# Patient Record
Sex: Female | Born: 1980 | Hispanic: Yes | Marital: Married | Smoking: Former smoker
Health system: Southern US, Community
[De-identification: ages and names within clinical notes are randomized; demographics above are authoritative.]

## PROBLEM LIST (undated history)

## (undated) ENCOUNTER — Inpatient Hospital Stay (HOSPITAL_COMMUNITY): Payer: Self-pay

## (undated) DIAGNOSIS — K219 Gastro-esophageal reflux disease without esophagitis: Secondary | ICD-10-CM

## (undated) DIAGNOSIS — K829 Disease of gallbladder, unspecified: Secondary | ICD-10-CM

## (undated) DIAGNOSIS — G629 Polyneuropathy, unspecified: Secondary | ICD-10-CM

## (undated) DIAGNOSIS — Z973 Presence of spectacles and contact lenses: Secondary | ICD-10-CM

## (undated) DIAGNOSIS — R7303 Prediabetes: Secondary | ICD-10-CM

## (undated) DIAGNOSIS — T8859XA Other complications of anesthesia, initial encounter: Secondary | ICD-10-CM

## (undated) DIAGNOSIS — G4730 Sleep apnea, unspecified: Secondary | ICD-10-CM

## (undated) DIAGNOSIS — R600 Localized edema: Secondary | ICD-10-CM

## (undated) DIAGNOSIS — Z8619 Personal history of other infectious and parasitic diseases: Secondary | ICD-10-CM

## (undated) DIAGNOSIS — A609 Anogenital herpesviral infection, unspecified: Secondary | ICD-10-CM

## (undated) DIAGNOSIS — D649 Anemia, unspecified: Secondary | ICD-10-CM

## (undated) DIAGNOSIS — D582 Other hemoglobinopathies: Secondary | ICD-10-CM

## (undated) DIAGNOSIS — F419 Anxiety disorder, unspecified: Secondary | ICD-10-CM

## (undated) DIAGNOSIS — I499 Cardiac arrhythmia, unspecified: Secondary | ICD-10-CM

## (undated) DIAGNOSIS — Z3490 Encounter for supervision of normal pregnancy, unspecified, unspecified trimester: Secondary | ICD-10-CM

## (undated) HISTORY — DX: Anogenital herpesviral infection, unspecified: A60.9

## (undated) HISTORY — PX: CARPAL TUNNEL RELEASE: SHX101

## (undated) HISTORY — DX: Other hemoglobinopathies: D58.2

## (undated) HISTORY — PX: UPPER GI ENDOSCOPY: SHX6162

## (undated) HISTORY — PX: LAPAROSCOPIC GASTRIC BANDING: SHX1100

## (undated) HISTORY — PX: COLONOSCOPY: SHX174

## (undated) HISTORY — DX: Polyneuropathy, unspecified: G62.9

## (undated) HISTORY — DX: Gastro-esophageal reflux disease without esophagitis: K21.9

## (undated) HISTORY — PX: WISDOM TOOTH EXTRACTION: SHX21

## (undated) HISTORY — DX: Personal history of other infectious and parasitic diseases: Z86.19

---

## 2001-03-26 ENCOUNTER — Other Ambulatory Visit: Admission: RE | Admit: 2001-03-26 | Discharge: 2001-03-26 | Payer: Self-pay | Admitting: Gynecology

## 2002-04-07 ENCOUNTER — Other Ambulatory Visit: Admission: RE | Admit: 2002-04-07 | Discharge: 2002-04-07 | Payer: Self-pay | Admitting: Obstetrics and Gynecology

## 2002-07-07 ENCOUNTER — Encounter: Payer: Self-pay | Admitting: Rheumatology

## 2002-07-07 ENCOUNTER — Encounter: Admission: RE | Admit: 2002-07-07 | Discharge: 2002-07-07 | Payer: Self-pay | Admitting: Rheumatology

## 2008-08-20 ENCOUNTER — Emergency Department (HOSPITAL_COMMUNITY): Admission: EM | Admit: 2008-08-20 | Discharge: 2008-08-20 | Payer: Self-pay | Admitting: Emergency Medicine

## 2009-05-02 ENCOUNTER — Emergency Department (HOSPITAL_COMMUNITY): Admission: EM | Admit: 2009-05-02 | Discharge: 2009-05-02 | Payer: Self-pay | Admitting: Family Medicine

## 2010-08-17 ENCOUNTER — Encounter
Admission: RE | Admit: 2010-08-17 | Discharge: 2010-08-17 | Payer: Self-pay | Source: Home / Self Care | Attending: Family Medicine | Admitting: Family Medicine

## 2010-11-23 ENCOUNTER — Other Ambulatory Visit (HOSPITAL_COMMUNITY): Payer: Self-pay | Admitting: Gastroenterology

## 2010-11-23 DIAGNOSIS — R1013 Epigastric pain: Secondary | ICD-10-CM

## 2010-12-07 ENCOUNTER — Ambulatory Visit (HOSPITAL_COMMUNITY)
Admission: RE | Admit: 2010-12-07 | Discharge: 2010-12-07 | Disposition: A | Discharge status: Home / Self Care | Payer: BC Managed Care – PPO | Source: Ambulatory Visit | Attending: Gastroenterology | Admitting: Gastroenterology

## 2010-12-07 ENCOUNTER — Other Ambulatory Visit (HOSPITAL_COMMUNITY): Payer: Self-pay | Admitting: Urology

## 2010-12-07 ENCOUNTER — Other Ambulatory Visit (HOSPITAL_COMMUNITY): Payer: Self-pay | Admitting: Gastroenterology

## 2010-12-07 ENCOUNTER — Encounter (HOSPITAL_COMMUNITY)
Admission: RE | Admit: 2010-12-07 | Discharge: 2010-12-07 | Disposition: A | Discharge status: Home / Self Care | Payer: BC Managed Care – PPO | Source: Ambulatory Visit | Attending: Gastroenterology | Admitting: Gastroenterology

## 2010-12-07 ENCOUNTER — Inpatient Hospital Stay (HOSPITAL_COMMUNITY): Admission: RE | Admit: 2010-12-07 | Payer: Self-pay | Source: Ambulatory Visit

## 2010-12-07 ENCOUNTER — Other Ambulatory Visit (HOSPITAL_COMMUNITY): Payer: Self-pay

## 2010-12-07 DIAGNOSIS — R1013 Epigastric pain: Secondary | ICD-10-CM

## 2010-12-07 DIAGNOSIS — R143 Flatulence: Secondary | ICD-10-CM | POA: Insufficient documentation

## 2010-12-07 DIAGNOSIS — R142 Eructation: Secondary | ICD-10-CM | POA: Insufficient documentation

## 2010-12-07 DIAGNOSIS — R141 Gas pain: Secondary | ICD-10-CM | POA: Insufficient documentation

## 2010-12-07 MED ORDER — SINCALIDE 5 MCG IJ SOLR: 0.0200 ug/kg | Freq: Once | INTRAMUSCULAR | Status: DC

## 2010-12-07 MED ORDER — TECHNETIUM TC 99M MEBROFENIN IV KIT
5.5000 | PACK | Freq: Once | INTRAVENOUS | Status: AC | PRN
  Administered 2010-12-07: 6 via INTRAVENOUS

## 2011-04-03 ENCOUNTER — Encounter: Payer: Self-pay | Admitting: Endocrinology

## 2011-04-03 ENCOUNTER — Ambulatory Visit (INDEPENDENT_AMBULATORY_CARE_PROVIDER_SITE_OTHER): Payer: BC Managed Care – PPO | Admitting: Endocrinology

## 2011-04-03 DIAGNOSIS — R002 Palpitations: Secondary | ICD-10-CM | POA: Insufficient documentation

## 2011-04-03 DIAGNOSIS — R739 Hyperglycemia, unspecified: Secondary | ICD-10-CM

## 2011-04-03 DIAGNOSIS — R7309 Other abnormal glucose: Secondary | ICD-10-CM

## 2011-04-03 MED ORDER — METFORMIN HCL ER 500 MG PO TB24: 500.0000 mg | ORAL_TABLET | Freq: Every day | ORAL | Status: DC

## 2011-04-03 NOTE — Patient Instructions (Addendum)
blood tests are being ordered for you today.  please call [phone] to hear your test results.  You will be prompted to enter the 9-digit "MRN" number that appears at the top left of this page, followed by #.  Then you will hear the message.  i agree with your plan for weight-loss surgery in [place].   i have sent a prescription to your pharmacy for "metformin." Please make a follow-up appointment in 6 months. CC michelle bigelman (update: i left message on phone-tree:  rx as we discussed)

## 2011-04-03 NOTE — Progress Notes (Signed)
  Subjective:    Patient ID: Karina [name], female    DOB: 1981-02-26, 30 y.o.   MRN: [id number]  HPI Pt says a recent a1c was 6.2%, 6 mos ago.  She says a repeat, 3 mos ago, was in the 5's. She reports few mos of slight weakness throughout the body, and assoc dizziness. Past Medical History  Diagnosis Date  . Asthma   . GERD (gastroesophageal reflux disease)   . Chronic bronchitis   . History of chicken pox     Past Surgical History  Procedure Date  . Carpal tunnel release 2008, 2010    History   Social History  . Marital Status: Divorced    Spouse Name: N/A    Number of Children: N/A  . Years of Education: college   Occupational History  . Not on file.   Social History Main Topics  . Smoking status: Former Games developer  . Smokeless tobacco: Not on file  . Alcohol Use: Yes  . Drug Use: No  . Sexually Active: Not on file   Other Topics Concern  . Not on file   Social History Narrative   PCP-Dr. Deirdre Priest in [place]   No current outpatient prescriptions on file prior to visit.   No Known Allergies  Family History  Problem Relation Age of Onset  . Diabetes Sister   . Heart disease Other   . Hyperlipidemia Other   . Hypertension Other   . Stroke Other   . Kidney disease Other   . Thyroid disease Other     BP 124/74  Pulse 81  Temp(Src) 98.8 F (37.1 C) (Oral)  Ht 5\' 5"  (1.651 m)  Wt 292 lb 1.9 oz (132.505 kg)  BMI 48.61 kg/m2  SpO2 95%  LMP 03/07/2011  Review of Systems denies blurry vision, chest pain, sob, n/v, urinary frequency, memory loss, depression, and hypoglycemia.  She has excessive diaphoresis, palpitations, excessive hair growth, weight gain, leg cramps, easy bruising, rhinorrhea, and headache.  She reports reg menses    Objective:   Physical Exam VS: see vs page GEN: no distress.  obese HEAD: head: no deformity eyes: no periorbital swelling, no proptosis external nose and ears are normal mouth: no lesion seen NECK: supple, thyroid is not  enlarged CHEST WALL: no deformity CV: reg rate and rhythm, no murmur ABD: abdomen is soft, nontender.  no hepatosplenomegaly.  not distended.  no hernia MUSCULOSKELETAL: muscle bulk and strength are grossly normal.  no obvious joint swelling.  gait is normal and steady EXTEMITIES: no deformity.  no ulcer on the feet.  feet are of normal color and temp.  Trace bilat leg edema PULSES: dorsalis pedis intact bilat.  no carotid bruit NEURO:  cn 2-12 grossly intact.   readily moves all 4's.  sensation is intact to touch on the feet SKIN:  Normal texture and temperature.  No rash or suspicious lesion is visible.  There is hirsutism on the face and anterior chest.   NODES:  None palpable at the neck. PSYCH: alert, oriented x3.  Does not appear anxious nor depressed.      Assessment & Plan:  Hyperglycemia.  She it at approx 10% annual risk of developing dm Palpitations, uncertain etiology.  New.  She is at risk for hyperthyroidism. Weakness, uncertain etiology

## 2011-04-06 ENCOUNTER — Other Ambulatory Visit (INDEPENDENT_AMBULATORY_CARE_PROVIDER_SITE_OTHER): Payer: BC Managed Care – PPO

## 2011-04-06 DIAGNOSIS — R002 Palpitations: Secondary | ICD-10-CM

## 2011-04-06 DIAGNOSIS — R7309 Other abnormal glucose: Secondary | ICD-10-CM

## 2011-04-06 DIAGNOSIS — R739 Hyperglycemia, unspecified: Secondary | ICD-10-CM

## 2011-04-06 LAB — HEMOGLOBIN A1C: Hgb A1c MFr Bld: 5.4 % (ref 4.6–6.5)

## 2011-06-01 ENCOUNTER — Ambulatory Visit: Payer: BC Managed Care – PPO | Admitting: Endocrinology

## 2011-06-11 ENCOUNTER — Other Ambulatory Visit (INDEPENDENT_AMBULATORY_CARE_PROVIDER_SITE_OTHER): Payer: BC Managed Care – PPO

## 2011-06-11 ENCOUNTER — Encounter: Payer: Self-pay | Admitting: Endocrinology

## 2011-06-11 ENCOUNTER — Ambulatory Visit (INDEPENDENT_AMBULATORY_CARE_PROVIDER_SITE_OTHER): Payer: BC Managed Care – PPO | Admitting: Endocrinology

## 2011-06-11 DIAGNOSIS — Z9884 Bariatric surgery status: Secondary | ICD-10-CM

## 2011-06-11 DIAGNOSIS — R739 Hyperglycemia, unspecified: Secondary | ICD-10-CM

## 2011-06-11 DIAGNOSIS — R002 Palpitations: Secondary | ICD-10-CM

## 2011-06-11 DIAGNOSIS — Z0000 Encounter for general adult medical examination without abnormal findings: Secondary | ICD-10-CM

## 2011-06-11 DIAGNOSIS — R7309 Other abnormal glucose: Secondary | ICD-10-CM

## 2011-06-11 LAB — CBC WITH DIFFERENTIAL/PLATELET
Basophils Relative: 0.6 % (ref 0.0–3.0)
Eosinophils Absolute: 0.7 10*3/uL (ref 0.0–0.7)
Hemoglobin: 12 g/dL (ref 12.0–15.0)
Lymphocytes Relative: 24.9 % (ref 12.0–46.0)
MCHC: 31.8 g/dL (ref 30.0–36.0)
Monocytes Relative: 5.9 % (ref 3.0–12.0)
Neutro Abs: 7.1 10*3/uL (ref 1.4–7.7)
RBC: 5.59 Mil/uL — ABNORMAL HIGH (ref 3.87–5.11)

## 2011-06-11 LAB — HEPATIC FUNCTION PANEL
AST: 22 U/L (ref 0–37)
Albumin: 3.7 g/dL (ref 3.5–5.2)
Alkaline Phosphatase: 108 U/L (ref 39–117)
Total Protein: 8.3 g/dL (ref 6.0–8.3)

## 2011-06-11 LAB — BASIC METABOLIC PANEL
Calcium: 8.7 mg/dL (ref 8.4–10.5)
Creatinine, Ser: 0.7 mg/dL (ref 0.4–1.2)
GFR: 96.58 mL/min (ref >60.00)

## 2011-06-11 LAB — VITAMIN B12: Vitamin B-12: 347 pg/mL (ref 211–911)

## 2011-06-11 LAB — MAGNESIUM: Magnesium: 2 mg/dL (ref 1.5–2.5)

## 2011-06-11 NOTE — Progress Notes (Signed)
  Subjective:    Patient ID: Karina [name], female    DOB: 07-Apr-1981, 30 y.o.   MRN: [id number]  HPI Pt is working towards gastric banding, which will be done in [place].  Pt says she got shaky on the metformin.  cbg was in the 60's with these sxs.  They resolved with eating.  Past Medical History  Diagnosis Date  . Asthma   . GERD (gastroesophageal reflux disease)   . Chronic bronchitis   . History of chicken pox     Past Surgical History  Procedure Date  . Carpal tunnel release 2008, 2010    History   Social History  . Marital Status: Divorced    Spouse Name: N/A    Number of Children: N/A  . Years of Education: college   Occupational History  . Not on file.   Social History Main Topics  . Smoking status: Former Games developer  . Smokeless tobacco: Not on file  . Alcohol Use: Yes  . Drug Use: No  . Sexually Active: Not on file   Other Topics Concern  . Not on file   Social History Narrative   PCP-Dr. Deirdre Priest in [place]    Current Outpatient Prescriptions on File Prior to Visit  Medication Sig Dispense Refill  . albuterol (PROVENTIL,VENTOLIN) 90 MCG/ACT inhaler Inhale 2 puffs into the lungs as needed.        . carisoprodol (SOMA) 250 MG tablet Take 350 mg by mouth as needed.        [name] esomeprazole (NEXIUM) 40 MG capsule Take 40 mg by mouth daily before breakfast.        . furosemide (LASIX) 40 MG tablet Take 40 mg by mouth 2 (two) times daily.        [name] ibuprofen (ADVIL,MOTRIN) 600 MG tablet Take 600 mg by mouth 3 (three) times daily as needed.        . iron polysaccharides (NIFEREX 60) 40-20 MG capsule Take 1 capsule by mouth daily.        . NON FORMULARY CPAP machine         No Known Allergies  Family History  Problem Relation Age of Onset  . Diabetes Sister   . Heart disease Other   . Hyperlipidemia Other   . Hypertension Other   . Stroke Other   . Kidney disease Other   . Thyroid disease Other     BP 118/78  Pulse 72  Temp(Src) 98.3 F (36.8 C)  (Oral)  Ht 5\' 5"  (1.651 m)  Wt 291 lb 9.6 oz (132.269 kg)  BMI 48.52 kg/m2  SpO2 98%  LMP 05/25/2011  Review of Systems Denies sob    Objective:   Physical Exam VITAL SIGNS:  See vs page GENERAL: no distress.  Obese Ext: no edema.     Assessment & Plan:  Hyperglycemia.  She did not tolerate metformin.  i agree with her plan for gastric banding.

## 2011-06-11 NOTE — Patient Instructions (Addendum)
i agree with your plan for weight-loss surgery in [place].   You can stop the metformin.   Please return here after your surgery. blood tests are being requested for you today.  please call [phone] to hear your test results.  You will be prompted to enter the 9-digit "MRN" number that appears at the top left of this page, followed by #.  Then you will hear the message.

## 2011-07-27 ENCOUNTER — Other Ambulatory Visit: Payer: Self-pay

## 2011-07-27 DIAGNOSIS — M79642 Pain in left hand: Secondary | ICD-10-CM

## 2011-07-27 DIAGNOSIS — M25532 Pain in left wrist: Secondary | ICD-10-CM

## 2011-08-01 ENCOUNTER — Inpatient Hospital Stay: Admission: RE | Admit: 2011-08-01 | Payer: BC Managed Care – PPO | Source: Ambulatory Visit

## 2011-08-01 ENCOUNTER — Inpatient Hospital Stay
Admission: RE | Admit: 2011-08-01 | Discharge: 2011-08-01 | Disposition: A | Discharge status: Home / Self Care | Payer: BC Managed Care – PPO | Source: Ambulatory Visit

## 2011-08-20 ENCOUNTER — Other Ambulatory Visit: Payer: BC Managed Care – PPO

## 2011-08-20 ENCOUNTER — Ambulatory Visit
Admission: RE | Admit: 2011-08-20 | Discharge: 2011-08-20 | Disposition: A | Discharge status: Home / Self Care | Payer: BC Managed Care – PPO | Source: Ambulatory Visit

## 2011-08-20 DIAGNOSIS — M25532 Pain in left wrist: Secondary | ICD-10-CM

## 2011-08-20 DIAGNOSIS — M79642 Pain in left hand: Secondary | ICD-10-CM

## 2011-08-29 ENCOUNTER — Telehealth: Payer: Self-pay | Admitting: *Deleted

## 2011-08-29 NOTE — Telephone Encounter (Signed)
Left message for pt informing of MD's advisement and to callback office.

## 2011-08-29 NOTE — Telephone Encounter (Signed)
Pt called regarding letter that was written by MD for lap band surgery in October. Pt states that she needs another letter stating that her blood sugars are well controlled, that she is currently taking Metformin to help with BS and the results of her last A1c test. (Letter needs to be faxed to Dr. Jones Skene'  In [place] fax [phone] office # [phone])

## 2011-08-29 NOTE — Telephone Encounter (Signed)
Last a1c was 5 mos ago.  Options are: Letter refers to that date, or: Come in for another a1c

## 2011-08-30 DIAGNOSIS — M958 Other specified acquired deformities of musculoskeletal system: Secondary | ICD-10-CM | POA: Insufficient documentation

## 2011-08-30 DIAGNOSIS — G8929 Other chronic pain: Secondary | ICD-10-CM | POA: Insufficient documentation

## 2011-08-30 NOTE — Telephone Encounter (Signed)
Pt states that she has had a recent A1c done by her GYN. She will fax results to office for MD to review and place into letter.

## 2011-09-14 NOTE — Telephone Encounter (Signed)
ok 

## 2011-09-15 ENCOUNTER — Encounter: Payer: Self-pay | Admitting: Endocrinology

## 2011-10-12 ENCOUNTER — Ambulatory Visit
Admission: RE | Admit: 2011-10-12 | Discharge: 2011-10-12 | Disposition: A | Discharge status: Home / Self Care | Payer: BC Managed Care – PPO | Source: Ambulatory Visit

## 2011-10-12 DIAGNOSIS — M79642 Pain in left hand: Secondary | ICD-10-CM

## 2011-10-12 DIAGNOSIS — M25532 Pain in left wrist: Secondary | ICD-10-CM

## 2011-10-24 ENCOUNTER — Other Ambulatory Visit: Payer: Self-pay | Admitting: Orthopedic Surgery

## 2011-10-24 DIAGNOSIS — M79642 Pain in left hand: Secondary | ICD-10-CM

## 2011-10-24 DIAGNOSIS — M25532 Pain in left wrist: Secondary | ICD-10-CM

## 2011-12-06 ENCOUNTER — Encounter (INDEPENDENT_AMBULATORY_CARE_PROVIDER_SITE_OTHER): Payer: Self-pay | Admitting: General Surgery

## 2011-12-06 ENCOUNTER — Ambulatory Visit (INDEPENDENT_AMBULATORY_CARE_PROVIDER_SITE_OTHER): Payer: BC Managed Care – PPO | Admitting: General Surgery

## 2011-12-06 VITALS — BP 122/84 | HR 76 | Ht 65.0 in | Wt 291.6 lb

## 2011-12-06 DIAGNOSIS — K828 Other specified diseases of gallbladder: Secondary | ICD-10-CM | POA: Insufficient documentation

## 2011-12-06 NOTE — Progress Notes (Signed)
HPI The patient is here for evaluation of biliary dyskinesia. She was seen back in April 2012. That time we were planning on performing a laparoscopic cholecystectomy however she wanted a simultaneous bariatric procedure. That was arranged but subsequently counseled and now she is coming back here for reevaluation for laparoscopic cholecystectomy  PE Her lung examination is clear to auscultation.  Cardiac exam regular rhythm and rate no murmurs gallops or surgeries.  Abdominal exam mild palpable tenderness in right upper quadrant and the epigastrium.  Assessment:  Symptomatic biliary dyskinesia.  Plan:.  Laparoscopic cholecystectomy with cholangiogram. The patient understands risks and then finished and wishes to proceed [] 

## 2012-01-04 ENCOUNTER — Encounter (HOSPITAL_COMMUNITY): Payer: Self-pay

## 2012-01-04 ENCOUNTER — Encounter (HOSPITAL_COMMUNITY)
Admission: RE | Admit: 2012-01-04 | Discharge: 2012-01-04 | Disposition: A | Discharge status: Home / Self Care | Payer: BC Managed Care – PPO | Source: Ambulatory Visit | Attending: General Surgery | Admitting: General Surgery

## 2012-01-04 HISTORY — DX: Localized edema: R60.0

## 2012-01-04 HISTORY — DX: Sleep apnea, unspecified: G47.30

## 2012-01-04 HISTORY — DX: Anemia, unspecified: D64.9

## 2012-01-04 LAB — BASIC METABOLIC PANEL
CO2: 25 mEq/L (ref 19–32)
Chloride: 103 mEq/L (ref 96–112)
Glucose, Bld: 86 mg/dL (ref 70–99)
Potassium: 4 mEq/L (ref 3.5–5.1)
Sodium: 136 mEq/L (ref 135–145)

## 2012-01-04 LAB — HCG, SERUM, QUALITATIVE: Preg, Serum: NEGATIVE

## 2012-01-04 LAB — SURGICAL PCR SCREEN
MRSA, PCR: NEGATIVE
Staphylococcus aureus: NEGATIVE

## 2012-01-04 LAB — CBC
HCT: 33.4 % — ABNORMAL LOW (ref 36.0–46.0)
Hemoglobin: 11.5 g/dL — ABNORMAL LOW (ref 12.0–15.0)
RBC: 5.33 MIL/uL — ABNORMAL HIGH (ref 3.87–5.11)
WBC: 12.8 10*3/uL — ABNORMAL HIGH (ref 4.0–10.5)

## 2012-01-04 NOTE — Pre-Procedure Instructions (Signed)
[address]  01/04/2012   Your procedure is scheduled on:  May 3,2013 @ 0930  Report to [facility] at 0730 AM.  Call this number if you have problems the morning of surgery: [phone]   Remember:   Do not eat food:After Midnight.  May have clear liquids: up to 4 Hours before arrival.  Clear liquids include soda, tea, black coffee, apple or grape juice, broth, water.  Take these medicines the morning of surgery with A SIP OF WATER: carafate, nexium, albuterol, soma (if needed)   Do not wear jewelry, make-up or nail polish.  Do not wear lotions, powders, or perfumes.  Do not shave 48 hours prior to surgery.  Do not bring valuables to the hospital.  Contacts, dentures or bridgework may not be worn into surgery.  Leave suitcase in the car. After surgery it may be brought to your room.  For patients admitted to the hospital, checkout time is 11:00 AM the day of discharge.   Patients discharged the day of surgery will not be allowed to drive home.    Special Instructions: CHG Shower Use Special Wash: 1/2 bottle night before surgery and 1/2 bottle morning of surgery.   Please read over the following fact sheets that you were given: Pain Booklet, Coughing and Deep Breathing, MRSA Information and Surgical Site Infection Prevention

## 2012-01-04 NOTE — Progress Notes (Addendum)
Medical physican Dr. Everardo All Ginette Otto Stress test, echo, ekg august 2012 - Dr. Josefine Class in [place] - requesting records Patient had tick bite April 22; on doxycycline prescription for this.

## 2012-01-09 ENCOUNTER — Telehealth (INDEPENDENT_AMBULATORY_CARE_PROVIDER_SITE_OTHER): Payer: Self-pay | Admitting: General Surgery

## 2012-01-10 ENCOUNTER — Encounter (INDEPENDENT_AMBULATORY_CARE_PROVIDER_SITE_OTHER): Payer: Self-pay

## 2012-01-10 MED ORDER — DEXTROSE 5 % IV SOLN
2.0000 g | INTRAVENOUS | Status: AC
  Administered 2012-01-11: 2 g via INTRAVENOUS
  Filled 2012-01-10: qty 2

## 2012-01-11 ENCOUNTER — Ambulatory Visit (HOSPITAL_COMMUNITY): Payer: BC Managed Care – PPO | Admitting: Anesthesiology

## 2012-01-11 ENCOUNTER — Encounter (HOSPITAL_COMMUNITY)
Admission: RE | Disposition: A | Discharge status: Home / Self Care | Payer: Self-pay | Source: Ambulatory Visit | Attending: General Surgery

## 2012-01-11 ENCOUNTER — Ambulatory Visit (HOSPITAL_COMMUNITY)
Admission: RE | Admit: 2012-01-11 | Discharge: 2012-01-11 | Disposition: A | Discharge status: Home / Self Care | Payer: BC Managed Care – PPO | Source: Ambulatory Visit | Attending: General Surgery | Admitting: General Surgery

## 2012-01-11 ENCOUNTER — Encounter (HOSPITAL_COMMUNITY): Payer: Self-pay | Admitting: *Deleted

## 2012-01-11 ENCOUNTER — Ambulatory Visit (HOSPITAL_COMMUNITY): Payer: BC Managed Care – PPO

## 2012-01-11 ENCOUNTER — Encounter (HOSPITAL_COMMUNITY): Payer: Self-pay | Admitting: Anesthesiology

## 2012-01-11 DIAGNOSIS — Z01812 Encounter for preprocedural laboratory examination: Secondary | ICD-10-CM | POA: Insufficient documentation

## 2012-01-11 DIAGNOSIS — K811 Chronic cholecystitis: Secondary | ICD-10-CM

## 2012-01-11 DIAGNOSIS — K828 Other specified diseases of gallbladder: Secondary | ICD-10-CM

## 2012-01-11 HISTORY — DX: Disease of gallbladder, unspecified: K82.9

## 2012-01-11 HISTORY — PX: CHOLECYSTECTOMY: SHX55

## 2012-01-11 LAB — GLUCOSE, CAPILLARY: Glucose-Capillary: 80 mg/dL (ref 70–99)

## 2012-01-11 SURGERY — LAPAROSCOPIC CHOLECYSTECTOMY WITH INTRAOPERATIVE CHOLANGIOGRAM
Anesthesia: General | Site: Abdomen | Wound class: Contaminated

## 2012-01-11 MED ORDER — 0.9 % SODIUM CHLORIDE (POUR BTL) OPTIME
TOPICAL | Status: DC | PRN
  Administered 2012-01-11: 1000 mL

## 2012-01-11 MED ORDER — BUPIVACAINE-EPINEPHRINE 0.25% -1:200000 IJ SOLN
INTRAMUSCULAR | Status: DC | PRN
  Administered 2012-01-11: 20 mL

## 2012-01-11 MED ORDER — NEOSTIGMINE METHYLSULFATE 1 MG/ML IJ SOLN
INTRAMUSCULAR | Status: DC | PRN
  Administered 2012-01-11: 4 mg via INTRAVENOUS

## 2012-01-11 MED ORDER — SODIUM CHLORIDE 0.9 % IV SOLN
INTRAVENOUS | Status: DC | PRN
  Administered 2012-01-11: 10:00:00

## 2012-01-11 MED ORDER — ONDANSETRON HCL 4 MG/2ML IJ SOLN: 4.0000 mg | Freq: Once | INTRAMUSCULAR | Status: DC | PRN

## 2012-01-11 MED ORDER — MIDAZOLAM HCL 5 MG/5ML IJ SOLN
INTRAMUSCULAR | Status: DC | PRN
  Administered 2012-01-11: 2 mg via INTRAVENOUS

## 2012-01-11 MED ORDER — LACTATED RINGERS IV SOLN
INTRAVENOUS | Status: DC
  Administered 2012-01-11 (×3): via INTRAVENOUS

## 2012-01-11 MED ORDER — FENTANYL CITRATE 0.05 MG/ML IJ SOLN
INTRAMUSCULAR | Status: DC | PRN
  Administered 2012-01-11 (×2): 50 ug via INTRAVENOUS
  Administered 2012-01-11: 100 ug via INTRAVENOUS
  Administered 2012-01-11: 50 ug via INTRAVENOUS

## 2012-01-11 MED ORDER — SODIUM CHLORIDE 0.9 % IR SOLN
Status: DC | PRN
  Administered 2012-01-11: 1

## 2012-01-11 MED ORDER — HYDROCODONE-ACETAMINOPHEN 5-325 MG PO TABS
1.0000 | ORAL_TABLET | Freq: Once | ORAL | Status: AC
  Administered 2012-01-11: 2 via ORAL
  Filled 2012-01-11: qty 2

## 2012-01-11 MED ORDER — HYDROCODONE-ACETAMINOPHEN 5-500 MG PO TABS: 1.0000 | ORAL_TABLET | Freq: Four times a day (QID) | ORAL | Status: DC | PRN

## 2012-01-11 MED ORDER — PROPOFOL 10 MG/ML IV EMUL
INTRAVENOUS | Status: DC | PRN
  Administered 2012-01-11: 200 mg via INTRAVENOUS

## 2012-01-11 MED ORDER — HYDROMORPHONE HCL PF 1 MG/ML IJ SOLN
0.2500 mg | INTRAMUSCULAR | Status: DC | PRN
  Administered 2012-01-11 (×4): 0.5 mg via INTRAVENOUS

## 2012-01-11 MED ORDER — DEXAMETHASONE SODIUM PHOSPHATE 4 MG/ML IJ SOLN
INTRAMUSCULAR | Status: DC | PRN
  Administered 2012-01-11: 8 mg via INTRAVENOUS

## 2012-01-11 MED ORDER — ROCURONIUM BROMIDE 100 MG/10ML IV SOLN
INTRAVENOUS | Status: DC | PRN
  Administered 2012-01-11: 50 mg via INTRAVENOUS
  Administered 2012-01-11: 10 mg via INTRAVENOUS

## 2012-01-11 MED ORDER — ONDANSETRON HCL 4 MG/2ML IJ SOLN
INTRAMUSCULAR | Status: DC | PRN
  Administered 2012-01-11: 4 mg via INTRAVENOUS

## 2012-01-11 MED ORDER — GLYCOPYRROLATE 0.2 MG/ML IJ SOLN
INTRAMUSCULAR | Status: DC | PRN
  Administered 2012-01-11: .8 mg via INTRAVENOUS

## 2012-01-11 MED ORDER — HYDROMORPHONE HCL PF 1 MG/ML IJ SOLN
INTRAMUSCULAR | Status: AC
  Filled 2012-01-11: qty 1

## 2012-01-11 SURGICAL SUPPLY — 45 items
APPLIER CLIP 5 13 M/L LIGAMAX5 (MISCELLANEOUS) ×2
APPLIER CLIP ROT 10 11.4 M/L (STAPLE)
BLADE SURG ROTATE 9660 (MISCELLANEOUS) IMPLANT
CANISTER SUCTION 2500CC (MISCELLANEOUS) ×2 IMPLANT
CHLORAPREP W/TINT 26ML (MISCELLANEOUS) ×2 IMPLANT
CLIP APPLIE 5 13 M/L LIGAMAX5 (MISCELLANEOUS) ×1 IMPLANT
CLIP APPLIE ROT 10 11.4 M/L (STAPLE) IMPLANT
CLOTH BEACON ORANGE TIMEOUT ST (SAFETY) ×2 IMPLANT
COVER MAYO STAND STRL (DRAPES) ×2 IMPLANT
COVER SURGICAL LIGHT HANDLE (MISCELLANEOUS) ×2 IMPLANT
DECANTER SPIKE VIAL GLASS SM (MISCELLANEOUS) IMPLANT
DERMABOND ADVANCED (GAUZE/BANDAGES/DRESSINGS) ×1
DERMABOND ADVANCED .7 DNX12 (GAUZE/BANDAGES/DRESSINGS) ×1 IMPLANT
DRAPE C-ARM 42X72 X-RAY (DRAPES) ×2 IMPLANT
DRAPE UTILITY 15X26 W/TAPE STR (DRAPE) ×4 IMPLANT
DRSG TEGADERM 4X4.75 (GAUZE/BANDAGES/DRESSINGS) ×2 IMPLANT
ELECT REM PT RETURN 9FT ADLT (ELECTROSURGICAL) ×2
ELECTRODE REM PT RTRN 9FT ADLT (ELECTROSURGICAL) ×1 IMPLANT
GLOVE BIOGEL PI IND STRL 7.0 (GLOVE) ×2 IMPLANT
GLOVE BIOGEL PI IND STRL 8 (GLOVE) ×1 IMPLANT
GLOVE BIOGEL PI INDICATOR 7.0 (GLOVE) ×2
GLOVE BIOGEL PI INDICATOR 8 (GLOVE) ×1
GLOVE ECLIPSE 6.5 STRL STRAW (GLOVE) ×2 IMPLANT
GLOVE ECLIPSE 7.5 STRL STRAW (GLOVE) ×2 IMPLANT
GLOVE SURG SS PI 7.0 STRL IVOR (GLOVE) ×4 IMPLANT
GOWN STRL NON-REIN LRG LVL3 (GOWN DISPOSABLE) ×6 IMPLANT
KIT BASIN OR (CUSTOM PROCEDURE TRAY) ×2 IMPLANT
KIT ROOM TURNOVER OR (KITS) ×2 IMPLANT
NS IRRIG 1000ML POUR BTL (IV SOLUTION) ×2 IMPLANT
PAD ARMBOARD 7.5X6 YLW CONV (MISCELLANEOUS) ×4 IMPLANT
POUCH SPECIMEN RETRIEVAL 10MM (ENDOMECHANICALS) ×2 IMPLANT
SCISSORS LAP 5X35 DISP (ENDOMECHANICALS) IMPLANT
SET CHOLANGIOGRAPH 5 50 .035 (SET/KITS/TRAYS/PACK) ×2 IMPLANT
SET IRRIG TUBING LAPAROSCOPIC (IRRIGATION / IRRIGATOR) ×2 IMPLANT
SLEEVE ENDOPATH XCEL 5M (ENDOMECHANICALS) ×4 IMPLANT
SPECIMEN JAR SMALL (MISCELLANEOUS) ×2 IMPLANT
STRIP CLOSURE SKIN 1/2X4 (GAUZE/BANDAGES/DRESSINGS) ×2 IMPLANT
SUT MNCRL AB 4-0 PS2 18 (SUTURE) ×2 IMPLANT
TOWEL OR 17X24 6PK STRL BLUE (TOWEL DISPOSABLE) ×2 IMPLANT
TOWEL OR 17X26 10 PK STRL BLUE (TOWEL DISPOSABLE) ×2 IMPLANT
TRAY LAPAROSCOPIC (CUSTOM PROCEDURE TRAY) ×2 IMPLANT
TROCAR XCEL BLUNT TIP 100MML (ENDOMECHANICALS) ×2 IMPLANT
TROCAR XCEL NON-BLD 11X100MML (ENDOMECHANICALS) ×2 IMPLANT
TROCAR XCEL NON-BLD 5MMX100MML (ENDOMECHANICALS) ×2 IMPLANT
WATER STERILE IRR 1000ML POUR (IV SOLUTION) IMPLANT

## 2012-01-11 NOTE — H&P (Signed)
  HPI  The patient is here for evaluation of biliary dyskinesia. She was seen back in April 2012. That time we were planning on performing a laparoscopic cholecystectomy however she wanted a simultaneous bariatric procedure. That was arranged but subsequently counseled and now she is coming back here for reevaluation for laparoscopic cholecystectomy  PE  Her lung examination is clear to auscultation.  Cardiac exam regular rhythm and rate no murmurs gallops or surgeries.  Abdominal exam mild palpable tenderness in right upper quadrant and the epigastrium.  Assessment:  Symptomatic biliary dyskinesia.  Plan:.  Laparoscopic cholecystectomy with cholangiogram. The patient understands risks and then finished and wishes to proceed    Morbidly obese patient with symptomatic cholecystitis.  Not a candidate for bariatric sleeve until after she has had  Her gallbladder removed.

## 2012-01-11 NOTE — Anesthesia Postprocedure Evaluation (Signed)
  Anesthesia Post-op Note  Patient: Karina [name]  Procedure(s) Performed: Procedure(s) (LRB): LAPAROSCOPIC CHOLECYSTECTOMY WITH INTRAOPERATIVE CHOLANGIOGRAM (N/A)  Patient Location: PACU  Anesthesia Type: General  Level of Consciousness: awake, alert  and oriented  Airway and Oxygen Therapy: Patient Spontanous Breathing  Post-op Pain: none  Post-op Assessment: Post-op Vital signs reviewed and Patient's Cardiovascular Status Stable  Post-op Vital Signs: stable  Complications: No apparent anesthesia complications

## 2012-01-11 NOTE — Transfer of Care (Signed)
Immediate Anesthesia Transfer of Care Note  Patient: Karina [name]  Procedure(s) Performed: Procedure(s) (LRB): LAPAROSCOPIC CHOLECYSTECTOMY WITH INTRAOPERATIVE CHOLANGIOGRAM (N/A)  Patient Location: PACU  Anesthesia Type: General  Level of Consciousness: awake  Airway & Oxygen Therapy: Patient Spontanous Breathing and Patient connected to nasal cannula oxygen  Post-op Assessment: Report given to PACU RN and Post -op Vital signs reviewed and stable  Post vital signs: stable  Complications: No apparent anesthesia complications

## 2012-01-11 NOTE — Op Note (Signed)
OPERATIVE REPORT  DATE OF OPERATION: 01/11/2012  PATIENT:  Karina [name]  31 y.o. female  PRE-OPERATIVE DIAGNOSIS:  symptomatic biliary dyskinesia  POST-OPERATIVE DIAGNOSIS:  symptomatic biliary dyskinesia  PROCEDURE:  Procedure(s): LAPAROSCOPIC CHOLECYSTECTOMY WITH INTRAOPERATIVE CHOLANGIOGRAM  SURGEON:  Surgeon(s): Cherylynn Ridges, MD  ASSISTANT: None  ANESTHESIA:   general  EBL: <20 ml  BLOOD ADMINISTERED: none  DRAINS: none   SPECIMEN:  Source of Specimen:  gallbladder  COUNTS CORRECT:  YES  PROCEDURE DETAILS: The patient was taken to the operating room and placed on the table in the supine position.  After an adequate endotracheal anesthetic was administered, she was prepped with ChloroPrep, and then draped in the usual manner exposing the entire abdomen laterally, inferiorly and up  to the costal margins.  After a proper timeout was performed including identifying the patient and the procedure to be performed, an infra-umbilical1.5cm midline incision was made using a #15 blade.  This was taken down to the fascia which was then incised with a #15 blade.  The edges of the fascia were tented up with Kocher clamps as the preperitoneal space was penetrated with a Kelly clamp into the peritoneum.  Once this was done, a pursestring suture of 0 Vicryl was passed around the fascial opening.  This was subsequently used to secure the [facility] cannula which was passed into the peritoneal cavity.  Once the [facility] cannula was in place, carbon dioxide gas was insufflated into the peritoneal cavity up to a maximal intra-abdominal pressure of 15mm Hg.The laparoscope, with attached camera and light source, was passed into the peritoneal cavity to visualize the direct insertion of two right upper quadrant 5mm cannulas, and a sup-xiphoid 11mm cannula.  Once all cannulas were in place, the dissection was begun.  Two ratcheted graspers were attached to the dome and infundibulum of the gallbladder and  retracted towards the anterior abdominal wall and the right upper quadrant.  Using cautery attached to a dissecting forceps, the peritoneum overlaying the triangle of Chalot and the hepatoduodenal triangle was dissected away exposing the cystic duct and the cystic artery.  A clip was placed on the gallbladder side of the cystic duct, then a cholecytodochotomy made using the laparoscopic scissors.  Through the cholecystodochotomy a Cook catheter was passed to performed a cholangiogram.  The cholangiogram showed good flow into the duodenum, good proximal filling, no intraductal filling defects, and no dilatation.  Once the cholangiogram was completed, the [facility] catheter was removed, and the distal cystic duct was clipped multiple times then transected.  The gallbladder was then dissected out of the hepatic bed without event.  It was retrieved from the abdomen (using an EndoCatch bag) without event.  Once the gallbladder was removed, the bed was inspected for hemostasis.  Once excellent hemostasis was obtained all gas and fluids were aspirated from above the liver, then the cannulas were removed.  The infra-umbilical incision was closed using the pursestring suture which was in place.  0.25% bupivicaine with epinephrine was injected at all sites.  All 10mm or greater cannula sites were close using a running subcuticular stitch of 4-0 Monocryl.  5.79mm cannula sites were closed with Dermabond only.Steri-Strips and Tagaderm were used to complete the dressings at all sites.  At this point all needle, sponge, and instrument counts were correct.The patient was awakened from anesthesia and taken to the PACU in stable condition.  PATIENT DISPOSITION:  PACU - hemodynamically stable.   Dolorez Jeffrey III,Ajamu Maxon O 5/3/201310:39 AM

## 2012-01-11 NOTE — Discharge Instructions (Signed)

## 2012-01-11 NOTE — Anesthesia Preprocedure Evaluation (Signed)
Anesthesia Evaluation  Patient identified by MRN, date of birth, ID band Patient awake    Reviewed: Allergy & Precautions, H&P , NPO status , Patient's Chart, lab work & pertinent test results  Airway Mallampati: II TM Distance: >3 FB Neck ROM: Full    Dental  (+) Teeth Intact   Pulmonary  breath sounds clear to auscultation        Cardiovascular Rhythm:Regular Rate:Normal     Neuro/Psych    GI/Hepatic   Endo/Other    Renal/GU      Musculoskeletal   Abdominal (+) + obese,  Abdomen: soft.    Peds  Hematology   Anesthesia Other Findings   Reproductive/Obstetrics                           Anesthesia Physical Anesthesia Plan  ASA: III  Anesthesia Plan: General   Post-op Pain Management:    Induction: Intravenous  Airway Management Planned: Oral ETT  Additional Equipment:   Intra-op Plan:   Post-operative Plan: Extubation in OR  Informed Consent: I have reviewed the patients History and Physical, chart, labs and discussed the procedure including the risks, benefits and alternatives for the proposed anesthesia with the patient or authorized representative who has indicated his/her understanding and acceptance.   Dental advisory given  Plan Discussed with:   Anesthesia Plan Comments: (Symptomatic Cholelithisis Type 2 DM glucose 73 Mild Asthma, Lungs clear Obesity scheduled for Lap Banding surgery 02/26/2012  Plan GA with ETT  Kipp Brood, MD)        Anesthesia Quick Evaluation

## 2012-01-14 ENCOUNTER — Telehealth (INDEPENDENT_AMBULATORY_CARE_PROVIDER_SITE_OTHER): Payer: Self-pay | Admitting: General Surgery

## 2012-01-14 ENCOUNTER — Encounter (HOSPITAL_COMMUNITY): Payer: Self-pay | Admitting: General Surgery

## 2012-01-14 NOTE — Telephone Encounter (Signed)
Pt calling in with post op update:  She is having some tightness in her chest while trying to do her C & DB.  She uses her inhaler and a pillow to splint her cough.  Pt reports she cannot move any phlegm.  She does not have a thermometer, but has no symptoms of fever.  Encouraged pt to continue same, and OK to increase pain meds to 2 at a time for adequate pain control if needed.

## 2012-01-15 ENCOUNTER — Telehealth (INDEPENDENT_AMBULATORY_CARE_PROVIDER_SITE_OTHER): Payer: Self-pay | Admitting: General Surgery

## 2012-01-15 ENCOUNTER — Other Ambulatory Visit (INDEPENDENT_AMBULATORY_CARE_PROVIDER_SITE_OTHER): Payer: Self-pay | Admitting: General Surgery

## 2012-01-15 NOTE — Telephone Encounter (Signed)
PT CALLED TO REPORT THAT SHE EXPERIENCED CHEST PAIN YESTERDAY EVENING/WITHOUT RADIATION/ ALSO THIS AM/ SOMETIMES WITH DEEP INSPIRATION. NO NAUSEA OR VOMITING, BOWELS ARE WORKING AND NO VOIDING ISSUES. SHE DIDI STATE SHE HAD SOME WHEEZING YESTERDAY. SHE CALLED DR. ELLISON'S OFICE AND THEY WOULD NOT SEE HER FOR THIS PROBLEM. TOLD TO CONTACT SURGEON. PT IS CONCERNED OTHER ISSUE IS GOING ON THAT MAY BE RELATED TO SURGERY. REVIEWED WITH DR. TOTH AND HE SAID WE COULD ORDER CT CHEST/ADB/PELVIS TO RULE OUT COMPLICATION/ PT OK WITH THIS. SHE WAS INSTRUCTED TO GO TO [facility] FOR SCAN TODAY/ [address]/GY

## 2012-01-16 ENCOUNTER — Ambulatory Visit (INDEPENDENT_AMBULATORY_CARE_PROVIDER_SITE_OTHER): Payer: BC Managed Care – PPO

## 2012-01-16 ENCOUNTER — Ambulatory Visit
Admission: RE | Admit: 2012-01-16 | Discharge: 2012-01-16 | Disposition: A | Discharge status: Home / Self Care | Payer: BC Managed Care – PPO | Source: Ambulatory Visit | Attending: General Surgery | Admitting: General Surgery

## 2012-01-16 DIAGNOSIS — IMO0001 Reserved for inherently not codable concepts without codable children: Secondary | ICD-10-CM

## 2012-01-16 DIAGNOSIS — Z4800 Encounter for change or removal of nonsurgical wound dressing: Secondary | ICD-10-CM

## 2012-01-16 MED ORDER — IOHEXOL 300 MG/ML  SOLN
125.0000 mL | Freq: Once | INTRAMUSCULAR | Status: AC | PRN
  Administered 2012-01-16: 125 mL via INTRAVENOUS

## 2012-01-16 NOTE — Progress Notes (Signed)
Patient walked in office concerned about incision. I removed tegaderm at 2 incision sites. The incisions were open and had a little bleeding but looked fine. NO redness or drainage other than spots of blood. Reapplied steri strips to area and out guaze over it. Told patient to pay attention to incisions and look for redness, warm to touch and yellow pus like drainage and to call if she sees any of them.

## 2012-01-17 ENCOUNTER — Telehealth (INDEPENDENT_AMBULATORY_CARE_PROVIDER_SITE_OTHER): Payer: Self-pay | Admitting: General Surgery

## 2012-01-17 NOTE — Telephone Encounter (Signed)
Pt calling for results of CT done 01/15/12.  She states she is still not feeling very good and chest is tight.

## 2012-01-18 ENCOUNTER — Telehealth (INDEPENDENT_AMBULATORY_CARE_PROVIDER_SITE_OTHER): Payer: Self-pay | Admitting: General Surgery

## 2012-01-18 NOTE — Telephone Encounter (Signed)
Patient calling back, states she was told MD would call with CT results and she has not heard anything. She is requesting a call and wants results faxed to [phone]. I told her that MD would need to review prior to this being faxed. Please call.

## 2012-01-22 ENCOUNTER — Other Ambulatory Visit (INDEPENDENT_AMBULATORY_CARE_PROVIDER_SITE_OTHER): Payer: Self-pay

## 2012-01-22 DIAGNOSIS — G8918 Other acute postprocedural pain: Secondary | ICD-10-CM

## 2012-01-22 MED ORDER — HYDROCODONE-ACETAMINOPHEN 5-500 MG PO TABS: 1.0000 | ORAL_TABLET | Freq: Four times a day (QID) | ORAL | Status: AC | PRN

## 2012-01-24 ENCOUNTER — Encounter (HOSPITAL_COMMUNITY): Payer: Self-pay | Admitting: Emergency Medicine

## 2012-01-24 ENCOUNTER — Encounter (HOSPITAL_COMMUNITY): Payer: Self-pay

## 2012-01-24 ENCOUNTER — Emergency Department (INDEPENDENT_AMBULATORY_CARE_PROVIDER_SITE_OTHER): Payer: BC Managed Care – PPO

## 2012-01-24 ENCOUNTER — Emergency Department (HOSPITAL_COMMUNITY): Payer: BC Managed Care – PPO

## 2012-01-24 ENCOUNTER — Emergency Department (HOSPITAL_COMMUNITY)
Admission: EM | Admit: 2012-01-24 | Discharge: 2012-01-24 | Disposition: A | Discharge status: Home / Self Care | Payer: BC Managed Care – PPO | Attending: Emergency Medicine | Admitting: Emergency Medicine

## 2012-01-24 ENCOUNTER — Emergency Department (HOSPITAL_COMMUNITY)
Admission: EM | Admit: 2012-01-24 | Discharge: 2012-01-24 | Disposition: A | Discharge status: Nursing Home (Includes ICF) | Payer: BC Managed Care – PPO | Source: Home / Self Care | Attending: Emergency Medicine | Admitting: Emergency Medicine

## 2012-01-24 DIAGNOSIS — Z9889 Other specified postprocedural states: Secondary | ICD-10-CM | POA: Insufficient documentation

## 2012-01-24 DIAGNOSIS — R0602 Shortness of breath: Secondary | ICD-10-CM | POA: Insufficient documentation

## 2012-01-24 DIAGNOSIS — R05 Cough: Secondary | ICD-10-CM | POA: Insufficient documentation

## 2012-01-24 DIAGNOSIS — J45909 Unspecified asthma, uncomplicated: Secondary | ICD-10-CM | POA: Insufficient documentation

## 2012-01-24 DIAGNOSIS — R079 Chest pain, unspecified: Secondary | ICD-10-CM

## 2012-01-24 DIAGNOSIS — R059 Cough, unspecified: Secondary | ICD-10-CM | POA: Insufficient documentation

## 2012-01-24 DIAGNOSIS — G4730 Sleep apnea, unspecified: Secondary | ICD-10-CM | POA: Insufficient documentation

## 2012-01-24 DIAGNOSIS — R071 Chest pain on breathing: Secondary | ICD-10-CM | POA: Insufficient documentation

## 2012-01-24 DIAGNOSIS — K219 Gastro-esophageal reflux disease without esophagitis: Secondary | ICD-10-CM | POA: Insufficient documentation

## 2012-01-24 DIAGNOSIS — D649 Anemia, unspecified: Secondary | ICD-10-CM | POA: Insufficient documentation

## 2012-01-24 DIAGNOSIS — R0682 Tachypnea, not elsewhere classified: Secondary | ICD-10-CM | POA: Insufficient documentation

## 2012-01-24 DIAGNOSIS — J42 Unspecified chronic bronchitis: Secondary | ICD-10-CM | POA: Insufficient documentation

## 2012-01-24 DIAGNOSIS — M549 Dorsalgia, unspecified: Secondary | ICD-10-CM | POA: Insufficient documentation

## 2012-01-24 LAB — POCT I-STAT, CHEM 8
Calcium, Ion: 1.18 mmol/L (ref 1.12–1.32)
Creatinine, Ser: 0.8 mg/dL (ref 0.50–1.10)
Glucose, Bld: 79 mg/dL (ref 70–99)
Hemoglobin: 12.2 g/dL (ref 12.0–15.0)
TCO2: 27 mmol/L (ref 0–100)

## 2012-01-24 MED ORDER — IOHEXOL 350 MG/ML SOLN
100.0000 mL | Freq: Once | INTRAVENOUS | Status: AC | PRN
  Administered 2012-01-24: 100 mL via INTRAVENOUS

## 2012-01-24 NOTE — ED Provider Notes (Signed)
History   This chart was scribed for Glynn Octave, MD by Charolett Bumpers . The patient was seen in room STRE8/STRE8.    CSN: [id number]  Arrival date & time 01/24/12  1626   First MD Initiated Contact with Patient 01/24/12 1703      Chief Complaint  Patient presents with  . Chest Pain    (Consider location/radiation/quality/duration/timing/severity/associated sxs/prior treatment) HPI Karina [name] is a 31 y.o. female who presents to the Emergency Department complaining of intermittent, moderate chest pain located slightly left of center for the past 2 weeks. Patient states that the chest pain radiates to back. Patient states that when she takes a deep breath, she feels a tightness for the past couple of weeks and told to go to Urgent Care or ER for continued pain. Patient also reports a mild, productive cough.  Patient states that she had her gallbladder removed on 5/3. Patient states that she had a CT pelvis, abd, and chest yesterday and told results were negative. Patient states that she went to Urgent Care today for the same symptoms. Patient reports a h/o asthma. Patient denies any h/o blood clots.   Past Medical History  Diagnosis Date  . Chronic bronchitis   . History of chicken pox   . Asthma     albuterol as needed  . Sleep apnea     cpap  . Diabetes mellitus     takes metformin daily  . Anemia     takes iron daily  . GERD (gastroesophageal reflux disease)     nexium   . Edema extremities     hx of edema in lower ext. takes lasix PRN last dose few weeks ago  . Gallbladder disease     Past Surgical History  Procedure Date  . Carpal tunnel release 2008, 2010  . Cholecystectomy 01/11/2012    Procedure: LAPAROSCOPIC CHOLECYSTECTOMY WITH INTRAOPERATIVE CHOLANGIOGRAM;  Surgeon: Cherylynn Ridges, MD;  Location: [facility] OR;  Service: General;  Laterality: N/A;    Family History  Problem Relation Age of Onset  . Diabetes Sister   . Heart disease Other   .  Hyperlipidemia Other   . Hypertension Other   . Stroke Other   . Kidney disease Other   . Thyroid disease Other   . Ovarian cancer Maternal Aunt   . Prostate cancer Maternal Uncle   . Cervical cancer Maternal Aunt   . Liver cancer Paternal Grandfather   . Liver cancer Paternal Uncle   . Anesthesia problems Mother   . Hypotension Neg Hx   . Malignant hyperthermia Neg Hx   . Pseudochol deficiency Neg Hx     History  Substance Use Topics  . Smoking status: Former Games developer  . Smokeless tobacco: Not on file  . Alcohol Use: Yes     socially    OB History    Grav Para Term Preterm Abortions TAB SAB Ect Mult Living                  Review of Systems  Constitutional: Negative for fever.  Respiratory: Positive for cough and shortness of breath.   Cardiovascular: Positive for chest pain.  Musculoskeletal: Positive for back pain.  All other systems reviewed and are negative.    Allergies  Review of patient's allergies indicates no known allergies.  Home Medications   Current Outpatient Rx  Name Route Sig Dispense Refill  . ALBUTEROL 90 MCG/ACT IN AERS Inhalation Inhale 2 puffs into the lungs as needed.  For shortness of breath    . CALCIUM-VITAMIN D-VITAMIN K [id number] MG-UNT-MCG PO CHEW Oral Chew 1 tablet by mouth 2 (two) times daily.    [name] CARISOPRODOL 250 MG PO TABS Oral Take 350 mg by mouth 2 (two) times daily as needed. For muscle spasms    . ESOMEPRAZOLE MAGNESIUM 40 MG PO CPDR Oral Take 40 mg by mouth daily before breakfast.      . FUROSEMIDE 40 MG PO TABS Oral Take 40 mg by mouth daily as needed. For increased swelling    . HYDROCODONE-ACETAMINOPHEN 5-500 MG PO TABS Oral Take 1 tablet by mouth every 6 (six) hours as needed for pain. 30 tablet 0  . IBUPROFEN 600 MG PO TABS Oral Take 600 mg by mouth 3 (three) times daily as needed. For pain    . FE BISGLYCINATE-FE POLYSAC 40-20 MG PO CAPS Oral Take 1 capsule by mouth daily.      [name] METFORMIN HCL ER (MOD) 500 MG PO TB24  Oral Take 500 mg by mouth daily with breakfast.    . ADULT MULTIVITAMIN W/MINERALS CH Oral Take 1 tablet by mouth daily.    . SUCRALFATE 1 GM/10ML PO SUSP Oral Take 1 g by mouth 4 (four) times daily.      BP 115/60  Pulse 69  Temp(Src) 98.1 F (36.7 C) (Oral)  Resp 18  SpO2 100%  LMP 01/01/2012  Physical Exam  Nursing note and vitals reviewed. Constitutional: She is oriented to person, place, and time. She appears well-developed and well-nourished. No distress.  HENT:  Head: Normocephalic and atraumatic.  Eyes: EOM are normal.  Neck: Neck supple. No tracheal deviation present.  Cardiovascular: Normal rate.   Pulmonary/Chest: Tachypnea (mildly) noted. No respiratory distress.       Chest pain is not reproducible.   Abdominal: Soft. There is no tenderness.       Well healed surgical incisions.   Musculoskeletal: Normal range of motion.  Neurological: She is alert and oriented to person, place, and time.  Skin: Skin is warm and dry.  Psychiatric: She has a normal mood and affect. Her behavior is normal.    ED Course  Procedures (including critical care time)  DIAGNOSTIC STUDIES: Oxygen Saturation is 100% on room air, normal by my interpretation.    COORDINATION OF CARE:  1713: Discussed planned course of treatment with the patient, who is agreeable at this time.     Labs Reviewed - No data to display Dg Chest 2 View  01/24/2012  *RADIOLOGY REPORT*  Clinical Data: Chest pain  CHEST - 2 VIEW  Comparison: 08/17/2010  Findings: Heart is normal in size but lungs are clear.  IMPRESSION: Negative.  Original Report Authenticated By: Donavan Burnet, M.D.     No diagnosis found.    MDM  Left-sided chest pain that is pleuritic for the past 10 days. Patient had cholecystectomy the beginning of May. She had a CT scan of her chest on May 8 that was not an angiogram.  Continues to have shortness of breath and pleuritic chest pain. X-ray negative urgent care.  CT angio chest  negative for PE.  No distress, lungs clear.    Date: 01/24/2012  Rate: 71  Rhythm: normal sinus rhythm  QRS Axis: normal  Intervals: normal  ST/T Wave abnormalities: normal  Conduction Disutrbances:none  Narrative Interpretation:   Old EKG Reviewed: none available    I personally performed the services described in this documentation, which was scribed in my presence.  The recorded information has been reviewed and considered.       Glynn Octave, MD 01/24/12 2131

## 2012-01-24 NOTE — ED Notes (Signed)
Sent here from ucc for chest pain, recent surgery to have gall bladder removed, pt sts sent for rule out blood clot.

## 2012-01-24 NOTE — ED Notes (Signed)
MD at bedside. 

## 2012-01-24 NOTE — ED Notes (Signed)
Pt was sent from Urgent care due to chest pain. Pt had Cholecystectomy on 01/11/2012. Pt is also c/o shortness of breath. Pt had outpatient CT of the chest, abdomen and pelvis last Wednesday but came back negative. EKG and Xray was done at Urgent Care but was unremarkable so was sent here for further evaluation.

## 2012-01-24 NOTE — Discharge Instructions (Signed)
Chest Pain (Nonspecific) There is no evidence of heart attack or blood clot in the lung. Followup with her doctor as scheduled. Return to the ED felt her worsening symptoms. It is often hard to give a specific diagnosis for the cause of chest pain. There is always a chance that your pain could be related to something serious, such as a heart attack or a blood clot in the lungs. You need to follow up with your caregiver for further evaluation. CAUSES   Heartburn.   Pneumonia or bronchitis.   Anxiety or stress.   Inflammation around your heart (pericarditis) or lung (pleuritis or pleurisy).   A blood clot in the lung.   A collapsed lung (pneumothorax). It can develop suddenly on its own (spontaneous pneumothorax) or from injury (trauma) to the chest.   Shingles infection (herpes zoster virus).  The chest wall is composed of bones, muscles, and cartilage. Any of these can be the source of the pain.  The bones can be bruised by injury.   The muscles or cartilage can be strained by coughing or overwork.   The cartilage can be affected by inflammation and become sore (costochondritis).  DIAGNOSIS  Lab tests or other studies, such as X-rays, electrocardiography, stress testing, or cardiac imaging, may be needed to find the cause of your pain.  TREATMENT   Treatment depends on what may be causing your chest pain. Treatment may include:   Acid blockers for heartburn.   Anti-inflammatory medicine.   Pain medicine for inflammatory conditions.   Antibiotics if an infection is present.   You may be advised to change lifestyle habits. This includes stopping smoking and avoiding alcohol, caffeine, and chocolate.   You may be advised to keep your head raised (elevated) when sleeping. This reduces the chance of acid going backward from your stomach into your esophagus.   Most of the time, nonspecific chest pain will improve within 2 to 3 days with rest and mild pain medicine.  HOME CARE  INSTRUCTIONS   If antibiotics were prescribed, take your antibiotics as directed. Finish them even if you start to feel better.   For the next few days, avoid physical activities that bring on chest pain. Continue physical activities as directed.   Do not smoke.   Avoid drinking alcohol.   Only take over-the-counter or prescription medicine for pain, discomfort, or fever as directed by your caregiver.   Follow your caregiver's suggestions for further testing if your chest pain does not go away.   Keep any follow-up appointments you made. If you do not go to an appointment, you could develop lasting (chronic) problems with pain. If there is any problem keeping an appointment, you must call to reschedule.  SEEK MEDICAL CARE IF:   You think you are having problems from the medicine you are taking. Read your medicine instructions carefully.   Your chest pain does not go away, even after treatment.   You develop a rash with blisters on your chest.  SEEK IMMEDIATE MEDICAL CARE IF:   You have increased chest pain or pain that spreads to your arm, neck, jaw, back, or abdomen.   You develop shortness of breath, an increasing cough, or you are coughing up blood.   You have severe back or abdominal pain, feel nauseous, or vomit.   You develop severe weakness, fainting, or chills.   You have a fever.  THIS IS AN EMERGENCY. Do not wait to see if the pain will go away. Get medical  help at once. Call your local emergency services (911 in [place] Do not drive yourself to the hospital. MAKE SURE YOU:   Understand these instructions.   Will watch your condition.   Will get help right away if you are not doing well or get worse.  Document Released: 06/06/2005 Document Revised: 08/16/2011 Document Reviewed: 04/01/2008 [facility] Patient Information 2012 [place], [place].

## 2012-01-24 NOTE — ED Notes (Signed)
For the past intermittent chest pain.  Pain is just slight left of center chest , intermittently pain may radiate through to the back, just left of center back.  Patient does not relate pain to any behavior.  Patient reports complaint was noticed since having surgery, 2 weeks ago tomorrow.  Patient reports having gallbladder removed.  Patient spoke to surgeon about this pain.  Told to go to ucc or er for continued pain.  Last wed had ct pelvis, abd and chest and told reults were negative. Patient also had a tick removed 4/24.

## 2012-01-24 NOTE — Discharge Instructions (Signed)
We have determined that your problem requires further evaluation in the emergency department.  We will take care of your transport there.  Once at the emergency department, you will be evaluated by a provider and they will order whatever treatment or tests they deem necessary.  We cannot guarantee that they will do any specific test or do any specific treatment.  ° °

## 2012-01-24 NOTE — ED Provider Notes (Signed)
Chief Complaint  Patient presents with  . Chest Pain    History of Present Illness:   [name] is a 31 year old female who presents today with a ten-day history of chest pain. She had a laparoscopic cholecystectomy on May 3 done by Dr. Lindie Spruce. About 3 days later she developed lower sternal chest pain radiating through the back. The pain would come and go lasting from 10-45 minutes at a time and would occur several times a day or sometimes she goes several days without any pain at all. The pain felt like a sharp pressure and was rated 10 over 10 in intensity. It sometimes wakes her up at night. Her chest feels tight and sometimes it feels like she can't get enough air. The pain sometimes feels pleuritic. She's felt chills and anorexia. Because of the pain, a week ago she had CT scans of her chest, abdomen, and pelvis. These were all normal. She denies any nausea or vomiting. She's had no fever. No constipation, diarrhea, or blood in the stool. No coughing or wheezing. No palpitations or syncope. The patient had a tick bite on April 24 and was given a round of doxycycline. There is a family history of coronary artery disease. Her mother developed coronary disease also had hypertension, CVAs, and clotting problems in her 82s. The patient notes a swelling of her legs for the past 3-4 years.  Review of Systems:  Other than noted above, the patient denies any of the following symptoms: Constitutional:  No fever, chills, fatigue, weight loss or anorexia. Lungs:  No cough or shortness of breath. Heart:  No chest pain, palpitations, syncope or edema.  No cardiac history. Abdomen:  No nausea, vomiting, hematememesis, melena, diarrhea, or hematochezia. GU:  No dysuria, frequency, urgency, or hematuria. Gyn:  No vaginal discharge, itching, abnormal bleeding or pelvic pain. Skin:  No rash or itching.  PMFSH:  Past medical history, family history, social history, meds, and allergies were reviewed.  No prior  abdominal surgeries, past history of GI problems, STDs or GYN problems.  No history of aspirin or NSAID use.  No excessive alcohol intake.  Physical Exam:   Vital signs:  BP 128/82  Pulse 77  Temp(Src) 98.6 F (37 C) (Oral)  Resp 18  SpO2 100%  LMP 01/01/2012 Gen:  Alert, oriented, in no distress. Lungs:  Breath sounds clear and equal bilaterally.  No wheezes, rales or rhonchi. Heart:  Regular rhythm.  No gallops or murmers.   Abdomen:  Abdomen was soft, flat, mild, generalized tenderness to palpation. All of her incisions from her cholecystectomy are healing up well. Bowel sounds are normal. No organomegaly or mass. Skin:  Clear, warm and dry.  No rash.   Date: 01/24/2012  Rate: 58  Rhythm: sinus arrhythmia  QRS Axis: normal  Intervals: normal  ST/T Wave abnormalities: normal  Conduction Disutrbances:none  Narrative Interpretation: Sinus bradycardia with sinus arrhythmia, otherwise normal EKG.  Old EKG Reviewed: none available    Radiology:  Dg Chest 2 View  01/24/2012  *RADIOLOGY REPORT*  Clinical Data: Chest pain  CHEST - 2 VIEW  Comparison: 08/17/2010  Findings: Heart is normal in size but lungs are clear.  IMPRESSION: Negative.  Original Report Authenticated By: Donavan Burnet, M.D.   Dg Cholangiogram Operative  01/11/2012  *RADIOLOGY REPORT*  Clinical data:  Laparoscopic cholecystectomy.  INTRAOPERATIVE CHOLANGIOGRAM 01/11/2012:  Comparison: None.  Findings:  A series of cholangiographic images from the C-arm fluoroscopic device were submitted for interpretation post- operatively.  The cannula is present in the cystic duct remnant with good opacification of the common bile duct, common hepatic duct, and proximal intrahepatic ducts.  The images are degraded by motion.  No filling defects to suggest retained bile duct stones. Excellent antegrade flow into the duodenum.  No extravasation.  The radiologic technologist documented 20 seconds of fluoroscopy time.  Please correlate with  findings at real time fluoroscopy.  IMPRESSION: Motion degraded intraoperative cholangiogram is negative for retained stones or obstruction.  These images were submitted for radiologic interpretation only. Please see the procedural report for the amount of contrast and the fluoroscopy time utilized.  Original Report Authenticated By: Arnell Sieving, M.D.   Ct Chest W Contrast  01/16/2012  *RADIOLOGY REPORT*  Clinical Data:  Abdominal tenderness, constipation, history cholecystectomy 01/11/2012.  CT CHEST, ABDOMEN AND PELVIS WITH CONTRAST  Technique:  Multidetector CT imaging of the chest, abdomen and pelvis was performed following the standard protocol during bolus administration of intravenous contrast.  Contrast: OMNIPAQUE IOHEXOL 300 MG/ML  SOLN,  Comparison:   None.  CT CHEST  Findings:  No axillary or supraclavicular lymphadenopathy.  No mediastinal or hilar lymphadenopathy.  No pericardial fluid. Esophagus is normal.  No pulmonary parenchymal abnormalities.  IMPRESSION: Unremarkable CT thorax.  CT ABDOMEN AND PELVIS  Findings:  Focal fatty infiltration along the falciform ligament. There is a low density 7 mm lesion in the right hepatic lobe (image 48) which is too small to characterize.  Prior cholecystectomy.  No evidence of fluid collection in the porta hepatis.  The pancreas, spleen, adrenal glands, and kidneys are normal.  The stomach, small bowel, appendix, and cecum are normal.  The colon and rectosigmoid colon are normal.  Abdominal aorta is normal caliber.  No retroperitoneal or periportal lymphadenopathy.  Uterus and ovaries are normal.  The bladder is normal.  No pelvic lymphadenopathy. Review of  bone windows demonstrates no aggressive osseous lesions.  IMPRESSION:  1.  No acute abdominal or pelvic findings. 2.  Cholecystectomy without complication. 3.  Small hypodensity in the right hepatic lobe cannot be fully characterize but is likely benign.  Original Report Authenticated By:  Genevive Bi, M.D.   Ct Abdomen Pelvis W Contrast  01/16/2012  *RADIOLOGY REPORT*  Clinical Data:  Abdominal tenderness, constipation, history cholecystectomy 01/11/2012.  CT CHEST, ABDOMEN AND PELVIS WITH CONTRAST  Technique:  Multidetector CT imaging of the chest, abdomen and pelvis was performed following the standard protocol during bolus administration of intravenous contrast.  Contrast: OMNIPAQUE IOHEXOL 300 MG/ML  SOLN,  Comparison:   None.  CT CHEST  Findings:  No axillary or supraclavicular lymphadenopathy.  No mediastinal or hilar lymphadenopathy.  No pericardial fluid. Esophagus is normal.  No pulmonary parenchymal abnormalities.  IMPRESSION: Unremarkable CT thorax.  CT ABDOMEN AND PELVIS  Findings:  Focal fatty infiltration along the falciform ligament. There is a low density 7 mm lesion in the right hepatic lobe (image 48) which is too small to characterize.  Prior cholecystectomy.  No evidence of fluid collection in the porta hepatis.  The pancreas, spleen, adrenal glands, and kidneys are normal.  The stomach, small bowel, appendix, and cecum are normal.  The colon and rectosigmoid colon are normal.  Abdominal aorta is normal caliber.  No retroperitoneal or periportal lymphadenopathy.  Uterus and ovaries are normal.  The bladder is normal.  No pelvic lymphadenopathy. Review of  bone windows demonstrates no aggressive osseous lesions.  IMPRESSION:  1.  No acute abdominal or pelvic findings.  2.  Cholecystectomy without complication. 3.  Small hypodensity in the right hepatic lobe cannot be fully characterize but is likely benign.  Original Report Authenticated By: Genevive Bi, M.D.    Assessment:  The encounter diagnosis was Chest pain.  Plan:   1.  The following meds were prescribed:   New Prescriptions   No medications on file   2.  The patient was sent to the emergency department via shuttle.  Reuben Likes, MD 01/24/12 [phone]

## 2012-01-29 ENCOUNTER — Telehealth: Payer: Self-pay | Admitting: *Deleted

## 2012-01-29 NOTE — Telephone Encounter (Signed)
Pt left message stating that she has had two recent CT scans and was told to have lab work to check kidney function before starting Metformin again. Left message for pt to callback office for clarification.

## 2012-01-29 NOTE — Telephone Encounter (Signed)
Informed pt that she would need to make OV with MD to F/U with him regarding medication since she hasn't been seen last year.

## 2012-02-01 ENCOUNTER — Encounter: Payer: Self-pay | Admitting: Endocrinology

## 2012-02-01 ENCOUNTER — Telehealth: Payer: Self-pay | Admitting: *Deleted

## 2012-02-01 ENCOUNTER — Ambulatory Visit (INDEPENDENT_AMBULATORY_CARE_PROVIDER_SITE_OTHER): Payer: BC Managed Care – PPO | Admitting: Endocrinology

## 2012-02-01 ENCOUNTER — Other Ambulatory Visit: Payer: Self-pay

## 2012-02-01 ENCOUNTER — Other Ambulatory Visit (INDEPENDENT_AMBULATORY_CARE_PROVIDER_SITE_OTHER): Payer: BC Managed Care – PPO

## 2012-02-01 VITALS — BP 102/64 | HR 65 | Temp 98.1°F | Ht 65.0 in | Wt 289.0 lb

## 2012-02-01 DIAGNOSIS — R7309 Other abnormal glucose: Secondary | ICD-10-CM

## 2012-02-01 DIAGNOSIS — R739 Hyperglycemia, unspecified: Secondary | ICD-10-CM

## 2012-02-01 DIAGNOSIS — D649 Anemia, unspecified: Secondary | ICD-10-CM | POA: Insufficient documentation

## 2012-02-01 DIAGNOSIS — D619 Aplastic anemia, unspecified: Secondary | ICD-10-CM

## 2012-02-01 LAB — IBC PANEL
Iron: 19 ug/dL — ABNORMAL LOW (ref 42–145)
Saturation Ratios: 5 % — ABNORMAL LOW (ref 20.0–50.0)
Transferrin: 271.4 mg/dL (ref 212.0–360.0)

## 2012-02-01 LAB — CBC WITH DIFFERENTIAL/PLATELET
Basophils Absolute: 0.1 10*3/uL (ref 0.0–0.1)
Eosinophils Absolute: 0.4 10*3/uL (ref 0.0–0.7)
Hemoglobin: 11.5 g/dL — ABNORMAL LOW (ref 12.0–15.0)
Lymphocytes Relative: 29.2 % (ref 12.0–46.0)
MCHC: 32.2 g/dL (ref 30.0–36.0)
Monocytes Relative: 6.2 % (ref 3.0–12.0)
Neutrophils Relative %: 60.6 % (ref 43.0–77.0)
Platelets: 265 10*3/uL (ref 150.0–400.0)
RDW: 18.5 % — ABNORMAL HIGH (ref 11.5–14.6)

## 2012-02-01 LAB — BASIC METABOLIC PANEL
BUN: 14 mg/dL (ref 6–23)
Calcium: 8.8 mg/dL (ref 8.4–10.5)
Chloride: 105 mEq/L (ref 96–112)
Creatinine, Ser: 0.8 mg/dL (ref 0.4–1.2)

## 2012-02-01 LAB — HEMOGLOBIN A1C: Hgb A1c MFr Bld: 5.2 % (ref 4.6–6.5)

## 2012-02-01 MED ORDER — GLUCOSE BLOOD VI STRP: ORAL_STRIP | Status: DC

## 2012-02-01 MED ORDER — FREESTYLE LANCETS MISC: Status: DC

## 2012-02-01 MED ORDER — CARISOPRODOL 250 MG PO TABS: 350.0000 mg | ORAL_TABLET | Freq: Two times a day (BID) | ORAL | Status: DC | PRN

## 2012-02-01 MED ORDER — ALBUTEROL SULFATE HFA 108 (90 BASE) MCG/ACT IN AERS: 2.0000 | INHALATION_SPRAY | Freq: Four times a day (QID) | RESPIRATORY_TRACT | Status: DC | PRN

## 2012-02-01 MED ORDER — FUROSEMIDE 40 MG PO TABS: 40.0000 mg | ORAL_TABLET | Freq: Every day | ORAL | Status: DC | PRN

## 2012-02-01 NOTE — Telephone Encounter (Signed)
Called pt to inform of lab results, left message to callback office (letter also mailed to pt).

## 2012-02-01 NOTE — Progress Notes (Signed)
Subjective:    Patient ID: Karina [name], female    DOB: [birth year], 31 y.o.   MRN: [id number]  HPI The state of at least three ongoing medical problems is addressed today: Pt returns for f/u of hyperglycemia.  She had to stop metformin for CT, and she needs bmet to resume Morbid obesity: Her gastric banding was delayed until after cholecystectomy (which was done a few weeks ago).  The gastric banding is scheduled to be done in [place] in 3 weeks.  pt states she feels well in general. She feels better since GB surgery.   Anemia: She has heavy menses. Past Medical History  Diagnosis Date  . Chronic bronchitis   . History of chicken pox   . Asthma     albuterol as needed  . Sleep apnea     cpap  . Diabetes mellitus     takes metformin daily  . Anemia     takes iron daily  . GERD (gastroesophageal reflux disease)     nexium   . Edema extremities     hx of edema in lower ext. takes lasix PRN last dose few weeks ago  . Gallbladder disease     Past Surgical History  Procedure Date  . Carpal tunnel release 2008, 2010  . Cholecystectomy 01/11/2012    Procedure: LAPAROSCOPIC CHOLECYSTECTOMY WITH INTRAOPERATIVE CHOLANGIOGRAM;  Surgeon: Cherylynn Ridges, MD;  Location: [facility] OR;  Service: General;  Laterality: N/A;    History   Social History  . Marital Status: Legally Separated    Spouse Name: N/A    Number of Children: N/A  . Years of Education: college   Occupational History  . Not on file.   Social History Main Topics  . Smoking status: Former Games developer  . Smokeless tobacco: Not on file  . Alcohol Use: Yes     socially  . Drug Use: No  . Sexually Active: Yes    Birth Control/ Protection: Condom   Other Topics Concern  . Not on file   Social History Narrative   PCP-Dr. Deirdre Priest in [place]    Current Outpatient Prescriptions on File Prior to Visit  Medication Sig Dispense Refill  . Calcium-Vitamin D-Vitamin K (CALCIUM + D) [id number] MG-UNT-MCG CHEW Chew 1 tablet by mouth 2  (two) times daily.      [name] esomeprazole (NEXIUM) 40 MG capsule Take 40 mg by mouth daily before breakfast.        . furosemide (LASIX) 40 MG tablet Take 1 tablet (40 mg total) by mouth daily as needed. For increased swelling  30 tablet  0  . HYDROcodone-acetaminophen (VICODIN) 5-500 MG per tablet Take 1 tablet by mouth every 6 (six) hours as needed for pain.  30 tablet  0  . ibuprofen (ADVIL,MOTRIN) 600 MG tablet Take 600 mg by mouth 3 (three) times daily as needed. For pain      . iron polysaccharides (NIFEREX 60) 40-20 MG capsule Take 1 capsule by mouth daily.        . Multiple Vitamin (MULITIVITAMIN WITH MINERALS) TABS Take 1 tablet by mouth daily.      . sucralfate (CARAFATE) 1 GM/10ML suspension Take 1 g by mouth 4 (four) times daily.      [name] albuterol (PROVENTIL HFA;VENTOLIN HFA) 108 (90 BASE) MCG/ACT inhaler Inhale 2 puffs into the lungs every 6 (six) hours as needed for wheezing.  1 Inhaler  0  . metFORMIN (GLUMETZA) 500 MG (MOD) 24 hr tablet Take 500 mg by  mouth daily with breakfast.        No Known Allergies  Family History  Problem Relation Age of Onset  . Diabetes Sister   . Heart disease Other   . Hyperlipidemia Other   . Hypertension Other   . Stroke Other   . Kidney disease Other   . Thyroid disease Other   . Ovarian cancer Maternal Aunt   . Prostate cancer Maternal Uncle   . Cervical cancer Maternal Aunt   . Liver cancer Paternal Grandfather   . Liver cancer Paternal Uncle   . Anesthesia problems Mother   . Hypotension Neg Hx   . Malignant hyperthermia Neg Hx   . Pseudochol deficiency Neg Hx     BP 102/64  Pulse 65  Temp(Src) 98.1 F (36.7 C) (Oral)  Ht 5\' 5"  (1.651 m)  Wt 289 lb (131.09 kg)  BMI 48.09 kg/m2  SpO2 98%  LMP 01/01/2012    Review of Systems  Gastrointestinal: Negative for anal bleeding.  Genitourinary: Negative for hematuria.   Denies weight change.      Objective:   Physical Exam VITAL SIGNS:  See vs page GENERAL: no distress.   Morbid obesity.   ABDOMEN: abdomen is soft, nontender.  no hepatosplenomegaly. not distended.  no hernia.  Healing surgical scars.   Lab Results  Component Value Date   WBC 12.7* 02/01/2012   HGB 11.5* 02/01/2012   HCT 35.6* 02/01/2012   PLT 265.0 02/01/2012   GLUCOSE 80 02/01/2012   ALT 30 06/11/2011   AST 22 06/11/2011   NA 137 02/01/2012   K 4.2 02/01/2012   CL 105 02/01/2012   CREATININE 0.8 02/01/2012   BUN 14 02/01/2012   CO2 26 02/01/2012   TSH 1.01 04/06/2011   HGBA1C 5.2 02/01/2012      Assessment & Plan:  Hyperglycemia, well-controlled Anemia, needs increased rx Morbid obesity.  She is ready for gastric banding.

## 2012-02-01 NOTE — Patient Instructions (Signed)
blood tests are being requested for you today.  You will receive a letter with results.   Please come back for a follow-up appointment in 6 months. 

## 2012-02-01 NOTE — Telephone Encounter (Signed)
Pt informed of lab results. She wants to know if she wants to resume taking her Metformin.

## 2012-02-01 NOTE — Telephone Encounter (Signed)
Yes, please do.

## 2012-02-01 NOTE — Telephone Encounter (Signed)
Pt informed of MD's advisement. 

## 2012-02-05 ENCOUNTER — Encounter (INDEPENDENT_AMBULATORY_CARE_PROVIDER_SITE_OTHER): Payer: Self-pay | Admitting: General Surgery

## 2012-02-05 ENCOUNTER — Ambulatory Visit (INDEPENDENT_AMBULATORY_CARE_PROVIDER_SITE_OTHER): Payer: BC Managed Care – PPO | Admitting: General Surgery

## 2012-02-05 VITALS — BP 122/76 | Temp 97.6°F | Ht 65.0 in | Wt 290.0 lb

## 2012-02-05 DIAGNOSIS — Z09 Encounter for follow-up examination after completed treatment for conditions other than malignant neoplasm: Secondary | ICD-10-CM

## 2012-02-05 NOTE — Progress Notes (Signed)
HPI The patient is status post laparoscopic cholecystectomy for biliary dyskinesia. She is doing well. Initially she had some chest discomfort and tightness but this has resolved. She also had some shortness of breath. All of this has resolved. She did not require going back to the emergency room.  PE On examination her wounds have healed well with no evidence of infection. There was a small amount of Dermabond had dried and the umbilicus which are removed. There was no evidence of hernia.  Studiy review None.  Assessment Going well status post laparoscopic cholecystectomy.  Plan Return to see me on a p.r.n. basis.

## 2012-02-11 ENCOUNTER — Telehealth: Payer: Self-pay

## 2012-02-11 MED ORDER — ONETOUCH ULTRASOFT LANCETS MISC: Status: AC

## 2012-02-11 MED ORDER — GLUCOSE BLOOD VI STRP: ORAL_STRIP | Status: AC

## 2012-02-11 NOTE — Telephone Encounter (Signed)
Pt called stating that her insurance company will not cover Freestyle glucometer and supplies. Rxs changed to Onetouch. Pt will bring back Freestyle monitor for Onetouch.

## 2012-03-19 ENCOUNTER — Ambulatory Visit: Payer: BC Managed Care – PPO | Admitting: Internal Medicine

## 2012-04-14 ENCOUNTER — Other Ambulatory Visit: Payer: Self-pay | Admitting: Endocrinology

## 2012-08-20 ENCOUNTER — Other Ambulatory Visit: Payer: Self-pay | Admitting: Obstetrics and Gynecology

## 2012-08-21 ENCOUNTER — Encounter (HOSPITAL_COMMUNITY): Payer: Self-pay | Admitting: Emergency Medicine

## 2012-08-21 ENCOUNTER — Emergency Department (HOSPITAL_COMMUNITY)
Admission: EM | Admit: 2012-08-21 | Discharge: 2012-08-21 | Disposition: A | Discharge status: Home / Self Care | Payer: No Typology Code available for payment source | Attending: Emergency Medicine | Admitting: Emergency Medicine

## 2012-08-21 DIAGNOSIS — J45909 Unspecified asthma, uncomplicated: Secondary | ICD-10-CM | POA: Insufficient documentation

## 2012-08-21 DIAGNOSIS — D649 Anemia, unspecified: Secondary | ICD-10-CM | POA: Insufficient documentation

## 2012-08-21 DIAGNOSIS — K219 Gastro-esophageal reflux disease without esophagitis: Secondary | ICD-10-CM | POA: Insufficient documentation

## 2012-08-21 DIAGNOSIS — Z8709 Personal history of other diseases of the respiratory system: Secondary | ICD-10-CM | POA: Insufficient documentation

## 2012-08-21 DIAGNOSIS — G4730 Sleep apnea, unspecified: Secondary | ICD-10-CM | POA: Insufficient documentation

## 2012-08-21 DIAGNOSIS — Z87891 Personal history of nicotine dependence: Secondary | ICD-10-CM | POA: Insufficient documentation

## 2012-08-21 DIAGNOSIS — E860 Dehydration: Secondary | ICD-10-CM

## 2012-08-21 DIAGNOSIS — Z8719 Personal history of other diseases of the digestive system: Secondary | ICD-10-CM | POA: Insufficient documentation

## 2012-08-21 DIAGNOSIS — E119 Type 2 diabetes mellitus without complications: Secondary | ICD-10-CM | POA: Insufficient documentation

## 2012-08-21 DIAGNOSIS — Z79899 Other long term (current) drug therapy: Secondary | ICD-10-CM | POA: Insufficient documentation

## 2012-08-21 LAB — CBC WITH DIFFERENTIAL/PLATELET
Eosinophils Absolute: 1.1 10*3/uL — ABNORMAL HIGH (ref 0.0–0.7)
HCT: 33 % — ABNORMAL LOW (ref 36.0–46.0)
Hemoglobin: 11.3 g/dL — ABNORMAL LOW (ref 12.0–15.0)
Lymphs Abs: 3.3 10*3/uL (ref 0.7–4.0)
MCH: 21.9 pg — ABNORMAL LOW (ref 26.0–34.0)
Monocytes Relative: 6 % (ref 3–12)
Neutrophils Relative %: 54 % (ref 43–77)
RBC: 5.17 MIL/uL — ABNORMAL HIGH (ref 3.87–5.11)

## 2012-08-21 LAB — COMPREHENSIVE METABOLIC PANEL
ALT: 41 U/L — ABNORMAL HIGH (ref 0–35)
Alkaline Phosphatase: 131 U/L — ABNORMAL HIGH (ref 39–117)
CO2: 27 mEq/L (ref 19–32)
GFR calc Af Amer: >90 mL/min (ref >90)
GFR calc non Af Amer: >90 mL/min (ref >90)
Glucose, Bld: 80 mg/dL (ref 70–99)
Potassium: 4.1 mEq/L (ref 3.5–5.1)
Sodium: 134 mEq/L — ABNORMAL LOW (ref 135–145)

## 2012-08-21 MED ORDER — SODIUM CHLORIDE 0.9 % IV BOLUS (SEPSIS)
1000.0000 mL | Freq: Once | INTRAVENOUS | Status: AC
  Administered 2012-08-21: 1000 mL via INTRAVENOUS

## 2012-08-21 MED ORDER — PROMETHAZINE HCL 25 MG PO TABS: 25.0000 mg | ORAL_TABLET | Freq: Four times a day (QID) | ORAL | Status: DC | PRN

## 2012-08-21 MED ORDER — PANTOPRAZOLE SODIUM 40 MG IV SOLR
40.0000 mg | Freq: Once | INTRAVENOUS | Status: AC
  Administered 2012-08-21: 40 mg via INTRAVENOUS
  Filled 2012-08-21: qty 40

## 2012-08-21 MED ORDER — ONDANSETRON HCL 4 MG/2ML IJ SOLN
4.0000 mg | Freq: Once | INTRAMUSCULAR | Status: AC
  Administered 2012-08-21: 4 mg via INTRAVENOUS
  Filled 2012-08-21: qty 2

## 2012-08-21 NOTE — ED Notes (Signed)
Patient was seen yesterday and had symptoms of dehydration (leg cramps, not sweating, low fluid intake, occasional diarrhea) and gynecologist sent her here to be evaluated.  No labs were drawn at her GYN's office.

## 2012-08-21 NOTE — ED Notes (Signed)
Pt states she had a lap band surgery in June, states she has to take her time eating and drinking but sometimes at work she doesn't get time to do that, states she feels dehydrated, been having dry mouth and skin feeling dry. Pt states having nausea and diarrhea, denies vomiting. Denies pain at this time, states sometimes has pain where incision sites were.

## 2012-08-21 NOTE — ED Provider Notes (Signed)
History     CSN: [id number]  Arrival date & time 08/21/12  0915   First MD Initiated Contact with Patient 08/21/12 0945      Chief Complaint  Patient presents with  . Dehydration    (Consider location/radiation/quality/duration/timing/severity/associated sxs/prior treatment) HPI.... status post lap banding gastric bypass 02/25/2012 in [place].  Now with persistent diarrhea, leg cramps, decreased diaphoresis for 24 hours..  Patient thinks she is dehydrated.  Severity is mild.  Nothing makes symptoms better or worse  Past Medical History  Diagnosis Date  . Chronic bronchitis   . History of chicken pox   . Asthma     albuterol as needed  . Sleep apnea     cpap  . Diabetes mellitus     takes metformin daily  . Anemia     takes iron daily  . GERD (gastroesophageal reflux disease)     nexium   . Edema extremities     hx of edema in lower ext. takes lasix PRN last dose few weeks ago  . Gallbladder disease     Past Surgical History  Procedure Date  . Carpal tunnel release 2008, 2010  . Cholecystectomy 01/11/2012    Procedure: LAPAROSCOPIC CHOLECYSTECTOMY WITH INTRAOPERATIVE CHOLANGIOGRAM;  Surgeon: Cherylynn Ridges, MD;  Location: [facility] OR;  Service: General;  Laterality: N/A;    Family History  Problem Relation Age of Onset  . Diabetes Sister   . Heart disease Other   . Hyperlipidemia Other   . Hypertension Other   . Stroke Other   . Kidney disease Other   . Thyroid disease Other   . Ovarian cancer Maternal Aunt   . Prostate cancer Maternal Uncle   . Cervical cancer Maternal Aunt   . Liver cancer Paternal Grandfather   . Liver cancer Paternal Uncle   . Anesthesia problems Mother   . Hypotension Neg Hx   . Malignant hyperthermia Neg Hx   . Pseudochol deficiency Neg Hx     History  Substance Use Topics  . Smoking status: Former Games developer  . Smokeless tobacco: Not on file  . Alcohol Use: Yes     Comment: socially    OB History    Grav Para Term Preterm Abortions TAB SAB  Ect Mult Living                  Review of Systems  All other systems reviewed and are negative.    Allergies  Review of patient's allergies indicates no known allergies.  Home Medications   Current Outpatient Rx  Name  Route  Sig  Dispense  Refill  . ALBUTEROL SULFATE HFA 108 (90 BASE) MCG/ACT IN AERS   Inhalation   Inhale 2 puffs into the lungs every 6 (six) hours as needed for wheezing.   1 Inhaler   0   . CALCIUM-VITAMIN D-VITAMIN K [phone] MG-UNT-MCG PO CHEW   Oral   Chew 1 tablet by mouth 2 (two) times daily.         [name] CITALOPRAM HYDROBROMIDE 10 MG PO TABS   Oral   Take 10 mg by mouth daily.         [name] ESOMEPRAZOLE MAGNESIUM 40 MG PO CPDR   Oral   Take 40 mg by mouth daily before breakfast.           . FUROSEMIDE 40 MG PO TABS   Oral   Take 1 tablet (40 mg total) by mouth daily as needed. For increased swelling  30 tablet   0   . FE BISGLYCINATE-FE POLYSAC 40-20 MG PO CAPS   Oral   Take 1 capsule by mouth daily.           [name] METFORMIN HCL ER 500 MG PO TB24   Oral   Take 500 mg by mouth every other day.         . ADULT MULTIVITAMIN W/MINERALS CH   Oral   Take 1 tablet by mouth daily.         [name] GLUCOSE BLOOD VI STRP      Use as instructed   100 each   12   . ONETOUCH ULTRASOFT LANCETS MISC      Use as instructed   100 each   12   . PROMETHAZINE HCL 25 MG PO TABS   Oral   Take 1 tablet (25 mg total) by mouth every 6 (six) hours as needed for nausea.   20 tablet   0     BP 134/75  Pulse 83  Temp 97.7 F (36.5 C) (Oral)  Resp 16  SpO2 100%  LMP 07/31/2012  Physical Exam  Nursing note and vitals reviewed. Constitutional: She is oriented to person, place, and time. She appears well-developed and well-nourished.  HENT:  Head: Normocephalic and atraumatic.  Eyes: Conjunctivae normal and EOM are normal. Pupils are equal, round, and reactive to light.  Neck: Normal range of motion. Neck supple.  Cardiovascular: Normal  rate, regular rhythm and normal heart sounds.   Pulmonary/Chest: Effort normal and breath sounds normal.  Abdominal: Soft. Bowel sounds are normal.  Musculoskeletal: Normal range of motion.  Neurological: She is alert and oriented to person, place, and time.  Skin: Skin is warm and dry.  Psychiatric: She has a normal mood and affect.    ED Course  Procedures (including critical care time)  Labs Reviewed  CBC WITH DIFFERENTIAL - Abnormal; Notable for the following:    WBC 10.9 (*)     RBC 5.17 (*)     Hemoglobin 11.3 (*)     HCT 33.0 (*)     MCV 63.8 (*)     MCH 21.9 (*)     RDW 16.5 (*)     Eosinophils Relative 10 (*)     Eosinophils Absolute 1.1 (*)     All other components within normal limits  COMPREHENSIVE METABOLIC PANEL - Abnormal; Notable for the following:    Sodium 134 (*)     Albumin 3.2 (*)     ALT 41 (*)     Alkaline Phosphatase 131 (*)     All other components within normal limits  LIPASE, BLOOD   No results found.   1. Dehydration       MDM  Patient is nontoxic. She feels better after IV fluids. We discussed elevated liver functions. She'll followup with gastroenterology next week. Discharge meds Phenergan 25 mg #20        Donnetta Hutching, MD 08/21/12 1240

## 2012-08-27 ENCOUNTER — Telehealth: Payer: Self-pay | Admitting: Endocrinology

## 2012-08-27 NOTE — Telephone Encounter (Signed)
Forward 3 pages from [facility] to Dr. Romero Belling for review on 08-27-12 ym

## 2012-08-29 ENCOUNTER — Telehealth: Payer: Self-pay | Admitting: Endocrinology

## 2012-08-29 NOTE — Telephone Encounter (Signed)
Forward 2 pages from [employer] [facility] to Dr. Romero Belling for review on 08-29-12 ym

## 2012-12-24 ENCOUNTER — Encounter (HOSPITAL_COMMUNITY): Payer: Self-pay | Admitting: Emergency Medicine

## 2012-12-24 ENCOUNTER — Emergency Department (INDEPENDENT_AMBULATORY_CARE_PROVIDER_SITE_OTHER)
Admission: EM | Admit: 2012-12-24 | Discharge: 2012-12-24 | Disposition: A | Discharge status: Home / Self Care | Payer: PRIVATE HEALTH INSURANCE | Source: Home / Self Care | Attending: Emergency Medicine | Admitting: Emergency Medicine

## 2012-12-24 DIAGNOSIS — J45909 Unspecified asthma, uncomplicated: Secondary | ICD-10-CM

## 2012-12-24 DIAGNOSIS — J069 Acute upper respiratory infection, unspecified: Secondary | ICD-10-CM

## 2012-12-24 DIAGNOSIS — J209 Acute bronchitis, unspecified: Secondary | ICD-10-CM

## 2012-12-24 DIAGNOSIS — J019 Acute sinusitis, unspecified: Secondary | ICD-10-CM

## 2012-12-24 MED ORDER — HYDROCODONE-ACETAMINOPHEN 5-325 MG PO TABS: ORAL_TABLET | ORAL | Status: DC

## 2012-12-24 MED ORDER — HYDROCOD POLST-CHLORPHEN POLST 10-8 MG/5ML PO LQCR: 5.0000 mL | Freq: Two times a day (BID) | ORAL | Status: DC | PRN

## 2012-12-24 MED ORDER — AMOXICILLIN 500 MG PO CAPS: 1000.0000 mg | ORAL_CAPSULE | Freq: Three times a day (TID) | ORAL | Status: DC

## 2012-12-24 MED ORDER — PREDNISONE 20 MG PO TABS: 20.0000 mg | ORAL_TABLET | Freq: Two times a day (BID) | ORAL | Status: DC

## 2012-12-24 NOTE — ED Provider Notes (Signed)
Chief Complaint:   Chief Complaint  Patient presents with  . URI    History of Present Illness:   Karina [name] is a 32 year old female who has had a 2 day history of cough productive green sputum, wheezing, sweats, chills, headache, aching in her chest, hoarseness, sore throat, feels dizzy, has had nasal congestion, rhinorrhea with yellow drainage, and congestion in her ears. She has a lifelong history of asthma which is mild and intermittent. She has controlled this with as needed use of albuterol inhaler. She denies fever or GI symptoms.  Review of Systems:  Other than noted above, the patient denies any of the following symptoms. Systemic:  No fever, chills, sweats, fatigue, myalgias, headache, weight loss or anorexia. ENT:  No earache, ear congestion, nasal congestion, sneezing, rhinorrhea, sinus pressure, sinus pain, post nasal drip, or sore throat. Lungs:  No cough, sputum production, or shortness of breath. No chest pain. Skin:  No rash or itching.  PMFSH:  Past medical history, family history, social history, meds, and allergies were reviewed.  No history of allergic rhinitis.  No use of tobacco. She has reflux and takes Nexium. Her last menstrual period was April 6. She's not pregnant or breast-feeding.  Physical Exam:   Vital signs:  BP 118/65  Pulse 93  Temp(Src) 98.3 F (36.8 C) (Oral)  Resp 18  SpO2 100%  LMP 12/14/2012 General:  Alert, in no distress. Eye:  No conjunctival injection or drainage. Lids were normal. ENT:  TMs and canals were normal, without erythema or inflammation.  Nasal mucosa was clear and uncongested, without drainage.  Mucous membranes were moist.  Pharynx was clear, without exudate or drainage.  There were no oral ulcerations or lesions. Neck:  Supple, no adenopathy, tenderness or mass. Lungs:  No retractions or use of accessory muscles.  No respiratory distress.  Lungs were clear to auscultation, without wheezes, rales or rhonchi.  Breath sounds were  clear and equal bilaterally. Heart:  Regular rhythm, without gallops, murmers or rubs. Skin:  Clear, warm, and dry, without rash or lesions.  Assessment:  The primary encounter diagnosis was Viral upper respiratory infection. Diagnoses of Asthma, Acute bronchitis, and Acute sinusitis were also pertinent to this visit.  Plan:   1.  The following meds were prescribed:   Discharge Medication List as of 12/24/2012 12:24 PM    START taking these medications   Details  amoxicillin (AMOXIL) 500 MG capsule Take 2 capsules (1,000 mg total) by mouth 3 (three) times daily., Starting 12/24/2012, Until Discontinued, Normal    chlorpheniramine-HYDROcodone (TUSSIONEX) 10-8 MG/5ML LQCR Take 5 mLs by mouth every 12 (twelve) hours as needed., Starting 12/24/2012, Until Discontinued, Normal    HYDROcodone-acetaminophen (NORCO/VICODIN) 5-325 MG per tablet 1 to 2 tabs every 4 to 6 hours as needed for pain., Print    predniSONE (DELTASONE) 20 MG tablet Take 1 tablet (20 mg total) by mouth 2 (two) times daily., Starting 12/24/2012, Until Discontinued, Normal       2.  The patient was instructed in symptomatic care and handouts were given. 3.  The patient was told to return if becoming worse in any way, if no better in 3 or 4 days, and given some red flag symptoms such as fever, difficulty breathing, or increasing pain that would indicate earlier return.  Follow up:  The patient was told to follow up here if no better in 3 or 4 days.     Reuben Likes, MD 12/24/12 2125

## 2012-12-24 NOTE — ED Notes (Signed)
Pt is here c/o cold sx onset 2 days Sx include: nasal/chest congestion, dizziness, chest discomfort due to cough, ST, chills, fevers Denies: v/n/d Taking OTC decongestants w/no relief  She is alert and oriented w/no signs of acute distress.

## 2013-02-13 ENCOUNTER — Telehealth: Payer: Self-pay | Admitting: Oncology

## 2013-02-13 NOTE — Telephone Encounter (Signed)
LVOM FOR PT TO RETURN CALL IN RE NP APPT.  °

## 2013-02-20 DIAGNOSIS — Z8719 Personal history of other diseases of the digestive system: Secondary | ICD-10-CM | POA: Insufficient documentation

## 2013-02-20 DIAGNOSIS — Z9884 Bariatric surgery status: Secondary | ICD-10-CM | POA: Insufficient documentation

## 2013-02-20 DIAGNOSIS — Z8619 Personal history of other infectious and parasitic diseases: Secondary | ICD-10-CM | POA: Insufficient documentation

## 2013-02-20 DIAGNOSIS — F4322 Adjustment disorder with anxiety: Secondary | ICD-10-CM | POA: Insufficient documentation

## 2013-02-20 DIAGNOSIS — F419 Anxiety disorder, unspecified: Secondary | ICD-10-CM | POA: Insufficient documentation

## 2013-05-12 ENCOUNTER — Other Ambulatory Visit: Payer: Self-pay | Admitting: Endocrinology

## 2013-09-10 NOTE — L&D Delivery Note (Signed)
  Vaginal Delivery Note  The pt utilized an epidural as pain management.   Artificial rupture of membranes on 05/12/14, at 2303, clear.  GBS was positive, PCN x 6 doses were given.  Cervical dilation was complete at  0944.  NICHD Category 2 FHR 150, moderate variability, no accel, variable and late decel.      Pushing with guidance began at 1037.  After 7 minutes of pushing the head, shoulders and the body of a viable female infant delivered spontaneously with maternal effort in the LOA position at 1044   [place] x 1 but unable to reduce, infant somersaulted thru without difficulty.  With vigorous tone and spontaneous cry, the infant was placed on moms abd.   The cord was clamped then cut by the patient herself.  Spontaneous delivery of a intact placenta with a 3 vessel cord via [place] at 1057.  Episiotomy: None The vulva, perineum, vaginal vault, rectum and cervix were inspected and revealed a superficial bilateral labial that was not repaired at the patients request.    Postpartum pitocin as ordered.   Fundus firm, lochia moderate, bleeding under control.  EBL 250, Pt hemodynamically stable.  Sponge, laps and needle count correct and verified with the primary care nurse.  Attending MD available at all times.    Mom and baby were left in stable condition, baby skin to skin.  Routine postpartum orders  Mother desires nothing for contraception   Placenta to pathology: NO Cord blood sent to lab: YES Cord Gases sent to lab: NO   APGARS:  9 at 1 minute and 9 at 5 minutes Weight:.  6lb 8.1oz (2951)   Naftula Donahue, CNM, MSN 05/13/2014. 12:55 PM

## 2013-11-09 ENCOUNTER — Encounter: Payer: Self-pay | Admitting: Obstetrics & Gynecology

## 2013-11-09 ENCOUNTER — Ambulatory Visit (INDEPENDENT_AMBULATORY_CARE_PROVIDER_SITE_OTHER): Payer: PRIVATE HEALTH INSURANCE | Admitting: Obstetrics & Gynecology

## 2013-11-09 VITALS — BP 102/70 | Temp 98.6°F | Wt 243.0 lb

## 2013-11-09 DIAGNOSIS — O99210 Obesity complicating pregnancy, unspecified trimester: Secondary | ICD-10-CM | POA: Insufficient documentation

## 2013-11-09 DIAGNOSIS — O24319 Unspecified pre-existing diabetes mellitus in pregnancy, unspecified trimester: Secondary | ICD-10-CM

## 2013-11-09 DIAGNOSIS — J45909 Unspecified asthma, uncomplicated: Secondary | ICD-10-CM

## 2013-11-09 DIAGNOSIS — Z3201 Encounter for pregnancy test, result positive: Secondary | ICD-10-CM

## 2013-11-09 DIAGNOSIS — O09519 Supervision of elderly primigravida, unspecified trimester: Secondary | ICD-10-CM

## 2013-11-09 DIAGNOSIS — Z3400 Encounter for supervision of normal first pregnancy, unspecified trimester: Secondary | ICD-10-CM

## 2013-11-09 LAB — IRON: IRON: 41 ug/dL — AB (ref 42–145)

## 2013-11-09 LAB — OB RESULTS CONSOLE GC/CHLAMYDIA
CHLAMYDIA, DNA PROBE: NEGATIVE
Gonorrhea: NEGATIVE

## 2013-11-09 LAB — OB RESULTS CONSOLE GBS: GBS: POSITIVE

## 2013-11-09 NOTE — Progress Notes (Signed)
Pulse: 87 Patient states she is having lower abdominal pain. Patient states she was having some spotting and was put on bed rest, given a shot of prolactin and vaginal suppositorys for 8 days. Patient states she has not had any bleeding for 2 weeks. Patient states she has a somewhat thick yellow discharge. Patient denies any itching, irritation, burning or vaginal odor. [place] Patient states she is having some congestion.  Subjective:    Karina [name] is being seen today for her first obstetrical visit.  This is not a planned pregnancy. She is at 7650w5d gestation. Her obstetrical history is significant for Patient states she had some blockage in her tubes and was told it would be a slim chance she would be able to get pregnant unitl she had surgery .[name]. Relationship with FOB: spouse, not living together. Patient does intend to breast feed. Pregnancy history fully reviewed.  Menstrual History: OB History   Grav Para Term Preterm Abortions TAB SAB Ect Mult Living   1               Menarche age: [age] Patient's last menstrual period was 08/19/2013.    The following portions of the patient's history were reviewed and updated as appropriate: allergies, current medications, past family history, past medical history, past social history, past surgical history and problem list.  Review of Systems Pertinent items are noted in HPI.    Objective:      General Appearance:    Alert, cooperative, no distress, appears stated age  Head:    Normocephalic, without obvious abnormality, atraumatic  Eyes:    PERRL, conjunctiva/corneas clear, EOM's intact, fundi    benign, both eyes  Ears:    Normal TM's and external ear canals, both ears  Nose:   Nares normal, septum midline, mucosa normal, no drainage    or sinus tenderness  Throat:   Lips, mucosa, and tongue normal; teeth and gums normal  Neck:   Supple, symmetrical, trachea midline, no adenopathy;   thyroid:  no enlargement/tenderness/nodules; no carotid   bruit or JVD  Back:     Symmetric, no curvature, ROM normal, no CVA tenderness  Lungs:     Clear to auscultation bilaterally, respirations unlabored  Chest Wall:    No tenderness or deformity   Heart:    Regular rate and rhythm, S1 and S2 normal, no murmur, rub   or gallop  Breast Exam:    No tenderness, masses, or nipple abnormality  Abdomen:     Soft, non-tender, bowel sounds active all four quadrants,    no masses, no organomegaly  Genitalia:    Normal female without lesion, discharge or tenderness  Extremities:   Extremities normal, atraumatic, no cyanosis or edema  Pulses:   2+ and symmetric all extremities  Skin:   Skin color, texture, turgor normal, no rashes or lesions  Lymph nodes:   Cervical, supraclavicular, and axillary nodes normal  Neurologic:   CNII-XII intact, normal strength, sensation and reflexes    throughout        Assessment:    Pregnancy at 2550w5d weeks    Plan:    Initial labs drawn. Prenatal vitamins.  Counseling provided regarding continued use of seat belts, cessation of alcohol consumption, smoking or use of illicit drugs; infection precautions i.e., influenza/TDAP immunizations, toxoplasmosis,CMV, parvovirus, listeria and varicella; workplace safety, exercise during pregnancy; routine dental care, safe medications, sexual activity, hot tubs, saunas, pools, travel, caffeine  use, fish and methlymercury, potential toxins, hair treatments, varicose veins Weight gain recommendations per IOM guidelines reviewed:  obese/BMI >30->gain  11 - 20 lbs Problem list reviewed and updated. FIRSTdiscussed. Role of ultrasound in pregnancy discussed. Amniocentesis discussed. Check micronutrients/baseline labs Follow up in 4 weeks. 50% of 20 min visit spent on counseling and coordination of care.

## 2013-11-09 NOTE — Patient Instructions (Signed)
Chewable Multivitamin with Minerals and Iron formulations What is this medicine? CHEWABLE MULTIVITAMIN with MINERAL and IRON combinations are used to help provide good nutrition. This medicine may be used for other purposes; ask your health care provider or pharmacist if you have questions. COMMON BRAND NAME(S): Animal Shapes + Iron, Centrum Kid's, Cerovite Jr., Chewable Vite With Iron , Duet Chewable, Flintstones Complete, Fruity Chews with Iron, Multi Vita-Bets with 1mg  Fluoride and Iron , Multi Vita-Bets with Fluoride 0.25mg  , Multi Vita-Bets with Fluoride 0.5mg  and Iron , MultiChew, Multivitamin with Iron, OB Choice, One-A-Day Kid's, Poly-Vi-Flor with Iron, Prenatal 19, Se-Natal 19 Chewable, Strongstart Chewable, Vi-Daylin/F with Iron What should I tell my health care provider before I take this medicine? They need to know if you have any of these conditions: -bleeding or clotting disorder -history of anemia of any type -other chronic health condition -phenylketonuria -an unusual or allergic reaction to vitamins, minerals, other medicines, foods, dyes, or preservatives -pregnant or trying to get pregnant -breast-feeding How should I use this medicine? Take by mouth and chew throroughly before swallowing. May take with a glass of water, juice, or milk. May take with food. Follow the directions on the prescription or product label. The usual dose is one tablet once a day. Do not take your medicine more often than directed. This medicine is regularly used in children 81 years of age and older; some products may be used in children as young as 22 years of age. If your child is younger than 96 years of age, it is recommended to check with your pediatrician prior to use. Special care may be needed. Overdosage: If you think you have taken too much of this medicine contact a poison control center or emergency room at once. NOTE: This medicine is only for you. Do not share this medicine with others. What  if I miss a dose? If you miss a dose, take it as soon as you can. If it is almost time for your next dose, take only that dose. Do not take double or extra doses. What may interact with this medicine? -antacids -cefdinir -cefditoren -etidronate -fluoroquinolone antibiotics (examples: ciprofloxacin, gatifloxacin, levofloxacin) -levodopa -tetracycline antibiotics (examples: doxycycline, minocycline, tetracycline) -thyroid hormones -warfarin This list may not describe all possible interactions. Give your health care provider a list of all the medicines, herbs, non-prescription drugs, or dietary supplements you use. Also tell them if you smoke, drink alcohol, or use illegal drugs. Some items may interact with your medicine. What should I watch for while using this medicine? Get regular checks on your progress. Remember that vitamin and mineral supplements do not replace the need for good nutrition from a balanced diet. Talk with your health care professional if you have questions or need advice. What side effects may I notice from receiving this medicine? Side effects that you should report to your doctor or health care professional as soon as possible: -allergic reaction such as skin rash or difficulty breathing -vomiting Side effects that usually do not require medical attention (report to your doctor or health care professional if they continue or are bothersome): -nausea -stomach upset This list may not describe all possible side effects. Call your doctor for medical advice about side effects. You may report side effects to FDA at 1-800-FDA-1088. Where should I keep my medicine? Keep out of the reach of children. Most vitamins and minerals should be stored at controlled room temperature between 15 and 30 degrees C (59 and 86 degrees F). Check your specific  product directions. Protect from heat and moisture. Throw away any unused medicine after the expiration date. NOTE: This sheet is a  summary. It may not cover all possible information. If you have questions about this medicine, talk to your doctor, pharmacist, or health care provider.  2014, Elsevier/Gold Standard. (2005-10-22 14:29:00) Prenatal Care  WHAT IS PRENATAL CARE?  Prenatal care means health care during your pregnancy, before your baby is born. It is very important to take care of yourself and your baby during your pregnancy by:   Getting early prenatal care. If you know you are pregnant, or think you might be pregnant, call your health care provider as soon as possible. Schedule a visit for a prenatal exam.  Getting regular prenatal care. Follow your health care provider's schedule for blood and other necessary tests. Do not miss appointments.  Doing everything you can to keep yourself and your baby healthy during your pregnancy.  Getting complete care. Prenatal care should include evaluation of the medical, dietary, educational, psychological, and social needs of you and your significant other. The medical and genetic history of your family and the family of your baby's father should be discussed with your health care provider.  Discussing with your health care provider:  Prescription, over-the-counter, and herbal medicines that you take.  Any history of substance abuse, alcohol use, smoking, and illegal drug use.  Any history of domestic abuse and violence.  Immunizations you have received.  Your nutrition and diet.  The amount of exercise you do.  Any environmental and occupational hazards to which you are exposed.  History of sexually transmitted infections for both you and your partner.  Previous pregnancies you have had. WHY IS PRENATAL CARE SO IMPORTANT?  By regularly seeing your health care provider, you help ensure that problems can be identified early so that they can be treated as soon as possible. Other problems might be prevented. Many studies have shown that early and regular prenatal  care is important for the health of mothers and their babies.  HOW CAN I TAKE CARE OF MYSELF WHILE I AM PREGNANT?  Here are ways to take care of yourself and your baby:   Start or continue taking your multivitamin with 400 micrograms (mcg) of folic acid every day.  Get early and regular prenatal care. It is very important to see a health care provider during your pregnancy. Your health care provider will check at each visit to make sure that you and the baby are healthy. If there are any problems, action can be taken right away to help you and the baby.  Eat a healthy diet that includes:  Fruits.  Vegetables.  Foods low in saturated fat.  Whole grains.  Calcium-rich foods, such as milk, yogurt, and hard cheeses.  Drink 6 to 8 glasses of liquids a day.  Unless your health care provider tells you not to, try to be physically active for 30 minutes, most days of the week. If you are pressed for time, you can get your activity in through 10-minute segments, three times a day.  Do not smoke, drink alcohol, or use drugs. These can cause long-term damage to your baby. Talk with your health care provider about steps to take to stop smoking. Talk with a member of your faith community, a counselor, a trusted friend, or your health care provider if you are concerned about your alcohol or drug use.  Ask your health care provider before taking any medicine, even over-the-counter medicines. Some medicines  are not safe to take during pregnancy.  Get plenty of rest and sleep.  Avoid hot tubs and saunas during pregnancy.  Do not have X-rays taken unless absolutely necessary and with the recommendation of your health care provider. A lead shield can be placed on your abdomen to protect the baby when X-rays are taken in other parts of the body.  Do not empty the cat litter when you are pregnant. It may contain a parasite that causes an infection called toxoplasmosis, which can cause birth defects.  Also, use gloves when working in garden areas used by cats.  Do not eat uncooked or undercooked meats or fish.  Do not eat soft, mold-ripened cheeses (Brie, Camembert, and chevre) or soft, blue-veined cheese (Danish blue and Roquefort).  Stay away from toxic chemicals like:  Insecticides.  Solvents (some cleaners or paint thinners).  Lead.  Mercury.  Sexual intercourse may continue until the end of the pregnancy, unless you have a medical problem or there is a problem with the pregnancy and your health care provider tells you not to.  Do not wear high-heel shoes, especially during the second half of the pregnancy. You can lose your balance and fall.  Do not take long trips, unless absolutely necessary. Be sure to see your health care provider before going on the trip.  Do not sit in one position for more than 2 hours when on a trip.  Take a copy of your medical records when going on a trip. Know where a hospital is located in the city you are visiting, in case of an emergency.  Most dangerous household products will have pregnancy warnings on their labels. Ask your health care provider about products if you are unsure.  Limit or eliminate your caffeine intake from coffee, tea, sodas, medicines, and chocolate.  Many women continue working through pregnancy. Staying active might help you stay healthier. If you have a question about the safety or the hours you work at your particular job, talk with your health care provider.  Get informed:  Read books.  Watch videos.  Go to childbirth classes for you and your significant other.  Talk with experienced moms.  Ask your health care provider about childbirth education classes for you and your partner. Classes can help you and your partner prepare for the birth of your baby.  Ask about a baby doctor (pediatrician) and methods and pain medicine for labor, delivery, and possible cesarean delivery. HOW OFTEN SHOULD I SEE MY HEALTH  CARE PROVIDER DURING PREGNANCY?  Your health care provider will give you a schedule for your prenatal visits. You will have visits more often as you get closer to the end of your pregnancy. An average pregnancy lasts about 40 weeks.  A typical schedule includes visiting your health care provider:   About once each month during your first 6 months of pregnancy.  Every 2 weeks during the next 2 months.  Weekly in the last month, until the delivery date. Your health care provider will probably want to see you more often if:  You are older than 35 years.  Your pregnancy is high risk because you have certain health problems or problems with the pregnancy, such as:  Diabetes.  High blood pressure.  The baby is not growing on schedule, according to the dates of the pregnancy. Your health care provider will do special tests to make sure you and the baby are not having any serious problems. WHAT HAPPENS DURING PRENATAL VISITS?  At your first prenatal visit, your health care provider will do a physical exam and talk to you about your health history and the health history of your partner and your family. Your health care provider will be able to tell you what date to expect your baby to be born on.  Your first physical exam will include checks of your blood pressure, measurements of your height and weight, and an exam of your pelvic organs. Your health care provider will do a Pap test if you have not had one recently and will do cultures of your cervix to make sure there is no infection.  At each prenatal visit, there will be tests of your blood, urine, blood pressure, weight, and checking the progress of the baby.  At your later prenatal visits, your health care provider will check how you are doing and how the baby is developing. You may have a number of tests done as your pregnancy progresses.  Ultrasound exams are often used to check on the baby's growth and health.  You may have more  urine and blood tests, as well as special tests, if needed. These may include amniocentesis to examine fluid in the pregnancy sac, stress tests to check how the baby responds to contractions, or a biophysical profile to measure fetus well-being. Your health care provider will explain the tests and why they are necessary.  You should discuss with your health care provider your plans to breastfeed or bottle-feed your baby.  Each visit is also a chance for you to learn about staying healthy during pregnancy and to ask questions. Document Released: 08/30/2003 Document Revised: 06/17/2013 Document Reviewed: 02/12/2013 [facility] Patient Information 2014 [place]Care, [place].

## 2013-11-10 LAB — FERRITIN: Ferritin: 36 ng/mL (ref 10–291)

## 2013-11-10 LAB — OBSTETRIC PANEL
Antibody Screen: NEGATIVE
BASOS ABS: 0 10*3/uL (ref 0.0–0.1)
Basophils Relative: 0 % (ref 0–1)
EOS ABS: 0.3 10*3/uL (ref 0.0–0.7)
EOS PCT: 3 % (ref 0–5)
HCT: 38.1 % (ref 36.0–46.0)
HEP B S AG: NEGATIVE
Hemoglobin: 13.3 g/dL (ref 12.0–15.0)
LYMPHS ABS: 2.5 10*3/uL (ref 0.7–4.0)
LYMPHS PCT: 27 % (ref 12–46)
MCH: 26.8 pg (ref 26.0–34.0)
MCHC: 34.9 g/dL (ref 30.0–36.0)
MCV: 76.8 fL — AB (ref 78.0–100.0)
Monocytes Absolute: 0.7 10*3/uL (ref 0.1–1.0)
Monocytes Relative: 7 % (ref 3–12)
NEUTROS PCT: 63 % (ref 43–77)
Neutro Abs: 5.9 10*3/uL (ref 1.7–7.7)
PLATELETS: 240 10*3/uL (ref 150–400)
RBC: 4.96 MIL/uL (ref 3.87–5.11)
RDW: 17.2 % — AB (ref 11.5–15.5)
Rh Type: POSITIVE
Rubella: 1.57 Index — ABNORMAL HIGH (ref <0.90)
WBC: 9.4 10*3/uL (ref 4.0–10.5)

## 2013-11-10 LAB — HEMOGLOBINOPATHY EVALUATION
HGB A2 QUANT: 3 % (ref 2.2–3.2)
HGB F QUANT: 0 % (ref 0.0–2.0)
HGB S QUANTITAION: 0 %
Hemoglobin Other: 33.3 % — ABNORMAL HIGH
Hgb A: 63.7 % — ABNORMAL LOW (ref 96.8–97.8)

## 2013-11-10 LAB — WET PREP BY MOLECULAR PROBE
CANDIDA SPECIES: POSITIVE — AB
GARDNERELLA VAGINALIS: NEGATIVE
Trichomonas vaginosis: NEGATIVE

## 2013-11-10 LAB — VITAMIN D 25 HYDROXY (VIT D DEFICIENCY, FRACTURES): VIT D 25 HYDROXY: 33 ng/mL (ref 30–89)

## 2013-11-10 LAB — FOLATE: FOLATE: 14.2 ng/mL

## 2013-11-10 LAB — VARICELLA ZOSTER ANTIBODY, IGG: VARICELLA IGG: 2167 {index} — AB (ref <135.00)

## 2013-11-10 LAB — CULTURE, OB URINE
Colony Count: NO GROWTH
ORGANISM ID, BACTERIA: NO GROWTH

## 2013-11-10 LAB — GC/CHLAMYDIA PROBE AMP
CT PROBE, AMP APTIMA: NEGATIVE
GC Probe RNA: NEGATIVE

## 2013-11-10 LAB — VITAMIN B12: Vitamin B-12: 450 pg/mL (ref 211–911)

## 2013-11-10 LAB — HIV ANTIBODY (ROUTINE TESTING W REFLEX): HIV: NONREACTIVE

## 2013-11-12 ENCOUNTER — Other Ambulatory Visit: Payer: Self-pay | Admitting: *Deleted

## 2013-11-12 DIAGNOSIS — B379 Candidiasis, unspecified: Secondary | ICD-10-CM

## 2013-11-12 LAB — VITAMIN B1: Vitamin B1 (Thiamine): <7 nmol/L — ABNORMAL LOW (ref 8–30)

## 2013-11-12 MED ORDER — TERCONAZOLE 0.4 % VA CREA: 1.0000 | TOPICAL_CREAM | Freq: Every day | VAGINAL | Status: DC

## 2013-11-13 ENCOUNTER — Encounter: Payer: Self-pay | Admitting: Obstetrics & Gynecology

## 2013-11-13 DIAGNOSIS — D582 Other hemoglobinopathies: Secondary | ICD-10-CM | POA: Insufficient documentation

## 2013-11-13 DIAGNOSIS — E519 Thiamine deficiency, unspecified: Secondary | ICD-10-CM | POA: Insufficient documentation

## 2013-11-13 LAB — HGB ELECTROPHORESIS REFLEXED REPORT
Hemoglobin A - HGBRFX: 58.5 % — ABNORMAL LOW (ref >96.0)
Hemoglobin A2 - HGBRFX: 2.7 % (ref 1.8–3.5)
Hemoglobin Elect C: 38.8 % — ABNORMAL HIGH
Hemoglobin F - HGBRFX: 0 % (ref <2.0)
SICKLE SOLUBILITY TEST - HGBRFX: NEGATIVE

## 2013-11-17 ENCOUNTER — Encounter: Payer: Self-pay | Admitting: Obstetrics & Gynecology

## 2013-12-04 ENCOUNTER — Encounter: Payer: Self-pay | Admitting: Obstetrics & Gynecology

## 2013-12-07 ENCOUNTER — Encounter: Payer: No Typology Code available for payment source | Admitting: Obstetrics & Gynecology

## 2014-03-14 ENCOUNTER — Emergency Department (HOSPITAL_COMMUNITY)
Admission: EM | Admit: 2014-03-14 | Discharge: 2014-03-14 | Disposition: A | Discharge status: Home / Self Care | Payer: Medicaid Other | Attending: Emergency Medicine | Admitting: Emergency Medicine

## 2014-03-14 ENCOUNTER — Encounter (HOSPITAL_COMMUNITY): Payer: Self-pay | Admitting: Emergency Medicine

## 2014-03-14 DIAGNOSIS — O219 Vomiting of pregnancy, unspecified: Secondary | ICD-10-CM | POA: Diagnosis present

## 2014-03-14 DIAGNOSIS — K219 Gastro-esophageal reflux disease without esophagitis: Secondary | ICD-10-CM | POA: Diagnosis not present

## 2014-03-14 DIAGNOSIS — O24919 Unspecified diabetes mellitus in pregnancy, unspecified trimester: Secondary | ICD-10-CM | POA: Diagnosis not present

## 2014-03-14 DIAGNOSIS — E119 Type 2 diabetes mellitus without complications: Secondary | ICD-10-CM | POA: Insufficient documentation

## 2014-03-14 DIAGNOSIS — R197 Diarrhea, unspecified: Secondary | ICD-10-CM | POA: Diagnosis not present

## 2014-03-14 DIAGNOSIS — Z9089 Acquired absence of other organs: Secondary | ICD-10-CM | POA: Insufficient documentation

## 2014-03-14 DIAGNOSIS — Z3490 Encounter for supervision of normal pregnancy, unspecified, unspecified trimester: Secondary | ICD-10-CM

## 2014-03-14 DIAGNOSIS — O9989 Other specified diseases and conditions complicating pregnancy, childbirth and the puerperium: Secondary | ICD-10-CM | POA: Insufficient documentation

## 2014-03-14 DIAGNOSIS — Z9884 Bariatric surgery status: Secondary | ICD-10-CM | POA: Insufficient documentation

## 2014-03-14 DIAGNOSIS — G4730 Sleep apnea, unspecified: Secondary | ICD-10-CM | POA: Insufficient documentation

## 2014-03-14 DIAGNOSIS — Z79899 Other long term (current) drug therapy: Secondary | ICD-10-CM | POA: Insufficient documentation

## 2014-03-14 DIAGNOSIS — Z8619 Personal history of other infectious and parasitic diseases: Secondary | ICD-10-CM | POA: Diagnosis not present

## 2014-03-14 DIAGNOSIS — R112 Nausea with vomiting, unspecified: Secondary | ICD-10-CM

## 2014-03-14 DIAGNOSIS — Z9981 Dependence on supplemental oxygen: Secondary | ICD-10-CM | POA: Diagnosis not present

## 2014-03-14 DIAGNOSIS — O99019 Anemia complicating pregnancy, unspecified trimester: Secondary | ICD-10-CM | POA: Insufficient documentation

## 2014-03-14 DIAGNOSIS — Z87891 Personal history of nicotine dependence: Secondary | ICD-10-CM | POA: Insufficient documentation

## 2014-03-14 DIAGNOSIS — J45909 Unspecified asthma, uncomplicated: Secondary | ICD-10-CM | POA: Diagnosis not present

## 2014-03-14 DIAGNOSIS — D649 Anemia, unspecified: Secondary | ICD-10-CM | POA: Diagnosis not present

## 2014-03-14 LAB — BASIC METABOLIC PANEL
Anion gap: 13 (ref 5–15)
BUN: 7 mg/dL (ref 6–23)
CO2: 23 mEq/L (ref 19–32)
Calcium: 9 mg/dL (ref 8.4–10.5)
Chloride: 100 mEq/L (ref 96–112)
Creatinine, Ser: 0.6 mg/dL (ref 0.50–1.10)
GFR calc Af Amer: >90 mL/min (ref >90)
GFR calc non Af Amer: >90 mL/min (ref >90)
Glucose, Bld: 78 mg/dL (ref 70–99)
Potassium: 4.2 mEq/L (ref 3.7–5.3)
Sodium: 136 mEq/L — ABNORMAL LOW (ref 137–147)

## 2014-03-14 LAB — URINALYSIS, ROUTINE W REFLEX MICROSCOPIC
Bilirubin Urine: NEGATIVE
Glucose, UA: NEGATIVE mg/dL
Hgb urine dipstick: NEGATIVE
Ketones, ur: NEGATIVE mg/dL
Nitrite: NEGATIVE
Protein, ur: NEGATIVE mg/dL
Specific Gravity, Urine: 1.02 (ref 1.005–1.030)
Urobilinogen, UA: 1 mg/dL (ref 0.0–1.0)
pH: 7.5 (ref 5.0–8.0)

## 2014-03-14 LAB — URINE MICROSCOPIC-ADD ON

## 2014-03-14 MED ORDER — ONDANSETRON HCL 4 MG/2ML IJ SOLN
4.0000 mg | Freq: Once | INTRAMUSCULAR | Status: AC
  Administered 2014-03-14: 4 mg via INTRAVENOUS
  Filled 2014-03-14: qty 2

## 2014-03-14 MED ORDER — SODIUM CHLORIDE 0.9 % IV BOLUS (SEPSIS)
1000.0000 mL | Freq: Once | INTRAVENOUS | Status: AC
  Administered 2014-03-14: 1000 mL via INTRAVENOUS

## 2014-03-14 MED ORDER — ONDANSETRON 4 MG PO TBDP: 4.0000 mg | ORAL_TABLET | Freq: Three times a day (TID) | ORAL | Status: DC | PRN

## 2014-03-14 NOTE — Discharge Instructions (Signed)

## 2014-03-14 NOTE — ED Notes (Signed)
md at bedside

## 2014-03-14 NOTE — ED Provider Notes (Signed)
CSN: [id number]     Arrival date & time 03/14/14  0847 History   First MD Initiated Contact with Patient 03/14/14 0848     Chief Complaint  Patient presents with  . Diarrhea  . Emesis  . [redacted] weeks pregnant      (Consider location/radiation/quality/duration/timing/severity/associated sxs/prior Treatment) HPI  33yF and diarrhea for the past 8 days. Some intermittent nausea and has vomited twice over the same time period. Patient is approximately [redacted] weeks pregnant. She denies any abdominal or back pain. No urinary complaints. No vaginal bleeding. No leakage of fluid. No fever. No sick contacts is feeling her baby move. She is concerned that she may becoming dehydrated which is why she presented to the emergency room.  Past Medical History  Diagnosis Date  . Chronic bronchitis   . History of chicken pox   . Asthma     albuterol as needed  . Sleep apnea     cpap  . Diabetes mellitus     takes metformin daily  . Anemia     takes iron daily  . GERD (gastroesophageal reflux disease)     nexium   . Edema extremities     hx of edema in lower ext. takes lasix PRN last dose few weeks ago  . Gallbladder disease    Past Surgical History  Procedure Laterality Date  . Carpal tunnel release  2008, 2010  . Cholecystectomy  01/11/2012    Procedure: LAPAROSCOPIC CHOLECYSTECTOMY WITH INTRAOPERATIVE CHOLANGIOGRAM;  Surgeon: Cherylynn RidgesJames O Wyatt, MD;  Location: [facility] OR;  Service: General;  Laterality: N/A;  . Laparoscopic gastric banding     Family History  Problem Relation Age of Onset  . Diabetes Sister   . Heart disease Other   . Hyperlipidemia Other   . Hypertension Other   . Stroke Other   . Kidney disease Other   . Thyroid disease Other   . Ovarian cancer Maternal Aunt   . Prostate cancer Maternal Uncle   . Cervical cancer Maternal Aunt   . Liver cancer Paternal Grandfather   . Liver cancer Paternal Uncle   . Anesthesia problems Mother   . Hypotension Neg Hx   . Malignant hyperthermia Neg  Hx   . Pseudochol deficiency Neg Hx    History  Substance Use Topics  . Smoking status: Former Games developermoker  . Smokeless tobacco: Never Used  . Alcohol Use: No     Comment: socially   OB History   Grav Para Term Preterm Abortions TAB SAB Ect Mult Living   1              Review of Systems  All systems reviewed and negative, other than as noted in HPI.   Allergies  Review of patient's allergies indicates no known allergies.  Home Medications   Prior to Admission medications   Medication Sig Start Date End Date Taking? Authorizing Provider  acetaminophen (TYLENOL) 500 MG tablet Take 500 mg by mouth every 6 (six) hours as needed for mild pain.   Yes Historical Provider, MD  albuterol (PROVENTIL HFA;VENTOLIN HFA) 108 (90 BASE) MCG/ACT inhaler Inhale into the lungs every 6 (six) hours as needed for wheezing or shortness of breath.   Yes Historical Provider, MD  calcium carbonate (TUMS - DOSED IN MG ELEMENTAL CALCIUM) 500 MG chewable tablet Chew 1 tablet by mouth daily as needed for indigestion.    Yes Historical Provider, MD  esomeprazole (NEXIUM) 40 MG capsule Take 40 mg by mouth daily as needed (  for indigestion).    Yes Historical Provider, MD  ferrous sulfate 325 (65 FE) MG tablet Take 325 mg by mouth daily with breakfast.   Yes Historical Provider, MD  Prenatal Vit-Fe Fumarate-FA (MULTIVITAMIN-PRENATAL) 27-0.8 MG TABS tablet Take 1 tablet by mouth daily at 12 noon.   Yes Historical Provider, MD  thiamine (VITAMIN B-1) 100 MG tablet Take 100 mg by mouth daily.   Yes Historical Provider, MD   BP 107/58  Pulse 68  Temp(Src) 97.9 F (36.6 C) (Oral)  Resp 16  SpO2 96%  LMP 08/19/2013 Physical Exam  Nursing note reviewed. Constitutional: She appears well-developed and well-nourished. No distress.  Laying in bed. No acute distress  HENT:  Head: Normocephalic and atraumatic.  Eyes: Conjunctivae are normal. Right eye exhibits no discharge. Left eye exhibits no discharge.  Neck: Neck  supple.  Cardiovascular: Normal rate, regular rhythm and normal heart sounds.  Exam reveals no gallop and no friction rub.   No murmur heard. Pulmonary/Chest: Effort normal and breath sounds normal. No respiratory distress.  Abdominal: Soft. She exhibits no distension. There is no tenderness.  Bedside US with large IUP. +fetal movement. HR 150s.   Musculoskeletal: She exhibits no edema and no tenderness.  Neurological: She is alert.  Skin: Skin is warm and dry.  Psychiatric: She has a normal mood and affect. Her behavior is normal. Thought content normal.    ED Course  Procedures (including critical care time) Labs Review Labs Reviewed  BASIC METABOLIC PANEL - Abnormal; Notable for the following:    Sodium 136 (*)    All other components within normal limits  URINALYSIS, ROUTINE W REFLEX MICROSCOPIC - Abnormal; Notable for the following:    APPearance CLOUDY (*)    Leukocytes, UA MODERATE (*)    All other components within normal limits  URINE MICROSCOPIC-ADD ON - Abnormal; Notable for the following:    Squamous Epithelial / LPF FEW (*)    All other components within normal limits    Imaging Review No results found.   EKG Interpretation None      MDM   Final diagnoses:  Nausea vomiting and diarrhea  Pregnancy    33 year old female with nausea/vomiting and diarrhea in the third trimester. She appears well. Hemodynamically stable. Afebrile. No abdominal pain, vaginal bleeding or leakage of fluid. Labs without evidence of significant metabolic derangement. Urinalysis without ketones. She is given IV fluids and anti-emetics. She currently feels better. I feel she is stable for discharge. Will prescribe as needed Zofran.    Raeford RazorStephen Lakasha Mcfall, MD 03/23/14 1620

## 2014-03-14 NOTE — ED Notes (Signed)
Pt alert and oriented x4. Respirations even and unlabored, bilateral symmetrical rise and fall of chest. Skin warm and dry. In no acute distress. Denies needs.   

## 2014-03-14 NOTE — ED Notes (Addendum)
Pt is [redacted] weeks pregnant and c/o diarrhea x 8 days. Denies any ain at the moment. Pt states c/o nausea now but states she has vomited twice during these 8 days as well. Pt had lap band surgery two years ago as well

## 2014-03-14 NOTE — ED Notes (Addendum)
see triage note. pt [redacted] weeks pregnant. 1st pregnant. early on pt thougth she was miscarrying, but no further complications. pt reports diarrhea x8 days, last episdoe was 2 days ago. vomited on thursday. pt reports feeling weaker, having "cotton mouth" and sometimes feeling lightheaded. was told she needed to come to ED to get fluids. pt denies pain. hx of lap band surgery 2 years ago.  Pt reports last bowel movement yesterday, was normal. Denies dysuria, but reports decreased urination. Reports she has had issues with dehydration since lap band surgery.

## 2014-03-14 NOTE — ED Notes (Signed)
md assessing status of baby via ultrasound

## 2014-03-28 ENCOUNTER — Encounter (HOSPITAL_COMMUNITY): Payer: Self-pay | Admitting: Emergency Medicine

## 2014-03-28 ENCOUNTER — Emergency Department (HOSPITAL_COMMUNITY)
Admission: EM | Admit: 2014-03-28 | Discharge: 2014-03-28 | Disposition: A | Discharge status: Home / Self Care | Payer: Medicaid Other | Source: Home / Self Care | Attending: Emergency Medicine | Admitting: Emergency Medicine

## 2014-03-28 DIAGNOSIS — R0789 Other chest pain: Secondary | ICD-10-CM

## 2014-03-28 DIAGNOSIS — K2990 Gastroduodenitis, unspecified, without bleeding: Secondary | ICD-10-CM

## 2014-03-28 DIAGNOSIS — K2970 Gastritis, unspecified, without bleeding: Secondary | ICD-10-CM

## 2014-03-28 HISTORY — DX: Encounter for supervision of normal pregnancy, unspecified, unspecified trimester: Z34.90

## 2014-03-28 LAB — CBC WITH DIFFERENTIAL/PLATELET
Basophils Absolute: 0 10*3/uL (ref 0.0–0.1)
Basophils Relative: 0 % (ref 0–1)
EOS ABS: 0.1 10*3/uL (ref 0.0–0.7)
EOS PCT: 1 % (ref 0–5)
HCT: 35.3 % — ABNORMAL LOW (ref 36.0–46.0)
Hemoglobin: 12.2 g/dL (ref 12.0–15.0)
LYMPHS ABS: 2.3 10*3/uL (ref 0.7–4.0)
Lymphocytes Relative: 20 % (ref 12–46)
MCH: 26.5 pg (ref 26.0–34.0)
MCHC: 34.6 g/dL (ref 30.0–36.0)
MCV: 76.6 fL — AB (ref 78.0–100.0)
MONOS PCT: 6 % (ref 3–12)
Monocytes Absolute: 0.6 10*3/uL (ref 0.1–1.0)
Neutro Abs: 8.4 10*3/uL — ABNORMAL HIGH (ref 1.7–7.7)
Neutrophils Relative %: 73 % (ref 43–77)
PLATELETS: 207 10*3/uL (ref 150–400)
RBC: 4.61 MIL/uL (ref 3.87–5.11)
RDW: 13.4 % (ref 11.5–15.5)
WBC: 11.5 10*3/uL — ABNORMAL HIGH (ref 4.0–10.5)

## 2014-03-28 LAB — POCT I-STAT, CHEM 8
BUN: 6 mg/dL (ref 6–23)
CALCIUM ION: 1.22 mmol/L (ref 1.12–1.23)
CREATININE: 0.6 mg/dL (ref 0.50–1.10)
Chloride: 103 mEq/L (ref 96–112)
Glucose, Bld: 75 mg/dL (ref 70–99)
HCT: 42 % (ref 36.0–46.0)
Hemoglobin: 14.3 g/dL (ref 12.0–15.0)
Potassium: 4.1 mEq/L (ref 3.7–5.3)
Sodium: 137 mEq/L (ref 137–147)
TCO2: 24 mmol/L (ref 0–100)

## 2014-03-28 LAB — POCT URINALYSIS DIP (DEVICE)
BILIRUBIN URINE: NEGATIVE
Glucose, UA: NEGATIVE mg/dL
HGB URINE DIPSTICK: NEGATIVE
KETONES UR: NEGATIVE mg/dL
Nitrite: NEGATIVE
Protein, ur: NEGATIVE mg/dL
SPECIFIC GRAVITY, URINE: 1.015 (ref 1.005–1.030)
Urobilinogen, UA: 0.2 mg/dL (ref 0.0–1.0)
pH: 7 (ref 5.0–8.0)

## 2014-03-28 MED ORDER — RANITIDINE HCL 150 MG PO CAPS: 150.0000 mg | ORAL_CAPSULE | Freq: Every day | ORAL | Status: DC

## 2014-03-28 NOTE — ED Notes (Signed)
Pt. Stated, FOr 3 days I've had upper abdominal pain for 3 days that's worse when I eat, even my bra bothers me.Its been constant and sometimes its stronger.

## 2014-03-28 NOTE — ED Notes (Signed)
Pt. Pregnant, did not call her OB doctor.

## 2014-03-28 NOTE — Discharge Instructions (Signed)
Chest Wall Pain Chest wall pain is pain in or around the bones and muscles of your chest. It may take up to 6 weeks to get better. It may take longer if you must stay physically active in your work and activities.  CAUSES  Chest wall pain may happen on its own. However, it may be caused by:  A viral illness like the flu.  Injury.  Coughing.  Exercise.  Arthritis.  Fibromyalgia.  Shingles. HOME CARE INSTRUCTIONS   Avoid overtiring physical activity. Try not to strain or perform activities that cause pain. This includes any activities using your chest or your abdominal and side muscles, especially if heavy weights are used.  Put ice on the sore area.  Put ice in a plastic bag.  Place a towel between your skin and the bag.  Leave the ice on for 15-20 minutes per hour while awake for the first 2 days.  Only take over-the-counter or prescription medicines for pain, discomfort, or fever as directed by your caregiver. SEEK IMMEDIATE MEDICAL CARE IF:   Your pain increases, or you are very uncomfortable.  You have a fever.  Your chest pain becomes worse.  You have new, unexplained symptoms.  You have nausea or vomiting.  You feel sweaty or lightheaded.  You have a cough with phlegm (sputum), or you cough up blood. MAKE SURE YOU:   Understand these instructions.  Will watch your condition.  Will get help right away if you are not doing well or get worse. Document Released: 08/27/2005 Document Revised: 11/19/2011 Document Reviewed: 04/23/2011 [facility]Exit[facility] Patient Information 2015 [place]Care, [place]. This information is not intended to replace advice given to you by your health care provider. Make sure you discuss any questions you have with your health care provider.   Food Choices for Peptic Ulcer Disease When you have peptic ulcer disease, the foods you eat and your eating habits are very important. Choosing the right foods can help ease the discomfort of peptic ulcer  disease. WHAT GENERAL GUIDELINES DO I NEED TO FOLLOW?  Choose fruits, vegetables, whole grains, and low-fat meat, fish, and poultry.   Keep a food diary to identify foods that cause symptoms.  Avoid foods that cause irritation or pain. These may be different for different people.  Eat frequent small meals instead of three large meals each day. The pain may be worse when your stomach is empty.  Avoid eating close to bedtime. WHAT FOODS ARE NOT RECOMMENDED? The following are some foods and drinks that may worsen your symptoms:  Black, white, and red pepper.  Hot sauce.  Chili peppers.  Chili powder.  Chocolate and cocoa.   Alcohol.  Tea, coffee, and cola (regular and decaffeinated). The items listed above may not be a complete list of foods and beverages to avoid. Contact your dietitian for more information. Document Released: 11/19/2011 Document Revised: 09/01/2013 Document Reviewed: 07/01/2013 [facility],TheExitCare Patient Information 2015 [place], [place]. This information is not intended to replace advice given to you by your health care provider. Make sure you discuss any questions you have with your health care provider.

## 2014-03-28 NOTE — ED Provider Notes (Signed)
Chief Complaint   Chief Complaint  Patient presents with  . Abdominal Pain    History of Present Illness   Karina [name] is a 33 year old female who is [redacted] weeks pregnant who presents with a 2 to three-week history of what she calls abdominal pain. Actually the maximum pain is in the left submammary area, just below the breast. It's tender to touch in that area. She states it radiates to the back. She also has had some slight epigastric and upper abdominal pain. This is worse after eating. The pain is rated at 5/10 in intensity. The submammary pain is also worse with deep inspiration. She's had some constipation, diarrhea, but no fever, chills, coughing, wheezing, shortness of breath, or sternal chest pain. She denies any hemoptysis. She's had no leg pain or swelling. She denies any lower bowel pain, dysuria, frequency, urgency, hematuria, vaginal discharge, spotting, or odor. She has some spotting and pain earlier on in the pregnancy but then this went away. She's been followed by Nigel BridgemanVicki Latham [facility]. She did not call them prior to coming here. She is status post cholecystectomy. She's had gastritis, lap band, and fundoplication about 2 years ago.  Review of Systems   Other than as noted above, the patient denies any of the following symptoms: Constitutional:  No fever, chills, weight loss or anorexia. Abdomen:  No nausea, vomiting, hematememesis, melena, diarrhea, or hematochezia. GU:  No dysuria, frequency, urgency, or hematuria. Gyn:  No vaginal discharge, itching, abnormal bleeding, dyspareunia, or pelvic pain.  PMFSH   Past medical history, family history, social history, meds, and allergies were reviewed.   Physical Exam     Vital signs:  BP 112/59  Pulse 81  Temp(Src) 97.9 F (36.6 C) (Oral)  Resp 20  SpO2 100%  LMP 08/19/2013 Gen:  Alert, oriented, in no distress. Lungs:  Breath sounds clear and equal bilaterally.  No wheezes, rales or  rhonchi. Heart:  Regular rhythm.  No gallops or murmers.   Chest: There is tenderness to palpation in the rib cage just below the right breast which completely reproduces her pain. Abdomen:  There is slight epigastric and right upper part of the left upper card and pain to palpation without guarding or rebound. No organomegaly or mass. Murphy sign and Murphy's punch were negative. Bowel sounds were normal. There is a gravid uterus with a fundal height of 32 cm. This is nontender. Skin:  Clear, warm and dry.  No rash.  Labs   Results for orders placed during the hospital encounter of 03/28/14  CBC WITH DIFFERENTIAL      Result Value Ref Range   WBC 11.5 (*) 4.0 - 10.5 K/uL   RBC 4.61  3.87 - 5.11 MIL/uL   Hemoglobin 12.2  12.0 - 15.0 g/dL   HCT 19.135.3 (*) 47.836.0 - 29.546.0 %   MCV 76.6 (*) 78.0 - 100.0 fL   MCH 26.5  26.0 - 34.0 pg   MCHC 34.6  30.0 - 36.0 g/dL   RDW 62.113.4  30.811.5 - 65.715.5 %   Platelets 207  150 - 400 K/uL   Neutrophils Relative % 73  43 - 77 %   Neutro Abs 8.4 (*) 1.7 - 7.7 K/uL   Lymphocytes Relative 20  12 - 46 %   Lymphs Abs 2.3  0.7 - 4.0 K/uL   Monocytes Relative 6  3 - 12 %   Monocytes Absolute 0.6  0.1 - 1.0 K/uL   Eosinophils  Relative 1  0 - 5 %   Eosinophils Absolute 0.1  0.0 - 0.7 K/uL   Basophils Relative 0  0 - 1 %   Basophils Absolute 0.0  0.0 - 0.1 K/uL  POCT URINALYSIS DIP (DEVICE)      Result Value Ref Range   Glucose, UA NEGATIVE  NEGATIVE mg/dL   Bilirubin Urine NEGATIVE  NEGATIVE   Ketones, ur NEGATIVE  NEGATIVE mg/dL   Specific Gravity, Urine 1.015  1.005 - 1.030   Hgb urine dipstick NEGATIVE  NEGATIVE   pH 7.0  5.0 - 8.0   Protein, ur NEGATIVE  NEGATIVE mg/dL   Urobilinogen, UA 0.2  0.0 - 1.0 mg/dL   Nitrite NEGATIVE  NEGATIVE   Leukocytes, UA TRACE (*) NEGATIVE  POCT I-STAT, CHEM 8      Result Value Ref Range   Sodium 137  137 - 147 mEq/L   Potassium 4.1  3.7 - 5.3 mEq/L   Chloride 103  96 - 112 mEq/L   BUN 6  6 - 23 mg/dL   Creatinine, Ser  4.09  0.50 - 1.10 mg/dL   Glucose, Bld 75  70 - 99 mg/dL   Calcium, Ion 8.11  9.14 - 1.23 mmol/L   TCO2 24  0 - 100 mmol/L   Hemoglobin 14.3  12.0 - 15.0 g/dL   HCT 78.2  95.6 - 21.3 %   Course in Urgent Care Center   The patient's obstetrics and gynecology practice was called. They did not think a d-dimer was indicated. They felt that a chest x-ray would not be the best idea.  Assessment   The primary encounter diagnosis was Musculoskeletal chest pain. A diagnosis of Gastritis was also pertinent to this visit.  The pain in the lower rib cage appears to be musculoskeletal, probably due to costochondritis or possibly due to the pressure of a gravid uterus on the rib cage. She also may have some mild gastritis since she has had this before. I doubt pulmonary embolism. I don't think she needs a chest x-ray or a d-dimer, since the ureter these things are recommended by her obstetrics and gynecology practice. Her white blood cell count was mildly elevated, but looking at previous white blood cell counts this seems to be her normal.  Plan     1.  Meds:  The following meds were prescribed:   Discharge Medication List as of 03/28/2014 11:02 AM    START taking these medications   Details  ranitidine (ZANTAC) 150 MG capsule Take 1 capsule (150 mg total) by mouth daily., Starting 03/28/2014, Until Discontinued, Normal        2.  Patient Education/Counseling:  The patient was given appropriate handouts, self care instructions, and instructed in symptomatic relief.  Counseled in dietary treatment for gastritis.  3.  Follow up:  The patient was told to follow up here if no better in 3 to 4 days, or sooner if becoming worse in any way, and given some red flag symptoms such as worsening pain, fever, vomiting, or evidence of GI bleeding which would prompt immediate return.  Follow up with her obstetrics practice next week.    Reuben Likes, MD 03/28/14 2121

## 2014-04-27 ENCOUNTER — Encounter (HOSPITAL_COMMUNITY): Payer: Self-pay | Admitting: *Deleted

## 2014-04-27 ENCOUNTER — Inpatient Hospital Stay (HOSPITAL_COMMUNITY)
Admission: AD | Admit: 2014-04-27 | Discharge: 2014-04-29 | DRG: 781 | Disposition: A | Discharge status: Home / Self Care | Payer: Medicaid Other | Source: Ambulatory Visit | Attending: Obstetrics and Gynecology | Admitting: Obstetrics and Gynecology

## 2014-04-27 ENCOUNTER — Other Ambulatory Visit: Payer: Self-pay | Admitting: Obstetrics and Gynecology

## 2014-04-27 DIAGNOSIS — O99210 Obesity complicating pregnancy, unspecified trimester: Secondary | ICD-10-CM | POA: Diagnosis present

## 2014-04-27 DIAGNOSIS — O99019 Anemia complicating pregnancy, unspecified trimester: Secondary | ICD-10-CM | POA: Diagnosis present

## 2014-04-27 DIAGNOSIS — O24919 Unspecified diabetes mellitus in pregnancy, unspecified trimester: Secondary | ICD-10-CM | POA: Diagnosis present

## 2014-04-27 DIAGNOSIS — J45909 Unspecified asthma, uncomplicated: Secondary | ICD-10-CM | POA: Diagnosis present

## 2014-04-27 DIAGNOSIS — E519 Thiamine deficiency, unspecified: Secondary | ICD-10-CM | POA: Diagnosis present

## 2014-04-27 DIAGNOSIS — O09519 Supervision of elderly primigravida, unspecified trimester: Secondary | ICD-10-CM | POA: Diagnosis present

## 2014-04-27 DIAGNOSIS — Z22330 Carrier of Group B streptococcus: Secondary | ICD-10-CM | POA: Diagnosis not present

## 2014-04-27 DIAGNOSIS — R002 Palpitations: Secondary | ICD-10-CM | POA: Diagnosis present

## 2014-04-27 DIAGNOSIS — O9989 Other specified diseases and conditions complicating pregnancy, childbirth and the puerperium: Secondary | ICD-10-CM

## 2014-04-27 DIAGNOSIS — O99891 Other specified diseases and conditions complicating pregnancy: Secondary | ICD-10-CM | POA: Diagnosis present

## 2014-04-27 DIAGNOSIS — O368190 Decreased fetal movements, unspecified trimester, not applicable or unspecified: Secondary | ICD-10-CM | POA: Diagnosis present

## 2014-04-27 DIAGNOSIS — O4100X0 Oligohydramnios, unspecified trimester, not applicable or unspecified: Principal | ICD-10-CM | POA: Diagnosis present

## 2014-04-27 DIAGNOSIS — O99840 Bariatric surgery status complicating pregnancy, unspecified trimester: Secondary | ICD-10-CM | POA: Diagnosis present

## 2014-04-27 DIAGNOSIS — A6000 Herpesviral infection of urogenital system, unspecified: Secondary | ICD-10-CM | POA: Diagnosis present

## 2014-04-27 DIAGNOSIS — K828 Other specified diseases of gallbladder: Secondary | ICD-10-CM | POA: Diagnosis present

## 2014-04-27 DIAGNOSIS — Z87891 Personal history of nicotine dependence: Secondary | ICD-10-CM

## 2014-04-27 DIAGNOSIS — E669 Obesity, unspecified: Secondary | ICD-10-CM | POA: Diagnosis present

## 2014-04-27 DIAGNOSIS — K219 Gastro-esophageal reflux disease without esophagitis: Secondary | ICD-10-CM | POA: Diagnosis present

## 2014-04-27 DIAGNOSIS — D649 Anemia, unspecified: Secondary | ICD-10-CM | POA: Diagnosis present

## 2014-04-27 DIAGNOSIS — D582 Other hemoglobinopathies: Secondary | ICD-10-CM | POA: Diagnosis present

## 2014-04-27 DIAGNOSIS — E119 Type 2 diabetes mellitus without complications: Secondary | ICD-10-CM | POA: Diagnosis present

## 2014-04-27 DIAGNOSIS — O98519 Other viral diseases complicating pregnancy, unspecified trimester: Secondary | ICD-10-CM | POA: Diagnosis present

## 2014-04-27 DIAGNOSIS — O4103X1 Oligohydramnios, third trimester, fetus 1: Secondary | ICD-10-CM

## 2014-04-27 LAB — CBC
HCT: 33 % — ABNORMAL LOW (ref 36.0–46.0)
Hemoglobin: 11.8 g/dL — ABNORMAL LOW (ref 12.0–15.0)
MCH: 27.2 pg (ref 26.0–34.0)
MCHC: 35.8 g/dL (ref 30.0–36.0)
MCV: 76 fL — ABNORMAL LOW (ref 78.0–100.0)
Platelets: 207 10*3/uL (ref 150–400)
RBC: 4.34 MIL/uL (ref 3.87–5.11)
RDW: 13.5 % (ref 11.5–15.5)
WBC: 11.4 10*3/uL — AB (ref 4.0–10.5)

## 2014-04-27 MED ORDER — PRENATAL MULTIVITAMIN CH
1.0000 | ORAL_TABLET | Freq: Every day | ORAL | Status: DC
  Administered 2014-04-28 – 2014-04-29 (×2): 1 via ORAL
  Filled 2014-04-27 (×2): qty 1

## 2014-04-27 MED ORDER — CALCIUM CARBONATE ANTACID 500 MG PO CHEW: 2.0000 | CHEWABLE_TABLET | ORAL | Status: DC | PRN

## 2014-04-27 MED ORDER — DOCUSATE SODIUM 100 MG PO CAPS
100.0000 mg | ORAL_CAPSULE | Freq: Every day | ORAL | Status: DC
  Administered 2014-04-28: 100 mg via ORAL
  Filled 2014-04-27 (×2): qty 1

## 2014-04-27 MED ORDER — LACTATED RINGERS IV BOLUS (SEPSIS)
500.0000 mL | Freq: Once | INTRAVENOUS | Status: AC
  Administered 2014-04-27: 500 mL via INTRAVENOUS

## 2014-04-27 MED ORDER — ZOLPIDEM TARTRATE 5 MG PO TABS
5.0000 mg | ORAL_TABLET | Freq: Every evening | ORAL | Status: DC | PRN
  Administered 2014-04-28: 5 mg via ORAL
  Filled 2014-04-27: qty 1

## 2014-04-27 MED ORDER — LACTATED RINGERS IV SOLN
INTRAVENOUS | Status: DC
  Administered 2014-04-27 – 2014-04-29 (×5): via INTRAVENOUS

## 2014-04-27 MED ORDER — ACETAMINOPHEN 325 MG PO TABS
650.0000 mg | ORAL_TABLET | ORAL | Status: DC | PRN
Start: 2014-04-27 — End: 2014-04-29

## 2014-04-28 DIAGNOSIS — A6000 Herpesviral infection of urogenital system, unspecified: Secondary | ICD-10-CM

## 2014-04-28 LAB — TYPE AND SCREEN
ABO/RH(D): A POS
Antibody Screen: NEGATIVE

## 2014-04-28 LAB — RPR

## 2014-04-28 LAB — ABO/RH: ABO/RH(D): A POS

## 2014-04-28 NOTE — Progress Notes (Signed)
Hospital day # 1 pregnancy at 2979w0d with oligohydramnios  S: well, reports good fetal activity      Contractions:none      Vaginal bleeding:none now       Vaginal discharge: no significant change  O: BP 104/57  Pulse 68  Temp(Src) 98.7 F (37.1 C) (Oral)  Resp 18  Ht 5\' 5"  (1.651 m)  Wt 267 lb 6.4 oz (121.292 kg)  BMI 44.50 kg/m2  SpO2 97%  LMP 08/19/2013      Fetal tracings:Category 1 with baseline 125-130   reviewed and reassuring      Uterus gravid and non-tender      Extremities: no significant edema and no signs of DVT  A: 7079w0d with oligohydramnios     unchanged  P: continue current plan of care      Repeat ultrasound tomorrow with possible discharge home  [facility] Hospital[facility]IV[facility],Dahlton Hinde A  MD 04/28/2014 11:23 AM

## 2014-04-28 NOTE — Progress Notes (Signed)
Ur chart review completed.  

## 2014-04-28 NOTE — H&P (Signed)
Ward Chatters is a 33 y.o. female, G1P0 at 37 6/7 weeks, presenting for admission due to AFI < 3%ile today in the office, BPP 6/8 due to absent FBM, but had reactive NST.  She is to receive monitoring, IV hydration, and repeat US in 48 hours for reassessment of AFI.  Denies contrations, leaking or bleeding, reports +FM.    Patient Active Problem List   Diagnosis Date Noted  . 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 Oligohydramnios 04/27/2014  . 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 Thiamin deficiency 11/13/2013  . 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 Asthma 11/09/2013  . Obesity complicating pregnancy 11/09/2013  . Anxiety 02/20/2013  . Personal history of infectious and parasitic disease 02/20/2013  . H/O gastrointestinal disease 02/20/2013  . H/O laparoscopic adjustable gastric banding 02/20/2013  . Anemia 02/01/2012  . Biliary dyskinesia 12/06/2011  . Bariatric surgery status 06/11/2011  . Palpitations 04/03/2011    History of present pregnancy: Patient entered care at 21 weeks--started care at [facility].   EDC of 05/25/14 was established by LMP.   Anatomy scan: 23 weeks, with limited cardiac anatomy, breech, anterior placenta. Additional [place] evaluations:   30 1/7 weeks:  EFW 3+5, 53%ile, AFI 40%ile, vtx, still limited views of outflow tracts. Weekly BPPs since 32 weeks [place] 04/27/14:  EFW 6+0, 39.6%ile, vtx, AFI 5.47, < 3%ile, vtx, slightly elevated UA dopplers. Significant prenatal events:  Had been checking CBGs prior to pregnancy and was on Metformin.  Stopped due to low CBGs.  Had normal glucola testing during pregnancy.  Treated for URI during trip home to [place], but maintained a persistent cough.  Referred to pulmonologist, but patient declined referral due to improvement in sx.  Seen at ER at 32 weeks due to gastric fullness--consulted with GI MD, but no therapy/treatment recommended during pregnancy.  Thought to be compression of gastric band from growing uterus.  BPPs weekly from 32 weeks,  with normal findings and normal fluid, until today's [place].  GBS noted on early urine culture.  Normal LFTs   OB History   Grav Para Term Preterm Abortions TAB SAB Ect Mult Living   1              Past Medical History  Diagnosis Date  . Chronic bronchitis   . History of chicken pox   . Asthma     albuterol as needed  . Sleep apnea     cpap  . Diabetes mellitus     takes metformin daily  . Anemia     takes iron daily  . GERD (gastroesophageal reflux disease)     nexium   . Edema extremities     hx of edema in lower ext. takes lasix PRN last dose few weeks ago  . Gallbladder disease   . Pregnant   Negative testing for gestational diabetes during pregnancy. Dx as "pre-diabetic" prior to pregnancy, was on Metformin prior to pregnancy.   Past Surgical History  Procedure Laterality Date  . Carpal tunnel release  2008, 2010  . Cholecystectomy  01/11/2012    Procedure: LAPAROSCOPIC CHOLECYSTECTOMY WITH INTRAOPERATIVE CHOLANGIOGRAM;  Surgeon: Cherylynn Ridges, MD;  Location: [facility] OR;  Service: General;  Laterality: N/A;  . Laparoscopic gastric banding    . No past surgeries     Family History: family history includes Anesthesia problems in her mother; Cervical cancer in her maternal aunt; Diabetes in her sister; Heart disease in her other; Hyperlipidemia in her other; Hypertension in her other; Kidney disease in  her other; Liver cancer in her paternal grandfather and paternal uncle; Ovarian cancer in her maternal aunt; Prostate cancer in her maternal uncle; Stroke in her other; Thyroid disease in her other. There is no history of Hypotension, Malignant hyperthermia, or Pseudochol deficiency.  Social History:  reports that she has quit smoking. She has never used smokeless tobacco. She reports that she does not drink alcohol or use illicit drugs.   Husband remains in [place], unable to get a visa to come to [place].   Prenatal Transfer Tool  Maternal Diabetes: No--on Metformin prior to  pregnancy due to "pre-diabetes", but no dx of diabetes/gestational diabetes during pregnancy Genetic Screening: Normal Quad screen Maternal Ultrasounds/Referrals: Abnormal:  Findings:   Other:Oligohydramnios today Fetal Ultrasounds or other Referrals:  None Maternal Substance Abuse:  No Significant Maternal Medications:  None Significant Maternal Lab Results: Lab values include: Group B Strep positive    ROS:  +FM  No Known Allergies     Blood pressure 100/50, pulse 68, temperature 98.1 F (36.7 C), temperature source Oral, resp. rate 20, height 5\' 5"  (1.651 m), weight 266 lb (120.657 kg), last menstrual period 08/19/2013, SpO2 97.00%.  Chest clear Heart RRR without murmur Abd gravid, NT, FH 38 cm Pelvic: Deferred Ext: DTR 2+, no clonus, 1+ edema  FHR: Category 1 UCs:  Irregular, mild, irritability  Prenatal labs: ABO, Rh: --/--/A POS, A POS (08/18 1945) Antibody: NEG (08/18 1945) Rubella:   Immune RPR: NON REAC (08/18 1945)  HBsAg: NEGATIVE (03/02 1420)  HIV: NON REACTIVE (03/02 1420)  GBS:  Positive on NOB urine Sickle cell/Hgb electrophoresis:  Hgb AC GC:  Negative 11/09/13 Chlamydia:  Negative 11/09/13 Genetic screenings:  Normal Quad screen Glucola:  WNL x 2 Other:  Hgb 13.1 at NOB, 12.6 at 28 weeks       Assessment/Plan: IUP at 35 6/7 weeks Oligohydramnios GBS positive Hx bariatric surgery Obesity  Plan: Admit to Antenatal Unit per consult with Dr. Estanislado Pandyivard IV hydration Continuous monitoring at present. Routine antenatal orders Repeat limited OB US in 48 hours for recheck of fluid. Plan of care reviewed with patient.  She seems to understand and is agreeable.  LATHAM, VICKICNM, MN 04/28/2014, 5:55 AM

## 2014-04-29 ENCOUNTER — Inpatient Hospital Stay (HOSPITAL_COMMUNITY): Payer: Medicaid Other

## 2014-04-29 NOTE — Progress Notes (Signed)
Patient discharged home with friend at side. Patient denies any c/o. Reports good fetal movement. Provided teachback for discharge instructions. Pt will follow up on Tuesday in the office and return as needed.

## 2014-04-29 NOTE — Discharge Summary (Signed)
  Physician Discharge Summary  Patient ID: Karina [name] MRN: [id number] DOB/AGE: March 07, 1981 33 y.o.  Admit date: 04/27/2014 Discharge date: 04/29/2014  Admission Diagnoses: oligohydramnios and decreased fetal movement  Discharge Diagnoses:  Active Problems:   Palpitations   Biliary dyskinesia   Anemia   Asthma   Hemoglobin C (Hb-C)   Thiamin deficiency   Oligohydramnios   Genital HSV   Discharged Condition: stable  Hospital Course: IVF, fetal monitor and BPP/AFI  Consults: Dr Sallye OberKulwa and MFM  Significant Diagnostic Studies: BPP 8/9 AFI 5.67, NST reactive and reassuring  Treatments: IV hydration  Discharge Exam: Blood pressure 130/62, pulse 70, temperature 98.2 F (36.8 C), temperature source Oral, resp. rate 18, height 5\' 5"  (1.651 m), weight 267 lb 6.4 oz (121.292 kg), last menstrual period 08/19/2013, SpO2 97.00%.  General appearance: alert and cooperative Extremities: extremities normal, atraumatic, no cyanosis or edema Skin: Skin color, texture, turgor normal. No rashes or lesions  Disposition: 01-Home or Self Care  Discharge Instructions   Discharge patient    Complete by:  As directed             Medication List         albuterol 108 (90 BASE) MCG/ACT inhaler  Commonly known as:  PROVENTIL HFA;VENTOLIN HFA  Inhale into the lungs every 6 (six) hours as needed for wheezing or shortness of breath.     calcium carbonate 500 MG chewable tablet  Commonly known as:  TUMS - dosed in mg elemental calcium  Chew 1 tablet by mouth daily as needed for indigestion.     multivitamin-prenatal 27-0.8 MG Tabs tablet  Take 1 tablet by mouth daily at 12 noon.     ranitidine 150 MG capsule  Commonly known as:  ZANTAC  Take 1 capsule (150 mg total) by mouth daily.     thiamine 100 MG tablet  Commonly known as:  VITAMIN B-1  Take 100 mg by mouth daily.           Follow-up Information   Call [facility]. (Call with any questions or  concerns.  Tuesday and Friday testing)    Specialty:  Obstetrics and Gynecology   Contact information:   [address] [place] [id number] [phone]     Per MFM recommendation: Bi weekly NST on Tuesday and Fridays. Weekly US for AFI on Tuesdays    Signed: Anna-Marie Coller, CNM, MSN 04/29/2014, 12:20 PM

## 2014-04-29 NOTE — Progress Notes (Signed)
Hospital day # 2 pregnancy at [redacted]w[redacted]d-oligo.  S:  Doing well.  Denies vb, lof or ctx w/+FM       O: BP 130/62  Pulse 70  Temp(Src) 98.2 F (36.8 C) (Oral)  Resp 18  Ht '5\' 5"'  (1.651 m)  Wt 267 lb 6.4 oz (121.292 kg)  BMI 44.50 kg/m2  SpO2 97%  LMP 08/19/2013      Fetal tracings:       Contractions:   non      Uterus gravid and non-tender      Extremities: extremities normal, atraumatic, no cyanosis or edema and no significant edema and no signs of DVT     [place] 8/8 and AFI 5.67 which is increase slightly from a BPP 6/7 and the AFI 5.47 on 8/18 in the office       Labs:   Recent Results (from the past 2160 hour(s))  URINALYSIS, ROUTINE W REFLEX MICROSCOPIC     Status: Abnormal   Collection Time    03/14/14  9:56 AM      Result Value Ref Range   Color, Urine YELLOW  YELLOW   APPearance CLOUDY (*) CLEAR   Specific Gravity, Urine 1.020  1.005 - 1.030   pH 7.5  5.0 - 8.0   Glucose, UA NEGATIVE  NEGATIVE mg/dL   Hgb urine dipstick NEGATIVE  NEGATIVE   Bilirubin Urine NEGATIVE  NEGATIVE   Ketones, ur NEGATIVE  NEGATIVE mg/dL   Protein, ur NEGATIVE  NEGATIVE mg/dL   Urobilinogen, UA 1.0  0.0 - 1.0 mg/dL   Nitrite NEGATIVE  NEGATIVE   Leukocytes, UA MODERATE (*) NEGATIVE  URINE MICROSCOPIC-ADD ON     Status: Abnormal   Collection Time    03/14/14  9:56 AM      Result Value Ref Range   Squamous Epithelial / LPF FEW (*) RARE   WBC, UA 3-6  <3 WBC/hpf   Urine-Other MUCOUS PRESENT    BASIC METABOLIC PANEL     Status: Abnormal   Collection Time    03/14/14 10:12 AM      Result Value Ref Range   Sodium 136 (*) 137 - 147 mEq/L   Potassium 4.2  3.7 - 5.3 mEq/L   Chloride 100  96 - 112 mEq/L   CO2 23  19 - 32 mEq/L   Glucose, Bld 78  70 - 99 mg/dL   BUN 7  6 - 23 mg/dL   Creatinine, Ser 0.60  0.50 - 1.10 mg/dL   Calcium 9.0  8.4 - 10.5 mg/dL   GFR calc non Af Amer >90  >90 mL/min   GFR calc Af Amer >90  >90 mL/min   Comment: (NOTE)     The eGFR has been calculated using the  CKD EPI equation.     This calculation has not been validated in all clinical situations.     eGFR's persistently <90 mL/min signify possible Chronic Kidney     Disease.   Anion gap 13  5 - 15  CBC WITH DIFFERENTIAL     Status: Abnormal   Collection Time    03/28/14 10:00 AM      Result Value Ref Range   WBC 11.5 (*) 4.0 - 10.5 K/uL   RBC 4.61  3.87 - 5.11 MIL/uL   Hemoglobin 12.2  12.0 - 15.0 g/dL   HCT 35.3 (*) 36.0 - 46.0 %   MCV 76.6 (*) 78.0 - 100.0 fL   MCH 26.5  26.0 - 34.0  pg   MCHC 34.6  30.0 - 36.0 g/dL   RDW 13.4  11.5 - 15.5 %   Platelets 207  150 - 400 K/uL   Neutrophils Relative % 73  43 - 77 %   Neutro Abs 8.4 (*) 1.7 - 7.7 K/uL   Lymphocytes Relative 20  12 - 46 %   Lymphs Abs 2.3  0.7 - 4.0 K/uL   Monocytes Relative 6  3 - 12 %   Monocytes Absolute 0.6  0.1 - 1.0 K/uL   Eosinophils Relative 1  0 - 5 %   Eosinophils Absolute 0.1  0.0 - 0.7 K/uL   Basophils Relative 0  0 - 1 %   Basophils Absolute 0.0  0.0 - 0.1 K/uL  POCT URINALYSIS DIP (DEVICE)     Status: Abnormal   Collection Time    03/28/14 10:36 AM      Result Value Ref Range   Glucose, UA NEGATIVE  NEGATIVE mg/dL   Bilirubin Urine NEGATIVE  NEGATIVE   Ketones, ur NEGATIVE  NEGATIVE mg/dL   Specific Gravity, Urine 1.015  1.005 - 1.030   Hgb urine dipstick NEGATIVE  NEGATIVE   pH 7.0  5.0 - 8.0   Protein, ur NEGATIVE  NEGATIVE mg/dL   Urobilinogen, UA 0.2  0.0 - 1.0 mg/dL   Nitrite NEGATIVE  NEGATIVE   Leukocytes, UA TRACE (*) NEGATIVE   Comment: Biochemical Testing Only. Please order routine urinalysis from main lab if confirmatory testing is needed.  POCT I-STAT, CHEM 8     Status: None   Collection Time    03/28/14 10:38 AM      Result Value Ref Range   Sodium 137  137 - 147 mEq/L   Potassium 4.1  3.7 - 5.3 mEq/L   Chloride 103  96 - 112 mEq/L   BUN 6  6 - 23 mg/dL   Creatinine, Ser 0.60  0.50 - 1.10 mg/dL   Glucose, Bld 75  70 - 99 mg/dL   Calcium, Ion 1.22  1.12 - 1.23 mmol/L   TCO2 24   0 - 100 mmol/L   Hemoglobin 14.3  12.0 - 15.0 g/dL   HCT 42.0  36.0 - 46.0 %  CBC     Status: Abnormal   Collection Time    04/27/14  7:45 PM      Result Value Ref Range   WBC 11.4 (*) 4.0 - 10.5 K/uL   RBC 4.34  3.87 - 5.11 MIL/uL   Hemoglobin 11.8 (*) 12.0 - 15.0 g/dL   HCT 33.0 (*) 36.0 - 46.0 %   MCV 76.0 (*) 78.0 - 100.0 fL   MCH 27.2  26.0 - 34.0 pg   MCHC 35.8  30.0 - 36.0 g/dL   RDW 13.5  11.5 - 15.5 %   Platelets 207  150 - 400 K/uL  RPR     Status: None   Collection Time    04/27/14  7:45 PM      Result Value Ref Range   RPR NON REAC  NON REAC   Comment: Performed at [facility]     Status: None   Collection Time    04/27/14  7:45 PM      Result Value Ref Range   ABO/RH(D) A POS     Antibody Screen NEG     Sample Expiration 04/30/2014    ABO/RH     Status: None   Collection Time  04/27/14  7:45 PM      Result Value Ref Range   ABO/RH(D) A POS            A: 5w1dwith oligohydramnios     Condition unchanged     Fetal tracings: Category 1--baseline 135, moderate variables, +accels and no decels noted      Contractions:     Uterus non-tender      Extremities: no significant edema, no signs of DVT   P: Continue current plan of care      Possible discharge today      Consulted with Dr KAlesia Richards     MDs will follow   Lajoyce Tamura, CNM, MSN 04/29/2014. 10:04 AM

## 2014-04-29 NOTE — Discharge Instructions (Signed)
Fetal Biophysical Profile °This is a test that measures five different variables of the fetus: Heart rate, breathing movement, total movement of the baby, fetal muscle tone, the amount of amniotic fluid, and the heart rate activity of the fetus. The five variables are measured individually and contribute either a 2 or a 0 to the overall scoring of the test. The measurements are as follows: °· Fetal heart rate activity. This is measured and scored in the same way as a non-stress test. The fetal heart rate is considered reactive when there are movement-associated fetal heart rate increases of at least 15 beats per minute above baseline, and 15 seconds in duration over a 20-minute period. A score of 2 is given for reactivity, and a score of 0 indicates that the fetal heart rate is non-reactive. °· Fetal breathing movements. This is scored based on fetal breathing movements and indicate fetal well-being. Their absence may indicate a low oxygen level for the fetus. Fetal breathing increases in frequency and uniformity after the 36th week of pregnancy. To earn a score of 2, the fetus must have at least one episode of fetal breathing lasting at least 60 seconds within a 30-minute observation. Absence of this breathing is scored a 0 on the BPP. °· Fetal body movements. Fetal activity is a reflection of brain integrity and function. The presence of at least three episodes of fetal movements within a 30-minute period is given a score of 2. A score of 0 is given with two or less movements in this time period. Fetal activity is highest 1 to 3 hours after the mother has eaten a meal. °· Fetal tone. In the uterus, the fetus is normally in a position of flexion. This means the head is bent down towards the knees. The fetus also stretches, rolls, and moves in the uterus. The arms, legs, trunk, and head may be flexed and extended. A score of 2 is earned when there is at least one episode of active extension with return flexion. A  score of 0 is given for slow extension with a return to only partial flexion. Fetal movement not followed by return to flexion, limbs or spine in extension, and an open fetal hand score 0. °· Amniotic fluid volume. Amniotic fluid volume has been demonstrated to be a good method of predicting fetal distress. Too little amniotic fluid has been associated with fetal abnormalities, slow uterine growth, and over due pregnancy. A score of 2 is given for this when there is at least one pocket of amniotic fluid that measures 1 cm in a specific area. A score of 0 indicates either that fluid is absent in most areas of the uterine cavity or that the largest pocket of fluid measures less than 1 cm. °PREPARATION FOR TEST °No preparation or fasting is necessary. °NORMAL FINDINGS °· A score of 8-10 points (if amniotic fluid volume is adequate). °· Possible critical values: Less than 4 may necessitate immediate delivery of fetus. °Ranges for normal findings may vary among different laboratories and hospitals. You should always check with your doctor after having lab work or other tests done to discuss the meaning of your test results and whether your values are considered within normal limits. °MEANING OF TEST  °Your caregiver will go over the test results with you and discuss the importance and meaning of your results, as well as treatment options and the need for additional tests if necessary. °OBTAINING THE TEST RESULTS  °It is your responsibility to obtain your test   results. Ask the lab or department performing the test when and how you will get your results. Document Released: 12/28/2004 Document Revised: 11/19/2011 Document Reviewed: 08/06/2008 [facility] Patient Information 2015 [place], [place]. This information is not intended to replace advice given to you by your health care provider. Make sure you discuss any questions you have with your health care provider.  Oligohydramnios An unborn baby (fetus) lives in the  mother's uterus in a sac of amniotic fluid. This fluid:  Protects the fetus from trauma.  Helps the fetus move freely inside the uterus.  Helps the fetal lungs, kidneys, and digestive system develop.  Protects the baby from infections. Oligohydramnios is when there is not enough amniotic fluid in the amniotic sac. If this happens early in pregnancy, a fetus might not develop normally. If this happens in the second half of a pregnancy, a fetus might not grow as much as it should and could cause problems during delivery.  Oligohydramnios can also cause:  Pregnancy loss (miscarriage).  Premature birth.  Deformities of the face or body.  Problems with muscles and bones.  Pressure and compression on the umbilical cord, which decreases oxygen to the fetus.  Lung problems.  Stillbirth. CAUSES A cause cannot always be found.However, possible causes include:  A leak or a tear in the amniotic sac.  A problem with the placenta.  Having identical twins who share the same placenta.  A fetal birth defect. This is usually something in the fetal kidneys or urinary tract that has not developed as it should.  A pregnancy that goes past the due date.  Something that affects the mother's body, such as:  Dehydration.  High blood pressure.  Diabetes.  Some medications (examples include ibuprofen and blood pressure medicines).  A disease that affects the skin, joints, kidneys, and other organs (systemic lupus).  Birth defects. SYMPTOMS  There may be no symptoms.  Leaking fluid from the vagina.  Measuring smaller than usual uterus at a routine pregnancy exam. DIAGNOSIS To diagnose and evaluate oligohydramnios, your caregiver may:  Order a prenatal ultrasound test. This test:  Measures the amniotic fluid level. This will show if the amount of fluid is right for the stage of pregnancy.  Checks the fetal kidneys.  Checks fetal growth.  Evaluates the  placenta.  Confirm that you broke your water, if this is suspected by your caregiver.  Order a nonstress test. This noninvasive test is an assessment of fetal well-being.It monitors the fetal heart rate patterns over a 20-minute period.  Order a biophysical profile. This test measures and evaluates 5 observations of the baby (results of nonstress testing, fetal breathing, movements, muscle tone, and amount of amniotic fluid).  Assess fetal kick counts. Tell your caregiver if the baby appears to become less active.  Order a uterine artery Doppler study. This is a type of ultrasound. It can show if enough blood and nourishment are getting to the fetus through the placenta and umbilical cord.  Check your blood pressure.  Check your blood sugar. TREATMENT Treatment will depend on how low the fluid is, how far along in the pregnancy you are, and your overall health. Treatment options include:  Watching and waiting. You will need to be checked more often than normal.  Increasing your fluid intake. This may be done by mouth, or you might get the fluids through the vein (intravenously, IV).  Delivering the baby if recommended by your caregiver. HOME CARE INSTRUCTIONS  Only take medicine as directed by your  caregiver, especially if you have a medical problem (diabetes, high blood pressure).  Follow your caregiver's instructions regarding physical activity, especially if you have a medical problem (diabetes, high blood pressure).  Keep all prenatal care appointments.  Rest as much as possible. Your caregiver may put you on bed rest.  Drink enough fluids to keep your urine clear or pale yellow.  Eat a healthy and nourishing diet.  Do not smoke, drink alcohol, or take illegal drugs. SEEK MEDICAL CARE IF:  You have any questions or worries about your pregnancy.  You notice a decrease in fetal movement. SEEK IMMEDIATE MEDICAL CARE IF:   Fluid comes out of your vagina.  You start to  have labor pains (contractions).  You have a fever. Document Released: 12/12/2010 Document Revised: 01/11/2014 Document Reviewed: 12/12/2010 [facility] Patient Information 2015 [place]. This information is not intended to replace advice given to you by your health care provider. Make sure you discuss any questions you have with your health care provider.

## 2014-04-30 NOTE — Discharge Summary (Signed)
I agree with note per CNM Venus Standard above.   I discussed case with MFM Dr. Sherrie Georgeecker and she also recommended that if bordeline fluids continue (AFI between 5 and 6), may consider induction at [redacted] weeks EGA.

## 2014-05-11 ENCOUNTER — Inpatient Hospital Stay (HOSPITAL_COMMUNITY)
Admission: AD | Admit: 2014-05-11 | Discharge: 2014-05-15 | DRG: 774 | Disposition: A | Discharge status: Home / Self Care | Payer: Medicaid Other | Source: Ambulatory Visit | Attending: Obstetrics & Gynecology | Admitting: Obstetrics & Gynecology

## 2014-05-11 ENCOUNTER — Encounter (HOSPITAL_COMMUNITY): Payer: Self-pay | Admitting: *Deleted

## 2014-05-11 DIAGNOSIS — O9989 Other specified diseases and conditions complicating pregnancy, childbirth and the puerperium: Secondary | ICD-10-CM

## 2014-05-11 DIAGNOSIS — Z6841 Body Mass Index (BMI) 40.0 and over, adult: Secondary | ICD-10-CM | POA: Diagnosis not present

## 2014-05-11 DIAGNOSIS — O09519 Supervision of elderly primigravida, unspecified trimester: Secondary | ICD-10-CM

## 2014-05-11 DIAGNOSIS — Z8049 Family history of malignant neoplasm of other genital organs: Secondary | ICD-10-CM

## 2014-05-11 DIAGNOSIS — O99214 Obesity complicating childbirth: Secondary | ICD-10-CM

## 2014-05-11 DIAGNOSIS — K219 Gastro-esophageal reflux disease without esophagitis: Secondary | ICD-10-CM | POA: Diagnosis present

## 2014-05-11 DIAGNOSIS — Z22330 Carrier of Group B streptococcus: Secondary | ICD-10-CM | POA: Diagnosis not present

## 2014-05-11 DIAGNOSIS — A6000 Herpesviral infection of urogenital system, unspecified: Secondary | ICD-10-CM | POA: Diagnosis present

## 2014-05-11 DIAGNOSIS — O9902 Anemia complicating childbirth: Secondary | ICD-10-CM | POA: Diagnosis present

## 2014-05-11 DIAGNOSIS — O4100X0 Oligohydramnios, unspecified trimester, not applicable or unspecified: Secondary | ICD-10-CM | POA: Diagnosis present

## 2014-05-11 DIAGNOSIS — E669 Obesity, unspecified: Secondary | ICD-10-CM | POA: Diagnosis present

## 2014-05-11 DIAGNOSIS — O99844 Bariatric surgery status complicating childbirth: Secondary | ICD-10-CM | POA: Diagnosis present

## 2014-05-11 DIAGNOSIS — D649 Anemia, unspecified: Secondary | ICD-10-CM | POA: Diagnosis present

## 2014-05-11 DIAGNOSIS — D582 Other hemoglobinopathies: Secondary | ICD-10-CM

## 2014-05-11 DIAGNOSIS — Z8041 Family history of malignant neoplasm of ovary: Secondary | ICD-10-CM | POA: Diagnosis not present

## 2014-05-11 DIAGNOSIS — E519 Thiamine deficiency, unspecified: Secondary | ICD-10-CM

## 2014-05-11 DIAGNOSIS — O99892 Other specified diseases and conditions complicating childbirth: Secondary | ICD-10-CM | POA: Diagnosis present

## 2014-05-11 DIAGNOSIS — Z87891 Personal history of nicotine dependence: Secondary | ICD-10-CM

## 2014-05-11 DIAGNOSIS — Z833 Family history of diabetes mellitus: Secondary | ICD-10-CM

## 2014-05-11 DIAGNOSIS — Z823 Family history of stroke: Secondary | ICD-10-CM

## 2014-05-11 DIAGNOSIS — Z800 Family history of malignant neoplasm of digestive organs: Secondary | ICD-10-CM | POA: Diagnosis not present

## 2014-05-11 DIAGNOSIS — O98519 Other viral diseases complicating pregnancy, unspecified trimester: Secondary | ICD-10-CM | POA: Diagnosis present

## 2014-05-11 DIAGNOSIS — B951 Streptococcus, group B, as the cause of diseases classified elsewhere: Secondary | ICD-10-CM | POA: Diagnosis present

## 2014-05-11 LAB — RPR

## 2014-05-11 LAB — TYPE AND SCREEN
ABO/RH(D): A POS
ANTIBODY SCREEN: NEGATIVE

## 2014-05-11 LAB — CBC
HCT: 32.6 % — ABNORMAL LOW (ref 36.0–46.0)
Hemoglobin: 11.8 g/dL — ABNORMAL LOW (ref 12.0–15.0)
MCH: 27.2 pg (ref 26.0–34.0)
MCHC: 36.2 g/dL — ABNORMAL HIGH (ref 30.0–36.0)
MCV: 75.1 fL — AB (ref 78.0–100.0)
PLATELETS: 204 10*3/uL (ref 150–400)
RBC: 4.34 MIL/uL (ref 3.87–5.11)
RDW: 13.8 % (ref 11.5–15.5)
WBC: 10.4 10*3/uL (ref 4.0–10.5)

## 2014-05-11 LAB — AMNISURE RUPTURE OF MEMBRANE (ROM) NOT AT ARMC: AMNISURE: NEGATIVE

## 2014-05-11 MED ORDER — LIDOCAINE HCL (PF) 1 % IJ SOLN
30.0000 mL | INTRAMUSCULAR | Status: AC | PRN
  Administered 2014-05-12 (×2): 4 mL via SUBCUTANEOUS

## 2014-05-11 MED ORDER — MISOPROSTOL 25 MCG QUARTER TABLET
25.0000 ug | ORAL_TABLET | ORAL | Status: DC | PRN
  Administered 2014-05-11 – 2014-05-12 (×4): 25 ug via VAGINAL
  Filled 2014-05-11: qty 1
  Filled 2014-05-11 (×4): qty 0.25

## 2014-05-11 MED ORDER — OXYCODONE-ACETAMINOPHEN 5-325 MG PO TABS: 1.0000 | ORAL_TABLET | ORAL | Status: DC | PRN

## 2014-05-11 MED ORDER — PENICILLIN G POTASSIUM 5000000 UNITS IJ SOLR
2.5000 10*6.[IU] | INTRAVENOUS | Status: DC
  Filled 2014-05-11 (×3): qty 2.5

## 2014-05-11 MED ORDER — ZOLPIDEM TARTRATE 5 MG PO TABS: 5.0000 mg | ORAL_TABLET | Freq: Every evening | ORAL | Status: DC | PRN

## 2014-05-11 MED ORDER — IBUPROFEN 600 MG PO TABS: 600.0000 mg | ORAL_TABLET | Freq: Four times a day (QID) | ORAL | Status: DC | PRN

## 2014-05-11 MED ORDER — LACTATED RINGERS IV SOLN: 500.0000 mL | INTRAVENOUS | Status: DC | PRN

## 2014-05-11 MED ORDER — LACTATED RINGERS IV SOLN
INTRAVENOUS | Status: DC
  Administered 2014-05-12 – 2014-05-13 (×4): via INTRAVENOUS

## 2014-05-11 MED ORDER — SODIUM CHLORIDE 0.9 % IJ SOLN: 3.0000 mL | Freq: Two times a day (BID) | INTRAMUSCULAR | Status: DC

## 2014-05-11 MED ORDER — OXYTOCIN BOLUS FROM INFUSION: 500.0000 mL | INTRAVENOUS | Status: DC

## 2014-05-11 MED ORDER — ACETAMINOPHEN 325 MG PO TABS: 650.0000 mg | ORAL_TABLET | ORAL | Status: DC | PRN

## 2014-05-11 MED ORDER — SODIUM CHLORIDE 0.9 % IV SOLN: 250.0000 mL | INTRAVENOUS | Status: DC | PRN

## 2014-05-11 MED ORDER — SODIUM CHLORIDE 0.9 % IJ SOLN
3.0000 mL | INTRAMUSCULAR | Status: DC | PRN
  Administered 2014-05-12: 3 mL via INTRAVENOUS

## 2014-05-11 MED ORDER — PENICILLIN G POTASSIUM 5000000 UNITS IJ SOLR
5.0000 10*6.[IU] | Freq: Once | INTRAVENOUS | Status: DC
  Filled 2014-05-11: qty 5

## 2014-05-11 MED ORDER — CITRIC ACID-SODIUM CITRATE 334-500 MG/5ML PO SOLN
30.0000 mL | ORAL | Status: DC | PRN
  Filled 2014-05-11: qty 15

## 2014-05-11 MED ORDER — OXYTOCIN 40 UNITS IN LACTATED RINGERS INFUSION - SIMPLE MED
1.0000 m[IU]/min | INTRAVENOUS | Status: DC
Start: 2014-05-11 — End: 2014-05-13
  Administered 2014-05-12: 1 m[IU]/min via INTRAVENOUS
  Filled 2014-05-11: qty 1000

## 2014-05-11 MED ORDER — BUTORPHANOL TARTRATE 1 MG/ML IJ SOLN
1.0000 mg | INTRAMUSCULAR | Status: DC | PRN
  Administered 2014-05-12 (×3): 1 mg via INTRAVENOUS
  Filled 2014-05-11 (×3): qty 1

## 2014-05-11 MED ORDER — OXYTOCIN 40 UNITS IN LACTATED RINGERS INFUSION - SIMPLE MED: 62.5000 mL/h | INTRAVENOUS | Status: DC

## 2014-05-11 MED ORDER — FLEET ENEMA 7-19 GM/118ML RE ENEM: 1.0000 | ENEMA | RECTAL | Status: DC | PRN

## 2014-05-11 MED ORDER — ONDANSETRON HCL 4 MG/2ML IJ SOLN: 4.0000 mg | Freq: Four times a day (QID) | INTRAMUSCULAR | Status: DC | PRN

## 2014-05-11 MED ORDER — TERBUTALINE SULFATE 1 MG/ML IJ SOLN: 0.2500 mg | Freq: Once | INTRAMUSCULAR | Status: AC | PRN

## 2014-05-11 NOTE — Progress Notes (Signed)
  Subjective: -Patient comfortable in bed.  Reports " I am just taking it all in."  Requests to ambulate around unit.   Objective: BP 114/63  Pulse 72  Temp(Src) 98.4 F (36.9 C) (Oral)  Resp 20  Ht  (1.651 m)  Wt 267 lb (121.11 kg)  BMI 44.43 kg/m2  SpO2 99%  LMP 08/19/2013     FHT:  135 bpm, Mod Var, -Decels, +Accels UC:   None graphed or palpated SVE:  Deferred Membranes: Intact Pitocin:None Cytotec placed at 1900  Assessment:  IUP at 37.6wks Cat I FT  Oligohydramnios  Cervical Ripening  Plan: -Discussed POC for tonight with patient; cytotec, foley bulb, and/or pitocin -Okay to ambulate around unit with wireless monitors -Okay to get up and shower without monitoring -Will place next dose cytotec or foley bulb at 2300 -Continue other mgmt as ordered  Tanairy Payeur LYNN,CNM, MSN 05/11/2014, 9:10 PM

## 2014-05-11 NOTE — Progress Notes (Signed)
Ward Chatters [id number]  Subjective: -Patient without questions or concerns, at current.   Objective: BP 114/63  Pulse 72  Temp(Src) 98.4 F (36.9 C) (Oral)  Resp 20  Ht  (1.651 m)  Wt 267 lb (121.11 kg)  BMI 44.43 kg/m2  SpO2 99%  LMP 08/19/2013     FHT:  bpm, Mod Var, -Decels, +Accels UC:   Q 2-73min, palpates mild SVE:   Dilation: Fingertip Effacement (%): 70 Station: -2 Exam by:: Kyrillos Adams, CNM Membranes: Intact Pitocin: None 3rd Dose Cytotec Placed w/o difficulty  Assessment:  IUP at 37.6wks Cat I FT  Oligo Cervical Ripening  Plan: -Cytotec placed -Will reasses in 4 hours for additional cytotec or foley bulb placement -Continue other mgmt as ordered  Darcey Demma LYNN,CNM, MSN 05/11/2014, 11:09 PM

## 2014-05-11 NOTE — Progress Notes (Signed)
EFM removed. Pt instructed she may ambulate and shower.  Informed FHR to be dopplered @ 2130.

## 2014-05-11 NOTE — H&P (Signed)
Karina [name] is a 33 y.o. female, G1P0 at 3 6/7 weeks, presenting for admission due to AFI 4.58 today in the office, BPP 8/8, reactive NST.  She had oligo noted on [place] on 8/19, with AFI 5.47, slightly elevated dopplers.  She was admitted for 48 hours, with IV hydration administered.  Repeat AFI on 8/20 was 5.67, with BPP 8/8 and reactive NST.  She was evaluated weekly since that time, with stable AFI until today.  Today, her cervix was closed, soft, on office exam.  Amnisure was negative in MAU today. Dr. Claudean Severance, MFM, was consulted, and he recommended patient be admitted for induction.  Patient Active Problem List   .  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  Oligohydramnios  04/27/2014   .  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  Thiamin deficiency  11/13/2013   .  Elderly primigravida  11/09/2013   .  Asthma  11/09/2013   .  Obesity complicating pregnancy  11/09/2013   .  Anxiety  02/20/2013   .  Personal history of infectious and parasitic disease  02/20/2013   .  H/O gastrointestinal disease  02/20/2013   .  H/O laparoscopic adjustable gastric banding  02/20/2013   .  Anemia  02/01/2012   .  Biliary dyskinesia  12/06/2011   .  Bariatric surgery status  06/11/2011   .  Palpitations  04/03/2011      History of present pregnancy: Patient entered care at 21 weeks--started care at [facility].    EDC of 05/25/14 was established by LMP.    Anatomy scan: 23 weeks, with limited cardiac anatomy, breech, anterior placenta. Additional [place] evaluations:    30 1/7 weeks:  EFW 3+5, 53%ile, AFI 40%ile, vtx, still limited views of outflow tracts. Weekly BPPs since 32 weeks [place] 04/27/14:  EFW 6+0, 39.6%ile, vtx, AFI 5.47, < 3%ile, vtx, slightly elevated UA dopplers. 05/04/14:  Vtx, normal fluid, with AFI 40%ile.   Today:  AFI 4.58, vtx.  Significant prenatal events:  Had been checking CBGs prior to pregnancy and was on Metformin.  Stopped due to low CBGs.  Had normal glucola testing during  pregnancy.  Treated for URI during trip home to [place], but maintained a persistent cough.  Referred to pulmonologist, but patient declined referral due to improvement in sx.  Seen at ER at 32 weeks due to gastric fullness--consulted with GI MD, but no therapy/treatment recommended during pregnancy.  Thought to be compression of gastric band from growing uterus.  BPPs weekly from 32 weeks, with normal findings and normal fluid, until oligo noted on 8/18 [place].  F/u BPPs were WNL, until today's finding of further decreased fluid.  GBS noted on early urine culture.  Normal LFTs .    OB History     Grav  Para  Term  Preterm  Abortions  TAB  SAB  Ect  Mult  Living     1                         Past Medical History   Diagnosis  Date   .  Chronic bronchitis     .  History of chicken pox     .  Asthma         albuterol as needed   .  Sleep apnea         cpap   .  Diabetes mellitus  takes metformin daily prior to pregnancy .  Anemia         takes iron daily   .  GERD (gastroesophageal reflux disease)         nexium    .  Edema extremities         hx of edema in lower ext. takes lasix PRN prior to pregnancy .  Gallbladder disease     .  Pregnant      Negative testing for gestational diabetes during pregnancy. Dx as "pre-diabetic" prior to pregnancy, was on Metformin prior to pregnancy.     Past Surgical History   Procedure  Laterality  Date   .  Carpal tunnel release    2008, 2010   .  Cholecystectomy    01/11/2012       Procedure: LAPAROSCOPIC CHOLECYSTECTOMY WITH INTRAOPERATIVE CHOLANGIOGRAM;  Surgeon: Cherylynn Ridges, MD;  Location: [facility] OR;  Service: General;  Laterality: N/A;   .  Laparoscopic gastric banding       .  No past surgeries        Family History: family history includes Anesthesia problems in her mother; Cervical cancer in her maternal aunt; Diabetes in her sister; Heart disease in her other; Hyperlipidemia in her other; Hypertension in her other; Kidney disease  in her other; Liver cancer in her paternal grandfather and paternal uncle; Ovarian cancer in her maternal aunt; Prostate cancer in her maternal uncle; Stroke in her other; Thyroid disease in her other. There is no history of Hypotension, Malignant hyperthermia, or Pseudochol deficiency.   Social History: reports that she has quit smoking. She has never used smokeless tobacco. She reports that she does not drink alcohol or use illicit drugs.   Husband remains in [place], unable to get a visa to come to [place]. Patient is Hispanic in ethnicity, of the Catholic faith, employed a Water quality scientist.      Scientist, physiological   Maternal Diabetes: No--on Metformin prior to pregnancy due to "pre-diabetes", but no dx of diabetes/gestational diabetes during pregnancy Genetic Screening: Normal Quad screen Maternal Ultrasounds/Referrals: Abnormal:  Findings:   Other:Oligohydramnios since 36 weeks, now AFI 4.78 Fetal Ultrasounds or other Referrals:  None Maternal Substance Abuse:  No Significant Maternal Medications:  None Significant Maternal Lab Results: Lab values include: Group B Strep positive   ROS:  +FM   No Known Allergies   Filed Vitals:   05/11/14 1316  BP: 115/64  Pulse: 89  Temp: 98.5 F (36.9 C)  TempSrc: Oral  Resp: 18  Height:  (1.651 m)  Weight: 267 lb (121.11 kg)  SpO2: 99%    Chest clear Heart RRR without murmur Abd gravid, NT, FH 39 cm Pelvic: Cervix FT, 60%, vtx, -2, small amount bloody show No evidence leaking.  No evidence HSV lesions on external vulva, inside vagina, or on cervix. Ext: DTR 2+, no clonus, 1+ edema   FHR: Category 1 UCs:  Irregular, mild, irritability   Prenatal labs: ABO, Rh: --/--/A POS, A POS (08/18 1945) Antibody: NEG (08/18 1945) Rubella:  Immune RPR: NON REAC (08/18 1945)   HBsAg: NEGATIVE (03/02 1420)   HIV: NON REACTIVE (03/02 1420)   GBS: Positive on NOB urine Sickle cell/Hgb electrophoresis:  Hgb AC GC:  Negative  11/09/13 Chlamydia:  Negative 11/09/13 Genetic screenings:  Normal Quad screen Glucola:  WNL x 2 Other:  Hgb 13.1 at NOB, 12.6 at 28 weeks     Assessment/Plan: IUP at 37 6/7 weeks Oligohydramnios Induction recommended  by Dr. Claudean Severance. GBS positive Hx bariatric surgery Obesity Hx HSV, no recent or current lesions   Plan: Admit to [facility] per consult with Dr. Normand Sloop. Routine [facility] orders Plan cytotech for cervical ripening, then pitocin as appropriate. GBS prophylaxis with onset of labor or prior/prn. R&B of induction reviewed with patient, including failure of method, prolonged labor, need for C/S or further intervention, fetal intolerance, failure to progress. Patient seems to understand these risks and wishes to proceed. Continuous EFM, but may use telemetry monitoring if available.  Nyra Capes, [place] 05/11/14 1:50p

## 2014-05-12 ENCOUNTER — Inpatient Hospital Stay (HOSPITAL_COMMUNITY): Payer: Medicaid Other | Admitting: Anesthesiology

## 2014-05-12 ENCOUNTER — Encounter (HOSPITAL_COMMUNITY): Payer: Medicaid Other | Admitting: Anesthesiology

## 2014-05-12 DIAGNOSIS — B951 Streptococcus, group B, as the cause of diseases classified elsewhere: Secondary | ICD-10-CM | POA: Diagnosis present

## 2014-05-12 MED ORDER — LACTATED RINGERS IV SOLN
500.0000 mL | Freq: Once | INTRAVENOUS | Status: AC
  Administered 2014-05-12: 500 mL via INTRAVENOUS

## 2014-05-12 MED ORDER — PENICILLIN G POTASSIUM 5000000 UNITS IJ SOLR
2.5000 10*6.[IU] | INTRAVENOUS | Status: DC
  Administered 2014-05-12 – 2014-05-13 (×5): 2.5 10*6.[IU] via INTRAVENOUS
  Filled 2014-05-12 (×10): qty 2.5

## 2014-05-12 MED ORDER — DIPHENHYDRAMINE HCL 50 MG/ML IJ SOLN: 12.5000 mg | INTRAMUSCULAR | Status: DC | PRN

## 2014-05-12 MED ORDER — SIMETHICONE 80 MG PO CHEW
80.0000 mg | CHEWABLE_TABLET | Freq: Four times a day (QID) | ORAL | Status: DC | PRN
  Administered 2014-05-12 (×2): 80 mg via ORAL
  Filled 2014-05-12 (×2): qty 1

## 2014-05-12 MED ORDER — EPHEDRINE 5 MG/ML INJ
10.0000 mg | INTRAVENOUS | Status: DC | PRN
  Filled 2014-05-12: qty 2

## 2014-05-12 MED ORDER — FENTANYL 2.5 MCG/ML BUPIVACAINE 1/10 % EPIDURAL INFUSION (WH - ANES)
14.0000 mL/h | INTRAMUSCULAR | Status: DC | PRN
  Administered 2014-05-12 – 2014-05-13 (×2): 14 mL/h via EPIDURAL
  Filled 2014-05-12: qty 125

## 2014-05-12 MED ORDER — FENTANYL 2.5 MCG/ML BUPIVACAINE 1/10 % EPIDURAL INFUSION (WH - ANES)
INTRAMUSCULAR | Status: AC
  Filled 2014-05-12: qty 125

## 2014-05-12 MED ORDER — PENICILLIN G POTASSIUM 5000000 UNITS IJ SOLR
5.0000 10*6.[IU] | Freq: Once | INTRAVENOUS | Status: AC
  Administered 2014-05-12: 5 10*6.[IU] via INTRAVENOUS
  Filled 2014-05-12: qty 5

## 2014-05-12 MED ORDER — PHENYLEPHRINE 40 MCG/ML (10ML) SYRINGE FOR IV PUSH (FOR BLOOD PRESSURE SUPPORT)
80.0000 ug | PREFILLED_SYRINGE | INTRAVENOUS | Status: DC | PRN
  Filled 2014-05-12: qty 2

## 2014-05-12 MED ORDER — PHENYLEPHRINE 40 MCG/ML (10ML) SYRINGE FOR IV PUSH (FOR BLOOD PRESSURE SUPPORT)
PREFILLED_SYRINGE | INTRAVENOUS | Status: AC
  Filled 2014-05-12: qty 5

## 2014-05-12 MED ORDER — FENTANYL 2.5 MCG/ML BUPIVACAINE 1/10 % EPIDURAL INFUSION (WH - ANES): 14.0000 mL/h | INTRAMUSCULAR | Status: DC | PRN

## 2014-05-12 NOTE — Anesthesia Preprocedure Evaluation (Signed)
Anesthesia Evaluation  Patient identified by MRN, date of birth, ID band Patient awake    Reviewed: Allergy & Precautions, H&P , NPO status , Patient's Chart, lab work & pertinent test results  Airway Mallampati: II TM Distance: >3 FB Neck ROM: Full    Dental  (+) Teeth Intact   Pulmonary asthma , sleep apnea , former smoker,  breath sounds clear to auscultation        Cardiovascular negative cardio ROS  Rhythm:Regular Rate:Normal     Neuro/Psych PSYCHIATRIC DISORDERS Anxiety negative neurological ROS     GI/Hepatic Neg liver ROS, GERD-  ,  Endo/Other  diabetes, Type obesity  Renal/GU negative Renal ROS     Musculoskeletal negative musculoskeletal ROS (+)   Abdominal (+) + obese,  Abdomen: soft.    Peds  Hematology  (+) anemia ,   Anesthesia Other Findings   Reproductive/Obstetrics (+) Pregnancy                           Anesthesia Physical  Anesthesia Plan  ASA: III  Anesthesia Plan: Epidural   Post-op Pain Management:    Induction:   Airway Management Planned:   Additional Equipment:   Intra-op Plan:   Post-operative Plan:   Informed Consent: I have reviewed the patients History and Physical, chart, labs and discussed the procedure including the risks, benefits and alternatives for the proposed anesthesia with the patient or authorized representative who has indicated his/her understanding and acceptance.     Plan Discussed with:   Anesthesia Plan Comments:         Anesthesia Quick Evaluation

## 2014-05-12 NOTE — Progress Notes (Signed)
  Subjective: Foley bulb out at 9:13 pm. Desires epidural.  Objective: BP 101/64  Pulse 56  Temp(Src) 98.1 F (36.7 C) (Oral)  Resp 18  Ht  (1.651 m)  Wt 267 lb (121.11 kg)  BMI 44.43 kg/m2  SpO2 99%  LMP 08/19/2013      FHT: Baseline FHR 130 w/ mod variability, +accels, no decels UC:   irregular, every 2-6 minutes, palpate mild SVE:   Dilation: 4 Effacement (%): 50 Station: -2 Exam by:: [name] CNM Pitocin at 13 mius/min +Bloody show Has had 3 doses of ABX  Assessment:  Latent labor Increased Pain GBS positive Cat 1 FHRT  Plan: Epidural Continue GBS prophylaxis Amniotomy and IUPC when comfortable w/ epidural Consult prn Anticipate progress and SVD  [name] CNM 05/12/2014, 9:34 PM

## 2014-05-12 NOTE — Progress Notes (Signed)
  Subjective: Meet and greet. States in pain, but o/w doing ok. Has had a couple doses of Stadol w/ some relief. Sister at bedside.  Objective: BP 109/58  Pulse 60  Temp(Src) 98.2 F (36.8 C) (Oral)  Resp 18  Ht  (1.651 m)  Wt 267 lb (121.11 kg)  BMI 44.43 kg/m2  SpO2 99%  LMP 08/19/2013     FHT: Baseline FHR 122 w/ mod variability, +accels, no late decels, sharp variable to 110, quickly resolved w/o intervention UC:   irregular, every 1-2 minutes, palpate mild SVE: Deferred    Pitocin at 13 mius/min  Assessment:  33 yo G1P0 @ 38.0 wks admitted yesterday for oligo - AFI 4.58 cm; IOL was recommended by MFM H/O HSV GBS pos H/O Bariatric Surgery AMA Asthmatic Cat 2 tracing initially, now Cat 1 Foley bulb still intact  Plan: Continue w/ current management plan Consult as indicated Expect progress and SVD  [name] CNM 05/12/2014, 7:19 PM

## 2014-05-12 NOTE — Progress Notes (Signed)
  Subjective: More pain with contractions.  Has received 2 doses Stadol, with benefit.  Objective: BP 112/66  Pulse 56  Temp(Src) 98.4 F (36.9 C) (Oral)  Resp 18  Ht  (1.651 m)  Wt 267 lb (121.11 kg)  BMI 44.43 kg/m2  SpO2 99%  LMP 08/19/2013      FHT: Category 1 UC:   irregular, every 2-5 minutes SVE:   Deferred at present--foley still in place  Pitocin at 5 mu/min  Assessment:  Latent/Early labor--induction due to oligohydramnios  Plan: Continue to observe.  Nigel Bridgeman CNM 05/12/2014, 4:14 PM

## 2014-05-12 NOTE — Progress Notes (Signed)
Ward Chatters MRN: [id number]  Subjective: -Patient resting in bed.  Reports some cramping, but intermittent and tolerable.   Objective: BP 108/44  Pulse 62  Temp(Src) 97.8 F (36.6 C) (Oral)  Resp 20  Ht  (1.651 m)  Wt 267 lb (121.11 kg)  BMI 44.43 kg/m2  SpO2 99%  LMP 08/19/2013     FHT:  125 bpm, Mod Var, -Decels, +10x10 Accels UC:   Irregular SVE:   Dilation: Fingertip Effacement (%): 60 Station: -2 Exam by:: Sabas Sous, CNM Membranes:Intact Pitocin:None 4th Cytotec Placed  Assessment:  IUP at 38wks Cat I FT  Oligo Cervical Ripening  Plan: -Cytotec placed -Will reassess in 4 hours for possible foley bulb placement -Continue other mgmt as ordered  Karina [name],CNM, MSN 05/12/2014, 3:14 AM

## 2014-05-12 NOTE — Progress Notes (Signed)
  Subjective: Awaiting light breakfast.  Slept some during night.  Feeling some cramping, but not uncomfortable.  Wants to move around during labor, as allowed.  Objective: BP 117/67  Pulse 79  Temp(Src) 98.4 F (36.9 C) (Oral)  Resp 18  Ht  (1.651 m)  Wt 267 lb (121.11 kg)  BMI 44.43 kg/m2  SpO2 99%  LMP 08/19/2013      FHT: Category 1 UC:   Occasional, mild SVE:  Deferred until after patient eats Last dose Cytotec placed at 3:15a  Assessment:  IUP at 38 weeks Induction for oligohydramnios GBS positive Category 1 FHR Unfavorable cervix--s/p Cytotech x 4 doses  Plan: Reviewed induction process with patient Will evaluate cervix after light meal--consider Foley bulb + pitocin, if able to insert. If unable to insert, will plan repeat Cytotec. Patient agreeable with plan. On telemetry monitoring.  Nigel Bridgeman CNM 05/12/2014, 9:20 AM

## 2014-05-12 NOTE — Progress Notes (Signed)
  Subjective: Ready for exam--ate light breakfast.  Objective: BP 117/67  Pulse 79  Temp(Src) 98.4 F (36.9 C) (Oral)  Resp 18  Ht  (1.651 m)  Wt 267 lb (121.11 kg)  BMI 44.43 kg/m2  SpO2 99%  LMP 08/19/2013      FHT: Category 1 UC:   Occasional SVE:   Dilation: 1 Effacement (%): 80 Station: -2 Exam by:: Manfred Arch CNM Foley bulb inserted, inflated with 30 cc.   Assessment:  Induction for oligohydramnios Improved cervical status GBS positive  Plan: Pitocin per low dose protocol Foley until extruded with cervical dilation Start GBS prophylaxis  Karina [name] CNM 05/12/2014, 11:02 AM

## 2014-05-12 NOTE — Anesthesia Procedure Notes (Signed)
Epidural Patient location during procedure: OB  Staffing Anesthesiologist: Antonia Jicha R Performed by: anesthesiologist   Preanesthetic Checklist Completed: patient identified, pre-op evaluation, timeout performed, IV checked, risks and benefits discussed and monitors and equipment checked  Epidural Patient position: sitting Prep: site prepped and draped and DuraPrep Patient monitoring: heart rate Approach: midline Location: L3-L4 Injection technique: LOR air and LOR saline  Needle:  Needle type: Tuohy  Needle gauge: 17 G Needle length: 9 cm Needle insertion depth: 8 cm Catheter type: closed end flexible Catheter size: 19 Gauge Catheter at skin depth: 14 cm Test dose: negative  Assessment Sensory level: T8 Events: blood not aspirated, injection not painful, no injection resistance, negative IV test and no paresthesia  Additional Notes Reason for block:procedure for pain   

## 2014-05-12 NOTE — Progress Notes (Signed)
  Subjective: Comfortable w/ epidural.   Objective: BP 97/49  Pulse 39  Temp(Src) 98 F (36.7 C) (Oral)  Resp 18  Ht  (1.651 m)  Wt 267 lb (121.11 kg)  BMI 44.43 kg/m2  SpO2 100%  LMP 08/19/2013      FHT: Baseline 140 w/ mod variability, +accels, decel to 90s x 2 1/2 min during amniotomy and placement of IUPC. UC:   irregular, every 1-3 minutes SVE:   Dilation: 4 Effacement (%): 50 Station: -2 Exam by:: Lizmarie Witters CNM Pitocin off  Assessment:  NICHD, Cat 2  Plan: Pitocin off for now; restart in 30 min Observe closely Dr. Estanislado Pandy updated  Mayford Knife, [facility] CNM 05/12/2014, 11:52 PM

## 2014-05-12 NOTE — Progress Notes (Signed)
  Subjective: Comfortable--resting in bed.  Objective: BP 108/44  Pulse 62  Temp(Src) 97.8 F (36.6 C) (Oral)  Resp 20  Ht  (1.651 m)  Wt 267 lb (121.11 kg)  BMI 44.43 kg/m2  SpO2 99%  LMP 08/19/2013      FHT: Category 1 UC:   Very occasional, mild SVE:   FT, 60%, vtx, -2, cervix posterior, adequate pelvis 2nd dose Cytotech placed in posterior fornix  Assessment:  Induction for oligohydramnios GBS positive Category 1 FHR  Plan: Continue Cytotech for cervical ripening.  Nigel Bridgeman CNM 05/11/14 7p

## 2014-05-12 NOTE — Progress Notes (Signed)
  Subjective: Breathing with some UCs.  Has received 2 doses Stadol--last dose 1418.  Objective: BP 108/61  Pulse 53  Temp(Src) 98.4 F (36.9 C) (Oral)  Resp 16  Ht  (1.651 m)  Wt 267 lb (121.11 kg)  BMI 44.43 kg/m2  SpO2 99%  LMP 08/19/2013      FHT: Category 1 UC:   regular, every 4 minutes SVE:   Deferred--foley still in place Pitocin at 10 mu/min  Assessment:  Latent/early labor GBS positive Oligohydramnios  Plan: Continue current plan of foley and pitocin induction.  Nigel Bridgeman CNM 05/12/2014, 5:34 PM

## 2014-05-13 ENCOUNTER — Encounter (HOSPITAL_COMMUNITY): Payer: Self-pay | Admitting: *Deleted

## 2014-05-13 MED ORDER — DIBUCAINE 1 % RE OINT: 1.0000 "application " | TOPICAL_OINTMENT | RECTAL | Status: DC | PRN

## 2014-05-13 MED ORDER — ONDANSETRON HCL 4 MG PO TABS
4.0000 mg | ORAL_TABLET | ORAL | Status: DC | PRN
Start: 2014-05-13 — End: 2014-05-15

## 2014-05-13 MED ORDER — TETANUS-DIPHTH-ACELL PERTUSSIS 5-2.5-18.5 LF-MCG/0.5 IM SUSP
0.5000 mL | Freq: Once | INTRAMUSCULAR | Status: AC
Start: 2014-05-14 — End: 2014-05-14
  Administered 2014-05-14: 0.5 mL via INTRAMUSCULAR
  Filled 2014-05-13: qty 0.5

## 2014-05-13 MED ORDER — DIPHENHYDRAMINE HCL 25 MG PO CAPS: 25.0000 mg | ORAL_CAPSULE | Freq: Four times a day (QID) | ORAL | Status: DC | PRN

## 2014-05-13 MED ORDER — OXYCODONE-ACETAMINOPHEN 5-325 MG PO TABS
1.0000 | ORAL_TABLET | ORAL | Status: DC | PRN
  Administered 2014-05-14 – 2014-05-15 (×5): 1 via ORAL
  Filled 2014-05-13 (×5): qty 1

## 2014-05-13 MED ORDER — TERBUTALINE SULFATE 1 MG/ML IJ SOLN
0.2500 mg | Freq: Once | INTRAMUSCULAR | Status: AC
  Administered 2014-05-13: 0.25 mg via SUBCUTANEOUS

## 2014-05-13 MED ORDER — ZOLPIDEM TARTRATE 5 MG PO TABS: 5.0000 mg | ORAL_TABLET | Freq: Every evening | ORAL | Status: DC | PRN

## 2014-05-13 MED ORDER — IBUPROFEN 600 MG PO TABS
600.0000 mg | ORAL_TABLET | Freq: Four times a day (QID) | ORAL | Status: DC
  Administered 2014-05-13 – 2014-05-15 (×8): 600 mg via ORAL
  Filled 2014-05-13 (×8): qty 1

## 2014-05-13 MED ORDER — LACTATED RINGERS IV SOLN
INTRAVENOUS | Status: DC
Start: 2014-05-13 — End: 2014-05-13
  Administered 2014-05-13: 03:00:00 via INTRAUTERINE

## 2014-05-13 MED ORDER — SIMETHICONE 80 MG PO CHEW: 80.0000 mg | CHEWABLE_TABLET | ORAL | Status: DC | PRN

## 2014-05-13 MED ORDER — TERBUTALINE SULFATE 1 MG/ML IJ SOLN
INTRAMUSCULAR | Status: AC
  Filled 2014-05-13: qty 1

## 2014-05-13 MED ORDER — SENNOSIDES-DOCUSATE SODIUM 8.6-50 MG PO TABS
2.0000 | ORAL_TABLET | ORAL | Status: DC
  Administered 2014-05-14 (×2): 2 via ORAL
  Filled 2014-05-13 (×2): qty 2

## 2014-05-13 MED ORDER — ONDANSETRON HCL 4 MG/2ML IJ SOLN: 4.0000 mg | INTRAMUSCULAR | Status: DC | PRN

## 2014-05-13 MED ORDER — OXYCODONE-ACETAMINOPHEN 5-325 MG PO TABS
2.0000 | ORAL_TABLET | ORAL | Status: DC | PRN
  Administered 2014-05-15: 2 via ORAL
  Filled 2014-05-13: qty 2

## 2014-05-13 MED ORDER — PRENATAL MULTIVITAMIN CH
1.0000 | ORAL_TABLET | Freq: Every day | ORAL | Status: DC
  Administered 2014-05-13 – 2014-05-15 (×3): 1 via ORAL
  Filled 2014-05-13 (×3): qty 1

## 2014-05-13 MED ORDER — WITCH HAZEL-GLYCERIN EX PADS: 1.0000 "application " | MEDICATED_PAD | CUTANEOUS | Status: DC | PRN

## 2014-05-13 MED ORDER — BENZOCAINE-MENTHOL 20-0.5 % EX AERO: 1.0000 "application " | INHALATION_SPRAY | CUTANEOUS | Status: DC | PRN

## 2014-05-13 MED ORDER — LANOLIN HYDROUS EX OINT: TOPICAL_OINTMENT | CUTANEOUS | Status: DC | PRN

## 2014-05-13 NOTE — Lactation Note (Signed)
This note was copied from the chart of Karina [name]. Lactation Consultation Note  Patient Name: Karina [name] [id number] Date: 05/13/2014 Reason for consult: Initial assessment of this primipara and her newborn at 9 hours postpartum.  Baby asleep on mom's lap and she and her family are eating supper.  Mom says baby has latched a few times since birth.  No LATCH score yet documented but mom reports that her nurse has shown her hand expression and baby has latched for 10 and 25 minutes at two feedings.  LC encouraged frequent STS and cue feedings.  Mom encouraged to feed baby 8-12 times/24 hours and with feeding cues. LC encouraged review of Baby and Me pp 9, 14 and 20-25 for STS and BF information. LC provided [employer] brochure and reviewed [facility] services and list of community and web site resources.    Maternal Data Formula Feeding for Exclusion: No Has patient been taught Hand Expression?: Yes (mom says her nurse has shown her hand expression) Does the patient have breastfeeding experience prior to this delivery?: No  Feeding Feeding Type: Breast Fed Length of feed: 0 min (no latch sleepy)  LATCH Score/Interventions         Baby has breastfed twice but no LATCH score documented yet             Lactation Tools Discussed/Used   STS, cue feedings, hand expression  Consult Status Consult Status: Follow-up Date: 05/14/14 Follow-up type: In-patient    Warrick Parisian [facility] 05/13/2014, 8:40 PM

## 2014-05-13 NOTE — Progress Notes (Signed)
  Subjective: Sleeping, yet easily aroused. Sister at bedside.  Objective: BP 109/45  Pulse 75  Temp(Src) 97.6 F (36.4 C) (Oral)  Resp 16  Ht  (1.651 m)  Wt 267 lb (121.11 kg)  BMI 44.43 kg/m2  SpO2 100%  LMP 08/19/2013   Total I/O In: -  Out: 750 [Urine:750]  FHT: BL 135-140 w/ min-mod variability, +accels, earlys UC:   irregular, every 2-6 minutes SVE:   Dilation: 4 Effacement (%): 80 Station: -2 Exam by:: Willilams, CNM Pitocin at 2 mius/min Has had 5 doses of abx MVUs 145  Assessment:  Latent labor More effaced w/ this exam GBS positive  Plan: Pitocin restarted 1hr s/p terb (around 4:30 am) Continue w/ current management plan   [name] CNM 05/13/2014, 5:38 AM

## 2014-05-13 NOTE — Progress Notes (Signed)
  Subjective: Sleeping  Objective: BP 96/55  Pulse 52  Temp(Src) 98 F (36.7 C) (Oral)  Resp 18  Ht  (1.651 m)  Wt 267 lb (121.11 kg)  BMI 44.43 kg/m2  SpO2 100%  LMP 08/19/2013     FHT: BL 130 w/ mod variability, +accels, mild variables UC:   irregular, every 1-4 minutes SVE: Deferred  Pitocin at 7 mius/min  Assessment:  Adequate MVUs, but coupling noted GBS positive NICHD, Cat 2  Plan: Begin amnioinfusion Continue plan Consult as indicated  [name] CNM 05/13/2014, 3:13 AM

## 2014-05-13 NOTE — Progress Notes (Signed)
Karina [name] is a 33 y.o. G1P0 at [redacted]w[redacted]d by admitted for labor induction for oligohydramnios.     Subjective: Patient comfortable with epidural.   Objective: BP 97/46  Pulse 60  Temp(Src) 98.2 F (36.8 C) (Oral)  Resp 16  Ht  (1.651 m)  Wt 267 lb (121.11 kg)  BMI 44.43 kg/m2  SpO2 100%  LMP 08/19/2013 I/O last 3 completed shifts: In: -  Out: 750 [Urine:750] Total I/O In: -  Out: 1250 [Urine:1250]  FHT:  FHR: 150 bpm, variability: moderate,  accelerations:  Present,  decelerations:  Absent UC:   regular, every 3-4 minutes SVE:   8-9/90%/-1.  Exam at about 8.45 am.     Labs: Lab Results  Component Value Date   WBC 10.4 05/11/2014   HGB 11.8* 05/11/2014   HCT 32.6* 05/11/2014   MCV 75.1* 05/11/2014   PLT 204 05/11/2014    Assessment / Plan: Induction for oligohydramnios, with recurrent late decelerations while on pitocin but no decelerations without pitocin, currently category 1 tracing  Labor: Restart pitocin, if fetal heart tracing becomes abnormal again plan for cesarean delivery.  Discussed with patient these findings and patient agrees.    [facility] 05/13/2014, 9:21 AM

## 2014-05-13 NOTE — Progress Notes (Signed)
  Subjective: Sleeping, yet easily aroused  Objective: BP 96/55  Pulse 52  Temp(Src) 98 F (36.7 C) (Oral)  Resp 16  Ht  (1.651 m)  Wt 267 lb (121.11 kg)  BMI 44.43 kg/m2  SpO2 100%  LMP 08/19/2013     FHT: BL 125 w/ mod variability, prolonged decel to 80-90s UC:   regular, every 1 minute SVE: Deferred  Pitocin off  Assessment:  Tachysystole  Plan: Terb CTO closely  [name] CNM 05/13/2014, 3:33 AM

## 2014-05-14 LAB — CBC
HEMATOCRIT: 28.4 % — AB (ref 36.0–46.0)
HEMOGLOBIN: 10.3 g/dL — AB (ref 12.0–15.0)
MCH: 27.6 pg (ref 26.0–34.0)
MCHC: 36.3 g/dL — AB (ref 30.0–36.0)
MCV: 76.1 fL — ABNORMAL LOW (ref 78.0–100.0)
Platelets: 178 10*3/uL (ref 150–400)
RBC: 3.73 MIL/uL — ABNORMAL LOW (ref 3.87–5.11)
RDW: 14 % (ref 11.5–15.5)
WBC: 12.4 10*3/uL — AB (ref 4.0–10.5)

## 2014-05-14 MED ORDER — FERROUS SULFATE 325 (65 FE) MG PO TABS
325.0000 mg | ORAL_TABLET | Freq: Two times a day (BID) | ORAL | Status: DC
  Administered 2014-05-14 – 2014-05-15 (×2): 325 mg via ORAL
  Filled 2014-05-14 (×2): qty 1

## 2014-05-14 NOTE — Progress Notes (Signed)
Karina [name]    Subjective: Post Partum Day 1 Vaginal delivery, superficial bilateral labial degree laceration, not repaired at the pt request Patient up ad lib, denies syncope or dizziness. Reports consuming regular diet without issues and denies N/V No issues with urination and reports bleeding is appropriate  Feeding:  breastfeeding Contraceptive plan:   none  Objective: Temp:  [97.6 F (36.4 C)-99.5 F (37.5 C)] 97.6 F (36.4 C) (09/04 0622) Pulse Rate:  [65-137] 73 (09/04 0622) Resp:  [16-20] 18 (09/04 0622) BP: (92-192)/(53-161) 121/72 mmHg (09/04 0622) SpO2:  [98 %-100 %] 100 % (09/04 0230)  Physical Exam:  General: alert and cooperative Ext: WNL, no edema. No evidence of DVT seen on physical exam. Breast: Soft filling Lungs: CTAB Heart RRR without murmur  Abdomen:  Soft, fundus firm, lochia scant, + bowel sounds, non distended, non tender Lochia: appropriate Uterine Fundus: firm Laceration: healing well    Recent Labs  05/11/14 1345 05/14/14 0625  HGB 11.8* 10.3*  HCT 32.6* 28.4*    Assessment S/P Vaginal Delivery-Day 1 Stable  Normal Involution Breastfeeding  Plan: Continue current care Dr. Su Hilt updated on patient status  Plan for discharge tomorrow, Breastfeeding and Lactation consult Iron supplement bid   Karina [name], CNM, MSN 05/14/2014, 9:53 AM

## 2014-05-14 NOTE — Anesthesia Postprocedure Evaluation (Signed)
Anesthesia Post Note  Patient: Karina [name]  Procedure(s) Performed: * No procedures listed *  Anesthesia type: Epidural  Patient location: Mother/Baby  Post pain: Pain level controlled  Post assessment: Post-op Vital signs reviewed  Last Vitals:  Filed Vitals:   05/14/14 0622  BP: 121/72  Pulse: 73  Temp: 36.4 C  Resp: 18    Post vital signs: Reviewed  Level of consciousness:alert  Complications: No apparent anesthesia complications

## 2014-05-14 NOTE — Lactation Note (Signed)
This note was copied from the chart of Karina [name]. Lactation Consultation Note Mom states that breastfeeding is improving, but that baby is still sleepy and sometimes feeds for only a few minutes. Mom reports that baby fed for 12 minutes less than 2 hours ago. Mom denies breast/ nipple pain with breastfeeding.  At this time, mom is holding baby, and baby sound asleep. Offered to assist with a feeding, and mom states she will call for next feeding if needed.  Patient Name: Karina [name] [id number] Date: 05/14/2014     Maternal Data    Feeding Length of feed: 9 min  LATCH Score/Interventions Latch: Grasps breast easily, tongue down, lips flanged, rhythmical sucking.  Audible Swallowing: A few with stimulation Intervention(s): Hand expression  Type of Nipple: Flat Intervention(s): Hand pump;Shells  Comfort (Breast/Nipple): Soft / non-tender     Hold (Positioning): No assistance needed to correctly position infant at breast.  LATCH Score: 8  Lactation Tools Discussed/Used     Consult Status      Lenard Forth 05/14/2014, 1:50 PM

## 2014-05-15 MED ORDER — IBUPROFEN 600 MG PO TABS: 600.0000 mg | ORAL_TABLET | Freq: Four times a day (QID) | ORAL | Status: DC | PRN

## 2014-05-15 MED ORDER — OXYCODONE-ACETAMINOPHEN 5-325 MG PO TABS: 1.0000 | ORAL_TABLET | ORAL | Status: DC | PRN

## 2014-05-15 MED ORDER — FERROUS SULFATE 325 (65 FE) MG PO TABS: 325.0000 mg | ORAL_TABLET | Freq: Every day | ORAL | Status: DC

## 2014-05-15 NOTE — Lactation Note (Signed)
This note was copied from the chart of Karina [name]. Lactation Consultation Note      Follow up consult with this mom and baby, now 9 hours old. I observed mom latching baby - I had her sit back to support her back, and use pillows to support he and baby. The baby latches easily for mom, with deep latch and lots of breast movement and swallowsnge should fit. Mom knows to call lactation for questions/concerns, and support group advised.  Patient Name: Karina [name] [id number] Date: 05/15/2014 Reason for consult: Follow-up assessment   Maternal Data    Feeding Feeding Type: Breast Fed  LATCH Score/Interventions Latch: Grasps breast easily, tongue down, lips flanged, rhythmical sucking.  Audible Swallowing: Spontaneous and intermittent Intervention(s): Hand expression  Type of Nipple: Flat Intervention(s): No intervention needed  Comfort (Breast/Nipple): Soft / non-tender     Hold (Positioning): Assistance needed to correctly position infant at breast and maintain latch. Intervention(s): Breastfeeding basics reviewed;Support Pillows;Position options;Skin to skin  LATCH Score: 8  Lactation Tools Discussed/Used Tools: Flanges Flange Size: 27   Consult Status Consult Status: Complete Follow-up type: Call as needed    Alfred Levins 05/15/2014, 11:31 AM

## 2014-05-15 NOTE — Discharge Instructions (Signed)

## 2014-05-15 NOTE — Discharge Summary (Signed)
Vaginal Delivery Discharge Summary  Ward Chatters  DOB:    11-29-1980 MRN:    [id number] CSN:    [id number]  Date of admission:                  05/11/14  Date of discharge:                   05/15/14  Procedures this admission:   SVB, induction of labor, epidural analgesia  Date of Delivery: 05/13/14  Newborn Data:  Live born female  Birth Weight: 6 lb 8.1 oz (2951 g) APGAR: 9, 9  Home with mother. Name: Karina [name]   History of Present Illness:  Ms. Ward Chatters is a 33 y.o. female, G1P1000, who presents at [redacted]w[redacted]d weeks gestation. The patient has been followed at the [facility] and [facility]cology division of [facility]. She was admitted induction of labor due to oligohydramnios. Her pregnancy has been complicated by:  Patient Active Problem List   Diagnosis Date Noted  . Vaginal delivery 05/13/2014  . Positive GBS test 05/12/2014  . 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 Thiamin deficiency 11/13/2013  . 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 Asthma 11/09/2013  . Obesity complicating pregnancy 11/09/2013  . Anxiety 02/20/2013  . Personal history of infectious and parasitic disease 02/20/2013  . H/O gastrointestinal disease 02/20/2013  . H/O laparoscopic adjustable gastric banding 02/20/2013  . Anemia 02/01/2012  . Biliary dyskinesia 12/06/2011  . Bariatric surgery status 06/11/2011  . Palpitations 04/03/2011     Hospital Course:  Admitted 05/11/14 for induction due to oligohydramnios noted on [place].  Amnisure was negative.  Cytotech was initiated, pitocin begun the next am.  Positive GBS. Progressed slowly, with some FHR decelerations noted during labor. Utilized epidural for pain management.  Delivery was performed by [facility] Standard, CNM, without complication. Patient and baby tolerated the procedure without difficulty, with superficial labial laceration noted, no repair required. Infant status was stable and remained in room with  mother.  Mother and infant then had an uncomplicated postpartum course, with breastfeeding going well. Mom's physical exam was WNL, and she was discharged home in stable condition. Contraception plan was abstinence (husband is in [place]).  She received adequate benefit from po pain medications.   Feeding:  breast  Contraception:  abstinence  Discharge hemoglobin:  Hemoglobin  Date Value Ref Range Status  05/14/2014 10.3* 12.0 - 15.0 g/dL Final     HCT  Date Value Ref Range Status  05/14/2014 28.4* 36.0 - 46.0 % Final    Discharge Physical Exam:   General: alert Lochia: appropriate Uterine Fundus: firm Incision: healing well DVT Evaluation: No evidence of DVT seen on physical exam. Negative Homan's sign.  Intrapartum Procedures: spontaneous vaginal delivery Postpartum Procedures: none Complications-Operative and Postpartum: superficial bilateral labial lacerations--no repair required.  Discharge Diagnoses: Term Pregnancy-delivered, GBS positive  Discharge Information:  Activity:           pelvic rest Diet:                routine Medications: Ibuprofen and Percocet Condition:      stable Instructions:     Discharge to: home  Follow-up Information   Follow up with [facility] In 6 weeks. (Call for any questions or concerns)    Specialty:  Obstetrics and Gynecology   Contact information:   [address] [place] [address] [phone]  Nigel Bridgeman CNM 05/15/2014 9:06 AM

## 2014-07-12 ENCOUNTER — Encounter (HOSPITAL_COMMUNITY): Payer: Self-pay | Admitting: *Deleted

## 2014-09-06 ENCOUNTER — Encounter: Payer: Self-pay | Admitting: *Deleted

## 2014-09-07 ENCOUNTER — Encounter: Payer: Self-pay | Admitting: Obstetrics & Gynecology

## 2015-04-08 ENCOUNTER — Emergency Department (INDEPENDENT_AMBULATORY_CARE_PROVIDER_SITE_OTHER): Payer: Medicaid Other

## 2015-04-08 ENCOUNTER — Encounter (HOSPITAL_COMMUNITY): Payer: Self-pay | Admitting: Emergency Medicine

## 2015-04-08 ENCOUNTER — Emergency Department (HOSPITAL_COMMUNITY)
Admission: EM | Admit: 2015-04-08 | Discharge: 2015-04-08 | Disposition: A | Discharge status: Home / Self Care | Payer: Medicaid Other | Source: Home / Self Care | Attending: Family Medicine | Admitting: Family Medicine

## 2015-04-08 DIAGNOSIS — M25571 Pain in right ankle and joints of right foot: Secondary | ICD-10-CM

## 2015-04-08 DIAGNOSIS — M79671 Pain in right foot: Secondary | ICD-10-CM

## 2015-04-08 MED ORDER — DICLOFENAC SODIUM 75 MG PO TBEC: 75.0000 mg | DELAYED_RELEASE_TABLET | Freq: Two times a day (BID) | ORAL | Status: DC

## 2015-04-08 NOTE — ED Provider Notes (Signed)
CSN: [id number]     Arrival date & time 04/08/15  1819 History   First MD Initiated Contact with Patient 04/08/15 1839     Chief Complaint  Patient presents with  . Foot Pain   (Consider location/radiation/quality/duration/timing/severity/associated sxs/prior Treatment) HPI  R foot pain. 2 days of pain. Pt was ambulating and felt a pop and immediate pain. Getting worse. Bearing weight makes it worse. Some swelling. Sensation and movement intact. No history of trauma to that ankle. Ankle brace without improvement. Pain does not radiate. Ibuprofen with some improvement.    Past Medical History  Diagnosis Date  . Chronic bronchitis   . History of chicken pox   . Asthma     albuterol as needed  . Sleep apnea     cpap  . Diabetes mellitus     takes metformin daily  . Anemia     takes iron daily  . GERD (gastroesophageal reflux disease)     nexium   . Edema extremities     hx of edema in lower ext. takes lasix PRN last dose few weeks ago  . Gallbladder disease   . Pregnant    Past Surgical History  Procedure Laterality Date  . Carpal tunnel release  2008, 2010  . Cholecystectomy  01/11/2012    Procedure: LAPAROSCOPIC CHOLECYSTECTOMY WITH INTRAOPERATIVE CHOLANGIOGRAM;  Surgeon: Cherylynn Ridges, MD;  Location: [facility] OR;  Service: General;  Laterality: N/A;  . Laparoscopic gastric banding    . No past surgeries     Family History  Problem Relation Age of Onset  . Diabetes Sister   . Heart disease Other   . Hyperlipidemia Other   . Hypertension Other   . Stroke Other   . Kidney disease Other   . Thyroid disease Other   . Ovarian cancer Maternal Aunt   . Prostate cancer Maternal Uncle   . Cervical cancer Maternal Aunt   . Liver cancer Paternal Grandfather   . Liver cancer Paternal Uncle   . Anesthesia problems Mother   . Hypotension Neg Hx   . Malignant hyperthermia Neg Hx   . Pseudochol deficiency Neg Hx    History  Substance Use Topics  . Smoking status: Former Games developer   . Smokeless tobacco: Never Used  . Alcohol Use: No     Comment: socially   OB History    Gravida Para Term Preterm AB TAB SAB Ectopic Multiple Living   1 1 1             Review of Systems Per HPI with all other pertinent systems negative.   Allergies  Review of patient's allergies indicates no known allergies.  Home Medications   Prior to Admission medications   Medication Sig Start Date End Date Taking? Authorizing Provider  albuterol (PROVENTIL HFA;VENTOLIN HFA) 108 (90 BASE) MCG/ACT inhaler Inhale 1 puff into the lungs every 6 (six) hours as needed for wheezing or shortness of breath.    Yes Historical Provider, MD  acetaminophen (TYLENOL) 500 MG tablet Take 500 mg by mouth every 6 (six) hours as needed for mild pain.    Historical Provider, MD  calcium carbonate (TUMS - DOSED IN MG ELEMENTAL CALCIUM) 500 MG chewable tablet Chew 1 tablet by mouth as needed for indigestion.     Historical Provider, MD  diclofenac (VOLTAREN) 75 MG EC tablet Take 1 tablet (75 mg total) by mouth 2 (two) times daily. 04/08/15   Ozella Rocks, MD  ferrous sulfate 325 (65 FE) MG  tablet Take 1 tablet (325 mg total) by mouth daily with breakfast. 05/15/14   Nigel Bridgeman, CNM  ibuprofen (ADVIL,MOTRIN) 600 MG tablet Take 1 tablet (600 mg total) by mouth every 6 (six) hours as needed. 05/15/14   Nigel Bridgeman, CNM  oxyCODONE-acetaminophen (PERCOCET/ROXICET) 5-325 MG per tablet Take 1 tablet by mouth every 4 (four) hours as needed (for pain scale less than 7). 05/15/14   Nigel Bridgeman, CNM  Prenatal Vit-Fe Fumarate-FA (MULTIVITAMIN-PRENATAL) 27-0.8 MG TABS tablet Take 1 tablet by mouth daily at 12 noon.    Historical Provider, MD  ranitidine (ZANTAC) 150 MG capsule Take 1 capsule (150 mg total) by mouth daily. 03/28/14   Reuben Likes, MD  thiamine (VITAMIN B-1) 100 MG tablet Take 100 mg by mouth every other day.     Historical Provider, MD   BP 117/80 mmHg  Pulse 73  Temp(Src) 98.2 F (36.8 C) (Oral)  Resp 18   SpO2 100%  LMP 03/13/2015  Breastfeeding? No Physical Exam Physical Exam  Constitutional: oriented to person, place, and time. appears well-developed and well-nourished. No distress.  HENT:  Head: Normocephalic and atraumatic.  Eyes: EOMI. PERRL.  Neck: Normal range of motion.  Cardiovascular: RRR, no m/r/g, 2+ distal pulses,  Pulmonary/Chest: Effort normal and breath sounds normal. No respiratory distress.  Abdominal: Soft. Bowel sounds are normal. NonTTP, no distension.  Musculoskeletal: Right ankle with full range of motion but extremely lax and popping present when manipulated in the anterior-posterior directions. Point tenderness along the inferior aspect of the lateral malleolus as well as along the top of the third and fourth metatarsals. No bony abnormalities appreciated..  Neurological: alert and oriented to person, place, and time.  Skin: Skin is warm. No rash noted. non diaphoretic.  Psychiatric: normal mood and affect. behavior is normal. Judgment and thought content normal.   ED Course  Procedures (including critical care time) Labs Review Labs Reviewed - No data to display  Imaging Review Dg Ankle Complete Right  04/08/2015   CLINICAL DATA:  Ankle pain. Felt a pop getting out of bed. Lateral ankle pain.  EXAM: RIGHT ANKLE - COMPLETE 3+ VIEW  COMPARISON:  None.  FINDINGS: There is no evidence of fracture, dislocation, or joint effusion. There is no evidence of arthropathy or other focal bone abnormality. Soft tissues are unremarkable.  IMPRESSION: Negative.   Electronically Signed   By: Charlett Nose M.D.   On: 04/08/2015 19:34   Dg Foot Complete Right  04/08/2015   CLINICAL DATA:  Sharp pain and RIGHT foot. Pain started this morning  EXAM: RIGHT FOOT COMPLETE - 3+ VIEW  COMPARISON:  None.  FINDINGS: No fracture or dislocation of mid foot or forefoot. The phalanges are normal. The calcaneus is normal. No soft tissue abnormality.  IMPRESSION: No fracture or foreign body.    Electronically Signed   By: Genevive Bi M.D.   On: 04/08/2015 19:01     MDM   1. Ankle pain, right   2. Foot pain, right    Etiology not immediately clear but suspect small structural decompensation of the foot as the ankle is very loose on exam. Patient states she is unable to a relate and assets will use patient Cam Walker boot but strongly encouraged patient to wear this as little as possible and to perform range of motion exercises multiple times per day. Also recommending the patient follow-up with orthopedic surgery for further management. Start Voltaren.   Ozella Rocks, MD 04/08/15 [phone]

## 2015-04-08 NOTE — ED Notes (Signed)
Pt c/o right foot/ankle pain onset 3 days Sx include swelling and unable to bear wt Denies inj/trauma but recalls one night she woke up and was walking when she felt a sharp pain Brought back in wheelchair  Alert, no signs of acute distress.

## 2015-04-08 NOTE — Discharge Instructions (Signed)
The cause of your ankle pain is not immediately clear. This may be due to breakdown of some of the smaller structures of the foot, including some of the ligaments. No evidence of fracture on the x-ray. Please use the boot as needed for pain relief for remember to perform range of motion exercises multiple times per day. Use the Voltaren for additional pain relief. Please consider calling an orthopedic surgeon's office on Monday to schedule follow-up appointment if your pain persists.

## 2015-09-02 ENCOUNTER — Emergency Department (INDEPENDENT_AMBULATORY_CARE_PROVIDER_SITE_OTHER): Payer: Medicaid Other

## 2015-09-02 ENCOUNTER — Emergency Department (HOSPITAL_COMMUNITY)
Admission: EM | Admit: 2015-09-02 | Discharge: 2015-09-02 | Disposition: A | Discharge status: Home / Self Care | Payer: Medicaid Other | Source: Home / Self Care

## 2015-09-02 ENCOUNTER — Encounter (HOSPITAL_COMMUNITY): Payer: Self-pay | Admitting: Emergency Medicine

## 2015-09-02 DIAGNOSIS — S8992XA Unspecified injury of left lower leg, initial encounter: Secondary | ICD-10-CM

## 2015-09-02 NOTE — ED Notes (Signed)
C/o bilateral knee pain onset yest Reports she fell forward onto hardwood flooring Also reports a knot on left knee cap Steady gait... No acute distress.

## 2015-09-02 NOTE — Discharge Instructions (Signed)
Heat Therapy °Heat therapy can help ease sore, stiff, injured, and tight muscles and joints. Heat relaxes your muscles, which may help ease your pain. Heat therapy should only be used on old, pre-existing, or long-lasting (chronic) injuries. Do not use heat therapy unless told by your doctor. °HOW TO USE HEAT THERAPY °There are several different kinds of heat therapy, including: °· Moist heat pack. °· Warm water bath. °· Hot water bottle. °· Electric heating pad. °· Heated gel pack. °· Heated wrap. °· Electric heating pad. °GENERAL HEAT THERAPY RECOMMENDATIONS  °· Do not sleep while using heat therapy. Only use heat therapy while you are awake. °· Your skin may turn pink while using heat therapy. Do not use heat therapy if your skin turns red. °· Do not use heat therapy if you have new pain. °· High heat or long exposure to heat can cause burns. Be careful when using heat therapy to avoid burning your skin. °· Do not use heat therapy on areas of your skin that are already irritated, such as with a rash or sunburn. °GET HELP IF:  °· You have blisters, redness, swelling (puffiness), or numbness. °· You have new pain. °· Your pain is worse. °MAKE SURE YOU: °· Understand these instructions. °· Will watch your condition. °· Will get help right away if you are not doing well or get worse. °  °This information is not intended to replace advice given to you by your health care provider. Make sure you discuss any questions you have with your health care provider. °  °Document Released: 11/19/2011 Document Revised: 09/17/2014 Document Reviewed: 10/20/2013 °Elsevier Interactive Patient Education ©2016 [employer] ° °

## 2015-09-02 NOTE — ED Provider Notes (Signed)
CSN: [id number]     Arrival date & time 09/02/15  1902 History   None    Chief Complaint  Patient presents with  . Knee Pain   (Consider location/radiation/quality/duration/timing/severity/associated sxs/prior Treatment) HPI History obtained from patient:   LOCATION: both knees, left worse than right SEVERITY:4 DURATION:since last night CONTEXT:slipped and fell onto hardwood floor striking both knees QUALITY:sore MODIFYING FACTORS:cold compresses, tylenol ASSOCIATED SYMPTOMS:painful to walk TIMING:constant OCCUPATION:  Past Medical History  Diagnosis Date  . Chronic bronchitis   . History of chicken pox   . Asthma     albuterol as needed  . Sleep apnea     cpap  . Diabetes mellitus     takes metformin daily  . Anemia     takes iron daily  . GERD (gastroesophageal reflux disease)     nexium   . Edema extremities     hx of edema in lower ext. takes lasix PRN last dose few weeks ago  . Gallbladder disease   . Pregnant    Past Surgical History  Procedure Laterality Date  . Carpal tunnel release  2008, 2010  . Cholecystectomy  01/11/2012    Procedure: LAPAROSCOPIC CHOLECYSTECTOMY WITH INTRAOPERATIVE CHOLANGIOGRAM;  Surgeon: Cherylynn Ridges, MD;  Location: [facility] OR;  Service: General;  Laterality: N/A;  . Laparoscopic gastric banding    . No past surgeries     Family History  Problem Relation Age of Onset  . Diabetes Sister   . Heart disease Other   . Hyperlipidemia Other   . Hypertension Other   . Stroke Other   . Kidney disease Other   . Thyroid disease Other   . Ovarian cancer Maternal Aunt   . Prostate cancer Maternal Uncle   . Cervical cancer Maternal Aunt   . Liver cancer Paternal Grandfather   . Liver cancer Paternal Uncle   . Anesthesia problems Mother   . Hypotension Neg Hx   . Malignant hyperthermia Neg Hx   . Pseudochol deficiency Neg Hx    Social History  Substance Use Topics  . Smoking status: Former Games developer  . Smokeless tobacco: Never Used  .  Alcohol Use: No     Comment: socially   OB History    Gravida Para Term Preterm AB TAB SAB Ectopic Multiple Living   Review of Systems ROS +'ve bilateral knee pain  Denies: HEADACHE, NAUSEA, ABDOMINAL PAIN, CHEST PAIN, CONGESTION, DYSURIA, SHORTNESS OF BREATH  Allergies  Review of patient's allergies indicates no known allergies.  Home Medications   Prior to Admission medications   Medication Sig Start Date End Date Taking? Authorizing Provider  albuterol (PROVENTIL HFA;VENTOLIN HFA) 108 (90 BASE) MCG/ACT inhaler Inhale 1 puff into the lungs every 6 (six) hours as needed for wheezing or shortness of breath.    Yes Historical Provider, MD  metFORMIN (GLUCOPHAGE) 500 MG tablet Take 500 mg by mouth 2 (two) times daily with a meal.   Yes Historical Provider, MD  acetaminophen (TYLENOL) 500 MG tablet Take 500 mg by mouth every 6 (six) hours as needed for mild pain.    Historical Provider, MD  calcium carbonate (TUMS - DOSED IN MG ELEMENTAL CALCIUM) 500 MG chewable tablet Chew 1 tablet by mouth as needed for indigestion.     Historical Provider, MD  diclofenac (VOLTAREN) 75 MG EC tablet Take 1 tablet (75 mg total) by mouth 2 (two) times daily. 04/08/15  Ozella Rocksavid J Merrell, MD  ferrous sulfate 325 (65 FE) MG tablet Take 1 tablet (325 mg total) by mouth daily with breakfast. 05/15/14   Nigel BridgemanVicki Latham, CNM  ibuprofen (ADVIL,MOTRIN) 600 MG tablet Take 1 tablet (600 mg total) by mouth every 6 (six) hours as needed. 05/15/14   Nigel BridgemanVicki Latham, CNM  oxyCODONE-acetaminophen (PERCOCET/ROXICET) 5-325 MG per tablet Take 1 tablet by mouth every 4 (four) hours as needed (for pain scale less than 7). 05/15/14   Nigel BridgemanVicki Latham, CNM  Prenatal Vit-Fe Fumarate-FA (MULTIVITAMIN-PRENATAL) 27-0.8 MG TABS tablet Take 1 tablet by mouth daily at 12 noon.    Historical Provider, MD  ranitidine (ZANTAC) 150 MG capsule Take 1 capsule (150 mg total) by mouth daily. 03/28/14   Reuben Likesavid C Keller, MD  thiamine (VITAMIN B-1)  100 MG tablet Take 100 mg by mouth every other day.     Historical Provider, MD   Meds Ordered and Administered this Visit  Medications - No data to display  BP 128/80 mmHg  Pulse 75  Temp(Src) 98.7 F (37.1 C) (Oral)  SpO2 100%  LMP 08/28/2015  Breastfeeding? No No data found.   Physical Exam  Constitutional: She appears well-developed and well-nourished.  Pulmonary/Chest: Effort normal.  Musculoskeletal: Normal range of motion. She exhibits tenderness.       Right knee: Normal.       Left knee: She exhibits bony tenderness. She exhibits normal range of motion. Tenderness found. No patellar tendon tenderness noted.       Legs:   ED Course  Procedures (including critical care time)  Labs Review Labs Reviewed - No data to display  Imaging Review Dg Knee Complete 4 Views Left  09/02/2015  CLINICAL DATA:  Fall last night, left knee injury, anterior pain EXAM: LEFT KNEE - COMPLETE 4+ VIEW COMPARISON:  None. FINDINGS: Four views of the left knee submitted. No acute fracture or subluxation. No radiopaque foreign body. No joint effusion. IMPRESSION: Negative. Electronically Signed   By: Natasha MeadLiviu  Pop M.D.   On: 09/02/2015 20:14     Visual Acuity Review  Right Eye Distance:   Left Eye Distance:   Bilateral Distance:    Right Eye Near:   Left Eye Near:    Bilateral Near:         MDM   1. Knee injury, left, initial encounter       Review of left knee xray.  No fracture Continue symptomatic treatment Continue cold compresses for next 24 hours and then start heat.  Activity as tolerated.     Tharon AquasFrank C Paitynn Mikus, [place] 09/02/15 2033

## 2016-01-12 DIAGNOSIS — E282 Polycystic ovarian syndrome: Secondary | ICD-10-CM | POA: Diagnosis not present

## 2016-01-26 DIAGNOSIS — D509 Iron deficiency anemia, unspecified: Secondary | ICD-10-CM | POA: Diagnosis not present

## 2016-01-26 DIAGNOSIS — K5900 Constipation, unspecified: Secondary | ICD-10-CM | POA: Diagnosis not present

## 2016-01-26 DIAGNOSIS — K219 Gastro-esophageal reflux disease without esophagitis: Secondary | ICD-10-CM | POA: Diagnosis not present

## 2016-01-26 DIAGNOSIS — R748 Abnormal levels of other serum enzymes: Secondary | ICD-10-CM | POA: Diagnosis not present

## 2016-01-30 DIAGNOSIS — E282 Polycystic ovarian syndrome: Secondary | ICD-10-CM | POA: Diagnosis not present

## 2016-02-13 DIAGNOSIS — N979 Female infertility, unspecified: Secondary | ICD-10-CM | POA: Diagnosis not present

## 2016-02-29 ENCOUNTER — Other Ambulatory Visit: Payer: Self-pay | Admitting: Hematology and Oncology

## 2016-03-01 DIAGNOSIS — N979 Female infertility, unspecified: Secondary | ICD-10-CM | POA: Diagnosis not present

## 2016-03-23 ENCOUNTER — Encounter: Payer: Self-pay | Admitting: Hematology and Oncology

## 2016-03-23 ENCOUNTER — Ambulatory Visit (HOSPITAL_BASED_OUTPATIENT_CLINIC_OR_DEPARTMENT_OTHER): Payer: 59 | Admitting: Hematology and Oncology

## 2016-03-23 ENCOUNTER — Ambulatory Visit (HOSPITAL_BASED_OUTPATIENT_CLINIC_OR_DEPARTMENT_OTHER): Payer: 59

## 2016-03-23 VITALS — BP 115/50 | HR 79 | Temp 98.1°F | Resp 18 | Ht 65.0 in | Wt 266.9 lb

## 2016-03-23 DIAGNOSIS — E559 Vitamin D deficiency, unspecified: Secondary | ICD-10-CM

## 2016-03-23 DIAGNOSIS — D509 Iron deficiency anemia, unspecified: Secondary | ICD-10-CM

## 2016-03-23 DIAGNOSIS — E538 Deficiency of other specified B group vitamins: Secondary | ICD-10-CM

## 2016-03-23 DIAGNOSIS — E519 Thiamine deficiency, unspecified: Secondary | ICD-10-CM

## 2016-03-23 DIAGNOSIS — Z9884 Bariatric surgery status: Secondary | ICD-10-CM

## 2016-03-23 LAB — CBC & DIFF AND RETIC
BASO%: 0.6 % (ref 0.0–2.0)
BASOS ABS: 0.1 10*3/uL (ref 0.0–0.1)
EOS ABS: 0.5 10*3/uL (ref 0.0–0.5)
EOS%: 5.7 % (ref 0.0–7.0)
HEMATOCRIT: 31 % — AB (ref 34.8–46.6)
HEMOGLOBIN: 10.3 g/dL — AB (ref 11.6–15.9)
IMMATURE RETIC FRACT: 11.5 % — AB (ref 1.60–10.00)
LYMPH#: 2.8 10*3/uL (ref 0.9–3.3)
LYMPH%: 32.9 % (ref 14.0–49.7)
MCH: 21.8 pg — AB (ref 25.1–34.0)
MCHC: 33.2 g/dL (ref 31.5–36.0)
MCV: 65.7 fL — ABNORMAL LOW (ref 79.5–101.0)
MONO#: 0.6 10*3/uL (ref 0.1–0.9)
MONO%: 7.1 % (ref 0.0–14.0)
NEUT#: 4.6 10*3/uL (ref 1.5–6.5)
NEUT%: 53.7 % (ref 38.4–76.8)
Platelets: 263 10*3/uL (ref 145–400)
RBC: 4.72 10*6/uL (ref 3.70–5.45)
RDW: 16.3 % — ABNORMAL HIGH (ref 11.2–14.5)
RETIC %: 1.03 % (ref 0.70–2.10)
Retic Ct Abs: 48.62 10*3/uL (ref 33.70–90.70)
WBC: 8.5 10*3/uL (ref 3.9–10.3)

## 2016-03-23 LAB — FERRITIN: FERRITIN: 10 ng/mL (ref 9–269)

## 2016-03-23 NOTE — Patient Instructions (Signed)

## 2016-03-24 LAB — VITAMIN B12: Vitamin B12: 543 pg/mL (ref 211–946)

## 2016-03-25 ENCOUNTER — Other Ambulatory Visit: Payer: Self-pay | Admitting: Hematology and Oncology

## 2016-03-25 NOTE — Assessment & Plan Note (Signed)
The most likely cause of her anemia is due to chronic blood loss/malabsorption syndrome. We discussed some of the risks, benefits, and alternatives of intravenous iron infusions. The patient is symptomatic from anemia and the iron level is critically low. She tolerated oral iron supplement poorly and desires to achieved higher levels of iron faster for adequate hematopoesis. Some of the side-effects to be expected including risks of infusion reactions, phlebitis, headaches, nausea and fatigue.  The patient is willing to proceed. Patient education material was dispensed.  Goal is to keep ferritin level greater than 50 We discussed blood transfusion but she declined Due to possible concurrent multi-mineral deficiencies, I will order additional work-up She will continue prenatal vitamins I will recheck labs within 1 month from now to ensure adequate replacement

## 2016-03-25 NOTE — Progress Notes (Signed)
[facility] CONSULT NOTE  Patient Care Team: Leilani Able, PA-C as PCP - General (Obstetrics and Gynecology)  CHIEF COMPLAINTS/PURPOSE OF CONSULTATION:  Severe iron deficiency, prior diagnosis of hemoglobin C, background history of bariatric surgery  HISTORY OF PRESENTING ILLNESS:  Karina [name] 35 y.o. female is here because of severe iron deficiency She has diagnosis of thalassemia and history of bariatric surgery causing multiple mineral deficiencies: specifically, B12, iron, thiamine and vitamin D Her baseline blood work in July 2015 was normal. She had no recent labs since February 2017. She was found to have severe iron deficiency anemia Recently, she desired to become pregnant. She has a daughter, born in 35. Soon after that, she has an early trimester miscarriage She was evaluated by GYN and is currently on fertility treatment She was found to have abnormal CBC from recent blood work. She is recommended Iv iron therapy prior to becoming pregnant She complained of recent chest pain on exertion, shortness of breath on minimal exertion, pre-syncopal episodes, fatigue and palpitations. She had not noticed any recent bleeding such as epistaxis, hematuria or hematochezia. She had history of menorhagia The patient denies over the counter NSAID ingestion. She is not on antiplatelets agents. Her last colonoscopy was normal on 02/26/13. She had EGD on 08/28/12 which showed gastritis She had no prior history or diagnosis of cancer. Her age appropriate screening programs are up-to-date. 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 She never donated blood or received blood transfusion The patient was prescribed oral iron supplements and she takes three daily but it caused severe constipation without success of improving her anemia  MEDICAL HISTORY:  Past Medical History  Diagnosis Date  . Chronic bronchitis   . History of chicken pox    . Asthma     albuterol as needed  . Sleep apnea     cpap  . Diabetes mellitus     takes metformin daily  . Anemia     takes iron daily  . GERD (gastroesophageal reflux disease)     nexium   . Edema extremities     hx of edema in lower ext. takes lasix PRN last dose few weeks ago  . Gallbladder disease   . Pregnant     SURGICAL HISTORY: Past Surgical History  Procedure Laterality Date  . Carpal tunnel release  2008, 2010  . Cholecystectomy  01/11/2012    Procedure: LAPAROSCOPIC CHOLECYSTECTOMY WITH INTRAOPERATIVE CHOLANGIOGRAM;  Surgeon: Cherylynn Ridges, MD;  Location: [facility] OR;  Service: General;  Laterality: N/A;  . Laparoscopic gastric banding    . No past surgeries      SOCIAL HISTORY: Social History   Social History  . Marital Status: Legally Separated    Spouse Name: N/A  . Number of Children: N/A  . Years of Education: college   Occupational History  . Not on file.   Social History Main Topics  . Smoking status: Former Games developer  . Smokeless tobacco: Never Used  . Alcohol Use: No     Comment: socially  . Drug Use: No  . 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 Not on file   Social History Narrative   PCP-Dr. Deirdre Priest in [place]    FAMILY HISTORY: Family History  Problem Relation Age of Onset  . Diabetes Sister   .  Heart disease Other   . Hyperlipidemia Other   . Hypertension Other   . Stroke Other   . Kidney disease Other   . Thyroid disease Other   . Ovarian cancer Maternal Aunt   . Prostate cancer Maternal Uncle   . Cervical cancer Maternal Aunt     cervial and thyroid  . Liver cancer Paternal Grandfather   . Liver cancer Paternal Uncle   . Anesthesia problems Mother   . Hypotension Neg Hx   . Malignant hyperthermia Neg Hx   . Pseudochol deficiency Neg Hx     ALLERGIES:  has No Known Allergies.  MEDICATIONS:  Current  Outpatient Prescriptions  Medication Sig Dispense Refill  . albuterol (PROVENTIL HFA;VENTOLIN HFA) 108 (90 BASE) MCG/ACT inhaler Inhale 1 puff into the lungs every 6 (six) hours as needed for wheezing or shortness of breath.     . calcium carbonate (TUMS - DOSED IN MG ELEMENTAL CALCIUM) 500 MG chewable tablet Chew 1 tablet by mouth as needed for indigestion.     . metFORMIN (GLUCOPHAGE) 500 MG tablet Take 1,000 mg by mouth 2 (two) times daily with a meal.     . Prenatal Vit-Fe Fumarate-FA (MULTIVITAMIN-PRENATAL) 27-0.8 MG TABS tablet Take 1 tablet by mouth daily at 12 noon.    . ranitidine (ZANTAC) 150 MG capsule Take 1 capsule (150 mg total) by mouth daily. 30 capsule 0   No current facility-administered medications for this visit.    REVIEW OF SYSTEMS:   Constitutional: Denies fevers, chills or abnormal night sweats Eyes: Denies blurriness of vision, double vision or watery eyes Ears, nose, mouth, throat, and face: Denies mucositis or sore throats Skin: Denies abnormal skin rashes Lymphatics: Denies new lymphadenopathy or easy bruising Neurological:Denies numbness, tingling or new weaknesses Behavioral/Psych: Mood is stable, no new changes  All other systems were reviewed with the patient and are negative.  PHYSICAL EXAMINATION: ECOG PERFORMANCE STATUS: 1 - Symptomatic but completely ambulatory  Filed Vitals:   03/23/16 1037  BP: 115/50  Pulse: 79  Temp: 98.1 F (36.7 C)  Resp: 18   Filed Weights   03/23/16 1037  Weight: 266 lb 14.4 oz (121.065 kg)    GENERAL:alert, no distress and comfortable. She is obese SKIN: skin color, texture, turgor are normal, no rashes or significant lesions EYES: normal, conjunctiva are pink and non-injected, sclera clear OROPHARYNX:no exudate, no erythema and lips, buccal mucosa, and tongue normal  NECK: supple, thyroid normal size, non-tender, without nodularity LYMPH:  no palpable lymphadenopathy in the cervical, axillary or inguinal LUNGS:  clear to auscultation and percussion with normal breathing effort HEART: regular rate & rhythm and no murmurs and no lower extremity edema ABDOMEN:abdomen soft, non-tender and normal bowel sounds Musculoskeletal:no cyanosis of digits and no clubbing  PSYCH: alert & oriented x 3 with fluent speech NEURO: no focal motor/sensory deficits  ASSESSMENT & PLAN:  Iron deficiency anemia The most likely cause of her anemia is due to chronic blood loss/malabsorption syndrome. We discussed some of the risks, benefits, and alternatives of intravenous iron infusions. The patient is symptomatic from anemia and the iron level is critically low. She tolerated oral iron supplement poorly and desires to achieved higher levels of iron faster for adequate hematopoesis. Some of the side-effects to be expected including risks of infusion reactions, phlebitis, headaches, nausea and fatigue.  The patient is willing to proceed. Patient education material was dispensed.  Goal is to keep ferritin level greater than 50 We discussed blood transfusion but she  declined Due to possible concurrent multi-mineral deficiencies, I will order additional work-up She will continue prenatal vitamins I will recheck labs within 1 month from now to ensure adequate replacement  H/O laparoscopic adjustable gastric banding She had prior diagnosis of thiamin, Vitamin B12, iron and vitamin D deficiencies She is not compliant taking her vitamins I will order additional work up and replace as needed  Orders Placed This Encounter  Procedures  . CBC & Diff and Retic    Standing Status: Future     Number of Occurrences: 1     Standing Expiration Date: 04/27/2017  . Ferritin    Standing Status: Future     Number of Occurrences: 1     Standing Expiration Date: 04/27/2017  . Vitamin B12    Standing Status: Future     Number of Occurrences: 1     Standing Expiration Date: 04/27/2017  . Vitamin D 25 hydroxy    Standing Status: Future      Number of Occurrences: 1     Standing Expiration Date: 04/27/2017  . Vitamin B1    Standing Status: Future     Number of Occurrences: 1     Standing Expiration Date: 04/27/2017  . Ferritin    Standing Status: Future     Number of Occurrences: 1     Standing Expiration Date: 04/27/2017  . CBC & Diff and Retic    Standing Status: Future     Number of Occurrences: 1     Standing Expiration Date: 04/27/2017  . Vitamin B12    Standing Status: Future     Number of Occurrences: 1     Standing Expiration Date: 04/27/2017  . Vitamin D 25 hydroxy    Standing Status: Future     Number of Occurrences:      Standing Expiration Date: 04/27/2017  . Vitamin B1    Standing Status: Future     Number of Occurrences:      Standing Expiration Date: 04/27/2017     All questions were answered. The patient knows to call the clinic with any problems, questions or concerns. I spent 40 minutes counseling the patient face to face. The total time spent in the appointment was 50 minutes and more than 50% was on counseling.     [facility] [address], Eimi Viney, MD 03/25/2016 4:24 PM

## 2016-03-25 NOTE — Assessment & Plan Note (Signed)
She had prior diagnosis of thiamin, Vitamin B12, iron and vitamin D deficiencies She is not compliant taking her vitamins I will order additional work up and replace as needed

## 2016-03-28 ENCOUNTER — Telehealth: Payer: Self-pay | Admitting: *Deleted

## 2016-03-28 NOTE — Telephone Encounter (Signed)
Patient called with questions about lab results, Feraheme treatment, blood transfusions with her sister donating blood.  Lab results provided.  As for blood transfusions directed to call [facility].  Symptoms reported are struggling to work 12 hr shifts due to decreased performance and can't produce more or work faster   Will notify Registration to correct name to  First name: Karina [name].  LastMarian Sorrow name: Karrie Meresaula De Cruz

## 2016-03-30 ENCOUNTER — Other Ambulatory Visit: Payer: Self-pay | Admitting: Hematology and Oncology

## 2016-03-30 ENCOUNTER — Ambulatory Visit: Payer: 59

## 2016-03-30 ENCOUNTER — Ambulatory Visit (HOSPITAL_BASED_OUTPATIENT_CLINIC_OR_DEPARTMENT_OTHER): Payer: 59

## 2016-03-30 ENCOUNTER — Other Ambulatory Visit: Payer: Self-pay | Admitting: Medical Oncology

## 2016-03-30 VITALS — BP 114/57 | HR 65 | Temp 98.0°F | Resp 16

## 2016-03-30 DIAGNOSIS — D509 Iron deficiency anemia, unspecified: Secondary | ICD-10-CM

## 2016-03-30 MED ORDER — SODIUM CHLORIDE 0.9 % IV SOLN
510.0000 mg | Freq: Once | INTRAVENOUS | Status: AC
  Administered 2016-03-30: 510 mg via INTRAVENOUS
  Filled 2016-03-30: qty 17

## 2016-03-30 MED ORDER — SODIUM CHLORIDE 0.9 % IV SOLN
Freq: Once | INTRAVENOUS | Status: AC
  Administered 2016-03-30: 12:00:00 via INTRAVENOUS

## 2016-03-30 NOTE — Patient Instructions (Signed)

## 2016-04-03 ENCOUNTER — Encounter: Payer: Self-pay | Admitting: Hematology and Oncology

## 2016-04-06 ENCOUNTER — Ambulatory Visit (HOSPITAL_BASED_OUTPATIENT_CLINIC_OR_DEPARTMENT_OTHER): Payer: 59

## 2016-04-06 ENCOUNTER — Ambulatory Visit: Payer: 59

## 2016-04-06 ENCOUNTER — Telehealth: Payer: Self-pay | Admitting: *Deleted

## 2016-04-06 VITALS — BP 99/52 | HR 75 | Temp 98.6°F | Resp 18

## 2016-04-06 DIAGNOSIS — E519 Thiamine deficiency, unspecified: Secondary | ICD-10-CM

## 2016-04-06 DIAGNOSIS — D509 Iron deficiency anemia, unspecified: Secondary | ICD-10-CM | POA: Diagnosis not present

## 2016-04-06 DIAGNOSIS — E559 Vitamin D deficiency, unspecified: Secondary | ICD-10-CM

## 2016-04-06 MED ORDER — SODIUM CHLORIDE 0.9 % IV SOLN
510.0000 mg | Freq: Once | INTRAVENOUS | Status: AC
  Administered 2016-04-06: 510 mg via INTRAVENOUS
  Filled 2016-04-06: qty 17

## 2016-04-06 MED ORDER — SODIUM CHLORIDE 0.9 % IV SOLN
Freq: Once | INTRAVENOUS | Status: AC
  Administered 2016-04-06: 10:00:00 via INTRAVENOUS

## 2016-04-06 NOTE — Patient Instructions (Signed)

## 2016-04-06 NOTE — Telephone Encounter (Signed)
Pt called this afternoon to inform nurse that she wants to draw her own blood for Vitamin B1 and Vitamin D.  She wanted labs done today, but she was stuck 5 times and we were unable to get any blood.   Informed pt she can wait until next appt to have these labs done but she doesn't want to wait.  She thinks Dr. Bertis Ruddy wanted them on her last visit and the lab "missed" them.  Informed pt these labs are not urgent and can wait for next month,  But pt is insistent and she also says she doesn't want our office to draw her blood again ever because we had to stick her so many times.  Informed pt nurse will have to confirm w/ Dr. Bertis Ruddy what labs she wants.  Pt will need lab appointment to drop blood off and new orders placed in computer.   Pt verbalized understanding.

## 2016-04-06 NOTE — Progress Notes (Signed)
Injection to be given in Infusion Room 

## 2016-04-06 NOTE — Progress Notes (Signed)
Pt reported that labs not done 03/23/16.  Reviewed chart & labs ordered by Dr Bertis Ruddy not completed.  Tried to draw lab from IV site without success.  Nurse Tech tried to draw blood with two unsuccessful sticks & pt sent to lab for lab draw.

## 2016-04-09 ENCOUNTER — Other Ambulatory Visit: Payer: Self-pay | Admitting: Hematology and Oncology

## 2016-04-09 DIAGNOSIS — D509 Iron deficiency anemia, unspecified: Secondary | ICD-10-CM

## 2016-04-09 DIAGNOSIS — E519 Thiamine deficiency, unspecified: Secondary | ICD-10-CM

## 2016-04-09 DIAGNOSIS — E559 Vitamin D deficiency, unspecified: Secondary | ICD-10-CM

## 2016-04-09 DIAGNOSIS — Z9884 Bariatric surgery status: Secondary | ICD-10-CM

## 2016-04-09 NOTE — Telephone Encounter (Signed)
I placed orders for cbc, ferritin, vitamin B1 and Vitamin D. No need to check B12 level. We just checked and it was OK Karina [name], please confirm with her those labs

## 2016-04-14 ENCOUNTER — Other Ambulatory Visit
Admission: RE | Admit: 2016-04-14 | Discharge: 2016-04-14 | Disposition: A | Discharge status: Home / Self Care | Payer: 59 | Source: Ambulatory Visit | Attending: Hematology and Oncology | Admitting: Hematology and Oncology

## 2016-04-14 DIAGNOSIS — E519 Thiamine deficiency, unspecified: Secondary | ICD-10-CM | POA: Diagnosis not present

## 2016-04-14 DIAGNOSIS — E559 Vitamin D deficiency, unspecified: Secondary | ICD-10-CM | POA: Diagnosis not present

## 2016-04-14 DIAGNOSIS — D509 Iron deficiency anemia, unspecified: Secondary | ICD-10-CM | POA: Diagnosis not present

## 2016-04-14 LAB — FERRITIN: FERRITIN: 366 ng/mL — AB (ref 11–307)

## 2016-04-16 LAB — VITAMIN D 25 HYDROXY (VIT D DEFICIENCY, FRACTURES): VIT D 25 HYDROXY: 28.3 ng/mL — AB (ref 30.0–100.0)

## 2016-04-17 ENCOUNTER — Other Ambulatory Visit: Payer: Self-pay | Admitting: Hematology and Oncology

## 2016-04-17 DIAGNOSIS — D509 Iron deficiency anemia, unspecified: Secondary | ICD-10-CM

## 2016-04-17 LAB — VITAMIN B1

## 2016-04-19 ENCOUNTER — Telehealth: Payer: Self-pay | Admitting: *Deleted

## 2016-04-19 DIAGNOSIS — D509 Iron deficiency anemia, unspecified: Secondary | ICD-10-CM

## 2016-04-19 DIAGNOSIS — E559 Vitamin D deficiency, unspecified: Secondary | ICD-10-CM

## 2016-04-19 DIAGNOSIS — E519 Thiamine deficiency, unspecified: Secondary | ICD-10-CM

## 2016-04-19 MED ORDER — ERGOCALCIFEROL 1.25 MG (50000 UT) PO CAPS: 50000.0000 [IU] | ORAL_CAPSULE | ORAL | 0 refills | Status: AC

## 2016-04-19 NOTE — Telephone Encounter (Signed)
Dr. Bertis RuddyGorsuch reviewed lab results from 8/5.  Vitamin D low and she prescribed Vitamin D weekly for 12 weeks [facility]   Vit B1 did not result d/t something wrong w/ specimen.  Pt took her labs to another lab outside of cancer center.   Ferritin is improved.  Dr. Bertis RuddyGorsuch instructs for pt to have labs repeated in 3 months around 10/27.   Dr. Bertis RuddyGorsuch ordered CBC w/ Retic,  Vitamin B1,  Vitamin D and Ferritin.    LVM for pt informing her of above and will make lab appt for her here in 3 months but if she wants to do labs again outside we can give her a Rx or Use orders placed in Epic.   Asked pt to call back for any questions/ concerns.

## 2016-04-20 ENCOUNTER — Telehealth: Payer: Self-pay | Admitting: *Deleted

## 2016-04-20 ENCOUNTER — Telehealth: Payer: Self-pay | Admitting: Hematology and Oncology

## 2016-04-20 NOTE — Telephone Encounter (Signed)
Pt left VM she understands Vitamin B1 did not result.  She does not want to wait 3 months to have it checked.   I called pt back and left her a message to call us again if she needs Dr. Bertis RuddyGorsuch to put order in computer for her to have done at hospital or if she would like to pick up a Rx for it to be done somewhere else.  I also wanted to make sure she did understand new Rx for Vitamin D was sent to her pharmacy to take weekly for 12 weeks.

## 2016-04-20 NOTE — Telephone Encounter (Signed)
pt will check mychart

## 2016-04-26 ENCOUNTER — Other Ambulatory Visit: Payer: Self-pay | Admitting: Gastroenterology

## 2016-04-26 DIAGNOSIS — K219 Gastro-esophageal reflux disease without esophagitis: Secondary | ICD-10-CM | POA: Diagnosis not present

## 2016-04-26 DIAGNOSIS — R1012 Left upper quadrant pain: Secondary | ICD-10-CM | POA: Diagnosis not present

## 2016-04-26 DIAGNOSIS — D509 Iron deficiency anemia, unspecified: Secondary | ICD-10-CM | POA: Diagnosis not present

## 2016-04-28 ENCOUNTER — Telehealth: Payer: Self-pay | Admitting: Hematology and Oncology

## 2016-04-28 NOTE — Telephone Encounter (Signed)
Returned pt's call about rescheduling 8/245 appt. Lvm advising MD 1st avail is in Sept and asked pt to call back if she wants to r/s.

## 2016-05-03 ENCOUNTER — Other Ambulatory Visit: Payer: Self-pay

## 2016-05-03 ENCOUNTER — Ambulatory Visit: Payer: Self-pay

## 2016-05-03 ENCOUNTER — Ambulatory Visit (HOSPITAL_BASED_OUTPATIENT_CLINIC_OR_DEPARTMENT_OTHER): Payer: 59 | Admitting: Hematology and Oncology

## 2016-05-03 ENCOUNTER — Encounter: Payer: Self-pay | Admitting: Hematology and Oncology

## 2016-05-03 DIAGNOSIS — E538 Deficiency of other specified B group vitamins: Secondary | ICD-10-CM

## 2016-05-03 DIAGNOSIS — D509 Iron deficiency anemia, unspecified: Secondary | ICD-10-CM

## 2016-05-03 DIAGNOSIS — E519 Thiamine deficiency, unspecified: Secondary | ICD-10-CM

## 2016-05-03 DIAGNOSIS — Z9884 Bariatric surgery status: Secondary | ICD-10-CM

## 2016-05-03 DIAGNOSIS — E559 Vitamin D deficiency, unspecified: Secondary | ICD-10-CM

## 2016-05-03 DIAGNOSIS — D582 Other hemoglobinopathies: Secondary | ICD-10-CM | POA: Diagnosis not present

## 2016-05-03 NOTE — Progress Notes (Signed)
[facility] OFFICE PROGRESS NOTE  Pcp Not In System SUMMARY OF HEMATOLOGIC HISTORY:  Severe iron deficiency, prior diagnosis of hemoglobin C, background history of bariatric surgery  HISTORY OF PRESENTING ILLNESS:  Whitman HeroOlga Paula [name] is here because of severe iron deficiency She has diagnosis of thalassemia and history of bariatric surgery causing multiple mineral deficiencies: specifically, B12, iron, thiamine and vitamin D Her baseline blood work in July 2015 was normal. She had no recent labs since February 2017. She was found to have severe iron deficiency anemia Recently, she desired to become pregnant. She has a daughter, born in [id number]. Soon after that, she has an early trimester miscarriage She was evaluated by GYN and is currently on fertility treatment She was found to have abnormal CBC from recent blood work. She is recommended Iv iron therapy prior to becoming pregnant She complained of recent chest pain on exertion, shortness of breath on minimal exertion, pre-syncopal episodes, fatigue and palpitations. She had not noticed any recent bleeding such as epistaxis, hematuria or hematochezia. She had history of menorhagia The patient denies over the counter NSAID ingestion. She is not on antiplatelets agents. Her last colonoscopy was normal on 02/26/13. She had EGD on 08/28/12 which showed gastritis She had no prior history or diagnosis of cancer. Her age appropriate screening programs are up-to-date. 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 She never donated blood or received blood transfusion The patient was prescribed oral iron supplements and she takes three daily but it caused severe constipation without success of improving her anemia She received 2 doses of intravenous iron in July 2017 with improvement of ferritin level INTERVAL HISTORY: Karina BottomOlga Paula [name] 35 y.o. female returns for further follow-up. She complained of mild  fatigue. She is compliant taking vitamin D supplement. The patient denies any recent signs or symptoms of bleeding such as spontaneous epistaxis, hematuria or hematochezia.   I have reviewed the past medical history, past surgical history, social history and family history with the patient and they are unchanged from previous note.  ALLERGIES:  has No Known Allergies.  MEDICATIONS:  Current Outpatient Prescriptions  Medication Sig Dispense Refill  . albuterol (PROVENTIL HFA;VENTOLIN HFA) 108 (90 BASE) MCG/ACT inhaler Inhale 1 puff into the lungs every 6 (six) hours as needed for wheezing or shortness of breath.     . calcium carbonate (TUMS - DOSED IN MG ELEMENTAL CALCIUM) 500 MG chewable tablet Chew 1 tablet by mouth as needed for indigestion.     . ergocalciferol (VITAMIN D2) 50000 units capsule Take 1 capsule (50,000 Units total) by mouth once a week. 12 capsule 0  . metFORMIN (GLUCOPHAGE) 500 MG tablet Take 1,000 mg by mouth 2 (two) times daily with a meal.     . Prenatal Vit-Fe Fumarate-FA (MULTIVITAMIN-PRENATAL) 27-0.8 MG TABS tablet Take 1 tablet by mouth daily at 12 noon.    . ranitidine (ZANTAC) 150 MG capsule Take 1 capsule (150 mg total) by mouth daily. 30 capsule 0   No current facility-administered medications for this visit.      REVIEW OF SYSTEMS:   Constitutional: Denies fevers, chills or night sweats Eyes: Denies blurriness of vision Ears, nose, mouth, throat, and face: Denies mucositis or sore throat Respiratory: Denies cough, dyspnea or wheezes Cardiovascular: Denies palpitation, chest discomfort or lower extremity swelling Gastrointestinal:  Denies nausea, heartburn or change in bowel habits Skin: Denies abnormal skin rashes Lymphatics: Denies new lymphadenopathy or easy bruising Neurological:Denies  numbness, tingling or new weaknesses Behavioral/Psych: Mood is stable, no new changes  All other systems were reviewed with the patient and are negative.  PHYSICAL  EXAMINATION: ECOG PERFORMANCE STATUS: 1 - Symptomatic but completely ambulatory  Vitals:   05/03/16 1113  BP: (!) 98/54  Pulse: 64  Resp: 18  Temp: 98.2 F (36.8 C)   Filed Weights   05/03/16 1113  Weight: 258 lb 8 oz (117.3 kg)    GENERAL:alert, no distress and comfortable SKIN: skin color, texture, turgor are normal, no rashes or significant lesions EYES: normal, Conjunctiva are pink and non-injected, sclera clear Musculoskeletal:no cyanosis of digits and no clubbing  NEURO: alert & oriented x 3 with fluent speech, no focal motor/sensory deficits  LABORATORY DATA:  I have reviewed the data as listed     Component Value Date/Time   NA 137 03/28/2014 1038   K 4.1 03/28/2014 1038   CL 103 03/28/2014 1038   CO2 23 03/14/2014 1012   GLUCOSE 75 03/28/2014 1038   BUN 6 03/28/2014 1038   CREATININE 0.60 03/28/2014 1038   CALCIUM 9.0 03/14/2014 1012   PROT 7.9 08/21/2012 1010   ALBUMIN 3.2 (L) 08/21/2012 1010   AST 30 08/21/2012 1010   ALT 41 (H) 08/21/2012 1010   ALKPHOS 131 (H) 08/21/2012 1010   BILITOT 0.5 08/21/2012 1010   GFRNONAA >90 03/14/2014 1012   GFRAA >90 03/14/2014 1012    No results found for: SPEP, UPEP  Lab Results  Component Value Date   WBC 8.5 03/23/2016   NEUTROABS 4.6 03/23/2016   HGB 10.3 (L) 03/23/2016   HCT 31.0 (L) 03/23/2016   MCV 65.7 (L) 03/23/2016   PLT 263 03/23/2016      Chemistry      Component Value Date/Time   NA 137 03/28/2014 1038   K 4.1 03/28/2014 1038   CL 103 03/28/2014 1038   CO2 23 03/14/2014 1012   BUN 6 03/28/2014 1038   CREATININE 0.60 03/28/2014 1038      Component Value Date/Time   CALCIUM 9.0 03/14/2014 1012   ALKPHOS 131 (H) 08/21/2012 1010   AST 30 08/21/2012 1010   ALT 41 (H) 08/21/2012 1010   BILITOT 0.5 08/21/2012 1010      ASSESSMENT & PLAN:  Iron deficiency anemia The patient has intermittent iron deficiency anemia due to prior history of bariatric surgery. Recent blood work suggested  adequate  iron replacement therapy. I gave her a slip of paper so that she can get blood work done in her workplace in 3 months. 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 She is on high-dose replacement therapy. I recommend rechecking vitamin D level in the future  Vitamin B12 deficiency Recent repeat blood work suggested adequate replacement. We will recheck in 3 months  Thiamin deficiency She has prior diagnosis of thiamine deficiency. We will recheck in 3 months   All questions were answered. The patient knows to call the clinic with any problems, questions or concerns. No barriers to learning was detected.  I spent 15 minutes counseling the patient face to face. The total time spent in the appointment was 20 minutes and more than 50% was on counseling.  [facility] [address], Brendin Situ, MD 8/24/20171:39 PM

## 2016-05-03 NOTE — Assessment & Plan Note (Signed)
She has prior diagnosis of thiamine deficiency. We will recheck in 3 months

## 2016-05-03 NOTE — Assessment & Plan Note (Signed)
Recent repeat blood work suggested adequate replacement. We will recheck in 3 months

## 2016-05-03 NOTE — Assessment & Plan Note (Signed)
The patient has severe vitamin D deficiency. She is on high-dose replacement therapy. I recommend rechecking vitamin D level in the future

## 2016-05-03 NOTE — Assessment & Plan Note (Signed)
The patient has intermittent iron deficiency anemia due to prior history of bariatric surgery. Recent blood work suggested adequate  iron replacement therapy. I gave her a slip of paper so that she can get blood work done in her workplace in 3 months. She will receive intermittent iron infusion in the future if ferritin is less than 50

## 2016-05-03 NOTE — Assessment & Plan Note (Signed)
The patient have diagnosis of hemoglobin C disorder She will always have microcytic anemia and we will treat her only if ferritin is less than 50

## 2016-05-10 ENCOUNTER — Ambulatory Visit
Admission: RE | Admit: 2016-05-10 | Discharge: 2016-05-10 | Disposition: A | Discharge status: Home / Self Care | Payer: 59 | Source: Ambulatory Visit | Attending: Gastroenterology | Admitting: Gastroenterology

## 2016-05-10 ENCOUNTER — Other Ambulatory Visit: Payer: 59

## 2016-05-10 DIAGNOSIS — R1012 Left upper quadrant pain: Secondary | ICD-10-CM

## 2016-05-10 MED ORDER — IOPAMIDOL (ISOVUE-300) INJECTION 61%
100.0000 mL | Freq: Once | INTRAVENOUS | Status: DC | PRN
Start: 2016-05-10 — End: 2016-05-11

## 2016-05-11 DIAGNOSIS — G4733 Obstructive sleep apnea (adult) (pediatric): Secondary | ICD-10-CM | POA: Diagnosis not present

## 2016-05-11 DIAGNOSIS — Z23 Encounter for immunization: Secondary | ICD-10-CM | POA: Diagnosis not present

## 2016-05-11 DIAGNOSIS — J4520 Mild intermittent asthma, uncomplicated: Secondary | ICD-10-CM | POA: Insufficient documentation

## 2016-05-11 DIAGNOSIS — D582 Other hemoglobinopathies: Secondary | ICD-10-CM | POA: Diagnosis not present

## 2016-05-11 DIAGNOSIS — Z0000 Encounter for general adult medical examination without abnormal findings: Secondary | ICD-10-CM | POA: Diagnosis not present

## 2016-05-11 DIAGNOSIS — Z9989 Dependence on other enabling machines and devices: Secondary | ICD-10-CM | POA: Diagnosis not present

## 2016-05-11 DIAGNOSIS — R7303 Prediabetes: Secondary | ICD-10-CM | POA: Insufficient documentation

## 2016-05-11 DIAGNOSIS — Z7689 Persons encountering health services in other specified circumstances: Secondary | ICD-10-CM | POA: Diagnosis not present

## 2016-05-11 DIAGNOSIS — M25562 Pain in left knee: Secondary | ICD-10-CM | POA: Diagnosis not present

## 2016-06-01 DIAGNOSIS — M25562 Pain in left knee: Secondary | ICD-10-CM | POA: Diagnosis not present

## 2016-06-05 DIAGNOSIS — N979 Female infertility, unspecified: Secondary | ICD-10-CM | POA: Diagnosis not present

## 2016-06-14 ENCOUNTER — Telehealth: Payer: Self-pay

## 2016-06-14 NOTE — Telephone Encounter (Signed)
LM for patient to reschedule her appt on the 12th. Our office is closing early that day and need to reschedule her appt.

## 2016-06-21 ENCOUNTER — Institutional Professional Consult (permissible substitution): Payer: 59 | Admitting: Neurology

## 2016-06-22 ENCOUNTER — Telehealth: Payer: 59 | Admitting: Family

## 2016-06-22 DIAGNOSIS — M5442 Lumbago with sciatica, left side: Secondary | ICD-10-CM

## 2016-06-22 DIAGNOSIS — M5441 Lumbago with sciatica, right side: Secondary | ICD-10-CM | POA: Diagnosis not present

## 2016-06-22 MED ORDER — ETODOLAC 300 MG PO CAPS: 300.0000 mg | ORAL_CAPSULE | Freq: Two times a day (BID) | ORAL | 0 refills | Status: DC

## 2016-06-22 MED ORDER — BACLOFEN 10 MG PO TABS: 10.0000 mg | ORAL_TABLET | Freq: Three times a day (TID) | ORAL | 0 refills | Status: DC | PRN

## 2016-06-22 NOTE — Progress Notes (Signed)

## 2016-07-02 ENCOUNTER — Other Ambulatory Visit (HOSPITAL_COMMUNITY): Payer: Self-pay | Admitting: Obstetrics and Gynecology

## 2016-07-02 DIAGNOSIS — N979 Female infertility, unspecified: Secondary | ICD-10-CM

## 2016-07-04 ENCOUNTER — Ambulatory Visit (HOSPITAL_COMMUNITY)
Admission: RE | Admit: 2016-07-04 | Discharge: 2016-07-04 | Disposition: A | Discharge status: Home / Self Care | Payer: 59 | Source: Ambulatory Visit | Attending: Obstetrics and Gynecology | Admitting: Obstetrics and Gynecology

## 2016-07-04 DIAGNOSIS — N7011 Chronic salpingitis: Secondary | ICD-10-CM | POA: Insufficient documentation

## 2016-07-04 DIAGNOSIS — N979 Female infertility, unspecified: Secondary | ICD-10-CM | POA: Insufficient documentation

## 2016-07-04 MED ORDER — IOPAMIDOL (ISOVUE-300) INJECTION 61%
30.0000 mL | Freq: Once | INTRAVENOUS | Status: AC | PRN
  Administered 2016-07-04: 6 mL

## 2016-07-05 ENCOUNTER — Institutional Professional Consult (permissible substitution): Payer: 59 | Admitting: Neurology

## 2016-07-06 ENCOUNTER — Other Ambulatory Visit: Payer: 59

## 2016-07-12 ENCOUNTER — Encounter: Payer: Self-pay | Admitting: Neurology

## 2016-07-13 DIAGNOSIS — M25562 Pain in left knee: Secondary | ICD-10-CM | POA: Diagnosis not present

## 2016-07-19 ENCOUNTER — Encounter: Payer: Self-pay | Admitting: Internal Medicine

## 2016-07-19 ENCOUNTER — Ambulatory Visit (INDEPENDENT_AMBULATORY_CARE_PROVIDER_SITE_OTHER): Payer: 59 | Admitting: Internal Medicine

## 2016-07-19 ENCOUNTER — Other Ambulatory Visit (INDEPENDENT_AMBULATORY_CARE_PROVIDER_SITE_OTHER): Payer: 59

## 2016-07-19 VITALS — BP 120/78 | HR 78 | Ht 64.5 in | Wt 263.6 lb

## 2016-07-19 DIAGNOSIS — J4520 Mild intermittent asthma, uncomplicated: Secondary | ICD-10-CM

## 2016-07-19 DIAGNOSIS — N979 Female infertility, unspecified: Secondary | ICD-10-CM | POA: Diagnosis not present

## 2016-07-19 LAB — CBC WITH DIFFERENTIAL/PLATELET
BASOS PCT: 0.8 % (ref 0.0–3.0)
Basophils Absolute: 0.1 10*3/uL (ref 0.0–0.1)
EOS ABS: 0.5 10*3/uL (ref 0.0–0.7)
EOS PCT: 5 % (ref 0.0–5.0)
HEMATOCRIT: 37.5 % (ref 36.0–46.0)
HEMOGLOBIN: 12.6 g/dL (ref 12.0–15.0)
LYMPHS PCT: 29.7 % (ref 12.0–46.0)
Lymphs Abs: 3.2 10*3/uL (ref 0.7–4.0)
MCHC: 33.8 g/dL (ref 30.0–36.0)
MCV: 77.5 fl — AB (ref 78.0–100.0)
MONOS PCT: 7.3 % (ref 3.0–12.0)
Monocytes Absolute: 0.8 10*3/uL (ref 0.1–1.0)
NEUTROS ABS: 6.1 10*3/uL (ref 1.4–7.7)
Neutrophils Relative %: 57.2 % (ref 43.0–77.0)
PLATELETS: 224 10*3/uL (ref 150.0–400.0)
RBC: 4.84 Mil/uL (ref 3.87–5.11)
RDW: 14.3 % (ref 11.5–15.5)
WBC: 10.7 10*3/uL — AB (ref 4.0–10.5)

## 2016-07-19 LAB — NITRIC OXIDE: Nitric Oxide: 48

## 2016-07-19 MED ORDER — MOMETASONE FURO-FORMOTEROL FUM 100-5 MCG/ACT IN AERO
2.0000 | INHALATION_SPRAY | Freq: Two times a day (BID) | RESPIRATORY_TRACT | 0 refills | Status: DC
Start: 2016-07-19 — End: 2016-07-19

## 2016-07-19 MED ORDER — MOMETASONE FURO-FORMOTEROL FUM 100-5 MCG/ACT IN AERO: 2.0000 | INHALATION_SPRAY | Freq: Two times a day (BID) | RESPIRATORY_TRACT | 11 refills | Status: DC

## 2016-07-19 MED ORDER — MOMETASONE FURO-FORMOTEROL FUM 100-5 MCG/ACT IN AERO: 2.0000 | INHALATION_SPRAY | Freq: Two times a day (BID) | RESPIRATORY_TRACT | 0 refills | Status: DC

## 2016-07-19 NOTE — Progress Notes (Signed)
Subjective:    Patient ID: Karina [name], female    DOB: [birth year], 35 y.o.   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   07/19/2016 1st  Pulmonary office visit/ Karina [name]   Chief Complaint  Patient presents with  . Pulmonary Consult    Referred by Gardiner Fantiole Podraza. Here to est care for Asthma. Pt states she was dxed with asthma as a child. SHe is not having any symptoms today.    has ventolin but not neb and wants one/ symptoms of cough /wheeze/ sob highly variable and absent most of the year  Main trigger is colds/ virus spring and fall  Last pred ? Long time but says typically flares 4 x year  No obvious day to day or daytime variability or assoc excess/ purulent sputum or mucus plugs or hemoptysis or cp or chest tightness, or overt  hb symptoms. No unusual exp hx or h/o childhood pna/ asthma or knowledge of premature birth.  Sleeping ok without nocturnal  or early am exacerbation  of respiratory  c/o's or need for noct saba. Also denies any obvious fluctuation of symptoms with weather or environmental changes or other aggravating or alleviating factors except as outlined above   Current Medications, Allergies, Complete Past Medical History, Past Surgical History, Family History, and Social History were reviewed in [facility] electronic medical record.         Review of Systems  Constitutional: Negative for chills, fever and unexpected weight change.  HENT: Positive for congestion, dental problem and sneezing. Negative for ear pain, nosebleeds, postnasal drip, rhinorrhea, sinus pressure, sore throat, trouble swallowing and voice change.   Eyes: Negative for  visual disturbance.  Respiratory: Negative for cough, choking and shortness of breath.   Cardiovascular: Negative for chest pain and leg swelling.  Gastrointestinal: Negative for abdominal pain, diarrhea and vomiting.  Genitourinary: Negative for difficulty urinating.  Musculoskeletal: Positive for arthralgias.  Skin: Negative for rash.  Neurological: Negative for tremors, syncope and headaches.  Hematological: Does not bruise/bleed easily.       Objective:   Physical Exam  amb bf nad  Wt Readings from Last 3 Encounters:  07/19/16 263 lb 9.6 oz (119.6 kg)  05/03/16 258 lb 8 oz (117.3 kg)  03/23/16 266 lb 14.4 oz (121.1 kg)    Vital signs reviewed   HEENT: nl dentition, turbinates, and oropharynx. Nl external ear canals without cough reflex   NECK :  without JVD/Nodes/TM/ nl carotid upstrokes bilaterally   LUNGS: no acc muscle use,  Nl contour chest which is clear to A and P bilaterally without cough on insp or exp maneuvers   CV:  RRR  no s3 or murmur or increase in P2, no edema   ABD:  soft and nontender with nl inspiratory excursion in the supine position. No bruits or organomegaly, bowel sounds nl  MS:  Nl gait/ ext warm without deformities, calf tenderness, cyanosis or clubbing No obvious joint restrictions   SKIN: warm and dry without lesions    NEURO:  alert, approp, nl sensorium with  no motor deficits  Assessment & Plan:

## 2016-07-19 NOTE — Patient Instructions (Addendum)
Need the pneumovax (23) in 05/2017    Whenever you start needing your albuterol, immediately start dulera 100 Take 2 puffs first thing in am and then another 2 puffs about 12 hours later and continue to use it for a week then taper back off  Work on inhaler technique:  relax and gently blow all the way out then take a nice smooth deep breath back in, triggering the inhaler at same time you start breathing in.  Hold for up to 5 seconds if you can. Blow out thru nose. Rinse and gargle with water when done      Please remember to go to the lab  department downstairs for your tests - we will call you with the results when they are available.     Please schedule a follow up visit in 3 months but call sooner if needed

## 2016-07-19 NOTE — Assessment & Plan Note (Addendum)
FENO 07/19/2016  =   48 Spirometry 07/19/2016  wnl   - 07/19/2016  After extensive coaching HFA effectiveness =    75% > try dulera 100 2bid whenever symptoms arise  She clearly has active eos inflammation suggested by FENO and h/o freq exac up to 4 x yearly and needs there fore "step up" therapy but is very likely not to comply with maint ICS based on my initial eval and instead is totally focuses on access to saba in multiple forms "because that's the way [place]"    She is however at high risk asthma M and M using this strategy and I explained that saba treats the symptoms but not the dz and hopefully convinced here it would make more sense to start a laba/ics at onset of symptoms for at least a week if she starts needing any saba at all > either dulera 100 or symb 80 2bid would be approp for this purpose  Total time devoted to counseling  = 35/8753m review case with pt/ discussion of options/alternatives/ personally creating written instructions  in presence of pt  then going over those specific  Instructions directly with the pt including how to use all of the meds but in particular covering each new medication in detail and the difference between the maintenance/automatic meds and the prns using an action plan format for the latter.

## 2016-07-20 DIAGNOSIS — E669 Obesity, unspecified: Secondary | ICD-10-CM | POA: Insufficient documentation

## 2016-07-20 LAB — RESPIRATORY ALLERGY PROFILE REGION II ~~LOC~~
ALLERGEN, CEDAR TREE, T6: 0.22 kU/L — AB
ALLERGEN, COMM SILVER BIRCH, T3: 0.63 kU/L — AB
ALLERGEN, OAK, T7: 0.24 kU/L — AB
Allergen, A. alternata, m6: <0.1 kU/L
Allergen, C. Herbarum, M2: <0.1 kU/L
Allergen, Cottonwood, t14: 0.22 kU/L — ABNORMAL HIGH
Allergen, D pternoyssinus,d7: 20.7 kU/L — ABNORMAL HIGH
Allergen, Mouse Urine Protein, e78: <0.1 kU/L
Allergen, Mulberry, t76: 0.7 kU/L — ABNORMAL HIGH
Allergen, P. notatum, m1: <0.1 kU/L
Aspergillus fumigatus, m3: <0.1 kU/L
BOX ELDER: 0.22 kU/L — AB
Bermuda Grass: 0.21 kU/L — ABNORMAL HIGH
Cockroach: 0.61 kU/L — ABNORMAL HIGH
Common Ragweed: 1.26 kU/L — ABNORMAL HIGH
D. farinae: 20.2 kU/L — ABNORMAL HIGH
DOG DANDER: 0.18 kU/L — AB
ELM IGE: 0.47 kU/L — AB
IGE (IMMUNOGLOBULIN E), SERUM: 235 kU/L — AB (ref <115)
Johnson Grass: 0.21 kU/L — ABNORMAL HIGH
Pecan/Hickory Tree IgE: 2.13 kU/L — ABNORMAL HIGH
ROUGH PIGWEED IGE: 0.29 kU/L — AB
SHEEP SORREL IGE: 0.19 kU/L — AB
Timothy Grass: 0.21 kU/L — ABNORMAL HIGH

## 2016-07-20 NOTE — Assessment & Plan Note (Signed)
Body mass index is 44.55   Lab Results  Component Value Date   TSH 1.01 04/06/2011     Contributing to gerd tendency/ doe/reviewed the need and the process to achieve and maintain neg calorie balance > defer f/u primary care including intermittently monitoring thyroid status

## 2016-07-23 NOTE — Progress Notes (Signed)
LMTCB

## 2016-07-27 DIAGNOSIS — M2242 Chondromalacia patellae, left knee: Secondary | ICD-10-CM | POA: Diagnosis not present

## 2016-07-27 NOTE — Progress Notes (Signed)
LMTCB

## 2016-08-06 ENCOUNTER — Ambulatory Visit (INDEPENDENT_AMBULATORY_CARE_PROVIDER_SITE_OTHER): Payer: 59 | Admitting: Neurology

## 2016-08-06 ENCOUNTER — Encounter: Payer: Self-pay | Admitting: Neurology

## 2016-08-06 VITALS — BP 110/62 | HR 76 | Resp 16 | Ht 64.5 in | Wt 257.0 lb

## 2016-08-06 DIAGNOSIS — Z9884 Bariatric surgery status: Secondary | ICD-10-CM

## 2016-08-06 DIAGNOSIS — Z9989 Dependence on other enabling machines and devices: Secondary | ICD-10-CM | POA: Diagnosis not present

## 2016-08-06 DIAGNOSIS — G4726 Circadian rhythm sleep disorder, shift work type: Secondary | ICD-10-CM

## 2016-08-06 DIAGNOSIS — G4733 Obstructive sleep apnea (adult) (pediatric): Secondary | ICD-10-CM | POA: Diagnosis not present

## 2016-08-06 NOTE — Patient Instructions (Addendum)
Based on your symptoms and your exam I believe you are still at risk for obstructive sleep apnea or OSA, and I think we should proceed with a sleep study to determine whether you still have OSA and how severe it is. If you have more than mild OSA, I want you to consider treatment with CPAP. Please remember, the risks and ramifications of moderate to severe obstructive sleep apnea or OSA are: Cardiovascular disease, including congestive heart failure, stroke, difficult to control hypertension, arrhythmias, and even type 2 diabetes has been linked to untreated OSA. Sleep apnea causes disruption of sleep and sleep deprivation in most cases, which, in turn, can cause recurrent headaches, problems with memory, mood, concentration, focus, and vigilance. Most people with untreated sleep apnea report excessive daytime sleepiness, which can affect their ability to drive. Please do not drive if you feel sleepy.   I will likely see you back after your sleep study to go over the test results and where to go from there. We will call you after your sleep study to advise about the results (most likely, you will hear from Lafonda Mossesiana, my nurse) and to set up an appointment at the time, as necessary.    Our sleep lab administrative assistant, Alvis LemmingsDawn will meet with you or call you to schedule your sleep study. If you don't hear back from her by next week please feel free to call her at [phone]. This is her direct line and please leave a message with your phone number to call back if you get the voicemail box. She will call back as soon as possible.

## 2016-08-06 NOTE — Progress Notes (Signed)
Subjective:    Patient ID: Karina [name] is a 35 y.o. female.  HPI     Huston Foley, MD, PhD [facility] [address] [address] [place], [place] [address]  Dear Richardson Dopp,   I saw your patient, Karina [name], upon your kind request in my neurologic clinic today for initial consultation of her sleep disorder, in particular, reevaluation of her obstructive sleep apnea. The patient is unaccompanied today. As you know, [name] is a 35 year old right-handed woman with an underlying medical history of asthma, hemoglobin C trait, pre-diabetes and morbid obesity with lap band bariatric surgery in 2012, who was previously diagnosed with obstructive sleep apnea and placed on CPAP therapy. Sleep study was in or around 2008, prior study results are not available for my review today. She has been on CPAP. She has lost in the realm of 60 pounds since her bariatric surgery but has not had re-evaluation of her OSA and need for ongoing CPAP therapy. I reviewed your office note from 05/11/2016, which you kindly included. She is also status post bilateral carpal tunnel surgery and cholecystectomy. She has recently seen Dr. Sherene Sires in pulmonology on 06/18/2016 and I reviewed the note. She has a CPAP machine which is over 65 years old, but she did not bring her machine today, a compliance download is not available for my review today. Her Epworth sleepiness score is 5 out of 24 today, her fatigue score is 27 out of 63. She lives at home with her husband and her 19 yo daughter.  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 Comes home around 6:30, tries to be in bed by 7 AM, unless 35 yo is up. Sleeps usually till noon or 1 PM and may take a nap with daughter around 3 PM. She has been working this schedule since March 2017. She does report not always feeling rested. She has darkening curtains in her bedroom. She tries to  keep a good schedule, on nights that she is not working she is not able to fall asleep at night easily. Often she will stay awake until about 4 AM and then fall asleep. She sleeps until noon or 1 PM. [place] sleep [place] was [place] out of state, in [place]. Husbands works days. She had a miscarriage last year, is trying to get pregnant. They have 2 dogs. Does not watch TV in bedroom. Denies RLS symptoms, or leg kicking.  Denies AM HAs, but rare night time HAs at work, drinks about 2 cups of coffee while at work. No sodas, no smoking, rare alcohol.  She has a fairly strong family history of obstructive sleep apnea in her mother, maternal grandmother, brother, all on CPAP therapy, in addition she has maternal aunts and uncles with sleep apnea.  Her Past Medical History Is Significant For: Past Medical History:  Diagnosis Date  . Anemia    takes iron daily  . Asthma    albuterol as needed  . Chronic bronchitis   . Diabetes mellitus    takes metformin daily  . Edema extremities    hx of edema in lower ext. takes lasix PRN last dose few weeks ago  . Gallbladder disease   . GERD (gastroesophageal reflux disease)    nexium   . History of chicken pox   . Neuropathy (HCC)   . Pregnant   . Sleep apnea  cpap    Her Past Surgical History Is Significant For: Past Surgical History:  Procedure Laterality Date  . CARPAL TUNNEL RELEASE  2008, 2010  . CHOLECYSTECTOMY  01/11/2012   Procedure: LAPAROSCOPIC CHOLECYSTECTOMY WITH INTRAOPERATIVE CHOLANGIOGRAM;  Surgeon: Cherylynn RidgesJames O Wyatt, MD;  Location: [facility] OR;  Service: General;  Laterality: N/A;  . LAPAROSCOPIC GASTRIC BANDING    . NO PAST SURGERIES      Her Family History Is Significant For: Family History  Problem Relation Age of Onset  . Diabetes Sister   . Asthma Sister   . Heart disease Other   . Hyperlipidemia Other   . Hypertension Other   . Stroke Other   . Kidney disease Other   . Thyroid disease Other   . Ovarian cancer Maternal  Aunt   . Prostate cancer Maternal Uncle   . Cervical cancer Maternal Aunt     cervial and thyroid  . Liver cancer Paternal Grandfather   . Prostate cancer Paternal Grandfather   . Liver cancer Paternal Uncle   . Anesthesia problems Mother   . Asthma Maternal Grandmother   . Hypotension Neg Hx   . Malignant hyperthermia Neg Hx   . Pseudochol deficiency Neg Hx     Her Social History Is Significant For: Social History   Social History  . Marital status: Married    Spouse name: N/A  . Number of children: 1  . Years of education: college   Social History Main Topics  . Smoking status: Former Smoker    Packs/day: 0.25    Years: 1.00    Types: Cigars, Cigarettes    Quit date: 09/11/1995  . Smokeless tobacco: Never Used  . Alcohol use Yes     Comment: socially  . Drug use: No  . Sexual activity: Not Currently    Birth control/ protection: None     Comment: lives with husband and daughter, work as phlbeotomy   Other Topics Concern  . None   Social History Narrative   PCP-Dr. Deirdre PriestIra Goldwaisser in [place]   Drinks 1-2 cups of coffee a day     Her Allergies Are:  No Known Allergies:   Her Current Medications Are:  Outpatient Encounter Prescriptions as of 08/06/2016  Medication Sig  . albuterol (PROVENTIL HFA;VENTOLIN HFA) 108 (90 BASE) MCG/ACT inhaler Inhale 1 puff into the lungs every 6 (six) hours as needed for wheezing or shortness of breath.   . calcium carbonate (TUMS - DOSED IN MG ELEMENTAL CALCIUM) 500 MG chewable tablet Chew 1 tablet by mouth as needed for indigestion.   . metFORMIN (GLUCOPHAGE) 500 MG tablet Take 1,000 mg by mouth 2 (two) times daily with a meal.   . mometasone-formoterol (DULERA) 100-5 MCG/ACT AERO Inhale 2 puffs into the lungs 2 (two) times daily.  [name]. omeprazole (PRILOSEC) 40 MG capsule Take 40 mg by mouth daily.  . Prenatal Vit-Fe Fumarate-FA (MULTIVITAMIN-PRENATAL) 27-0.8 MG TABS tablet Take 1 tablet by mouth daily at 12 noon.  . Vitamin D,  Ergocalciferol, (DRISDOL) 50000 units CAPS capsule Take 50,000 Units by mouth every 7 (seven) days.   No facility-administered encounter medications on file as of 08/06/2016.   :  Review of Systems:  Out of a complete 14 point review of systems, all are reviewed and negative with the exception of these symptoms as listed below: Review of Systems  Neurological:       Patient [place] sleep [place]. She was placed on CPAP.  Still using CPAP.  Patient works 3rd shift, sleeps at night when not working, sometimes has trouble falling and staying asleep, snoring, witnessed apnea, wakes up feeling tired, headaches during day.   Epworth Sleepiness Scale 0= would never doze 1= slight chance of dozing 2= moderate chance of dozing 3= high chance of dozing  Sitting and reading:1 Watching TV:1 Sitting inactive in a public place (ex. Theater or meeting):0 As a passenger in a car for an hour without a break:0 Lying down to rest in the afternoon:2 Sitting and talking to someone:0 Sitting quietly after lunch (no alcohol):1 In a car, while stopped in traffic:0 Total:5   Objective:  Neurologic Exam  Physical Exam Physical Examination:   Vitals:   08/06/16 0855  BP: 110/62  Pulse: 76  Resp: 16    General Examination: The patient is a very pleasant 35 y.o. female in no acute distress. She appears well-developed and well-nourished and well groomed.   HEENT: Normocephalic, atraumatic, pupils are equal, round and reactive to light and accommodation. Funduscopic exam is normal with sharp disc margins noted. Extraocular tracking is good without limitation to gaze excursion or nystagmus noted. Normal smooth pursuit is noted. Hearing is grossly intact. Tympanic membranes are clear bilaterally. Face is symmetric with normal facial animation and normal facial sensation. Speech is clear with no dysarthria noted. There is no hypophonia. There is no lip, neck/head, jaw or voice  tremor. Neck is supple with full range of passive and active motion. There are no carotid bruits on auscultation. Oropharynx exam reveals: mild mouth dryness, adequate dental hygiene and mild airway crowding, due to wider tongue, tonsils in place of about 2+ in size bilaterally. Mallampati is class II. Tongue protrudes centrally and palate elevates symmetrically. Neck size is 14.5 inches. She has a Mild overbite.   Chest: Clear to auscultation without wheezing, rhonchi or crackles noted.  Heart: S1+S2+0, regular and normal without murmurs, rubs or gallops noted.   Abdomen: Soft, non-tender and non-distended with normal bowel sounds appreciated on auscultation.  Extremities: There is no pitting edema in the distal lower extremities bilaterally. Pedal pulses are intact.  Skin: Warm and dry without trophic changes noted. There are no varicose veins.  Musculoskeletal: exam reveals no obvious joint deformities, tenderness or joint swelling or erythema.   Neurologically:  Mental status: The patient is awake, alert and oriented in all 4 spheres. Her immediate and remote memory, attention, language skills and fund of knowledge are appropriate. There is no evidence of aphasia, agnosia, apraxia or anomia. Speech is clear with normal prosody and enunciation. Thought process is linear. Mood is normal and affect is normal.  Cranial nerves II - XII are as described above under HEENT exam. In addition: shoulder shrug is normal with equal shoulder height noted. Motor exam: Normal bulk, strength and tone is noted. There is no drift, tremor or rebound. Romberg is negative. Reflexes are 2+ throughout. Fine motor skills and coordination: intact with normal finger taps, normal hand movements, normal rapid alternating patting, normal foot taps and normal foot agility.  Cerebellar testing: No dysmetria or intention tremor on finger to nose testing. Heel to shin is unremarkable bilaterally. There is no truncal or gait  ataxia.  Sensory exam: intact to light touch, pinprick, vibration, temperature sense in the upper and lower extremities.  Gait, station and balance: She stands easily. No veering to one side is noted. No leaning to one side is noted. Posture is age-appropriate and stance is narrow based. Gait shows normal  stride length and normal pace. No problems turning are noted. Tandem walk is difficult in the beginning.                Assessment and Plan:  In summary, Karina PurlOlga Paula De [name] is a very pleasant 35 y.o.-year old female with an underlying medical history of asthma, hemoglobin C trait, pre-diabetes and morbid obesity with lap band bariatric surgery in 2012, who was previously diagnosed with obstructive sleep apnea and placed on CPAP therapy. She needs reevaluation of her diagnosis and adjustment of her treatment settings as she had bariatric surgery and has achieved significant weight loss.  I had a long chat with the patient about my findings and the diagnosis of OSA, its prognosis and treatment options. We talked about medical treatments, surgical interventions and non-pharmacological approaches. I explained in particular the risks and ramifications of untreated moderate to severe OSA, especially with respect to developing cardiovascular disease down the Road, including congestive heart failure, difficult to treat hypertension, cardiac arrhythmias, or stroke. Even type 2 diabetes has, in part, been linked to untreated OSA. Symptoms of untreated OSA include daytime sleepiness, memory problems, mood irritability and mood disorder such as depression and anxiety, lack of energy, as well as recurrent headaches, especially morning headaches. We talked about trying to maintain a healthy lifestyle in general, as well as the importance of weight control. I encouraged the patient to eat healthy, exercise daily and keep well hydrated, to keep a scheduled bedtime and wake time routine, to not skip any meals and eat healthy  snacks in between meals. I advised the patient not to drive when feeling sleepy. I recommended the following at this time: sleep study with potential positive airway pressure titration. (We will score hypopneas at 3% and split the sleep study into diagnostic and treatment portion, if the estimated. 2 hour AHI is >15/h).   I explained the sleep test procedure to the patient and also outlined possible surgical and non-surgical treatment options of OSA, including the use of a custom-made dental device (which would require a referral to a specialist dentist or oral surgeon), upper airway surgical options, such as pillar implants, radiofrequency surgery, tongue base surgery, and UPPP (which would involve a referral to an ENT surgeon). Rarely, jaw surgery such as mandibular advancement may be considered.  I also explained the CPAP treatment option to the patient, who indicated that she would be willing to continue to use CPAP if the need arises. I explained the importance of being compliant with PAP treatment, not only for insurance purposes but primarily to improve Her symptoms, and for the patient's long term health benefit, including to reduce Her cardiovascular risks. I answered all her questions today and the patient was in agreement. I would like to see her back after the sleep study is completed and encouraged her to call with any interim questions, concerns, problems or updates.   Thank you very much for allowing me to participate in the care of this nice patient. If I can be of any further assistance to you please do not hesitate to call me at [phone].  Sincerely,   Huston FoleySaima Liam Bossman, MD, PhD

## 2016-08-20 DIAGNOSIS — N930 Postcoital and contact bleeding: Secondary | ICD-10-CM | POA: Diagnosis not present

## 2016-08-20 DIAGNOSIS — E559 Vitamin D deficiency, unspecified: Secondary | ICD-10-CM | POA: Diagnosis not present

## 2016-08-20 DIAGNOSIS — Z8349 Family history of other endocrine, nutritional and metabolic diseases: Secondary | ICD-10-CM | POA: Diagnosis not present

## 2016-08-20 DIAGNOSIS — Z3040 Encounter for surveillance of contraceptives, unspecified: Secondary | ICD-10-CM | POA: Diagnosis not present

## 2016-08-20 DIAGNOSIS — E282 Polycystic ovarian syndrome: Secondary | ICD-10-CM | POA: Diagnosis not present

## 2016-08-20 DIAGNOSIS — Z01419 Encounter for gynecological examination (general) (routine) without abnormal findings: Secondary | ICD-10-CM | POA: Diagnosis not present

## 2016-08-20 DIAGNOSIS — D509 Iron deficiency anemia, unspecified: Secondary | ICD-10-CM | POA: Diagnosis not present

## 2016-08-20 DIAGNOSIS — N971 Female infertility of tubal origin: Secondary | ICD-10-CM | POA: Diagnosis not present

## 2016-08-20 DIAGNOSIS — Z6841 Body Mass Index (BMI) 40.0 and over, adult: Secondary | ICD-10-CM | POA: Diagnosis not present

## 2016-08-20 DIAGNOSIS — Z113 Encounter for screening for infections with a predominantly sexual mode of transmission: Secondary | ICD-10-CM | POA: Diagnosis not present

## 2016-08-30 ENCOUNTER — Ambulatory Visit (INDEPENDENT_AMBULATORY_CARE_PROVIDER_SITE_OTHER): Payer: 59 | Admitting: Neurology

## 2016-08-30 DIAGNOSIS — G4733 Obstructive sleep apnea (adult) (pediatric): Secondary | ICD-10-CM | POA: Diagnosis not present

## 2016-08-30 DIAGNOSIS — G4720 Circadian rhythm sleep disorder, unspecified type: Secondary | ICD-10-CM

## 2016-08-30 DIAGNOSIS — R0683 Snoring: Secondary | ICD-10-CM

## 2016-09-05 NOTE — Procedures (Signed)
PATIENT'S NAME:  Karina [name], Olga DOB:      July 23, 1981      MR#:    [id number]     DATE OF RECORDING: 08/30/2016 REFERRING M.D.:  Gardiner Fantiole Podraza, NP Study Performed:   Baseline Polysomnogram HISTORY:  35 year old woman with a history of asthma, hemoglobin C trait, pre-diabetes and morbid obesity s/p lap band bariatric surgery in 2012, who was previously diagnosed with obstructive sleep apnea and placed on CPAP therapy. Sleep study was in or around 2008, prior study results are not available for my review today. She has lost in the realm of 60 pounds since her bariatric surgery but has not had re-evaluation of her OSA and need for ongoing CPAP therapy. The patient endorsed the Epworth Sleepiness Scale at 5 points. The patient's weight 258 pounds with a height of 64 (inches), resulting in a BMI of 44. kg/m2. The patient's neck circumference measured 14 inches.  CURRENT MEDICATIONS: Albuterol inhaler, Metformin, Dulera, Prilosec, Prenatal, Vitamin D   PROCEDURE:  This is a multichannel digital polysomnogram utilizing the Somnostar 11.2 system.  Electrodes and sensors were applied and monitored per AASM Specifications.   EEG, EOG, Chin and Limb EMG, were sampled at 200 Hz.  ECG, Snore and Nasal Pressure, Thermal Airflow, Respiratory Effort, CPAP Flow and Pressure, Oximetry was sampled at 50 Hz. Digital video and audio were recorded.      BASELINE STUDY  Lights Out was at 22:31 and Lights On at 04:45.  Total recording time (TRT) was 375 minutes, with a total sleep time (TST) of  360 minutes.   The patient's sleep latency was 3 minutes.  REM latency was 104.5 minutes, which is normal.  The sleep efficiency was 96%.     SLEEP ARCHITECTURE: WASO (Wake after sleep onset) was 12 minutes.  There were 8.5 minutes in Stage N1, 242 minutes Stage N2, 60.5 minutes Stage N3 and 49 minutes in Stage REM.  The percentage of Stage N1 was 2.4%, Stage N2 was 67.2%, which is mildly elevated, Stage N3 was 16.8%, which is  normal and Stage R (REM sleep) was 13.6%, which is mildly reduced.   The arousals were noted as: 28 were spontaneous, 0 were associated with PLMs, 0 were associated with respiratory events.   Audio and video analysis did not show any abnormal or unusual movements, behaviors, phonations or vocalizations.  The patient took no bathroom breaks. Intermittent mild to moderate snoring was noted. The EKG was in keeping with normal sinus rhythm (NSR).  RESPIRATORY ANALYSIS:  There were a total of 1 respiratory events:  0 obstructive apneas, 0 central apneas and 0 mixed apneas with a total of 0 apneas and an apnea index (AI) of 0 /hour. There were 1 hypopneas with a hypopnea index of .2 /hour. The patient also had 0 respiratory event related arousals (RERAs).      The total APNEA/HYPOPNEA INDEX (AHI) was .2/hour and the total RESPIRATORY DISTURBANCE INDEX was .2 /hour.  0 events occurred in REM sleep and 2 events in NREM. The REM AHI was 0 /hour, versus a non-REM AHI of .2. The patient spent 85 minutes of total sleep time in the supine position and 275 minutes in non-supine.. The supine AHI was 0.0 versus a non-supine AHI of 0.2.  OXYGEN SATURATION & C02:  The Wake baseline 02 saturation was 98%, with the lowest being 94%. Time spent below 89% saturation equaled 0 minutes.  PERIODIC LIMB MOVEMENTS:   The patient had a total of 0  Periodic Limb Movements.  The Periodic Limb Movement (PLM) index was 0 and the PLM Arousal index was 0/hour.    Post-study, the patient indicated that sleep was the same as usual.   IMPRESSION:  1. Dysfunctions associated with sleep stages or arousal from sleep 2. Primary Snoring   RECOMMENDATIONS:  1. This study does not demonstrate any significant obstructive or central sleep disordered breathing, with the exception of intermittent snoring. For this CPAP therapy is not warranted. For disturbing snoring, an oral appliance can be considered and further weight loss as well as  the avoidance of the supine sleep position may help.  2. This study shows sleep fragmentation and abnormal sleep stage percentages; these are nonspecific findings and per se do not signify an intrinsic sleep disorder or a cause for the patient's sleep-related symptoms. Causes include (but are not limited to) the first night effect of the sleep study, circadian rhythm disturbances, medication effect or an underlying mood disorder or medical problem.  3. The patient will be seen in follow-up by [facility]Athar at [facility] for discussion of the test results and further management strategies. The referring provider will be notified of the test results.    I certify that I have reviewed the entire raw data recording prior to the issuance of this report in accordance with the Standards of Accreditation of the American Academy of Sleep Medicine (AASM)    Huston FoleySaima Breyana Follansbee, MD, PhD Diplomat, American Board of Psychiatry and Neurology (Neurology and Sleep Medicine)

## 2016-09-05 NOTE — Progress Notes (Signed)
Patient referred by Gardiner Fantiole Podraza, seen by me on 08/06/16, diagnostic PSG on 08/30/16.   Please call and notify the patient that the recent sleep study did not show any significant obstructive sleep apnea or leg twitching, mild to moderate intermittent snoring, good O2 sats throughout. Please inform patient that we can go over the details of the study during a follow up appointment or also play it by ear. Slept 96% of the time we studied her. Arrange a followup appointment if desired. Also, route or fax report to PCP and referring MD, if other than PCP.  Once you have spoken to patient, you can close this encounter.   Thanks,  Huston FoleySaima Stephanne Greeley, MD, PhD [facility])

## 2016-09-06 ENCOUNTER — Telehealth: Payer: Self-pay

## 2016-09-06 NOTE — Telephone Encounter (Signed)
-----   Message from Huston FoleySaima Athar, MD sent at 09/05/2016  9:52 AM EST ----- Patient referred by Gardiner Fantiole Podraza, seen by me on 08/06/16, diagnostic PSG on 08/30/16.   Please call and notify the patient that the recent sleep study did not show any significant obstructive sleep apnea or leg twitching, mild to moderate intermittent snoring, good O2 sats throughout. Please inform patient that we can go over the details of the study during a follow up appointment or also play it by ear. Slept 96% of the time we studied her. Arrange a followup appointment if desired. Also, route or fax report to PCP and referring MD, if other than PCP.  Once you have spoken to patient, you can close this encounter.   Thanks,  Huston FoleySaima Athar, MD, PhD [facility])

## 2016-09-06 NOTE — Telephone Encounter (Signed)
I spoke to Karina [name] and advised her that her recent sleep study did not show any significant sleep apnea or leg twitching, but did reveal mild to moderate snoring with good O2 sats. I offered Karina [name] an appt with Dr. Frances FurbishAthar to go over the details of the study during a follow up appt or play it by ear. Karina [name] says that she will stop using her cpap and call us back if she decides that she wants a follow up appt with Dr. Frances FurbishAthar. Karina [name] asked that I send her sleep study results to Suzan Slickole Padraza, PA. Karina [name] verbalized understanding of results. Karina [name] had no questions at this time but was encouraged to call back if questions arise.

## 2016-09-09 ENCOUNTER — Telehealth: Payer: 59 | Admitting: Nurse Practitioner

## 2016-09-09 DIAGNOSIS — M5442 Lumbago with sciatica, left side: Secondary | ICD-10-CM | POA: Diagnosis not present

## 2016-09-09 MED ORDER — NAPROXEN 500 MG PO TABS: 500.0000 mg | ORAL_TABLET | Freq: Two times a day (BID) | ORAL | 1 refills | Status: DC

## 2016-09-09 MED ORDER — BACLOFEN 10 MG PO TABS: 10.0000 mg | ORAL_TABLET | Freq: Three times a day (TID) | ORAL | 0 refills | Status: DC

## 2016-09-09 NOTE — Progress Notes (Signed)

## 2016-09-10 NOTE — L&D Delivery Note (Signed)
Delivery Note At 10:34 PM, on October 10th, 2018, a viable female "Karina [name]" was delivered via Vaginal, Spontaneous Delivery (Presentation: Right Occiput Anterior with restitution to ROT). Shoulders delivered easily and infant with good tone and spontaneous cry. Tactile stimulation given by provider and infant placed on mother's abdomen where nurse continued tactile stimulation.  Infant  APGAR: 9;9.  Cord clamped, cut, and blood collected. Placenta delivered spontaneously and noted to be intact with 3VC upon inspection.  Vaginal inspection revealed no lacerations, but a small hemostatic right side labial abrasion was noted.  Fundus firm, at the umbilicus, and bleeding small.  Mother hemodynamically stable and infant skin to skin prior to provider exit.  Mother does not desire birth control and opts to breastfeed.  Infant weight at one hour of life: 6lbs 8oz, 19in  Anesthesia:  Epidural Episiotomy:  None Lacerations: None-Small Right Side Labial Abrasion Hemostatic and Unrepaired Suture Repair: None Est. Blood Loss (mL):  200  Mom to postpartum.  Baby to Couplet care / Skin to Skin.  Cherre Robins MSN, CNM 06/19/2017, 10:49 PM

## 2016-09-14 ENCOUNTER — Telehealth: Payer: 59 | Admitting: Family

## 2016-09-14 DIAGNOSIS — R05 Cough: Secondary | ICD-10-CM

## 2016-09-14 DIAGNOSIS — B9789 Other viral agents as the cause of diseases classified elsewhere: Secondary | ICD-10-CM

## 2016-09-14 DIAGNOSIS — J069 Acute upper respiratory infection, unspecified: Secondary | ICD-10-CM | POA: Diagnosis not present

## 2016-09-14 DIAGNOSIS — R059 Cough, unspecified: Secondary | ICD-10-CM

## 2016-09-14 MED ORDER — BENZONATATE 100 MG PO CAPS: 100.0000 mg | ORAL_CAPSULE | Freq: Two times a day (BID) | ORAL | 0 refills | Status: DC | PRN

## 2016-09-14 NOTE — Progress Notes (Signed)
We are sorry that you are not feeling well.  Here is how we plan to help!  Based on what you have shared with me it looks like you have upper respiratory tract inflammation that has resulted in a significant cough.  Inflammation and infection in the upper respiratory tract is commonly called bronchitis and has four common causes:  Allergies, Viral Infections, Acid Reflux and Bacterial Infections.  Allergies, viruses and acid reflux are treated by controlling symptoms or eliminating the cause. An example might be a cough caused by taking certain blood pressure medications. You stop the cough by changing the medication. Another example might be a cough caused by acid reflux. Controlling the reflux helps control the cough.  Based on your presentation I believe you most likely have A cough due to a virus.  This is called viral bronchitis and is best treated by rest, plenty of fluids and control of the cough.  You may use Ibuprofen or Tylenol as directed to help your symptoms.     In addition you may use A non-prescription cough medication called Robitussin DAC. Take 2 teaspoons every 8 hours or Delsym: take 2 teaspoons every 12 hours., A non-prescription cough medication called Mucinex DM: take 2 tablets every 12 hours. and A prescription cough medication called Tessalon Perles 100mg. You may take 1-2 capsules every 8 hours as needed for your cough.   USE OF BRONCHODILATOR ("RESCUE") INHALERS: There is a risk from using your bronchodilator too frequently.  The risk is that over-reliance on a medication which only relaxes the muscles surrounding the breathing tubes can reduce the effectiveness of medications prescribed to reduce swelling and congestion of the tubes themselves.  Although you feel brief relief from the bronchodilator inhaler, your asthma may actually be worsening with the tubes becoming more swollen and filled with mucus.  This can delay other crucial treatments, such as oral steroid medications.  If you need to use a bronchodilator inhaler daily, several times per day, you should discuss this with your provider.  There are probably better treatments that could be used to keep your asthma under control.     HOME CARE . Only take medications as instructed by your medical team. . Complete the entire course of an antibiotic. . Drink plenty of fluids and get plenty of rest. . Avoid close contacts especially the very young and the elderly . Cover your mouth if you cough or cough into your sleeve. . Always remember to wash your hands . A steam or ultrasonic humidifier can help congestion.   GET HELP RIGHT AWAY IF: . You develop worsening fever. . You become short of breath . You cough up blood. . Your symptoms persist after you have completed your treatment plan MAKE SURE YOU   Understand these instructions.  Will watch your condition.  Will get help right away if you are not doing well or get worse.  Your e-visit answers were reviewed by a board certified advanced clinical practitioner to complete your personal care plan.  Depending on the condition, your plan could have included both over the counter or prescription medications. If there is a problem please reply  once you have received a response from your provider. Your safety is important to us.  If you have drug allergies check your prescription carefully.    You can use MyChart to ask questions about today's visit, request a non-urgent call back, or ask for a work or school excuse for 24 hours related to this e-Visit.   If it has been greater than 24 hours you will need to follow up with your provider, or enter a new e-Visit to address those concerns. You will get an e-mail in the next two days asking about your experience.  I hope that your e-visit has been valuable and will speed your recovery. Thank you for using e-visits.   

## 2016-09-20 DIAGNOSIS — Z6841 Body Mass Index (BMI) 40.0 and over, adult: Secondary | ICD-10-CM | POA: Diagnosis not present

## 2016-09-20 DIAGNOSIS — N979 Female infertility, unspecified: Secondary | ICD-10-CM | POA: Diagnosis not present

## 2016-09-20 DIAGNOSIS — N930 Postcoital and contact bleeding: Secondary | ICD-10-CM | POA: Diagnosis not present

## 2016-09-20 DIAGNOSIS — E559 Vitamin D deficiency, unspecified: Secondary | ICD-10-CM | POA: Diagnosis not present

## 2016-10-19 ENCOUNTER — Ambulatory Visit: Payer: 59 | Admitting: Internal Medicine

## 2016-10-24 DIAGNOSIS — E559 Vitamin D deficiency, unspecified: Secondary | ICD-10-CM | POA: Diagnosis not present

## 2016-10-26 ENCOUNTER — Inpatient Hospital Stay (HOSPITAL_COMMUNITY)
Admission: AD | Admit: 2016-10-26 | Discharge: 2016-10-26 | Disposition: A | Discharge status: Home / Self Care | Payer: 59 | Source: Ambulatory Visit | Attending: Obstetrics and Gynecology | Admitting: Obstetrics and Gynecology

## 2016-10-26 ENCOUNTER — Other Ambulatory Visit: Payer: Self-pay | Admitting: Obstetrics and Gynecology

## 2016-10-26 ENCOUNTER — Inpatient Hospital Stay (HOSPITAL_COMMUNITY): Payer: 59

## 2016-10-26 ENCOUNTER — Encounter (HOSPITAL_COMMUNITY): Payer: Self-pay

## 2016-10-26 DIAGNOSIS — E559 Vitamin D deficiency, unspecified: Secondary | ICD-10-CM

## 2016-10-26 DIAGNOSIS — D582 Other hemoglobinopathies: Secondary | ICD-10-CM

## 2016-10-26 DIAGNOSIS — Z87891 Personal history of nicotine dependence: Secondary | ICD-10-CM | POA: Diagnosis not present

## 2016-10-26 DIAGNOSIS — K219 Gastro-esophageal reflux disease without esophagitis: Secondary | ICD-10-CM | POA: Insufficient documentation

## 2016-10-26 DIAGNOSIS — O99519 Diseases of the respiratory system complicating pregnancy, unspecified trimester: Secondary | ICD-10-CM | POA: Insufficient documentation

## 2016-10-26 DIAGNOSIS — G4730 Sleep apnea, unspecified: Secondary | ICD-10-CM | POA: Diagnosis not present

## 2016-10-26 DIAGNOSIS — K828 Other specified diseases of gallbladder: Secondary | ICD-10-CM

## 2016-10-26 DIAGNOSIS — J45909 Unspecified asthma, uncomplicated: Secondary | ICD-10-CM | POA: Diagnosis not present

## 2016-10-26 DIAGNOSIS — F419 Anxiety disorder, unspecified: Secondary | ICD-10-CM

## 2016-10-26 DIAGNOSIS — Z833 Family history of diabetes mellitus: Secondary | ICD-10-CM | POA: Diagnosis not present

## 2016-10-26 DIAGNOSIS — O99280 Endocrine, nutritional and metabolic diseases complicating pregnancy, unspecified trimester: Secondary | ICD-10-CM | POA: Insufficient documentation

## 2016-10-26 DIAGNOSIS — Z825 Family history of asthma and other chronic lower respiratory diseases: Secondary | ICD-10-CM | POA: Insufficient documentation

## 2016-10-26 DIAGNOSIS — Z3A00 Weeks of gestation of pregnancy not specified: Secondary | ICD-10-CM | POA: Insufficient documentation

## 2016-10-26 DIAGNOSIS — Z79899 Other long term (current) drug therapy: Secondary | ICD-10-CM | POA: Insufficient documentation

## 2016-10-26 DIAGNOSIS — O26899 Other specified pregnancy related conditions, unspecified trimester: Secondary | ICD-10-CM

## 2016-10-26 DIAGNOSIS — R109 Unspecified abdominal pain: Secondary | ICD-10-CM | POA: Insufficient documentation

## 2016-10-26 DIAGNOSIS — Z7984 Long term (current) use of oral hypoglycemic drugs: Secondary | ICD-10-CM | POA: Insufficient documentation

## 2016-10-26 DIAGNOSIS — E282 Polycystic ovarian syndrome: Secondary | ICD-10-CM | POA: Diagnosis not present

## 2016-10-26 DIAGNOSIS — N971 Female infertility of tubal origin: Secondary | ICD-10-CM | POA: Diagnosis not present

## 2016-10-26 DIAGNOSIS — O99350 Diseases of the nervous system complicating pregnancy, unspecified trimester: Secondary | ICD-10-CM | POA: Insufficient documentation

## 2016-10-26 DIAGNOSIS — Z9884 Bariatric surgery status: Secondary | ICD-10-CM

## 2016-10-26 DIAGNOSIS — E860 Dehydration: Secondary | ICD-10-CM | POA: Diagnosis not present

## 2016-10-26 DIAGNOSIS — O99619 Diseases of the digestive system complicating pregnancy, unspecified trimester: Secondary | ICD-10-CM | POA: Insufficient documentation

## 2016-10-26 DIAGNOSIS — A6000 Herpesviral infection of urogenital system, unspecified: Secondary | ICD-10-CM

## 2016-10-26 DIAGNOSIS — Z8639 Personal history of other endocrine, nutritional and metabolic disease: Secondary | ICD-10-CM

## 2016-10-26 DIAGNOSIS — O26891 Other specified pregnancy related conditions, first trimester: Secondary | ICD-10-CM | POA: Diagnosis not present

## 2016-10-26 DIAGNOSIS — Z8719 Personal history of other diseases of the digestive system: Secondary | ICD-10-CM

## 2016-10-26 DIAGNOSIS — J4520 Mild intermittent asthma, uncomplicated: Secondary | ICD-10-CM

## 2016-10-26 DIAGNOSIS — Z3481 Encounter for supervision of other normal pregnancy, first trimester: Secondary | ICD-10-CM | POA: Diagnosis not present

## 2016-10-26 DIAGNOSIS — Z3491 Encounter for supervision of normal pregnancy, unspecified, first trimester: Secondary | ICD-10-CM

## 2016-10-26 DIAGNOSIS — E519 Thiamine deficiency, unspecified: Secondary | ICD-10-CM

## 2016-10-26 DIAGNOSIS — Z3A01 Less than 8 weeks gestation of pregnancy: Secondary | ICD-10-CM | POA: Diagnosis not present

## 2016-10-26 DIAGNOSIS — R102 Pelvic and perineal pain: Secondary | ICD-10-CM | POA: Diagnosis not present

## 2016-10-26 DIAGNOSIS — D508 Other iron deficiency anemias: Secondary | ICD-10-CM

## 2016-10-26 DIAGNOSIS — E538 Deficiency of other specified B group vitamins: Secondary | ICD-10-CM

## 2016-10-26 LAB — URINALYSIS, ROUTINE W REFLEX MICROSCOPIC
BILIRUBIN URINE: NEGATIVE
GLUCOSE, UA: NEGATIVE mg/dL
HGB URINE DIPSTICK: NEGATIVE
Ketones, ur: NEGATIVE mg/dL
NITRITE: NEGATIVE
PH: 5 (ref 5.0–8.0)
Protein, ur: NEGATIVE mg/dL
SPECIFIC GRAVITY, URINE: 1.021 (ref 1.005–1.030)

## 2016-10-26 LAB — HCG, QUANTITATIVE, PREGNANCY: hCG, Beta Chain, Quant, S: 16875 m[IU]/mL — ABNORMAL HIGH (ref <5)

## 2016-10-26 MED ORDER — LACTATED RINGERS IV SOLN: INTRAVENOUS | Status: DC

## 2016-10-26 MED ORDER — LACTATED RINGERS IV BOLUS (SEPSIS)
500.0000 mL | Freq: Once | INTRAVENOUS | Status: AC
  Administered 2016-10-26: 500 mL via INTRAVENOUS

## 2016-10-26 NOTE — Discharge Instructions (Signed)
Abdominal Pain During Pregnancy  Abdominal pain is common in pregnancy. Most of the time, it does not cause harm. There are many causes of abdominal pain. Some causes are more serious than others and sometimes the cause is not known. Abdominal pain can be a sign that something is very wrong with the pregnancy or the pain may have nothing to do with the pregnancy. Always tell your health care provider if you have any abdominal pain.  Follow these instructions at home:  · Do not have sex or put anything in your vagina until your symptoms go away completely.  · Watch your abdominal pain for any changes.  · Get plenty of rest until your pain improves.  · Drink enough fluid to keep your urine clear or pale yellow.  · Take over-the-counter or prescription medicines only as told by your health care provider.  · Keep all follow-up visits as told by your health care provider. This is important.  Contact a health care provider if:  · You have a fever.  · Your pain gets worse or you have cramping.  · Your pain continues after resting.  Get help right away if:  · You are bleeding, leaking fluid, or passing tissue from the vagina.  · You have vomiting or diarrhea that does not go away.  · You have painful or bloody urination.  · You notice a decrease in your baby's movements.  · You feel very weak or faint.  · You have shortness of breath.  · You develop a severe headache with abdominal pain.  · You have abnormal vaginal discharge with abdominal pain.  This information is not intended to replace advice given to you by your health care provider. Make sure you discuss any questions you have with your health care provider.  Document Released: 08/27/2005 Document Revised: 06/07/2016 Document Reviewed: 03/26/2013  Elsevier Interactive Patient Education © 2017 [employer]

## 2016-10-26 NOTE — MAU Note (Signed)
Pt to MAU with complaints of severe nausea, abdominal cramping, leg cramps and feeling of dehydration. Called her CNM this am and was sent here for fluids.

## 2016-10-26 NOTE — MAU Provider Note (Signed)
History    36 yo G2P1011 with LMP 09/19/16, hx + home UPT, presented for IV hydration, QHCG, and serum progesterone.  She had an unexpected positive UPT last week and has felt dehydrated due to inability to tolerate much fluid intake, due to hx of gastric bypass surgery 2014.  Denies vomiting or persistent nausea.  She reports mild cramping, no bleeding.  She had been 18 months trying to conceive ( sab 01/2014).    She is tolerating po fluids without difficulty.  Had hysteroscopy on 10/251/7, with right hysterosalpinx noted, right tube without spill, left tube patent.  She was last seen at [facility] on 09/20/16, for f/u on right hydrosalpinx--US noted right ovarian cyst, 4.1 x 3.4 x 3.8, likely hemorrhagic.  Plan initiated for [facility] right salpingectomy.  After that visit, patient began to have nausea, "feeling bad", and had a positive UPT.  Hx remarkable for: PCOS with insulin resistance: proven ovulation on Metformin 1000 BID and Femara 5 mg Right tubal blockage History of 2 spontaneous conceptions in the last 2 years Vit D deficiency--27 on last check 08/2016   Patient Active Problem List   Diagnosis Date Noted  . PCOS (polycystic ovarian syndrome) 10/26/2016  . History of insulin resistance 10/26/2016  . Sleep apnea 10/26/2016  . Mild intermittent asthma 07/19/2016  . Iron deficiency anemia 03/23/2016  . Vitamin D deficiency 03/23/2016  . Vitamin B12 deficiency 03/23/2016  . 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 Hemoglobin C (Hb-C) (HCC) 11/13/2013  . Thiamin deficiency 11/13/2013  . Asthma 11/09/2013  . Anxiety 02/20/2013  . H/O gastrointestinal disease 02/20/2013  . H/O laparoscopic adjustable gastric banding 02/20/2013  . Biliary dyskinesia 12/06/2011    Chief Complaint  Patient presents with  . Morning Sickness  . Abdominal Cramping   HPI:  As above  OB History    Gravida Para Term Preterm AB Living   3 1 1   1      SAB TAB Ectopic Multiple Live Births   1       1    2015--SVB, 38  1/7 weeks, induced for oligohydramnios, female, 6+8, epidural, delivered with [facility] 2016--SAB, 1st trimester  Past Medical History:  Diagnosis Date  . Anemia    takes iron daily  . Asthma    albuterol as needed  . Chronic bronchitis   . Diabetes mellitus    takes metformin daily  . Edema extremities    hx of edema in lower ext. takes lasix PRN last dose few weeks ago  . Gallbladder disease   . GERD (gastroesophageal reflux disease)    nexium   . History of chicken pox   . Neuropathy (HCC)   . Pregnant   . Sleep apnea    cpap    Past Surgical History:  Procedure Laterality Date  . CARPAL TUNNEL RELEASE  2008, 2010  . CHOLECYSTECTOMY  01/11/2012   Procedure: LAPAROSCOPIC CHOLECYSTECTOMY WITH INTRAOPERATIVE CHOLANGIOGRAM;  Surgeon: Cherylynn Ridges, MD;  Location: [facility] OR;  Service: General;  Laterality: N/A;  . LAPAROSCOPIC GASTRIC BANDING    . NO PAST SURGERIES      Family History  Problem Relation Age of Onset  . Diabetes Sister   . Asthma Sister   . Heart disease Other   . Hyperlipidemia Other   . Hypertension Other   . Stroke Other   . Kidney disease Other   . Thyroid disease Other   . Ovarian cancer Maternal Aunt   . Prostate cancer Maternal Uncle   .  Cervical cancer Maternal Aunt     cervial and thyroid  . Liver cancer Paternal Grandfather   . Prostate cancer Paternal Grandfather   . Liver cancer Paternal Uncle   . Anesthesia problems Mother   . Asthma Maternal Grandmother   . Hypotension Neg Hx   . Malignant hyperthermia Neg Hx   . Pseudochol deficiency Neg Hx     Social History  Substance Use Topics  . Smoking status: Former Smoker    Packs/day: 0.25    Years: 1.00    Types: Cigars, Cigarettes    Quit date: 09/11/1995  . Smokeless tobacco: Never Used  . Alcohol use Yes     Comment: socially    Allergies: No Known Allergies  Prescriptions Prior to Admission  Medication Sig Dispense Refill Last Dose  . calcium carbonate (TUMS - DOSED IN MG ELEMENTAL  CALCIUM) 500 MG chewable tablet Chew 1 tablet by mouth as needed for indigestion.    10/26/2016 at Unknown time  . metFORMIN (GLUCOPHAGE) 500 MG tablet Take 1,000 mg by mouth 2 (two) times daily with a meal.    10/25/2016 at Unknown time  . Prenatal Vit-Fe Fumarate-FA (MULTIVITAMIN-PRENATAL) 27-0.8 MG TABS tablet Take 1 tablet by mouth daily at 12 noon.   10/26/2016 at Unknown time  . albuterol (PROVENTIL HFA;VENTOLIN HFA) 108 (90 BASE) MCG/ACT inhaler Inhale 1 puff into the lungs every 6 (six) hours as needed for wheezing or shortness of breath.    10/12/2016  . baclofen (LIORESAL) 10 MG tablet Take 1 tablet (10 mg total) by mouth 3 (three) times daily. 30 each 0 09/29/2016  . benzonatate (TESSALON) 100 MG capsule Take 1 capsule (100 mg total) by mouth 2 (two) times daily as needed for cough. (Patient not taking: Reported on 10/26/2016) 20 capsule 0 Not Taking at Unknown time  . mometasone-formoterol (DULERA) 100-5 MCG/ACT AERO Inhale 2 puffs into the lungs 2 (two) times daily. (Patient not taking: Reported on 10/26/2016) 1 Inhaler 11 Not Taking at Unknown time  . naproxen (NAPROSYN) 500 MG tablet Take 1 tablet (500 mg total) by mouth 2 (two) times daily with a meal. (Patient not taking: Reported on 10/26/2016) 30 tablet 1 Not Taking at Unknown time  . omeprazole (PRILOSEC) 40 MG capsule Take 40 mg by mouth daily.   10/22/2016    ROS:  Mild cramping, dry lips Physical Exam   Blood pressure 116/68, pulse 97, temperature 98.3 F (36.8 C), temperature source Oral, resp. rate 18, height 5\' 4"  (1.626 m), weight 120.2 kg (265 lb), last menstrual period 09/19/2016, SpO2 99 %.    Physical Exam  Chest clear Heart RRR without murmur Abd soft, NT Pelvic--deferred, no d/c or bleeding Ext WNL  Results for orders placed or performed during the hospital encounter of 10/26/16 (from the past 24 hour(s))  Urinalysis, Routine w reflex microscopic     Status: Abnormal   Collection Time: 10/26/16 10:55 AM  Result  Value Ref Range   Color, Urine YELLOW YELLOW   APPearance CLEAR CLEAR   Specific Gravity, Urine 1.021 1.005 - 1.030   pH 5.0 5.0 - 8.0   Glucose, UA NEGATIVE NEGATIVE mg/dL   Hgb urine dipstick NEGATIVE NEGATIVE   Bilirubin Urine NEGATIVE NEGATIVE   Ketones, ur NEGATIVE NEGATIVE mg/dL   Protein, ur NEGATIVE NEGATIVE mg/dL   Nitrite NEGATIVE NEGATIVE   Leukocytes, UA TRACE (A) NEGATIVE   RBC / HPF 0-5 0 - 5 RBC/hpf   WBC, UA 0-5 0 - 5 WBC/hpf  Bacteria, UA RARE (A) NONE SEEN   Squamous Epithelial / LPF 0-5 (A) NONE SEEN   Mucous PRESENT     ED Course  Assessment: Early pregnancy Hx tubal infertility Hx bariatric surgery  Plan: Hydrate with 1 bag IVF QHCG, serum progesterone (per Dr. Cloretta Nedivard's request) [facility].   Nigel BridgemanLATHAM, Laniyah Rosenwald CNM, MSN 10/26/2016 12:33 PM   Addendum:  Jiles CrockerQHCG [phone].  Progesterone level still pending at 5:30p (drawn at 11:41a, [facility]  US per consult with Dr. Normand Sloopillard:  Rochele PagesIUGS, + YS, no FP, measuring 5 5/7 weeks, with fluid filled tubular structure in right adnexa measuring 4.2 x 3.7 x 3.8.  Received 1 bag IVF, "feeling better".  Plan: D/C home with routine pregnancy instructions. Will keep NOB interview scheduled 11/02/16--has NOB w/u scheduled with me on 3/19  Recommend f/u visit with me week of 3/5 for f/u US and recheck of status.  Note to office to schedule that visit, and to call patient with those arrangements. To f/u prn with any issues. Continue to work on adequate fluid intake. Office staff to f/u on progesterone level.  Nigel BridgemanVicki Dessie Delcarlo, CNM 10/26/16

## 2016-10-27 LAB — PROGESTERONE: PROGESTERONE: 8.8 ng/mL

## 2016-11-02 DIAGNOSIS — Z3491 Encounter for supervision of normal pregnancy, unspecified, first trimester: Secondary | ICD-10-CM | POA: Diagnosis not present

## 2016-11-02 LAB — OB RESULTS CONSOLE GBS: STREP GROUP B AG: POSITIVE

## 2016-11-02 LAB — OB RESULTS CONSOLE GC/CHLAMYDIA
CHLAMYDIA, DNA PROBE: NEGATIVE
GC PROBE AMP, GENITAL: NEGATIVE

## 2016-11-02 LAB — OB RESULTS CONSOLE ABO/RH: RH Type: POSITIVE

## 2016-11-02 LAB — OB RESULTS CONSOLE RUBELLA ANTIBODY, IGM: Rubella: IMMUNE

## 2016-11-02 LAB — OB RESULTS CONSOLE HEPATITIS B SURFACE ANTIGEN: Hepatitis B Surface Ag: NEGATIVE

## 2016-11-02 LAB — OB RESULTS CONSOLE HIV ANTIBODY (ROUTINE TESTING): HIV: NONREACTIVE

## 2016-11-02 LAB — OB RESULTS CONSOLE ANTIBODY SCREEN: Antibody Screen: NEGATIVE

## 2016-11-02 LAB — OB RESULTS CONSOLE RPR: RPR: NONREACTIVE

## 2016-11-04 ENCOUNTER — Inpatient Hospital Stay (HOSPITAL_COMMUNITY)
Admission: AD | Admit: 2016-11-04 | Discharge: 2016-11-04 | Disposition: A | Discharge status: Home / Self Care | Payer: 59 | Source: Ambulatory Visit | Attending: Obstetrics and Gynecology | Admitting: Obstetrics and Gynecology

## 2016-11-04 ENCOUNTER — Inpatient Hospital Stay (HOSPITAL_COMMUNITY): Payer: 59

## 2016-11-04 ENCOUNTER — Encounter (HOSPITAL_COMMUNITY): Payer: Self-pay | Admitting: *Deleted

## 2016-11-04 DIAGNOSIS — N939 Abnormal uterine and vaginal bleeding, unspecified: Secondary | ICD-10-CM | POA: Diagnosis present

## 2016-11-04 DIAGNOSIS — O208 Other hemorrhage in early pregnancy: Secondary | ICD-10-CM | POA: Diagnosis not present

## 2016-11-04 DIAGNOSIS — O209 Hemorrhage in early pregnancy, unspecified: Secondary | ICD-10-CM

## 2016-11-04 DIAGNOSIS — O200 Threatened abortion: Secondary | ICD-10-CM | POA: Insufficient documentation

## 2016-11-04 DIAGNOSIS — Z3491 Encounter for supervision of normal pregnancy, unspecified, first trimester: Secondary | ICD-10-CM | POA: Diagnosis not present

## 2016-11-04 DIAGNOSIS — Z3A01 Less than 8 weeks gestation of pregnancy: Secondary | ICD-10-CM | POA: Insufficient documentation

## 2016-11-04 DIAGNOSIS — B3731 Acute candidiasis of vulva and vagina: Secondary | ICD-10-CM

## 2016-11-04 DIAGNOSIS — O09521 Supervision of elderly multigravida, first trimester: Secondary | ICD-10-CM | POA: Diagnosis not present

## 2016-11-04 DIAGNOSIS — O98811 Other maternal infectious and parasitic diseases complicating pregnancy, first trimester: Secondary | ICD-10-CM | POA: Diagnosis not present

## 2016-11-04 DIAGNOSIS — B373 Candidiasis of vulva and vagina: Secondary | ICD-10-CM | POA: Insufficient documentation

## 2016-11-04 LAB — CBC WITH DIFFERENTIAL/PLATELET
BASOS ABS: 0.1 10*3/uL (ref 0.0–0.1)
BASOS PCT: 1 %
EOS ABS: 0.3 10*3/uL (ref 0.0–0.7)
Eosinophils Relative: 2 %
HEMATOCRIT: 32.2 % — AB (ref 36.0–46.0)
HEMOGLOBIN: 11.5 g/dL — AB (ref 12.0–15.0)
Lymphocytes Relative: 31 %
Lymphs Abs: 3.5 10*3/uL (ref 0.7–4.0)
MCH: 26 pg (ref 26.0–34.0)
MCHC: 35.7 g/dL (ref 30.0–36.0)
MCV: 72.9 fL — ABNORMAL LOW (ref 78.0–100.0)
Monocytes Absolute: 0.6 10*3/uL (ref 0.1–1.0)
Monocytes Relative: 5 %
NEUTROS PCT: 61 %
Neutro Abs: 6.9 10*3/uL (ref 1.7–7.7)
Platelets: 215 10*3/uL (ref 150–400)
RBC: 4.42 MIL/uL (ref 3.87–5.11)
RDW: 14.5 % (ref 11.5–15.5)
WBC: 11.3 10*3/uL — AB (ref 4.0–10.5)

## 2016-11-04 LAB — WET PREP, GENITAL
CLUE CELLS WET PREP: NONE SEEN
SPERM: NONE SEEN
Trich, Wet Prep: NONE SEEN

## 2016-11-04 LAB — URINALYSIS, ROUTINE W REFLEX MICROSCOPIC
Bilirubin Urine: NEGATIVE
GLUCOSE, UA: NEGATIVE mg/dL
Hgb urine dipstick: NEGATIVE
Ketones, ur: NEGATIVE mg/dL
Nitrite: NEGATIVE
PH: 6 (ref 5.0–8.0)
PROTEIN: NEGATIVE mg/dL
Specific Gravity, Urine: 1.015 (ref 1.005–1.030)

## 2016-11-04 MED ORDER — DOXYLAMINE-PYRIDOXINE 10-10 MG PO TBEC: 1.0000 | DELAYED_RELEASE_TABLET | Freq: Three times a day (TID) | ORAL | 3 refills | Status: DC | PRN

## 2016-11-04 MED ORDER — PROMETHAZINE HCL 25 MG PO TABS: 25.0000 mg | ORAL_TABLET | Freq: Four times a day (QID) | ORAL | 1 refills | Status: DC | PRN

## 2016-11-04 MED ORDER — TERCONAZOLE 0.4 % VA CREA: 1.0000 | TOPICAL_CREAM | Freq: Every day | VAGINAL | 0 refills | Status: DC

## 2016-11-04 NOTE — MAU Provider Note (Signed)
History    First Provider Initiated Solicitor with Patient 11/04/16 [phone]      Chief Complaint:  Vaginal Bleeding and Abdominal Cramping   Karina [name] is  36 y.o. G3P1010 Patient's last menstrual period was 09/19/2016 (exact date).. Patient is here for follow up of quantitative HCG and ongoing surveillance of pregnancy status.   She is [redacted]w[redacted]d weeks gestation  by LMP.  Was seen in MAU 2/16 for abd pain. US show GS and YS, but no FP.  A POS  Since her last visit, the patient is with new complaint.     ROS Abdomin Pain: Mild-unchanged since previous visit Vaginal bleeding: spotting.   Passage of clots or tissue: None Dizziness: None Vaginal discharge: Present  Physical Exam   BP 129/66   Pulse 77   Temp 98.3 F (36.8 C)   Resp 18   Ht 5\' 5"  (1.651 m)   Wt 258 lb (117 kg)   LMP 09/19/2016 (Exact Date)   BMI 42.93 kg/m  Constitutional: Well-nourished, obese, female in no apparent distress. No pallor Neuro: Alert and oriented 4 Cardiovascular: Normal rate Respiratory: Normal effort and rate Abdomen: Soft, nontender Gynecological Exam: normal external genitalia, vulva, vagina, cervix and adnexa. Scant pink, curd-like discharge. Cervix slightly friable, closed. UTA uterine size due to body habitus.  Labs: Results for orders placed or performed during the hospital encounter of 11/04/16 (from the past 24 hour(s))  Wet prep, genital   Collection Time: 11/04/16  8:20 AM  Result Value Ref Range   Yeast Wet Prep HPF POC PRESENT (A) NONE SEEN   Trich, Wet Prep NONE SEEN NONE SEEN   Clue Cells Wet Prep HPF POC NONE SEEN NONE SEEN   WBC, Wet Prep HPF POC MANY (A) NONE SEEN   Sperm NONE SEEN   Urinalysis, Routine w reflex microscopic   Collection Time: 11/04/16  8:20 AM  Result Value Ref Range   Color, Urine YELLOW YELLOW   APPearance HAZY (A) CLEAR   Specific Gravity, Urine 1.015 1.005 - 1.030   pH 6.0 5.0 - 8.0   Glucose, UA NEGATIVE NEGATIVE mg/dL   Hgb urine dipstick  NEGATIVE NEGATIVE   Bilirubin Urine NEGATIVE NEGATIVE   Ketones, ur NEGATIVE NEGATIVE mg/dL   Protein, ur NEGATIVE NEGATIVE mg/dL   Nitrite NEGATIVE NEGATIVE   Leukocytes, UA SMALL (A) NEGATIVE   RBC / HPF 6-30 0 - 5 RBC/hpf   WBC, UA 0-5 0 - 5 WBC/hpf   Bacteria, UA FEW (A) NONE SEEN   Squamous Epithelial / LPF 0-5 (A) NONE SEEN   Mucous PRESENT   CBC with Differential/Platelet   Collection Time: 11/04/16  9:03 AM  Result Value Ref Range   WBC 11.3 (H) 4.0 - 10.5 K/uL   RBC 4.42 3.87 - 5.11 MIL/uL   Hemoglobin 11.5 (L) 12.0 - 15.0 g/dL   HCT 11.9 (L) 14.7 - 82.9 %   MCV 72.9 (L) 78.0 - 100.0 fL   MCH 26.0 26.0 - 34.0 pg   MCHC 35.7 30.0 - 36.0 g/dL   RDW 56.2 13.0 - 86.5 %   Platelets 215 150 - 400 K/uL   Neutrophils Relative % 61 %   Neutro Abs 6.9 1.7 - 7.7 K/uL   Lymphocytes Relative 31 %   Lymphs Abs 3.5 0.7 - 4.0 K/uL   Monocytes Relative 5 %   Monocytes Absolute 0.6 0.1 - 1.0 K/uL   Eosinophils Relative 2 %   Eosinophils Absolute 0.3 0.0 - 0.7  K/uL   Basophils Relative 1 %   Basophils Absolute 0.1 0.0 - 0.1 K/uL    Ultrasound Studies:   US Ob Transvaginal  Result Date: 11/04/2016 CLINICAL DATA:  Vaginal bleeding. Threatened miscarriage. First trimester pregnancy with inconclusive fetal viability. EXAM: TRANSVAGINAL OB ULTRASOUND TECHNIQUE: Transvaginal ultrasound was performed for complete evaluation of the gestation as well as the maternal uterus, adnexal regions, and pelvic cul-de-sac. COMPARISON:  10/26/2016, HSG 07/04/2016, and CT on 05/10/2016 FINDINGS: Intrauterine gestational sac: Single Yolk sac:  Visualized. Embryo:  Visualized. Cardiac Activity: Visualized. Heart Rate: 121 bpm CRL:   7  mm   6 w 4 d                  [place] EDC: 06/26/2017 Subchorionic hemorrhage:  Small subchorionic hemorrhage. Maternal uterus/adnexae: Normal appearance of both ovaries. A 4 cm cystic lesion is seen in the right adnexa which appears separate from the right ovary. This is stable and  consistent with hydrosalpinx as demonstrated on previous HSG. IMPRESSION: Single living IUP measuring 6 weeks 4 days with [place] EDC of 06/16/2017. Small subchorionic hemorrhage. Stable appearance of right hydrosalpinx as demonstrated on previous HSG. Electronically Signed   By: Myles Rosenthal M.D.   On: 11/04/2016 11:10    MAU course/MDM: [place], Wet prep, GC/Chlamydia cultures, UA ordered  Pain and bleeding in early pregnancy with normal live IUP, hemodynamically stable. VVC, SCH likely cause of bleeding and pain. Assessment: 1. Threatened miscarriage   2. Vaginal bleeding in pregnancy, first trimester   3. Normal IUP (intrauterine pregnancy) on prenatal ultrasound, first trimester   4. Vaginal yeast infection    Plan: Discharge home in stable condition per consult w/ Dr. Dion Body. Bleeding precautions Rx Terazol Pelvic rest x 1 week Follow-up Information    [facility] Follow up.   Specialty:  Obstetrics and Gynecology Why:  as scheduled or sooner as needed if symptoms worsen Contact information: [address] [place] [address] [phone]       [facility]ERNITY ADMISSIONS Follow up.   Why:  In Emergencies Contact information: [address] [id number] mc [place] [address] [phone]         Allergies as of 11/04/2016   No Known Allergies     Medication List    TAKE these medications   acetaminophen 500 MG tablet Commonly known as:  TYLENOL Take 500 mg by mouth every 6 (six) hours as needed for mild pain, moderate pain or headache.   albuterol 108 (90 Base) MCG/ACT inhaler Commonly known as:  PROVENTIL HFA;VENTOLIN HFA Inhale 1-2 puffs into the lungs every 6 (six) hours as needed for wheezing or shortness of breath.   calcium carbonate 500 MG chewable tablet Commonly known as:  TUMS - dosed in mg elemental calcium Chew 1 tablet by mouth every 3 (three)  hours as needed for indigestion or heartburn.   Doxylamine-Pyridoxine 10-10 MG Tbec Take 1-2 tablets by mouth 3 (three) times daily as needed (nausea). Start with 2 tablets every evening. If symptoms persist, add 1 tablet every morning. If symptoms persist, add 1 tablet at mid-day.   omeprazole 40 MG capsule Commonly known as:  PRILOSEC Take 40 mg by mouth daily.   PRENATE PIXIE 10-0.6-0.4-200 MG Caps Take 1 capsule by mouth at bedtime.   progesterone 200 MG capsule Commonly known as:  PROMETRIUM Place 200 mg vaginally at bedtime.   promethazine 25 MG tablet Commonly known as:  PHENERGAN Take 1 tablet (25 mg total) by mouth every 6 (six) hours as needed.   terconazole 0.4 % vaginal cream Commonly known as:  TERAZOL 7 Place 1 applicator vaginally at bedtime.       Dorathy KinsmanVirginia Marcele Kosta, CNM 11/04/2016, 11:10 AM  2/3

## 2016-11-04 NOTE — MAU Note (Signed)
Pt presents to MAU with complaints of pink vaginal discharge and lower abdominal cramping. Pt was evaluated on the 16th of February for abdominal cramping but did not have vaginal spotting at that time

## 2016-11-04 NOTE — Discharge Instructions (Signed)
Vaginal Bleeding During Pregnancy, First Trimester A small amount of bleeding (spotting) from the vagina is relatively common in early pregnancy. It usually stops on its own. Various things may cause bleeding or spotting in early pregnancy. Some bleeding may be related to the pregnancy, and some may not. In most cases, the bleeding is normal and is not a problem. However, bleeding can also be a sign of something serious. Be sure to tell your health care provider about any vaginal bleeding right away. Some possible causes of vaginal bleeding during the first trimester include:  Infection or inflammation of the cervix.  Growths (polyps) on the cervix.  Miscarriage or threatened miscarriage.  Pregnancy tissue has developed outside of the uterus and in a fallopian tube (tubal pregnancy).  Tiny cysts have developed in the uterus instead of pregnancy tissue (molar pregnancy). Follow these instructions at home: Watch your condition for any changes. The following actions may help to lessen any discomfort you are feeling:  Follow your health care provider's instructions for limiting your activity. If your health care provider orders bed rest, you may need to stay in bed and only get up to use the bathroom. However, your health care provider may allow you to continue light activity.  If needed, make plans for someone to help with your regular activities and responsibilities while you are on bed rest.  Keep track of the number of pads you use each day, how often you change pads, and how soaked (saturated) they are. Write this down.  Do not use tampons. Do not douche.  Do not have sexual intercourse or orgasms until approved by your health care provider.  If you pass any tissue from your vagina, save the tissue so you can show it to your health care provider.  Only take over-the-counter or prescription medicines as directed by your health care provider.  Do not take aspirin because it can make you  bleed.  Keep all follow-up appointments as directed by your health care provider. Contact a health care provider if:  You have any vaginal bleeding during any part of your pregnancy.  You have cramps or labor pains.  You have a fever, not controlled by medicine. Get help right away if:  You have severe cramps in your back or belly (abdomen).  You pass large clots or tissue from your vagina.  Your bleeding increases.  You feel light-headed or weak, or you have fainting episodes.  You have chills.  You are leaking fluid or have a gush of fluid from your vagina.  You pass out while having a bowel movement. This information is not intended to replace advice given to you by your health care provider. Make sure you discuss any questions you have with your health care provider. Document Released: 06/06/2005 Document Revised: 02/02/2016 Document Reviewed: 05/04/2013 Elsevier Interactive Patient Education  2017 Elsevier Inc.   Vaginal Yeast infection, Adult Vaginal yeast infection is a condition that causes soreness, swelling, and redness (inflammation) of the vagina. It also causes vaginal discharge. This is a common condition. Some women get this infection frequently. What are the causes? This condition is caused by a change in the normal balance of the yeast (candida) and bacteria that live in the vagina. This change causes an overgrowth of yeast, which causes the inflammation. What increases the risk? This condition is more likely to develop in:  Women who take antibiotic medicines.  Women who have diabetes.  Women who take birth control pills.  Women who are  pregnant.  Women who douche often.  Women who have a weak defense (immune) system.  Women who have been taking steroid medicines for a long time.  Women who frequently wear tight clothing. What are the signs or symptoms? Symptoms of this condition include:  White, thick vaginal discharge.  Swelling, itching,  redness, and irritation of the vagina. The lips of the vagina (vulva) may be affected as well.  Pain or a burning feeling while urinating.  Pain during sex. How is this diagnosed? This condition is diagnosed with a medical history and physical exam. This will include a pelvic exam. Your health care provider will examine a sample of your vaginal discharge under a microscope. Your health care provider may send this sample for testing to confirm the diagnosis. How is this treated? This condition is treated with medicine. Medicines may be over-the-counter or prescription. You may be told to use one or more of the following:  Medicine that is taken orally.  Medicine that is applied as a cream.  Medicine that is inserted directly into the vagina (suppository). Follow these instructions at home:  Take or apply over-the-counter and prescription medicines only as told by your health care provider.  Do not have sex until your health care provider has approved. Tell your sex partner that you have a yeast infection. That person should go to his or her health care provider if he or she develops symptoms.  Do not wear tight clothes, such as pantyhose or tight pants.  Avoid using tampons until your health care provider approves.  Eat more yogurt. This may help to keep your yeast infection from returning.  Try taking a sitz bath to help with discomfort. This is a warm water bath that is taken while you are sitting down. The water should only come up to your hips and should cover your buttocks. Do this 3-4 times per day or as told by your health care provider.  Do not douche.  Wear breathable, cotton underwear.  If you have diabetes, keep your blood sugar levels under control. Contact a health care provider if:  You have a fever.  Your symptoms go away and then return.  Your symptoms do not get better with treatment.  Your symptoms get worse.  You have new symptoms.  You develop blisters  in or around your vagina.  You have blood coming from your vagina and it is not your menstrual period.  You develop pain in your abdomen. This information is not intended to replace advice given to you by your health care provider. Make sure you discuss any questions you have with your health care provider. Document Released: 06/06/2005 Document Revised: 02/08/2016 Document Reviewed: 02/28/2015 Elsevier Interactive Patient Education  2017 [employer].

## 2016-11-05 LAB — HIV ANTIBODY (ROUTINE TESTING W REFLEX): HIV SCREEN 4TH GENERATION: NONREACTIVE

## 2016-11-05 LAB — GC/CHLAMYDIA PROBE AMP (~~LOC~~) NOT AT ARMC
CHLAMYDIA, DNA PROBE: NEGATIVE
Neisseria Gonorrhea: NEGATIVE

## 2016-11-07 DIAGNOSIS — N7011 Chronic salpingitis: Secondary | ICD-10-CM | POA: Insufficient documentation

## 2016-11-14 DIAGNOSIS — N979 Female infertility, unspecified: Secondary | ICD-10-CM | POA: Diagnosis not present

## 2016-11-14 DIAGNOSIS — Z3A08 8 weeks gestation of pregnancy: Secondary | ICD-10-CM | POA: Diagnosis not present

## 2016-11-14 DIAGNOSIS — O3680X0 Pregnancy with inconclusive fetal viability, not applicable or unspecified: Secondary | ICD-10-CM | POA: Diagnosis not present

## 2016-11-26 DIAGNOSIS — O3680X0 Pregnancy with inconclusive fetal viability, not applicable or unspecified: Secondary | ICD-10-CM | POA: Diagnosis not present

## 2016-11-26 DIAGNOSIS — Z3A09 9 weeks gestation of pregnancy: Secondary | ICD-10-CM | POA: Diagnosis not present

## 2016-11-26 DIAGNOSIS — O219 Vomiting of pregnancy, unspecified: Secondary | ICD-10-CM | POA: Diagnosis not present

## 2016-12-04 DIAGNOSIS — K219 Gastro-esophageal reflux disease without esophagitis: Secondary | ICD-10-CM | POA: Diagnosis not present

## 2016-12-04 DIAGNOSIS — K760 Fatty (change of) liver, not elsewhere classified: Secondary | ICD-10-CM | POA: Diagnosis not present

## 2016-12-04 DIAGNOSIS — R748 Abnormal levels of other serum enzymes: Secondary | ICD-10-CM | POA: Diagnosis not present

## 2016-12-12 DIAGNOSIS — Z3A01 Less than 8 weeks gestation of pregnancy: Secondary | ICD-10-CM | POA: Diagnosis not present

## 2016-12-12 DIAGNOSIS — O09511 Supervision of elderly primigravida, first trimester: Secondary | ICD-10-CM | POA: Diagnosis not present

## 2016-12-12 DIAGNOSIS — O3680X0 Pregnancy with inconclusive fetal viability, not applicable or unspecified: Secondary | ICD-10-CM | POA: Diagnosis not present

## 2016-12-13 DIAGNOSIS — R748 Abnormal levels of other serum enzymes: Secondary | ICD-10-CM | POA: Diagnosis not present

## 2016-12-27 DIAGNOSIS — Z3402 Encounter for supervision of normal first pregnancy, second trimester: Secondary | ICD-10-CM | POA: Diagnosis not present

## 2016-12-27 DIAGNOSIS — Z369 Encounter for antenatal screening, unspecified: Secondary | ICD-10-CM | POA: Diagnosis not present

## 2016-12-27 DIAGNOSIS — Z3A14 14 weeks gestation of pregnancy: Secondary | ICD-10-CM | POA: Diagnosis not present

## 2016-12-27 DIAGNOSIS — N898 Other specified noninflammatory disorders of vagina: Secondary | ICD-10-CM | POA: Diagnosis not present

## 2017-01-15 DIAGNOSIS — Z3402 Encounter for supervision of normal first pregnancy, second trimester: Secondary | ICD-10-CM | POA: Diagnosis not present

## 2017-01-15 DIAGNOSIS — R102 Pelvic and perineal pain: Secondary | ICD-10-CM | POA: Diagnosis not present

## 2017-01-15 DIAGNOSIS — Z3A16 16 weeks gestation of pregnancy: Secondary | ICD-10-CM | POA: Diagnosis not present

## 2017-01-24 ENCOUNTER — Telehealth: Payer: Self-pay | Admitting: Internal Medicine

## 2017-01-24 NOTE — Telephone Encounter (Signed)
Fine with me

## 2017-01-24 NOTE — Telephone Encounter (Signed)
MW- please advise if ok to switch docs, thanks

## 2017-01-24 NOTE — Telephone Encounter (Signed)
BQ please advise if you are okay when taken pt on. You have availability on 02/23/16

## 2017-01-25 NOTE — Telephone Encounter (Signed)
I will ONLY take this patient in a 30 minute slot.  No exceptions.

## 2017-01-25 NOTE — Telephone Encounter (Signed)
I have called and LMOM for Lawson [facility] to call back.  This pt can be scheduled with Dr. Kendrick FriesMcQuaid but will need a 30 min slot---no exceptions per BQ.  thanks

## 2017-01-28 NOTE — Telephone Encounter (Signed)
Karina [name] with [facility] called back and states she spoke with patient yesterday and patient stated she did not want to see anyone in this office at this time and wants to be seen elsewhere.  No call back is needed and may close message.

## 2017-01-28 NOTE — Telephone Encounter (Signed)
LMOM TCB x1 to scheduling please see BQ's message in regards to scheduling pt

## 2017-02-05 DIAGNOSIS — Z363 Encounter for antenatal screening for malformations: Secondary | ICD-10-CM | POA: Diagnosis not present

## 2017-02-05 DIAGNOSIS — Z369 Encounter for antenatal screening, unspecified: Secondary | ICD-10-CM | POA: Diagnosis not present

## 2017-02-05 DIAGNOSIS — Z3A19 19 weeks gestation of pregnancy: Secondary | ICD-10-CM | POA: Diagnosis not present

## 2017-02-21 DIAGNOSIS — H5213 Myopia, bilateral: Secondary | ICD-10-CM | POA: Diagnosis not present

## 2017-03-04 DIAGNOSIS — M545 Low back pain: Secondary | ICD-10-CM | POA: Diagnosis not present

## 2017-03-04 DIAGNOSIS — N898 Other specified noninflammatory disorders of vagina: Secondary | ICD-10-CM | POA: Diagnosis not present

## 2017-03-04 DIAGNOSIS — R5383 Other fatigue: Secondary | ICD-10-CM | POA: Diagnosis not present

## 2017-03-04 DIAGNOSIS — Z3A23 23 weeks gestation of pregnancy: Secondary | ICD-10-CM | POA: Diagnosis not present

## 2017-03-04 DIAGNOSIS — Z3402 Encounter for supervision of normal first pregnancy, second trimester: Secondary | ICD-10-CM | POA: Diagnosis not present

## 2017-03-04 DIAGNOSIS — Z362 Encounter for other antenatal screening follow-up: Secondary | ICD-10-CM | POA: Diagnosis not present

## 2017-03-23 ENCOUNTER — Telehealth: Payer: 59 | Admitting: Nurse Practitioner

## 2017-03-23 DIAGNOSIS — M5442 Lumbago with sciatica, left side: Secondary | ICD-10-CM | POA: Diagnosis not present

## 2017-03-23 NOTE — Progress Notes (Signed)
Based on what you shared with me it looks like you have a serious condition that should be evaluated in a face to face office visit.   Unfortunately do to your  prenancy I am not able to give you the meds we would normally prescribe for this.  Pleas contact your ob/gyn to see if he /she may have some suggestions   NOTE: Even if you have entered your credit card information for this eVisit, you will not be charged.   If you are having a true medical emergency please call 911.  If you need an urgent face to face visit, [place] has four urgent care centers for your convenience.  If you need care fast and have a high deductible or no insurance consider:   [email or web address]  [phone]  [address], [address] [place], [place] [address] 8 am to 8 pm Monday-Friday 10 am to 4 pm Saturday-Sunday   The following sites will take your  insurance:    . [facility]  [phone] Get Driving Directions Find a Provider at this Location  [address] [place], [place] [address] . 10 am to 8 pm Monday-Friday . 12 pm to 8 pm Saturday-Sunday   . [facility]  [phone] Get Driving Directions Find a Provider at this Location  [address] [place], [place] [address] . 8 am to 8 pm Monday-Friday . 9 am to 6 pm Saturday . 11 am to 6 pm Sunday   . [facility] [place]  [phone] Get Driving Directions  [address] [place], [place] [address] . 8 am to 8 pm Monday-Friday . 8 am to 4 pm Saturday-Sunday   Your e-visit answers were reviewed by a board certified advanced clinical practitioner to complete your personal care plan.  Thank you for using e-Visits.

## 2017-03-26 DIAGNOSIS — Z3492 Encounter for supervision of normal pregnancy, unspecified, second trimester: Secondary | ICD-10-CM | POA: Diagnosis not present

## 2017-03-26 DIAGNOSIS — Z3A26 26 weeks gestation of pregnancy: Secondary | ICD-10-CM | POA: Diagnosis not present

## 2017-03-26 DIAGNOSIS — G8929 Other chronic pain: Secondary | ICD-10-CM | POA: Diagnosis not present

## 2017-04-02 DIAGNOSIS — Z3A27 27 weeks gestation of pregnancy: Secondary | ICD-10-CM | POA: Diagnosis not present

## 2017-04-02 DIAGNOSIS — O368121 Decreased fetal movements, second trimester, fetus 1: Secondary | ICD-10-CM | POA: Diagnosis not present

## 2017-04-02 DIAGNOSIS — Z3402 Encounter for supervision of normal first pregnancy, second trimester: Secondary | ICD-10-CM | POA: Diagnosis not present

## 2017-04-02 DIAGNOSIS — Z23 Encounter for immunization: Secondary | ICD-10-CM | POA: Diagnosis not present

## 2017-04-02 DIAGNOSIS — D649 Anemia, unspecified: Secondary | ICD-10-CM | POA: Diagnosis not present

## 2017-04-02 DIAGNOSIS — Z362 Encounter for other antenatal screening follow-up: Secondary | ICD-10-CM | POA: Diagnosis not present

## 2017-04-05 DIAGNOSIS — E611 Iron deficiency: Secondary | ICD-10-CM | POA: Insufficient documentation

## 2017-04-19 DIAGNOSIS — Z3403 Encounter for supervision of normal first pregnancy, third trimester: Secondary | ICD-10-CM | POA: Diagnosis not present

## 2017-04-19 DIAGNOSIS — O99213 Obesity complicating pregnancy, third trimester: Secondary | ICD-10-CM | POA: Diagnosis not present

## 2017-04-19 DIAGNOSIS — R809 Proteinuria, unspecified: Secondary | ICD-10-CM | POA: Diagnosis not present

## 2017-04-19 DIAGNOSIS — Z3A30 30 weeks gestation of pregnancy: Secondary | ICD-10-CM | POA: Diagnosis not present

## 2017-04-19 DIAGNOSIS — R002 Palpitations: Secondary | ICD-10-CM | POA: Diagnosis not present

## 2017-04-24 ENCOUNTER — Ambulatory Visit (INDEPENDENT_AMBULATORY_CARE_PROVIDER_SITE_OTHER): Payer: 59 | Admitting: Cardiology

## 2017-04-24 ENCOUNTER — Other Ambulatory Visit: Payer: Self-pay | Admitting: Cardiology

## 2017-04-24 ENCOUNTER — Encounter: Payer: Self-pay | Admitting: Cardiology

## 2017-04-24 VITALS — BP 102/60 | HR 84 | Ht 65.0 in | Wt 280.0 lb

## 2017-04-24 DIAGNOSIS — J4520 Mild intermittent asthma, uncomplicated: Secondary | ICD-10-CM | POA: Diagnosis not present

## 2017-04-24 DIAGNOSIS — R002 Palpitations: Secondary | ICD-10-CM

## 2017-04-24 DIAGNOSIS — R011 Cardiac murmur, unspecified: Secondary | ICD-10-CM

## 2017-04-24 NOTE — Addendum Note (Signed)
Addended by: Rodney LangtonITTER, Freddi Forster M on: 04/24/2017 12:12 PM   Modules accepted: Orders

## 2017-04-24 NOTE — Patient Instructions (Signed)
Medication Instructions:  Your physician recommends that you continue on your current medications as directed. Please refer to the Current Medication list given to you today.  Labwork: Your physician recommends that you have your thyroid level checked today.   Testing/Procedures: Your physician has requested that you have an echocardiogram. Echocardiography is a painless test that uses sound waves to create images of your heart. It provides your doctor with information about the size and shape of your heart and how well your heart's chambers and valves are working. This procedure takes approximately one hour. There are no restrictions for this procedure.  Your physician has recommended that you wear a holter monitor. Holter monitors are medical devices that record the heart's electrical activity. Doctors most often use these monitors to diagnose arrhythmias. Arrhythmias are problems with the speed or rhythm of the heartbeat. The monitor is a small, portable device. You can wear one while you do your normal daily activities. This is usually used to diagnose what is causing palpitations/syncope (passing out).  Please report to [address] [place], [place] the day of your testing.   Follow-Up: Your physician wants you to follow-up in: 6 months. You will receive a reminder letter in the mail two months in advance. If you don't receive a letter, please call our office to schedule the follow-up appointment.  Any Other Special Instructions Will Be Listed Below (If Applicable).  Please note that any paperwork needing to be filled out by the provider will need to be addressed at the front desk prior to seeing the provider. Please note that any paperwork FMLA, Disability or other documents regarding health condition is subject to a $25.00 charge that must be received prior to completion of paperwork in the form of a money order or check.    If you need a refill on your cardiac  medications before your next appointment, please call your pharmacy.

## 2017-04-24 NOTE — Progress Notes (Signed)
Cardiology Office Note:    Date:  04/24/2017   ID:  [name], DOB 16-Sep-1980, MRN [id number]  PCP:  Bailey Mech, PA-C  Cardiologist:  Garwin Brothers, MD   Referring MD: Bernerd Pho*    ASSESSMENT:    1. Mild intermittent asthma without complication   2. Palpitation   3. Cardiac murmur    PLAN:    In order of problems listed above:  1. I reviewed my findings with the patient at extensive length. I reassured her. In view of her palpitations I will check her TSH. Echocardiogram will be done to assess murmur heard on auscultation. She has been told of a "leaky valve" in the past. Also she will have a 24-hour Holter monitor to see what exactly is her rhythm during the 24-hour period. She denies any syncope or any such problems. I told her about precautions about posture and laying on her left side and she is already aware of this. She knows to go to the nearest emergency room for any significant concerns. She will be seen in follow-up appointment on a when necessary basis.   Medication Adjustments/Labs and Tests Ordered: Current medicines are reviewed at length with the patient today.  Concerns regarding medicines are outlined above.  No orders of the defined types were placed in this encounter.  No orders of the defined types were placed in this encounter.    History of Present Illness:    [name] is a 36 y.o. female who is being seen today for the evaluation of Palpitations at the request of Podraza, Harrell Lark*. Patient is a pleasant 36 year old female. She has past medical history of anemia. She's been referred as in the past 2-3 weeks she's noticed palpitations. No chest pain orthopnea or PND with this. She is in 32nd week of pregnancy. She's been told of a murmur at the past. No history of syncope or any other such problems. At the time of my evaluation she is alert awake oriented and in no distress.  Past Medical History:    Diagnosis Date  . Anemia    takes iron daily  . Asthma    albuterol as needed  . Chronic bronchitis   . Diabetes mellitus   . Edema extremities    hx of edema in lower ext. takes lasix PRN last dose few weeks ago  . Gallbladder disease   . GERD (gastroesophageal reflux disease)    nexium   . History of chicken pox   . Neuropathy   . Pregnant   . Sleep apnea    cpap    Past Surgical History:  Procedure Laterality Date  . CARPAL TUNNEL RELEASE  2008, 2010  . CHOLECYSTECTOMY  01/11/2012   Procedure: LAPAROSCOPIC CHOLECYSTECTOMY WITH INTRAOPERATIVE CHOLANGIOGRAM;  Surgeon: Cherylynn Ridges, MD;  Location: [facility] OR;  Service: General;  Laterality: N/A;  . LAPAROSCOPIC GASTRIC BANDING    . NO PAST SURGERIES      Current Medications: Current Meds  Medication Sig  . acetaminophen (TYLENOL) 500 MG tablet Take 500 mg by mouth every 6 (six) hours as needed for mild pain, moderate pain or headache.  . albuterol (PROVENTIL HFA;VENTOLIN HFA) 108 (90 BASE) MCG/ACT inhaler Inhale 1-2 puffs into the lungs every 6 (six) hours as needed for wheezing or shortness of breath.   . calcium carbonate (TUMS - DOSED IN MG ELEMENTAL CALCIUM) 500 MG chewable tablet Chew 1 tablet by mouth every 3 (three) hours as  needed for indigestion or heartburn.   . famotidine (PEPCID) 20 MG tablet Take 20 mg by mouth 2 (two) times daily.  . Prenatal Multivit-Min-Fe-FA (PRENATAL VITAMINS) 0.8 MG tablet Take 1 tablet by mouth daily.     Allergies:   Patient has no known allergies.   Social History   Social History  . Marital status: Married    Spouse name: N/A  . Number of children: 1  . Years of education: college   Social History Main Topics  . Smoking status: Former Smoker    Packs/day: 0.25    Years: 1.00    Types: Cigars, Cigarettes    Quit date: 09/11/1995  . Smokeless tobacco: Never Used  . Alcohol use Yes     Comment: socially  . Drug use: No  . 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 None   Social History Narrative   PCP-Dr. Deirdre [place]ra [place] in [place]   Drinks 1-2 cups of coffee a day      Family History: The patient's family history includes Anesthesia problems in her mother; Asthma in her maternal grandmother and sister; Cervical cancer in her maternal aunt; Diabetes in her sister; Heart disease in her other; Hyperlipidemia in her other; Hypertension in her other; Kidney disease in her other; Liver cancer in her paternal grandfather and paternal uncle; Ovarian cancer in her maternal aunt; Prostate cancer in her maternal uncle and paternal grandfather; Stroke in her other; Thyroid disease in her other. There is no history of Hypotension, Malignant hyperthermia, or Pseudochol deficiency.  ROS:   Please see the history of present illness.    All other systems reviewed and are negative.  EKGs/Labs/Other Studies Reviewed:    The following studies were reviewed today: I reviewed records from primary care physician's notes. EKG done today reveals sinus rhythm and nonspecific ST-T changes.   Recent Labs: 11/04/2016: Hemoglobin 11.5; Platelets 215  Recent Lipid Panel No results found for: CHOL, TRIG, HDL, CHOLHDL, VLDL, LDLCALC, LDLDIRECT  Physical Exam:    VS:  BP 102/60   Pulse (!) 112   Ht 5\' 5"  (1.651 m)   Wt 280 lb (127 kg)   LMP 09/19/2016 (Exact Date)   SpO2 98%   BMI 46.59 kg/m     Wt Readings from Last 3 Encounters:  04/24/17 280 lb (127 kg)  11/04/16 258 lb (117 kg)  10/26/16 265 lb (120.2 kg)     GEN: Patient is in no acute distress HEENT: Normal NECK: No JVD; No carotid bruits LYMPHATICS: No lymphadenopathy CARDIAC: S1 S2 regular, 2/6 systolic murmur at the apex. RESPIRATORY:  Clear to auscultation without rales, wheezing or rhonchi  ABDOMEN: Soft, non-tender, non-distended MUSCULOSKELETAL:  No edema; No deformity  SKIN: Warm and  dry NEUROLOGIC:  Alert and oriented x 3 PSYCHIATRIC:  Normal affect    Signed, Garwin Brothersajan R Algernon Mundie, MD  04/24/2017 11:59 AM    [facility]

## 2017-04-25 LAB — TSH: TSH: 2.46 u[IU]/mL (ref 0.450–4.500)

## 2017-05-01 DIAGNOSIS — Z3A32 32 weeks gestation of pregnancy: Secondary | ICD-10-CM | POA: Diagnosis not present

## 2017-05-01 DIAGNOSIS — O99213 Obesity complicating pregnancy, third trimester: Secondary | ICD-10-CM | POA: Diagnosis not present

## 2017-05-07 ENCOUNTER — Ambulatory Visit (HOSPITAL_COMMUNITY): Payer: 59 | Attending: Cardiology

## 2017-05-07 ENCOUNTER — Ambulatory Visit (INDEPENDENT_AMBULATORY_CARE_PROVIDER_SITE_OTHER): Payer: 59

## 2017-05-07 DIAGNOSIS — R011 Cardiac murmur, unspecified: Secondary | ICD-10-CM | POA: Insufficient documentation

## 2017-05-07 DIAGNOSIS — R002 Palpitations: Secondary | ICD-10-CM | POA: Diagnosis not present

## 2017-05-08 DIAGNOSIS — O99213 Obesity complicating pregnancy, third trimester: Secondary | ICD-10-CM | POA: Diagnosis not present

## 2017-05-08 DIAGNOSIS — Z3A33 33 weeks gestation of pregnancy: Secondary | ICD-10-CM | POA: Diagnosis not present

## 2017-05-16 DIAGNOSIS — O99213 Obesity complicating pregnancy, third trimester: Secondary | ICD-10-CM | POA: Diagnosis not present

## 2017-05-16 DIAGNOSIS — K760 Fatty (change of) liver, not elsewhere classified: Secondary | ICD-10-CM | POA: Diagnosis not present

## 2017-05-16 DIAGNOSIS — Z3A34 34 weeks gestation of pregnancy: Secondary | ICD-10-CM | POA: Diagnosis not present

## 2017-05-16 DIAGNOSIS — R52 Pain, unspecified: Secondary | ICD-10-CM | POA: Diagnosis not present

## 2017-05-16 DIAGNOSIS — Z3493 Encounter for supervision of normal pregnancy, unspecified, third trimester: Secondary | ICD-10-CM | POA: Diagnosis not present

## 2017-05-21 ENCOUNTER — Telehealth: Payer: Self-pay

## 2017-05-21 NOTE — Telephone Encounter (Signed)
Per DPR regarding results of holter.

## 2017-05-22 DIAGNOSIS — Z3493 Encounter for supervision of normal pregnancy, unspecified, third trimester: Secondary | ICD-10-CM | POA: Diagnosis not present

## 2017-05-22 DIAGNOSIS — Z3A35 35 weeks gestation of pregnancy: Secondary | ICD-10-CM | POA: Diagnosis not present

## 2017-05-22 DIAGNOSIS — A609 Anogenital herpesviral infection, unspecified: Secondary | ICD-10-CM | POA: Diagnosis not present

## 2017-05-22 DIAGNOSIS — O99213 Obesity complicating pregnancy, third trimester: Secondary | ICD-10-CM | POA: Diagnosis not present

## 2017-05-23 ENCOUNTER — Telehealth: Payer: 59 | Admitting: Physician Assistant

## 2017-05-23 DIAGNOSIS — Z3490 Encounter for supervision of normal pregnancy, unspecified, unspecified trimester: Secondary | ICD-10-CM

## 2017-05-23 DIAGNOSIS — R21 Rash and other nonspecific skin eruption: Secondary | ICD-10-CM

## 2017-05-23 NOTE — Progress Notes (Signed)
For the safety of you and your child, I recommend a face to face office visit with a health care provider.  I would question if you are getting some type of dishydrotic eczema but it is very hard to tell from that picture alone. Because you are pregnant, we have to be very careful with medications. I want you to have a visit with a provider for an examination so the most appropriate (and safest for baby) treatment can be given.  Many mothers need to take medicines during their pregnancy and while nursing.  Almost all medicines pass into the breast milk in small quantities.  Most are generally considered safe for a mother to take but some medicines must be avoided.  After reviewing your E-Visit request, I recommend that you consult your OB/GYN or pediatrician for medical advice in relation to your condition and prescription medications while pregnant or breastfeeding.  If you are having a true medical emergency please call 911.  If you need an urgent face to face visit, [place] has four urgent care centers for your convenience.  If you need care fast and have a high deductible or no insurance consider:   [email or web address]  [phone]  [address] [place], [place] [address] 8 am to 8 pm Monday-Friday 10 am to 4 pm Saturday-Sunday   The following sites will take your  insurance:    [facility]  [phone] Get Driving Directions Find a Provider at this Location  [address] [place], [place] [address] 10 am to 8 pm Monday-Friday 12 pm to 8 pm [facility]  [phone] Get Driving Directions Find a Provider at this Location  [address] [place], [place] [address] 8 am to 8 pm Monday-Friday 9 am to 6 pm Saturday 11 am to 6 pm Sunday   [facility]  [phone] Get Driving Directions  [address] [place], [place] [address] 8 am to 8 pm Monday-Friday 8 am to 4 pm Saturday-Sunday   Your e-visit answers were reviewed by a board certified advanced clinical practitioner to complete your personal care plan.  Thank you for using e-Visits.  ===View-only below this line===   ----- Message -----    From: [name]    Sent: 05/23/2017  3:30 PM EDT      To: E-Visit Mailing List Subject: E-Visit Submission: Rash  E-Visit Submission: Rash --------------------------------  Question: Where is your rash located? (check all that apply) Answer:   Hands  Question: Is the rash is an area that tends to be moist or sweaty? Answer:   No  Question: Rash location "other"/comments: Answer:   On fingers. It starts off as little balls with clear fluid and they itch really bad then they burst and turn into cuts. It started one finger now has spread to 3 more.  Question: Are you able to attach a photo? Please do if possible Answer:   Yes  Question: Do you have a fever? Answer:   No  Question: Does your rash itch? Answer:   Yes  Question: Did the rash appear after shaving or other skin irritation? Answer:   No  Question: Is the rash painful? Answer:   Yes  Question: Please rate your pain on a scale of 1-10. 1 being the least and 10 being the worst Answer:   6  Question: Do you have a  red rash on one side of your face or body? Answer:   No  Question: Is it painful or tender to touch? Answer:   Yes  Question: Have you ever had the chicken pox? Answer:   Yes  Question: Have you ever had Shingles before? Answer:   No  Question: Have you had the Shingles vaccine? Answer:   No  Question: If yes, approximately when did you receive the vaccine? Answer:     Question: Does your rash appear "wet" or weeping? Answer:   Yes  Question: When did your symptoms start? Answer:   More than 2 weeks ago  Question: Have you tried any over-the-counter medications? Answer:   Other (please  list in next comment box)  Question: Over-the-counter medications comments Answer:   Cortizone cream. Aveno lotion  Question: Are you pregnant? Answer:   I am pregnant  Question: Are you breastfeeding? Answer:   No  Question: Do you have kidney failure or any kidney insufficiencies? Answer:   No  Question: Please list your medication allergies that you may have ? (If 'none' , please list as 'none') Answer:   None

## 2017-05-30 DIAGNOSIS — O99213 Obesity complicating pregnancy, third trimester: Secondary | ICD-10-CM | POA: Diagnosis not present

## 2017-05-30 DIAGNOSIS — Z3A36 36 weeks gestation of pregnancy: Secondary | ICD-10-CM | POA: Diagnosis not present

## 2017-06-06 DIAGNOSIS — O99213 Obesity complicating pregnancy, third trimester: Secondary | ICD-10-CM | POA: Diagnosis not present

## 2017-06-06 DIAGNOSIS — Z3A37 37 weeks gestation of pregnancy: Secondary | ICD-10-CM | POA: Diagnosis not present

## 2017-06-12 DIAGNOSIS — Z3A38 38 weeks gestation of pregnancy: Secondary | ICD-10-CM | POA: Diagnosis not present

## 2017-06-12 DIAGNOSIS — Z3493 Encounter for supervision of normal pregnancy, unspecified, third trimester: Secondary | ICD-10-CM | POA: Diagnosis not present

## 2017-06-12 DIAGNOSIS — R102 Pelvic and perineal pain: Secondary | ICD-10-CM | POA: Diagnosis not present

## 2017-06-12 DIAGNOSIS — O99213 Obesity complicating pregnancy, third trimester: Secondary | ICD-10-CM | POA: Diagnosis not present

## 2017-06-17 ENCOUNTER — Telehealth (HOSPITAL_COMMUNITY): Payer: Self-pay | Admitting: *Deleted

## 2017-06-17 ENCOUNTER — Encounter (HOSPITAL_COMMUNITY): Payer: Self-pay | Admitting: *Deleted

## 2017-06-17 NOTE — Telephone Encounter (Signed)
Preadmission screen  

## 2017-06-19 ENCOUNTER — Inpatient Hospital Stay (HOSPITAL_COMMUNITY)
Admission: AD | Admit: 2017-06-19 | Discharge: 2017-06-21 | DRG: 806 | Disposition: A | Discharge status: Home / Self Care | Payer: 59 | Source: Ambulatory Visit | Attending: Obstetrics and Gynecology | Admitting: Obstetrics and Gynecology

## 2017-06-19 ENCOUNTER — Encounter (HOSPITAL_COMMUNITY): Payer: Self-pay

## 2017-06-19 ENCOUNTER — Inpatient Hospital Stay (HOSPITAL_COMMUNITY): Payer: 59 | Admitting: Anesthesiology

## 2017-06-19 ENCOUNTER — Other Ambulatory Visit: Payer: Self-pay | Admitting: Obstetrics and Gynecology

## 2017-06-19 DIAGNOSIS — D649 Anemia, unspecified: Secondary | ICD-10-CM | POA: Diagnosis present

## 2017-06-19 DIAGNOSIS — O99214 Obesity complicating childbirth: Secondary | ICD-10-CM | POA: Diagnosis present

## 2017-06-19 DIAGNOSIS — K219 Gastro-esophageal reflux disease without esophagitis: Secondary | ICD-10-CM | POA: Diagnosis present

## 2017-06-19 DIAGNOSIS — Z6841 Body Mass Index (BMI) 40.0 and over, adult: Secondary | ICD-10-CM | POA: Diagnosis not present

## 2017-06-19 DIAGNOSIS — A6000 Herpesviral infection of urogenital system, unspecified: Secondary | ICD-10-CM | POA: Diagnosis present

## 2017-06-19 DIAGNOSIS — O9902 Anemia complicating childbirth: Secondary | ICD-10-CM | POA: Diagnosis present

## 2017-06-19 DIAGNOSIS — O99844 Bariatric surgery status complicating childbirth: Secondary | ICD-10-CM | POA: Diagnosis present

## 2017-06-19 DIAGNOSIS — O9962 Diseases of the digestive system complicating childbirth: Secondary | ICD-10-CM | POA: Diagnosis present

## 2017-06-19 DIAGNOSIS — O99824 Streptococcus B carrier state complicating childbirth: Principal | ICD-10-CM | POA: Diagnosis present

## 2017-06-19 DIAGNOSIS — O9832 Other infections with a predominantly sexual mode of transmission complicating childbirth: Secondary | ICD-10-CM | POA: Diagnosis present

## 2017-06-19 DIAGNOSIS — O99210 Obesity complicating pregnancy, unspecified trimester: Secondary | ICD-10-CM | POA: Diagnosis present

## 2017-06-19 DIAGNOSIS — J45909 Unspecified asthma, uncomplicated: Secondary | ICD-10-CM | POA: Diagnosis present

## 2017-06-19 DIAGNOSIS — Z87891 Personal history of nicotine dependence: Secondary | ICD-10-CM | POA: Diagnosis not present

## 2017-06-19 DIAGNOSIS — O9952 Diseases of the respiratory system complicating childbirth: Secondary | ICD-10-CM | POA: Diagnosis present

## 2017-06-19 DIAGNOSIS — O26893 Other specified pregnancy related conditions, third trimester: Secondary | ICD-10-CM | POA: Diagnosis present

## 2017-06-19 DIAGNOSIS — O99213 Obesity complicating pregnancy, third trimester: Secondary | ICD-10-CM | POA: Diagnosis not present

## 2017-06-19 DIAGNOSIS — O24429 Gestational diabetes mellitus in childbirth, unspecified control: Secondary | ICD-10-CM | POA: Diagnosis not present

## 2017-06-19 DIAGNOSIS — Z3A39 39 weeks gestation of pregnancy: Secondary | ICD-10-CM | POA: Diagnosis not present

## 2017-06-19 LAB — CBC
HCT: 34 % — ABNORMAL LOW (ref 36.0–46.0)
HEMOGLOBIN: 12.1 g/dL (ref 12.0–15.0)
MCH: 26.2 pg (ref 26.0–34.0)
MCHC: 35.6 g/dL (ref 30.0–36.0)
MCV: 73.8 fL — AB (ref 78.0–100.0)
Platelets: 187 10*3/uL (ref 150–400)
RBC: 4.61 MIL/uL (ref 3.87–5.11)
RDW: 14.8 % (ref 11.5–15.5)
WBC: 12 10*3/uL — ABNORMAL HIGH (ref 4.0–10.5)

## 2017-06-19 LAB — POCT FERN TEST: POCT Fern Test: NEGATIVE

## 2017-06-19 LAB — AMNISURE RUPTURE OF MEMBRANE (ROM) NOT AT ARMC: Amnisure ROM: POSITIVE

## 2017-06-19 LAB — TYPE AND SCREEN
ABO/RH(D): A POS
Antibody Screen: NEGATIVE

## 2017-06-19 MED ORDER — EPHEDRINE 5 MG/ML INJ: 10.0000 mg | INTRAVENOUS | Status: DC | PRN

## 2017-06-19 MED ORDER — NALBUPHINE HCL 10 MG/ML IJ SOLN: 5.0000 mg | Freq: Once | INTRAMUSCULAR | Status: DC | PRN

## 2017-06-19 MED ORDER — OXYTOCIN 40 UNITS IN LACTATED RINGERS INFUSION - SIMPLE MED: 2.5000 [IU]/h | INTRAVENOUS | Status: DC

## 2017-06-19 MED ORDER — PHENYLEPHRINE 40 MCG/ML (10ML) SYRINGE FOR IV PUSH (FOR BLOOD PRESSURE SUPPORT): 80.0000 ug | PREFILLED_SYRINGE | INTRAVENOUS | Status: DC | PRN

## 2017-06-19 MED ORDER — PHENYLEPHRINE 40 MCG/ML (10ML) SYRINGE FOR IV PUSH (FOR BLOOD PRESSURE SUPPORT)
PREFILLED_SYRINGE | INTRAVENOUS | Status: AC
  Filled 2017-06-19: qty 10

## 2017-06-19 MED ORDER — KETOROLAC TROMETHAMINE 30 MG/ML IJ SOLN: 30.0000 mg | Freq: Four times a day (QID) | INTRAMUSCULAR | Status: DC | PRN

## 2017-06-19 MED ORDER — LACTATED RINGERS IV SOLN: 500.0000 mL | Freq: Once | INTRAVENOUS | Status: DC

## 2017-06-19 MED ORDER — SOD CITRATE-CITRIC ACID 500-334 MG/5ML PO SOLN: 30.0000 mL | ORAL | Status: DC | PRN

## 2017-06-19 MED ORDER — DIPHENHYDRAMINE HCL 50 MG/ML IJ SOLN: 12.5000 mg | INTRAMUSCULAR | Status: DC | PRN

## 2017-06-19 MED ORDER — OXYTOCIN 40 UNITS IN LACTATED RINGERS INFUSION - SIMPLE MED
1.0000 m[IU]/min | INTRAVENOUS | Status: DC
  Administered 2017-06-19: 4 m[IU]/min via INTRAVENOUS
  Administered 2017-06-19: 2 m[IU]/min via INTRAVENOUS

## 2017-06-19 MED ORDER — TERBUTALINE SULFATE 1 MG/ML IJ SOLN
0.2500 mg | Freq: Once | INTRAMUSCULAR | Status: DC | PRN
  Filled 2017-06-19: qty 1

## 2017-06-19 MED ORDER — OXYTOCIN BOLUS FROM INFUSION
500.0000 mL | Freq: Once | INTRAVENOUS | Status: AC
  Administered 2017-06-19: 500 mL via INTRAVENOUS

## 2017-06-19 MED ORDER — PENICILLIN G POTASSIUM 5000000 UNITS IJ SOLR
5.0000 10*6.[IU] | Freq: Once | INTRAMUSCULAR | Status: AC
  Administered 2017-06-19: 5 10*6.[IU] via INTRAVENOUS
  Filled 2017-06-19: qty 5

## 2017-06-19 MED ORDER — LIDOCAINE HCL (PF) 1 % IJ SOLN
INTRAMUSCULAR | Status: DC | PRN
  Administered 2017-06-19 (×2): 5 mL via EPIDURAL

## 2017-06-19 MED ORDER — NALOXONE HCL 2 MG/2ML IJ SOSY: 1.0000 ug/kg/h | PREFILLED_SYRINGE | INTRAVENOUS | Status: DC | PRN

## 2017-06-19 MED ORDER — LIDOCAINE HCL (PF) 1 % IJ SOLN
30.0000 mL | INTRAMUSCULAR | Status: DC | PRN
  Filled 2017-06-19: qty 30

## 2017-06-19 MED ORDER — OXYCODONE-ACETAMINOPHEN 5-325 MG PO TABS: 1.0000 | ORAL_TABLET | ORAL | Status: DC | PRN

## 2017-06-19 MED ORDER — OXYCODONE-ACETAMINOPHEN 5-325 MG PO TABS: 2.0000 | ORAL_TABLET | ORAL | Status: DC | PRN

## 2017-06-19 MED ORDER — LACTATED RINGERS IV SOLN
INTRAVENOUS | Status: DC
  Administered 2017-06-19: 21:00:00 via INTRAUTERINE
  Administered 2017-06-19: 300 mL/h via INTRAUTERINE

## 2017-06-19 MED ORDER — FENTANYL 2.5 MCG/ML BUPIVACAINE 1/10 % EPIDURAL INFUSION (WH - ANES): 14.0000 mL/h | INTRAMUSCULAR | Status: DC | PRN

## 2017-06-19 MED ORDER — NALBUPHINE HCL 10 MG/ML IJ SOLN: 5.0000 mg | INTRAMUSCULAR | Status: DC | PRN

## 2017-06-19 MED ORDER — FLEET ENEMA 7-19 GM/118ML RE ENEM: 1.0000 | ENEMA | RECTAL | Status: DC | PRN

## 2017-06-19 MED ORDER — EPHEDRINE 5 MG/ML INJ
10.0000 mg | INTRAVENOUS | Status: DC | PRN
  Filled 2017-06-19: qty 2

## 2017-06-19 MED ORDER — PENICILLIN G POT IN DEXTROSE 60000 UNIT/ML IV SOLN
3.0000 10*6.[IU] | INTRAVENOUS | Status: DC
  Administered 2017-06-19: 3 10*6.[IU] via INTRAVENOUS
  Filled 2017-06-19 (×6): qty 50

## 2017-06-19 MED ORDER — SCOPOLAMINE 1 MG/3DAYS TD PT72: 1.0000 | MEDICATED_PATCH | Freq: Once | TRANSDERMAL | Status: DC

## 2017-06-19 MED ORDER — PHENYLEPHRINE 40 MCG/ML (10ML) SYRINGE FOR IV PUSH (FOR BLOOD PRESSURE SUPPORT)
80.0000 ug | PREFILLED_SYRINGE | INTRAVENOUS | Status: DC | PRN
  Filled 2017-06-19: qty 5

## 2017-06-19 MED ORDER — IBUPROFEN 600 MG PO TABS
600.0000 mg | ORAL_TABLET | Freq: Four times a day (QID) | ORAL | Status: DC
  Administered 2017-06-20 – 2017-06-21 (×7): 600 mg via ORAL
  Filled 2017-06-19 (×8): qty 1

## 2017-06-19 MED ORDER — DIPHENHYDRAMINE HCL 25 MG PO CAPS: 25.0000 mg | ORAL_CAPSULE | ORAL | Status: DC | PRN

## 2017-06-19 MED ORDER — LACTATED RINGERS IV SOLN
500.0000 mL | INTRAVENOUS | Status: DC | PRN
  Administered 2017-06-19: 250 mL via INTRAVENOUS

## 2017-06-19 MED ORDER — LACTATED RINGERS IV SOLN: INTRAVENOUS | Status: DC

## 2017-06-19 MED ORDER — FENTANYL 2.5 MCG/ML BUPIVACAINE 1/10 % EPIDURAL INFUSION (WH - ANES)
INTRAMUSCULAR | Status: DC | PRN
  Administered 2017-06-19: 14 mL/h via EPIDURAL

## 2017-06-19 MED ORDER — FENTANYL 2.5 MCG/ML BUPIVACAINE 1/10 % EPIDURAL INFUSION (WH - ANES)
INTRAMUSCULAR | Status: AC
  Filled 2017-06-19: qty 100

## 2017-06-19 MED ORDER — ONDANSETRON HCL 4 MG/2ML IJ SOLN: 4.0000 mg | Freq: Four times a day (QID) | INTRAMUSCULAR | Status: DC | PRN

## 2017-06-19 MED ORDER — OXYTOCIN 40 UNITS IN LACTATED RINGERS INFUSION - SIMPLE MED
2.5000 [IU]/h | INTRAVENOUS | Status: DC
  Filled 2017-06-19: qty 1000

## 2017-06-19 MED ORDER — ONDANSETRON HCL 4 MG/2ML IJ SOLN: 4.0000 mg | Freq: Three times a day (TID) | INTRAMUSCULAR | Status: DC | PRN

## 2017-06-19 MED ORDER — ACETAMINOPHEN 325 MG PO TABS: 650.0000 mg | ORAL_TABLET | ORAL | Status: DC | PRN

## 2017-06-19 MED ORDER — NALOXONE HCL 0.4 MG/ML IJ SOLN
0.4000 mg | INTRAMUSCULAR | Status: DC | PRN
Start: 2017-06-19 — End: 2017-06-19

## 2017-06-19 MED ORDER — FENTANYL CITRATE (PF) 100 MCG/2ML IJ SOLN
12.5000 ug | INTRAMUSCULAR | Status: DC | PRN
Start: 2017-06-19 — End: 2017-06-20

## 2017-06-19 MED ORDER — SODIUM CHLORIDE 0.9% FLUSH: 3.0000 mL | INTRAVENOUS | Status: DC | PRN

## 2017-06-19 NOTE — Progress Notes (Addendum)
[name] MRN: [id number]  Subjective: -Care assumed of 36 y.o. G3P1 at [redacted]w[redacted]d who presents for SROM at 0830. Patient is under the care of [facility] and pregnancy and medical history significant for GBS, HSV, Obesity, asthma, and bariatric surgery. In room to meet acquaintance of patient and family.  Patient denies rectal pressure and reports comfort with epidural.   Objective: BP 110/67   Pulse 68   Temp 97.7 F (36.5 C) (Oral)   Resp 17   Wt 128.8 kg (284 lb)   LMP 09/19/2016 (Exact Date)   SpO2 98%   BMI 47.26 kg/m  No intake/output data recorded. No intake/output data recorded.  Fetal Monitoring: FHT: 120 bpm, Mod Var, -Decels, +Accels UC: Occasional    Physical Exam: General appearance: alert, well appearing, and in no distress. Chest: normal rate and regular rhythm.  clear to auscultation, no wheezes, rales or rhonchi, symmetric air entry. Abdominal exam: Soft RT, NT, Gravid-Appears LGA. Extremities: Edema Skin exam: Warm Dry  Vaginal Exam: SVE:   Dilation: 4 Effacement (%): 90 Station: 0 Exam by:: Clyde Lundborg, CNM Membranes:SROM x 12 hrs Internal Monitors: FSE in place, IUPC inserted w/o issues  Augmentation/Induction: Pitocin:Restarted at 36mUn/min Cytotec: None  Assessment:  IUP at 39wks Cat I FT  Labor Augmentation GBS Positive  Plan: -Restart pitocin at 12mUn/min -Restart amnioinfusion if appropriate (i.e. Recurrent Variable decelerations) -Continue other mgmt as ordered -Will reassess 1 hour after adequate contractions documented or prn  Valma Cava, CNM 06/19/2017, 8:07 PM  Addendum ([id number]) Nurse call reports patient with increased rectal pressure.  Dilation: 10 Dilation Complete Date: 06/19/17 Dilation Complete Time: 2138 Effacement (%): 100 Cervical Position: Middle Station: 0 Presentation: Vertex Exam by:: Minus Liberty, RN  IUP at 39wks Cat II FT Labor Augmentation  Instructed to place on left side with peanut to  allow patient to labor down and to promote resolution of cat II ft  Anticipate SVD  Cherre Robins MSN, CNM 06/19/2017 10:00 PM

## 2017-06-19 NOTE — Anesthesia Procedure Notes (Signed)
Epidural Patient location during procedure: OB Start time: 06/19/2017 5:45 PM End time: 06/19/2017 6:00 PM  Staffing Anesthesiologist: Mars Scheaffer  Preanesthetic Checklist Completed: patient identified, site marked, surgical consent, pre-op evaluation, timeout performed, IV checked, risks and benefits discussed and monitors and equipment checked  Epidural Patient position: sitting Prep: site prepped and draped and DuraPrep Patient monitoring: continuous pulse ox and blood pressure Approach: midline Location: L2-L3 Injection technique: LOR air  Needle:  Needle type: Tuohy  Needle gauge: 17 G Needle length: 9 cm and 9 Needle insertion depth: 9 cm Catheter type: closed end flexible Catheter size: 19 Gauge Catheter at skin depth: 10 and 16 cm Test dose: negative  Assessment Events: blood not aspirated, injection not painful, no injection resistance, negative IV test and no paresthesia

## 2017-06-19 NOTE — MAU Note (Addendum)
LOF, clear, since 820 this am Lower abdominal and back cramping--rating pain 3/10 +FM States last Wednesday was 3cm in the office Denies any complications with this pregnancy GBS +

## 2017-06-19 NOTE — Progress Notes (Signed)
Pt with pain from contractions BP 110/74   Pulse 66   Temp 98.2 F (36.8 C) (Oral)   Resp 18   Wt 128.8 kg (284 lb)   LMP 09/19/2016 (Exact Date)   SpO2 98%   BMI 47.26 kg/m  FHTS 135-140 with moderate variables.  Overall fetal tracing reassureing toco unable to trace contractions bc of body habitus 4/90/0 IUPC placed will decrease pitocin if needed ans start amnioinfusion if needed Epidural for pain Monitor closely

## 2017-06-19 NOTE — Anesthesia Pain Management Evaluation Note (Signed)
  CRNA Pain Management Visit Note  Patient: Karina [name], 36 y.o., female  "Hello I am a member of the anesthesia team at [facility]. We have an anesthesia team available at all times to provide care throughout the hospital, including epidural management and anesthesia for C-section. I don't know your plan for the delivery whether it a natural birth, water birth, IV sedation, nitrous supplementation, doula or epidural, but we want to meet your pain goals."   1.Was your pain managed to your expectations on prior hospitalizations?   Yes   2.What is your expectation for pain management during this hospitalization?     IV pain meds and Nitrous Oxide  3.How can we help you reach that goal? Epidural if desired. Plans on nitrous and IV pain medication first.  Record the patient's initial score and the patient's pain goal.   Pain: 1  Pain Goal: 5 [facility] wants you to be able to say your pain was always managed very well.  [name] 06/19/2017

## 2017-06-19 NOTE — H&P (Addendum)
Karina [name] Karina [name] is a 36 y.o. female presenting for SROM. OB History    Gravida Para Term Preterm AB Living   SAB TAB Ectopic Multiple Live Births   1       1     Past Medical History:  Diagnosis Date  . Anemia    takes iron daily  . Asthma    albuterol as needed  . Chronic bronchitis   . Diabetes mellitus   . Edema extremities    hx of edema in lower ext. takes lasix PRN last dose few weeks ago  . Gallbladder disease   . GERD (gastroesophageal reflux disease)    nexium   . Hemoglobin C trait (HCC)   . History of chicken pox   . HSV (herpes simplex virus) anogenital infection   . Neuropathy   . Pregnant   . Sleep apnea    cpap   Past Surgical History:  Procedure Laterality Date  . CARPAL TUNNEL RELEASE  2008, 2010  . CHOLECYSTECTOMY  01/11/2012   Procedure: LAPAROSCOPIC CHOLECYSTECTOMY WITH INTRAOPERATIVE CHOLANGIOGRAM;  Surgeon: Cherylynn Ridges, MD;  Location: [facility] OR;  Service: General;  Laterality: N/A;  . LAPAROSCOPIC GASTRIC BANDING    . NO PAST SURGERIES     Family History: family history includes Anesthesia problems in her mother; Asthma in her maternal grandmother and sister; Cervical cancer in her maternal aunt; Diabetes in her sister; Heart disease in her other; Hyperlipidemia in her other; Hypertension in her other; Kidney disease in her other; Liver cancer in her paternal grandfather and paternal uncle; Ovarian cancer in her maternal aunt; Prostate cancer in her maternal uncle and paternal grandfather; Stroke in her other; Thyroid disease in her other. Social History:  reports that she quit smoking about 21 years ago. Her smoking use included Cigars and Cigarettes. She has a 0.25 pack-year smoking history. She has never used smokeless tobacco. She reports that she drinks alcohol. She reports that she does not use drugs.     Maternal Diabetes: No Genetic Screening: Normal Maternal Ultrasounds/Referrals: Abnormal:  Findings:   Other: Fetal Ultrasounds  or other Referrals:  Other:  Maternal Substance Abuse:  No Significant Maternal Medications:  Meds include: Other:  Significant Maternal Lab Results:  None Other Comments:  maternal [place] sig for right hydrosalpinx.  sig meds include flexeril  ROS History Dilation: 2.5 Effacement (%): 50 Station: -3 Exam by:: Dr. Normand Sloop Blood pressure 125/61, pulse 73, temperature 98.3 F (36.8 C), temperature source Oral, resp. rate 18, weight 128.8 kg (284 lb), last menstrual period 09/19/2016, SpO2 98 %. Exam Physical Exam  Physical Examination: General appearance - alert, well appearing, and in no distress Chest - clear to auscultation, no wheezes, rales or rhonchi, symmetric air entry Heart - normal rate and regular rhythm Abdomen - soft, nontender, nondistended, no masses or organomegaly Pelvic - normal external genitalia, vulva, vagina, cervix, uterus and adnexa, vaginal pooling Extremities - Homan's sign negative bilaterally Prenatal labs: ABO, Rh: --/--/A POS (10/10 1145) Antibody: NEG (10/10 1145) Rubella: @ RPR: Nonreactive (02/23 0000)  HBsAg: Negative (02/23 0000)  HIV: Non-reactive (02/23 0000)  GBS: Positive (02/23 0000)   Assessment/Plan: Term with SROM Will do pitocin augmentation PCNG Pt had TDAP and flu vaccine Anticipate SVD No HSV lesions seen on SSE  Cat 1 tracing with occ contractions Karina [name] A 06/19/2017, 1:09 PM

## 2017-06-19 NOTE — Anesthesia Preprocedure Evaluation (Signed)
Anesthesia Evaluation  Patient identified by MRN, date of birth, ID band Patient awake    Reviewed: Allergy & Precautions, H&P , NPO status , Patient's Chart, lab work & pertinent test results, reviewed documented beta blocker date and time   Airway Mallampati: I  TM Distance: >3 FB Neck ROM: full    Dental no notable dental hx.    Pulmonary neg pulmonary ROS, sleep apnea , former smoker,    Pulmonary exam normal breath sounds clear to auscultation       Cardiovascular negative cardio ROS Normal cardiovascular exam Rhythm:regular Rate:Normal     Neuro/Psych negative neurological ROS  negative psych ROS   GI/Hepatic negative GI ROS, Neg liver ROS, GERD  ,  Endo/Other  negative endocrine ROSdiabetes, Well Controlled, GestationalMorbid obesity  Renal/GU negative Renal ROS  negative genitourinary   Musculoskeletal   Abdominal   Peds  Hematology negative hematology ROS (+) anemia ,   Anesthesia Other Findings   Reproductive/Obstetrics (+) Pregnancy                             Anesthesia Physical Anesthesia Plan  ASA: III  Anesthesia Plan: Epidural   Post-op Pain Management:    Induction:   PONV Risk Score and Plan:   Airway Management Planned:   Additional Equipment:   Intra-op Plan:   Post-operative Plan:   Informed Consent: I have reviewed the patients History and Physical, chart, labs and discussed the procedure including the risks, benefits and alternatives for the proposed anesthesia with the patient or authorized representative who has indicated his/her understanding and acceptance.   Dental Advisory Given  Plan Discussed with:   Anesthesia Plan Comments: (Labs checked- platelets confirmed with RN in room. Fetal heart tracing, per RN, reported to be stable enough for sitting procedure. Discussed epidural, and patient consents to the procedure:  included risk of possible  headache,backache, failed block, allergic reaction, and nerve injury. This patient was asked if she had any questions or concerns before the procedure started.)        Anesthesia Quick Evaluation

## 2017-06-20 LAB — CBC
HCT: 29.7 % — ABNORMAL LOW (ref 36.0–46.0)
Hemoglobin: 10.5 g/dL — ABNORMAL LOW (ref 12.0–15.0)
MCH: 26 pg (ref 26.0–34.0)
MCHC: 35.4 g/dL (ref 30.0–36.0)
MCV: 73.5 fL — AB (ref 78.0–100.0)
PLATELETS: 179 10*3/uL (ref 150–400)
RBC: 4.04 MIL/uL (ref 3.87–5.11)
RDW: 14.8 % (ref 11.5–15.5)
WBC: 13.3 10*3/uL — ABNORMAL HIGH (ref 4.0–10.5)

## 2017-06-20 LAB — RPR: RPR: NONREACTIVE

## 2017-06-20 MED ORDER — ONDANSETRON HCL 4 MG/2ML IJ SOLN: 4.0000 mg | INTRAMUSCULAR | Status: DC | PRN

## 2017-06-20 MED ORDER — BENZOCAINE-MENTHOL 20-0.5 % EX AERO: 1.0000 "application " | INHALATION_SPRAY | CUTANEOUS | Status: DC | PRN

## 2017-06-20 MED ORDER — COCONUT OIL OIL: 1.0000 "application " | TOPICAL_OIL | Status: DC | PRN

## 2017-06-20 MED ORDER — LACTATED RINGERS IV SOLN: 100.0000 mL/h | INTRAVENOUS | Status: DC

## 2017-06-20 MED ORDER — DIPHENHYDRAMINE HCL 25 MG PO CAPS: 25.0000 mg | ORAL_CAPSULE | Freq: Four times a day (QID) | ORAL | Status: DC | PRN

## 2017-06-20 MED ORDER — PNEUMOCOCCAL VAC POLYVALENT 25 MCG/0.5ML IJ INJ
0.5000 mL | INJECTION | INTRAMUSCULAR | Status: DC
  Filled 2017-06-20: qty 0.5

## 2017-06-20 MED ORDER — PRENATAL MULTIVITAMIN CH
1.0000 | ORAL_TABLET | Freq: Every day | ORAL | Status: DC
  Administered 2017-06-20 – 2017-06-21 (×2): 1 via ORAL
  Filled 2017-06-20 (×2): qty 1

## 2017-06-20 MED ORDER — SENNOSIDES-DOCUSATE SODIUM 8.6-50 MG PO TABS
2.0000 | ORAL_TABLET | ORAL | Status: DC
  Administered 2017-06-20 (×2): 2 via ORAL
  Filled 2017-06-20 (×2): qty 2

## 2017-06-20 MED ORDER — MEPERIDINE HCL 25 MG/ML IJ SOLN: 6.2500 mg | INTRAMUSCULAR | Status: DC | PRN

## 2017-06-20 MED ORDER — TETANUS-DIPHTH-ACELL PERTUSSIS 5-2.5-18.5 LF-MCG/0.5 IM SUSP: 0.5000 mL | Freq: Once | INTRAMUSCULAR | Status: DC

## 2017-06-20 MED ORDER — ZOLPIDEM TARTRATE 5 MG PO TABS: 5.0000 mg | ORAL_TABLET | Freq: Every evening | ORAL | Status: DC | PRN

## 2017-06-20 MED ORDER — VALACYCLOVIR HCL 500 MG PO TABS
500.0000 mg | ORAL_TABLET | Freq: Every day | ORAL | Status: DC
  Administered 2017-06-20 – 2017-06-21 (×2): 500 mg via ORAL
  Filled 2017-06-20 (×2): qty 1

## 2017-06-20 MED ORDER — ONDANSETRON HCL 4 MG PO TABS: 4.0000 mg | ORAL_TABLET | ORAL | Status: DC | PRN

## 2017-06-20 MED ORDER — SOD CITRATE-CITRIC ACID 500-334 MG/5ML PO SOLN: 30.0000 mL | Freq: Once | ORAL | Status: DC

## 2017-06-20 MED ORDER — SIMETHICONE 80 MG PO CHEW: 80.0000 mg | CHEWABLE_TABLET | ORAL | Status: DC | PRN

## 2017-06-20 MED ORDER — DIBUCAINE 1 % RE OINT: 1.0000 "application " | TOPICAL_OINTMENT | RECTAL | Status: DC | PRN

## 2017-06-20 MED ORDER — WITCH HAZEL-GLYCERIN EX PADS: 1.0000 "application " | MEDICATED_PAD | CUTANEOUS | Status: DC | PRN

## 2017-06-20 MED ORDER — ACETAMINOPHEN 325 MG PO TABS
650.0000 mg | ORAL_TABLET | ORAL | Status: DC | PRN
  Administered 2017-06-20 – 2017-06-21 (×5): 650 mg via ORAL
  Filled 2017-06-20 (×5): qty 2

## 2017-06-20 NOTE — Progress Notes (Signed)
Karina [name]  Post Partum Day 1:S/P SVD  Subjective: Patient up ad lib, denies syncope or dizziness. Reports consuming regular diet without issues and denies N/V. Denies issues with urination and reports bleeding is "improving a lot."  Patient is breastfeeding and reports going well as infant has latched without issues.  No desires for postpartum contraception.  Pain is being appropriately managed with use of motrin.  Objective: Vitals:   06/20/17 0000 06/20/17 0030 06/20/17 0130 06/20/17 0531  BP: 94/68 (!) 103/38 (!) 114/39 (!) 97/43  Pulse: 61 72 66 67  Resp: Temp:  99.1 F (37.3 C) 98.1 F (36.7 C) 97.7 F (36.5 C)  TempSrc:  Oral Oral Oral  SpO2:  97% 98% 97%  Weight:      Height:        Recent Labs  06/19/17 1145 06/20/17 0538  HGB 12.1 10.5*  HCT 34.0* 29.7*    Physical Exam:  General: alert, cooperative and no distress Mood/Affect: Bright/Bright Lungs: clear to auscultation, no wheezes, rales or rhonchi, symmetric air entry.  Heart: normal rate and regular rhythm. Breast: not examined. Abdomen:  + bowel sounds, Soft, Tender at Fundus Uterine Fundus: firm, U/-1 Lochia: appropriate Laceration: None Skin: Warm, Dry DVT Evaluation: Significant calf/ankle edema.  Assessment S/P Vaginal Delivery-Day 1 Normal Involution BreastFeeding  Plan: Discussed Discharge Encouraged ambulation Will remove IV as appropriate Continue current care Dr. Lance Morin to be updated on patient status   Cherre Robins, MSN, CNM 06/20/2017, 6:20 AM

## 2017-06-20 NOTE — Anesthesia Postprocedure Evaluation (Signed)
Anesthesia Post Note  Patient: Karina [name]  Procedure(s) Performed: AN AD HOC LABOR EPIDURAL     Patient location during evaluation: Mother Baby Anesthesia Type: Epidural Level of consciousness: awake, awake and alert, oriented and patient cooperative Pain management: pain level controlled Vital Signs Assessment: post-procedure vital signs reviewed and stable Respiratory status: spontaneous breathing, nonlabored ventilation and respiratory function stable Cardiovascular status: stable Postop Assessment: no headache, no backache, no apparent nausea or vomiting and patient able to bend at knees Anesthetic complications: no    Last Vitals:  Vitals:   06/20/17 0130 06/20/17 0531  BP: (!) 114/39 (!) 97/43  Pulse: 66 67  Resp: 18 18  Temp: 36.7 C 36.5 C  SpO2: 98% 97%    Last Pain:  Vitals:   06/20/17 0836  TempSrc:   PainSc: 5    Pain Goal:                 [name]

## 2017-06-20 NOTE — Lactation Note (Signed)
This note was copied from a baby's chart. Lactation Consultation Note  Patient Name: Karina [name] Date: 06/20/2017 Reason for consult: Follow-up assessment  Baby 18 hrs old, and Mom has breastfed X 8 already, baby's output good.  Mom standing up with baby in football hold.  Baby latched well, no discomfort per Mom.  Encouraged STS and frequent feedings on cue.   Mom has history of gastric band surgery, AMA, GDM, and PCOS.  Mom describes having a good milk supply until about 7 months, when baby just started not wanting to breastfeed.  Talked about when adding solid foods, to offer the breast first, so baby doesn't get too full to breastfeed. Mom encouraged to call for assistance prn.      Consult Status Consult Status: Follow-up Date: 06/21/17 Follow-up type: In-patient    Karina [name] 06/20/2017, 4:45 PM

## 2017-06-20 NOTE — Lactation Note (Addendum)
This note was copied from a baby's chart. Lactation Consultation Note Karina [name]'s 2nd child. BF her 1st child for 8 months. Didn't supplement until 56 months old. Karina [name] has tubular breast, large areola, flat nipples. Very compressible. Colostrum easily expressed.  Karina [name] stated she didn't have any trouble BF her daughter, this baby has done well so far. Baby is 6 hrs old. Discussed newborn behavior, feeding habits, STS, I&O, cluster feeding, supply and demand.  Karina [name] encouraged to feed baby 8-12 times/24 hours and with feeding cues.  Encouraged to call for assistance or questions.  [facility]/LC brochure given w/resources, support groups and LC services. Karina [name] has WIC.  Karina [name] speaks fluent [place]. Patient Name: Karina [name] Today's Date: 06/20/2017 Reason for consult: Initial assessment   Maternal Data    Feeding Feeding Type: Breast Fed Length of feed: 20 min  LATCH Score       Type of Nipple: Flat  Comfort (Breast/Nipple): Soft / non-tender        Interventions Interventions: Breast feeding basics reviewed;Breast compression;Breast massage;Hand express  Lactation Tools Discussed/Used WIC Program: Yes   Consult Status Consult Status: Follow-up Date: 06/21/17 Follow-up type: In-patient    Charyl Dancer 06/20/2017, 5:28 AM

## 2017-06-21 MED ORDER — IBUPROFEN 600 MG PO TABS: 600.0000 mg | ORAL_TABLET | Freq: Four times a day (QID) | ORAL | 2 refills | Status: DC | PRN

## 2017-06-21 MED ORDER — OXYCODONE HCL 5 MG PO TABS: 5.0000 mg | ORAL_TABLET | ORAL | 0 refills | Status: DC | PRN

## 2017-06-21 NOTE — Discharge Instructions (Signed)

## 2017-06-21 NOTE — Discharge Summary (Signed)
OB Discharge Summary     Patient Name: Karina [name] DOB: 16-Feb-1981 MRN: [id number]  Date of admission: 06/19/2017 Delivering MD: Gerrit Heck   Date of discharge: 06/21/2017  Admitting diagnosis: WATER BROKE, IUP at 39 weeks Intrauterine pregnancy: [redacted]w[redacted]d     Secondary diagnosis:  Principal Problem:   SVD (spontaneous vaginal delivery)  Additional problems: None     Discharge diagnosis: Term Pregnancy Delivered                                                                                                Post partum procedures:None  Augmentation: Pitocin  Complications: None  Hospital course:  Onset of Labor With Vaginal Delivery     36 y.o. yo G3P2011 at [redacted]w[redacted]d was admitted in Latent Labor on 06/19/2017. Patient had an uncomplicated labor course as follows:  Membrane Rupture Time/Date: 8:00 AM ,06/19/2017   Intrapartum Procedures: Episiotomy: None [1]                                         Lacerations:  None [1]  Patient had a delivery of a Viable infant. 06/19/2017  Information for the patient's newborn:  [name], Girl Marian Sorrow [[id number]]  Delivery Method: Vaginal, Spontaneous Delivery (Filed from Delivery Summary)    Pateint had an uncomplicated postpartum course.  She is ambulating, tolerating a regular diet, passing flatus, and urinating well. Patient is discharged home in stable condition on 06/21/17.  She was having moderate cramping despite Motrin and Tylenol---Rx Oxycodone #12 for limited use.   Physical exam  Vitals:   06/20/17 0531 06/20/17 1027 06/20/17 1844 06/21/17 0605  BP: (!) 97/43 (!) 123/54 (!) 101/47 121/65  Pulse: 67 65 62 60  Resp: 18   18  Temp: 97.7 F (36.5 C) 98.4 F (36.9 C) 97.9 F (36.6 C) 97.9 F (36.6 C)  TempSrc: Oral   Tympanic  SpO2: 97%     Weight:      Height:       General: alert Lochia: appropriate Uterine Fundus: firm Incision: Intact perineum DVT Evaluation: No evidence of DVT seen on physical  exam. Negative Homan's sign. Labs: CBC Latest Ref Rng & Units 06/20/2017 06/19/2017 11/04/2016  WBC 4.0 - 10.5 K/uL 13.3(H) 12.0(H) 11.3(H)  Hemoglobin 12.0 - 15.0 g/dL 10.5(L) 12.1 11.5(L)  Hematocrit 36.0 - 46.0 % 29.7(L) 34.0(L) 32.2(L)  Platelets 150 - 400 K/uL 179 187 215   CMP Latest Ref Rng & Units 03/28/2014  Glucose 70 - 99 mg/dL 75  BUN 6 - 23 mg/dL 6  Creatinine 7.82 - 9.56 mg/dL 2.13  Sodium 086 - 578 mEq/L 137  Potassium 3.7 - 5.3 mEq/L 4.1  Chloride 96 - 112 mEq/L 103  CO2 19 - 32 mEq/L -  Calcium 8.4 - 10.5 mg/dL -  Total Protein 6.0 - 8.3 g/dL -  Total Bilirubin 0.3 - 1.2 mg/dL -  Alkaline Phos 39 - 469 U/L -  AST 0 - 37 U/L -  ALT 0 -  35 U/L -    Discharge instruction: per After Visit Summary and "Baby and Me Booklet".  After visit meds:  Allergies as of 06/21/2017   No Known Allergies     Medication List    STOP taking these medications   valACYclovir 500 MG tablet Commonly known as:  VALTREX     TAKE these medications   acetaminophen 500 MG tablet Commonly known as:  TYLENOL Take 500 mg by mouth every 6 (six) hours as needed for mild pain, moderate pain or headache.   albuterol 108 (90 Base) MCG/ACT inhaler Commonly known as:  PROVENTIL HFA;VENTOLIN HFA Inhale 1-2 puffs into the lungs every 6 (six) hours as needed for wheezing or shortness of breath.   calcium carbonate 500 MG chewable tablet Commonly known as:  TUMS - dosed in mg elemental calcium Chew 1 tablet by mouth every 3 (three) hours as needed for indigestion or heartburn.   famotidine 20 MG tablet Commonly known as:  PEPCID Take 20 mg by mouth 2 (two) times daily.   ibuprofen 600 MG tablet Commonly known as:  ADVIL,MOTRIN Take 1 tablet (600 mg total) by mouth every 6 (six) hours as needed.   oxyCODONE 5 MG immediate release tablet Commonly known as:  ROXICODONE Take 1 tablet (5 mg total) by mouth every 4 (four) hours as needed for severe pain.   Prenatal Vitamins 0.8 MG  tablet Take 1 tablet by mouth daily.       Diet: routine diet  Activity: Advance as tolerated. Pelvic rest for 6 weeks.   Outpatient follow up:6 weeks--plan pelvic US at that visit to f/u on hydrosalpinx Follow up Appt:No future appointments. Follow up Visit:No Follow-up on file.  Postpartum contraception: Declines at present.  Newborn Data: Live born female  Birth Weight: 6 lb 8.4 oz (2960 g) APGAR: 9, 9  Newborn Delivery   Birth date/time:  06/19/2017 22:34:00 Delivery type:  Vaginal, Spontaneous Delivery      Baby Feeding: Breast Disposition:home with mother   06/21/2017 Nigel Bridgeman, CNM

## 2017-06-21 NOTE — Lactation Note (Signed)
This note was copied from a baby's chart. Lactation Consultation Note: Experienced BF mom reports baby has been nursing well. LS by RN 9. Reports breasts are beginning to feel a little fuller this morning. Plans to get pump from [facility].No questions at present. Reviewed our phone number to call with questions/concerns  Patient Name: Karina [name] Today's Date: 06/21/2017 Reason for consult: Follow-up assessment   Maternal Data Formula Feeding for Exclusion: No Has patient been taught Hand Expression?: Yes Does the patient have breastfeeding experience prior to this delivery?: Yes  Feeding    LATCH Score                   Interventions    Lactation Tools Discussed/Used WIC Program: Yes   Consult Status Consult Status: Complete    Karina [name] 06/21/2017, 8:46 AM

## 2017-06-21 NOTE — Lactation Note (Signed)
This note was copied from a baby's chart. Lactation Consultation Note  Patient Name: Karina [name] [id number] Date: 06/21/2017 Reason for consult: Follow-up assessment  Baby 37 hours old. Mom given [facility] DEBP.   Maternal Data Formula Feeding for Exclusion: No Has patient been taught Hand Expression?: Yes Does the patient have breastfeeding experience prior to this delivery?: Yes  Feeding    LATCH Score                   Interventions    Lactation Tools Discussed/Used WIC Program: Yes   Consult Status Consult Status: Complete    Sherlyn Hay 06/21/2017, 12:01 PM

## 2017-06-26 ENCOUNTER — Inpatient Hospital Stay (HOSPITAL_COMMUNITY): Admission: RE | Admit: 2017-06-26 | Payer: 59 | Source: Ambulatory Visit

## 2017-06-26 DIAGNOSIS — K909 Intestinal malabsorption, unspecified: Secondary | ICD-10-CM | POA: Diagnosis not present

## 2017-06-26 DIAGNOSIS — R7303 Prediabetes: Secondary | ICD-10-CM | POA: Diagnosis not present

## 2017-06-26 DIAGNOSIS — Z6841 Body Mass Index (BMI) 40.0 and over, adult: Secondary | ICD-10-CM | POA: Diagnosis not present

## 2017-06-26 DIAGNOSIS — J4520 Mild intermittent asthma, uncomplicated: Secondary | ICD-10-CM | POA: Diagnosis not present

## 2017-06-26 DIAGNOSIS — Z0000 Encounter for general adult medical examination without abnormal findings: Secondary | ICD-10-CM | POA: Diagnosis not present

## 2017-06-26 DIAGNOSIS — D508 Other iron deficiency anemias: Secondary | ICD-10-CM | POA: Diagnosis not present

## 2017-06-28 DIAGNOSIS — Z0000 Encounter for general adult medical examination without abnormal findings: Secondary | ICD-10-CM | POA: Diagnosis not present

## 2017-06-28 DIAGNOSIS — R7303 Prediabetes: Secondary | ICD-10-CM | POA: Diagnosis not present

## 2017-07-11 DIAGNOSIS — O8612 Endometritis following delivery: Secondary | ICD-10-CM | POA: Diagnosis not present

## 2017-07-11 DIAGNOSIS — R102 Pelvic and perineal pain: Secondary | ICD-10-CM | POA: Diagnosis not present

## 2017-07-11 DIAGNOSIS — R5383 Other fatigue: Secondary | ICD-10-CM | POA: Diagnosis not present

## 2017-07-11 DIAGNOSIS — R51 Headache: Secondary | ICD-10-CM | POA: Diagnosis not present

## 2017-07-11 DIAGNOSIS — N898 Other specified noninflammatory disorders of vagina: Secondary | ICD-10-CM | POA: Diagnosis not present

## 2017-07-11 DIAGNOSIS — L299 Pruritus, unspecified: Secondary | ICD-10-CM | POA: Diagnosis not present

## 2017-07-11 DIAGNOSIS — Z862 Personal history of diseases of the blood and blood-forming organs and certain disorders involving the immune mechanism: Secondary | ICD-10-CM | POA: Diagnosis not present

## 2017-07-17 DIAGNOSIS — R42 Dizziness and giddiness: Secondary | ICD-10-CM | POA: Diagnosis not present

## 2017-07-17 DIAGNOSIS — N898 Other specified noninflammatory disorders of vagina: Secondary | ICD-10-CM | POA: Diagnosis not present

## 2017-07-18 DIAGNOSIS — Z0000 Encounter for general adult medical examination without abnormal findings: Secondary | ICD-10-CM | POA: Diagnosis not present

## 2017-07-30 DIAGNOSIS — F53 Postpartum depression: Secondary | ICD-10-CM | POA: Diagnosis not present

## 2017-07-30 DIAGNOSIS — N7011 Chronic salpingitis: Secondary | ICD-10-CM | POA: Diagnosis not present

## 2017-08-06 DIAGNOSIS — N938 Other specified abnormal uterine and vaginal bleeding: Secondary | ICD-10-CM | POA: Diagnosis not present

## 2017-08-06 DIAGNOSIS — R102 Pelvic and perineal pain: Secondary | ICD-10-CM | POA: Diagnosis not present

## 2017-08-08 ENCOUNTER — Other Ambulatory Visit: Payer: Self-pay | Admitting: Obstetrics and Gynecology

## 2017-08-09 ENCOUNTER — Encounter (HOSPITAL_COMMUNITY)
Admission: RE | Admit: 2017-08-09 | Discharge: 2017-08-09 | Disposition: A | Discharge status: Home / Self Care | Payer: 59 | Source: Ambulatory Visit | Attending: Obstetrics and Gynecology | Admitting: Obstetrics and Gynecology

## 2017-08-09 ENCOUNTER — Encounter (HOSPITAL_COMMUNITY): Payer: Self-pay

## 2017-08-09 ENCOUNTER — Other Ambulatory Visit: Payer: Self-pay

## 2017-08-09 DIAGNOSIS — N838 Other noninflammatory disorders of ovary, fallopian tube and broad ligament: Secondary | ICD-10-CM | POA: Insufficient documentation

## 2017-08-09 DIAGNOSIS — N7011 Chronic salpingitis: Secondary | ICD-10-CM | POA: Diagnosis not present

## 2017-08-09 DIAGNOSIS — Z01818 Encounter for other preprocedural examination: Secondary | ICD-10-CM | POA: Insufficient documentation

## 2017-08-09 HISTORY — DX: Prediabetes: R73.03

## 2017-08-09 HISTORY — DX: Anxiety disorder, unspecified: F41.9

## 2017-08-09 LAB — BASIC METABOLIC PANEL
ANION GAP: 5 (ref 5–15)
BUN: 12 mg/dL (ref 6–20)
CALCIUM: 8.6 mg/dL — AB (ref 8.9–10.3)
CO2: 29 mmol/L (ref 22–32)
CREATININE: 0.73 mg/dL (ref 0.44–1.00)
Chloride: 103 mmol/L (ref 101–111)
GFR calc Af Amer: >60 mL/min (ref >60)
GLUCOSE: 94 mg/dL (ref 65–99)
Potassium: 4.4 mmol/L (ref 3.5–5.1)
Sodium: 137 mmol/L (ref 135–145)

## 2017-08-09 LAB — CBC
HCT: 34.9 % — ABNORMAL LOW (ref 36.0–46.0)
Hemoglobin: 11.6 g/dL — ABNORMAL LOW (ref 12.0–15.0)
MCH: 25.1 pg — ABNORMAL LOW (ref 26.0–34.0)
MCHC: 33.2 g/dL (ref 30.0–36.0)
MCV: 75.5 fL — AB (ref 78.0–100.0)
Platelets: 321 10*3/uL (ref 150–400)
RBC: 4.62 MIL/uL (ref 3.87–5.11)
RDW: 15.2 % (ref 11.5–15.5)
WBC: 9.8 10*3/uL (ref 4.0–10.5)

## 2017-08-09 NOTE — Patient Instructions (Addendum)
Your procedure is scheduled on:  Wednesday, Dec 5  [facility]  11 am  Pick up the phone at the desk and dial [phone].  Call this number if you have problems the morning of surgery: [phone].  Remember: Do NOT eat food after Midnight Tuesday  Do NOT drink clear liquids (including water) after: 6:30 am Wednesday  Take these medicines the morning of surgery with a SIP OF WATER:  pepcid  Bring albuterol inhaler with you on day of surgery.   Stop herbal medications and supplements at this time.  Do NOT wear jewelry (body piercing), metal hair clips/bobby pins, make-up, or nail polish. Do NOT wear lotions, powders, or perfumes.  You may wear deoderant. Do NOT shave for 48 hours prior to surgery. Do NOT bring valuables to the hospital.  Have a responsible adult drive you home and stay with you for 24 hours after your procedure.  Home with Husband Kandis MannanOmar cell [phone].

## 2017-08-09 NOTE — H&P (Signed)
Karina [name] is a 36 y.o.  female, P: 2-0-1-2 who presents for removal of her right fallopian tube because of persistent fluid in the same that is believed to be the cause of persistent, daily right sided pelvic pain.  The patient had a vaginal delivery on October 10,2018 but reports that before and during her pregnancy she experienced a crampy right sided pain that would on occasion become sharp.  The pain persists and is  made worse by climbing stairs, going from a sitting to standing position and with intercourse.  She denies any changes in her bowel or bladder function, has no history of renal stones and since she is breastfeeding, takes very little for pain. The patient gives a history of knowing about this tubal fluid since she underwent an infertility work-up several years ago and was found to have a blocked right tube.  However,  it was not until this past year and a half that she's had symptoms.  A pelvic ultrasound at pre op visit that showed: anteverted uterus: 6.08 x 6.66 x 5.23 cm; endometrium: 3.16 mm; left ovary: 3.19 cm and right ovary: 4.83 cm with a right adnexal hydrosalpinx vs tubal cyst measuring: 3.8 x 2.1 x 2.8 cm.  Given the persistence of this adnexal structure and accompanying pain,  the patient has decided to proceed with removal of her right fallopian tube.   Past Medical History  OB History: G:3;   P: 2-0-1-2;  2 SVB 2015 and 2018  GYN History: menarche: 36 YO    LMP: 07/31/17    Contraception: none; Has a history of HSV 1 & 2;   Denies history of abnormal PAP smear.  Last PAP smear: 2015-normal  Medical History: Hirsute, Fatty Liver Disease, Anemia, Polycystic Ovarian Syndrome, Anxiety, Hemoglobin C Trait  and Asthma  Surgical History: 2008 Right Carpal Tunnel Release; 2010 Left Carpal Tunnel Release; 2012 Cholecystectomy and Lap Band Procedure Denies history of blood transfusions.  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   ROS: Admits to glasses, occasional  chest pain (negative cardiac work up July 2018), occasional post void dribbling and contact dermatitis right hand;   Denies headache, vision changes, nasal congestion, dysphagia, tinnitus, dizziness, hoarseness, cough, shortness of breath, nausea, vomiting, diarrhea,constipation,  urinary frequency, urgency  dysuria, hematuria, vaginitis symptoms, pelvic pain, swelling of joints,easy bruising,  myalgias, arthralgias, unexplained weight loss and except as is mentioned in the history of present illness, patient's review of systems is otherwise negative.    Physical Exam    Bp: 108/62   P: 60 bpm  R 16:  Temperature: 97.9 degrees F orally       Weight: 278 lbs.   Height: 5' 4.5"     BMI: 47  Neck: supple without masses or thyromegaly Lungs: clear to auscultation Heart: regular rate and rhythm Abdomen: soft and diffusely tender without guarding or rebound and no organomegaly Pelvic:EGBUS- wnl; vagina-normal rugae; uterus-(exam limited by habitus) appears normal size with  mild tenderness;  cervix without lesions or motion tenderness; adnexae-right sided  tenderness but no palpable  masses Extremities:  no clubbing, cyanosis or edema   Assesment: Right Hydrosalpinx                      Pelvic Pain   Disposition:  A discussion was held with patient regarding the indication for her procedure(s) along with the  risks, which include but are not limited to: reaction to anesthesia, damage to adjacent organs, infection, possible need for an open abdominal incision and  excessive bleeding. The patient verbalized understanding of these risks and has consented to proceed [facility] August 14, 2017.   CSN# [id number]   Elmira J. Lowell GuitarPowell, PA-C  for Dr. Crist FatSandra A. Armany Mano

## 2017-08-14 ENCOUNTER — Encounter (HOSPITAL_COMMUNITY): Payer: Self-pay

## 2017-08-14 ENCOUNTER — Ambulatory Visit (HOSPITAL_COMMUNITY): Payer: 59 | Admitting: Anesthesiology

## 2017-08-14 ENCOUNTER — Other Ambulatory Visit: Payer: Self-pay

## 2017-08-14 ENCOUNTER — Ambulatory Visit (HOSPITAL_COMMUNITY)
Admission: RE | Admit: 2017-08-14 | Discharge: 2017-08-14 | Disposition: A | Discharge status: Home / Self Care | Payer: 59 | Source: Ambulatory Visit | Attending: Obstetrics and Gynecology | Admitting: Obstetrics and Gynecology

## 2017-08-14 ENCOUNTER — Encounter (HOSPITAL_COMMUNITY)
Admission: RE | Disposition: A | Discharge status: Home / Self Care | Payer: Self-pay | Source: Ambulatory Visit | Attending: Obstetrics and Gynecology

## 2017-08-14 DIAGNOSIS — Z833 Family history of diabetes mellitus: Secondary | ICD-10-CM | POA: Diagnosis not present

## 2017-08-14 DIAGNOSIS — N838 Other noninflammatory disorders of ovary, fallopian tube and broad ligament: Secondary | ICD-10-CM | POA: Insufficient documentation

## 2017-08-14 DIAGNOSIS — N7011 Chronic salpingitis: Secondary | ICD-10-CM | POA: Diagnosis not present

## 2017-08-14 DIAGNOSIS — R011 Cardiac murmur, unspecified: Secondary | ICD-10-CM | POA: Insufficient documentation

## 2017-08-14 DIAGNOSIS — Z9884 Bariatric surgery status: Secondary | ICD-10-CM | POA: Insufficient documentation

## 2017-08-14 DIAGNOSIS — K219 Gastro-esophageal reflux disease without esophagitis: Secondary | ICD-10-CM | POA: Diagnosis not present

## 2017-08-14 DIAGNOSIS — G4730 Sleep apnea, unspecified: Secondary | ICD-10-CM | POA: Insufficient documentation

## 2017-08-14 DIAGNOSIS — Z87891 Personal history of nicotine dependence: Secondary | ICD-10-CM | POA: Insufficient documentation

## 2017-08-14 DIAGNOSIS — Z823 Family history of stroke: Secondary | ICD-10-CM | POA: Insufficient documentation

## 2017-08-14 DIAGNOSIS — D582 Other hemoglobinopathies: Secondary | ICD-10-CM | POA: Diagnosis not present

## 2017-08-14 DIAGNOSIS — D649 Anemia, unspecified: Secondary | ICD-10-CM | POA: Insufficient documentation

## 2017-08-14 DIAGNOSIS — E282 Polycystic ovarian syndrome: Secondary | ICD-10-CM | POA: Diagnosis not present

## 2017-08-14 DIAGNOSIS — J45909 Unspecified asthma, uncomplicated: Secondary | ICD-10-CM | POA: Insufficient documentation

## 2017-08-14 DIAGNOSIS — N83291 Other ovarian cyst, right side: Secondary | ICD-10-CM | POA: Diagnosis not present

## 2017-08-14 DIAGNOSIS — L680 Hirsutism: Secondary | ICD-10-CM | POA: Insufficient documentation

## 2017-08-14 DIAGNOSIS — Z9889 Other specified postprocedural states: Secondary | ICD-10-CM | POA: Insufficient documentation

## 2017-08-14 DIAGNOSIS — K760 Fatty (change of) liver, not elsewhere classified: Secondary | ICD-10-CM | POA: Diagnosis not present

## 2017-08-14 DIAGNOSIS — Z8249 Family history of ischemic heart disease and other diseases of the circulatory system: Secondary | ICD-10-CM | POA: Insufficient documentation

## 2017-08-14 DIAGNOSIS — Z800 Family history of malignant neoplasm of digestive organs: Secondary | ICD-10-CM | POA: Insufficient documentation

## 2017-08-14 DIAGNOSIS — Z8349 Family history of other endocrine, nutritional and metabolic diseases: Secondary | ICD-10-CM | POA: Diagnosis not present

## 2017-08-14 DIAGNOSIS — Z841 Family history of disorders of kidney and ureter: Secondary | ICD-10-CM | POA: Diagnosis not present

## 2017-08-14 DIAGNOSIS — Z8049 Family history of malignant neoplasm of other genital organs: Secondary | ICD-10-CM | POA: Insufficient documentation

## 2017-08-14 DIAGNOSIS — Z808 Family history of malignant neoplasm of other organs or systems: Secondary | ICD-10-CM | POA: Insufficient documentation

## 2017-08-14 DIAGNOSIS — Z825 Family history of asthma and other chronic lower respiratory diseases: Secondary | ICD-10-CM | POA: Diagnosis not present

## 2017-08-14 DIAGNOSIS — Z79899 Other long term (current) drug therapy: Secondary | ICD-10-CM | POA: Insufficient documentation

## 2017-08-14 DIAGNOSIS — N736 Female pelvic peritoneal adhesions (postinfective): Secondary | ICD-10-CM | POA: Insufficient documentation

## 2017-08-14 DIAGNOSIS — Z820 Family history of epilepsy and other diseases of the nervous system: Secondary | ICD-10-CM | POA: Insufficient documentation

## 2017-08-14 DIAGNOSIS — Z9049 Acquired absence of other specified parts of digestive tract: Secondary | ICD-10-CM | POA: Diagnosis not present

## 2017-08-14 DIAGNOSIS — Z8269 Family history of other diseases of the musculoskeletal system and connective tissue: Secondary | ICD-10-CM | POA: Insufficient documentation

## 2017-08-14 DIAGNOSIS — Z8042 Family history of malignant neoplasm of prostate: Secondary | ICD-10-CM | POA: Insufficient documentation

## 2017-08-14 DIAGNOSIS — R102 Pelvic and perineal pain: Secondary | ICD-10-CM | POA: Insufficient documentation

## 2017-08-14 HISTORY — PX: LAPAROSCOPIC BILATERAL SALPINGECTOMY: SHX5889

## 2017-08-14 LAB — PREGNANCY, URINE: Preg Test, Ur: NEGATIVE

## 2017-08-14 SURGERY — SALPINGECTOMY, BILATERAL, LAPAROSCOPIC
Anesthesia: General | Site: Abdomen | Laterality: Right

## 2017-08-14 MED ORDER — SCOPOLAMINE 1 MG/3DAYS TD PT72
MEDICATED_PATCH | TRANSDERMAL | Status: AC
  Filled 2017-08-14: qty 1

## 2017-08-14 MED ORDER — FENTANYL CITRATE (PF) 100 MCG/2ML IJ SOLN
INTRAMUSCULAR | Status: DC | PRN
  Administered 2017-08-14: 100 ug via INTRAVENOUS
  Administered 2017-08-14: 25 ug via INTRAVENOUS
  Administered 2017-08-14: 50 ug via INTRAVENOUS
  Administered 2017-08-14: 25 ug via INTRAVENOUS
  Administered 2017-08-14: 50 ug via INTRAVENOUS

## 2017-08-14 MED ORDER — DEXAMETHASONE SODIUM PHOSPHATE 10 MG/ML IJ SOLN
INTRAMUSCULAR | Status: DC | PRN
  Administered 2017-08-14: 4 mg via INTRAVENOUS

## 2017-08-14 MED ORDER — PHENYLEPHRINE HCL 10 MG/ML IJ SOLN
INTRAMUSCULAR | Status: DC | PRN
  Administered 2017-08-14 (×2): 40 ug via INTRAVENOUS

## 2017-08-14 MED ORDER — IBUPROFEN 600 MG PO TABS: ORAL_TABLET | ORAL | 2 refills | Status: DC

## 2017-08-14 MED ORDER — ONDANSETRON HCL 4 MG/2ML IJ SOLN
INTRAMUSCULAR | Status: DC | PRN
  Administered 2017-08-14: 4 mg via INTRAVENOUS

## 2017-08-14 MED ORDER — SUCCINYLCHOLINE CHLORIDE 20 MG/ML IJ SOLN
INTRAMUSCULAR | Status: DC | PRN
  Administered 2017-08-14: 140 mg via INTRAVENOUS

## 2017-08-14 MED ORDER — PROPOFOL 10 MG/ML IV BOLUS
INTRAVENOUS | Status: DC | PRN
  Administered 2017-08-14: 20 mg via INTRAVENOUS
  Administered 2017-08-14: 200 mg via INTRAVENOUS
  Administered 2017-08-14: 20 mg via INTRAVENOUS
  Administered 2017-08-14: 50 mg via INTRAVENOUS
  Administered 2017-08-14: 20 mg via INTRAVENOUS
  Administered 2017-08-14: 40 mg via INTRAVENOUS

## 2017-08-14 MED ORDER — SUGAMMADEX SODIUM 200 MG/2ML IV SOLN
INTRAVENOUS | Status: AC
  Filled 2017-08-14: qty 4

## 2017-08-14 MED ORDER — LIDOCAINE HCL (CARDIAC) 20 MG/ML IV SOLN
INTRAVENOUS | Status: DC | PRN
  Administered 2017-08-14: 30 mg via INTRAVENOUS

## 2017-08-14 MED ORDER — LACTATED RINGERS IV SOLN
INTRAVENOUS | Status: DC
  Administered 2017-08-14 (×2): via INTRAVENOUS

## 2017-08-14 MED ORDER — ONDANSETRON HCL 4 MG/2ML IJ SOLN
INTRAMUSCULAR | Status: AC
  Filled 2017-08-14: qty 2

## 2017-08-14 MED ORDER — SCOPOLAMINE 1 MG/3DAYS TD PT72
MEDICATED_PATCH | TRANSDERMAL | Status: AC
  Administered 2017-08-14: 1.5 mg via TRANSDERMAL
  Filled 2017-08-14: qty 1

## 2017-08-14 MED ORDER — ROCURONIUM BROMIDE 100 MG/10ML IV SOLN
INTRAVENOUS | Status: DC | PRN
  Administered 2017-08-14: 30 mg via INTRAVENOUS
  Administered 2017-08-14: 5 mg via INTRAVENOUS
  Administered 2017-08-14: 10 mg via INTRAVENOUS

## 2017-08-14 MED ORDER — SUGAMMADEX SODIUM 200 MG/2ML IV SOLN
INTRAVENOUS | Status: DC | PRN
  Administered 2017-08-14: 300 mg via INTRAVENOUS

## 2017-08-14 MED ORDER — ROCURONIUM BROMIDE 100 MG/10ML IV SOLN
INTRAVENOUS | Status: AC
  Filled 2017-08-14: qty 1

## 2017-08-14 MED ORDER — SOD CITRATE-CITRIC ACID 500-334 MG/5ML PO SOLN
ORAL | Status: AC
  Filled 2017-08-14: qty 15

## 2017-08-14 MED ORDER — LIDOCAINE HCL (CARDIAC) 20 MG/ML IV SOLN
INTRAVENOUS | Status: AC
  Filled 2017-08-14: qty 5

## 2017-08-14 MED ORDER — SUCCINYLCHOLINE CHLORIDE 200 MG/10ML IV SOSY
PREFILLED_SYRINGE | INTRAVENOUS | Status: AC
  Filled 2017-08-14: qty 10

## 2017-08-14 MED ORDER — GLYCOPYRROLATE 0.2 MG/ML IJ SOLN
INTRAMUSCULAR | Status: DC | PRN
  Administered 2017-08-14: 0.2 mg via INTRAVENOUS

## 2017-08-14 MED ORDER — FENTANYL CITRATE (PF) 100 MCG/2ML IJ SOLN
INTRAMUSCULAR | Status: AC
  Filled 2017-08-14: qty 2

## 2017-08-14 MED ORDER — SCOPOLAMINE 1 MG/3DAYS TD PT72
1.0000 | MEDICATED_PATCH | Freq: Once | TRANSDERMAL | Status: DC
  Administered 2017-08-14: 1.5 mg via TRANSDERMAL

## 2017-08-14 MED ORDER — SODIUM CHLORIDE 0.9 % IR SOLN
Status: DC | PRN
  Administered 2017-08-14: 3000 mL

## 2017-08-14 MED ORDER — BUPIVACAINE HCL (PF) 0.25 % IJ SOLN
INTRAMUSCULAR | Status: DC | PRN
  Administered 2017-08-14: 10 mL
  Administered 2017-08-14: 17 mL

## 2017-08-14 MED ORDER — OXYCODONE HCL 5 MG PO TABS: ORAL_TABLET | ORAL | 0 refills | Status: DC

## 2017-08-14 MED ORDER — DEXAMETHASONE SODIUM PHOSPHATE 4 MG/ML IJ SOLN
INTRAMUSCULAR | Status: AC
  Filled 2017-08-14: qty 1

## 2017-08-14 MED ORDER — SODIUM CHLORIDE 0.9 % IJ SOLN
INTRAMUSCULAR | Status: AC
  Filled 2017-08-14: qty 100

## 2017-08-14 MED ORDER — KETOROLAC TROMETHAMINE 30 MG/ML IJ SOLN
INTRAMUSCULAR | Status: AC
  Filled 2017-08-14: qty 1

## 2017-08-14 MED ORDER — KETOROLAC TROMETHAMINE 30 MG/ML IJ SOLN
INTRAMUSCULAR | Status: DC | PRN
  Administered 2017-08-14: 30 mg via INTRAVENOUS

## 2017-08-14 MED ORDER — PROPOFOL 10 MG/ML IV BOLUS
INTRAVENOUS | Status: AC
  Filled 2017-08-14: qty 40

## 2017-08-14 MED ORDER — MIDAZOLAM HCL 2 MG/2ML IJ SOLN
INTRAMUSCULAR | Status: DC | PRN
  Administered 2017-08-14: 2 mg via INTRAVENOUS

## 2017-08-14 MED ORDER — BUPIVACAINE HCL (PF) 0.25 % IJ SOLN
INTRAMUSCULAR | Status: AC
  Filled 2017-08-14: qty 30

## 2017-08-14 MED ORDER — FENTANYL CITRATE (PF) 250 MCG/5ML IJ SOLN
INTRAMUSCULAR | Status: AC
  Filled 2017-08-14: qty 5

## 2017-08-14 MED ORDER — MIDAZOLAM HCL 2 MG/2ML IJ SOLN
INTRAMUSCULAR | Status: AC
  Filled 2017-08-14: qty 2

## 2017-08-14 MED ORDER — VASOPRESSIN 20 UNIT/ML IV SOLN
INTRAVENOUS | Status: AC
  Filled 2017-08-14: qty 1

## 2017-08-14 MED ORDER — FENTANYL CITRATE (PF) 100 MCG/2ML IJ SOLN
25.0000 ug | INTRAMUSCULAR | Status: DC | PRN
  Administered 2017-08-14 (×4): 50 ug via INTRAVENOUS

## 2017-08-14 SURGICAL SUPPLY — 37 items
APPLICATOR COTTON TIP 6IN STRL (MISCELLANEOUS) ×2 IMPLANT
BARRIER ADHS 3X4 INTERCEED (GAUZE/BANDAGES/DRESSINGS) IMPLANT
CABLE HIGH FREQUENCY MONO STRZ (ELECTRODE) IMPLANT
DERMABOND ADVANCED (GAUZE/BANDAGES/DRESSINGS) ×1
DERMABOND ADVANCED .7 DNX12 (GAUZE/BANDAGES/DRESSINGS) ×1 IMPLANT
DISSECTOR BLUNT TIP ENDO 5MM (MISCELLANEOUS) IMPLANT
DURAPREP 26ML APPLICATOR (WOUND CARE) ×2 IMPLANT
ELECT REM PT RETURN 9FT ADLT (ELECTROSURGICAL) ×2
ELECTRODE REM PT RTRN 9FT ADLT (ELECTROSURGICAL) ×1 IMPLANT
FORCEPS CUTTING 33CM 5MM (CUTTING FORCEPS) ×2 IMPLANT
GLOVE BIOGEL PI IND STRL 7.0 (GLOVE) ×4 IMPLANT
GLOVE BIOGEL PI INDICATOR 7.0 (GLOVE) ×4
GLOVE ECLIPSE 6.5 STRL STRAW (GLOVE) ×2 IMPLANT
GOWN STRL REUS W/TWL LRG LVL3 (GOWN DISPOSABLE) ×4 IMPLANT
PACK LAPAROSCOPY BASIN (CUSTOM PROCEDURE TRAY) ×2 IMPLANT
PACK TRENDGUARD 450 HYBRID PRO (MISCELLANEOUS) IMPLANT
PACK TRENDGUARD 600 HYBRD PROC (MISCELLANEOUS) ×1 IMPLANT
PENCIL BUTTON HOLSTER BLD 10FT (ELECTRODE) ×2 IMPLANT
POUCH SPECIMEN RETRIEVAL 10MM (ENDOMECHANICALS) IMPLANT
PROTECTOR NERVE ULNAR (MISCELLANEOUS) ×4 IMPLANT
SCISSORS LAP 5X35 DISP (ENDOMECHANICALS) IMPLANT
SET IRRIG TUBING LAPAROSCOPIC (IRRIGATION / IRRIGATOR) ×2 IMPLANT
SLEEVE XCEL OPT CAN 5 100 (ENDOMECHANICALS) ×2 IMPLANT
SOLUTION ELECTROLUBE (MISCELLANEOUS) IMPLANT
SUT MNCRL AB 3-0 PS2 27 (SUTURE) ×2 IMPLANT
SUT VICRYL 0 UR6 27IN ABS (SUTURE) ×4 IMPLANT
SYR 50ML LL SCALE MARK (SYRINGE) IMPLANT
SYSTEM CONVERTIBLE TROCAR (TROCAR) ×2 IMPLANT
TOWEL OR 17X24 6PK STRL BLUE (TOWEL DISPOSABLE) ×4 IMPLANT
TRAY FOLEY CATH SILVER 14FR (SET/KITS/TRAYS/PACK) ×2 IMPLANT
TRENDGUARD 450 HYBRID PRO PACK (MISCELLANEOUS)
TRENDGUARD 600 HYBRID PROC PK (MISCELLANEOUS) ×2
TROCAR 12M 150ML BLUNT (TROCAR) ×4 IMPLANT
TROCAR 5M 150ML BLDLS (TROCAR) ×2 IMPLANT
TROCAR BALLN 12MMX100 BLUNT (TROCAR) ×2 IMPLANT
TROCAR XCEL NON-BLD 5MMX100MML (ENDOMECHANICALS) ×2 IMPLANT
WARMER LAPAROSCOPE (MISCELLANEOUS) ×2 IMPLANT

## 2017-08-14 NOTE — Anesthesia Postprocedure Evaluation (Signed)
Anesthesia Post Note  Patient: Karina [name]  Procedure(s) Performed: LAPAROSCOPIC RIGHT  SALPINGECTOMY (Right Abdomen)     Patient location during evaluation: PACU Anesthesia Type: General Level of consciousness: awake Pain management: pain level controlled Vital Signs Assessment: post-procedure vital signs reviewed and stable Respiratory status: spontaneous breathing Cardiovascular status: stable Postop Assessment: no apparent nausea or vomiting Anesthetic complications: no    Last Vitals:  Vitals:   08/14/17 1545 08/14/17 1630  BP:    Pulse:    Resp:    Temp:  36.8 C  SpO2: 99%     Last Pain:  Vitals:   08/14/17 1630  TempSrc:   PainSc: 4    Pain Goal: Patients Stated Pain Goal: 4 (08/14/17 1500)               Cece Milhouse JR,JOHN Timouthy Gilardi

## 2017-08-14 NOTE — Progress Notes (Signed)
I reviewed D/C instructions in Phase 2 with husband and patient.  I informed husband and patient that I would take out her IV and obtain VS and wheel her out to the car.  Husband left to bring car to main entrance.  I had a conversation with patient that Tammy, PACU RN must have already removed her IV because I saw two places with 2x2s on her hands and one on her wrist.  Patient stated "yes" it was removed.  Patient had long sleeves on and I did not see any reason to check further.  Obtained VS and wheel patient to the car.  Later when Tammy, RN inquired about time that the IV was D/C, we realized that patient was discharged home with IV saline locked.  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 I called and spoke with patient about the saline lock IV.  Patient apologized and thought it was d/c.  Patient stated she could d/c her IV herself.  Via phone, I instructed patient how to d/c IV, apply some pressure if bleeding and cover with band aid.  Patient verbalized understanding.

## 2017-08-14 NOTE — Discharge Instructions (Addendum)
Call [facility] @ [phone] if:  You have a temperature greater than or equal to 100.4 degrees Farenheit orally You have pain that is not made better by the pain medication given and taken as directed You have excessive bleeding or problems urinating  Take Colace (Docusate Sodium/Stool Softener) 100 mg 2-3 times daily while taking narcotic pain medicine to avoid constipation or until bowel movements are regular. Take Ibuprofen 600 mg with food every 6 hours for 3 days then as needed for pain  You may drive after 24 hours  You may shower tomorrow You may resume a regular diet Keep incisions clean and dry;remove your honeycomb dressing on your navel in 7 days   Avoid anything in vagina  until after your post-operative visit   Post Anesthesia Home Care Instructions  NO IBUPROFEN PRODUCTS UNTIL: 7:45 PM TONIGHT  Activity: Get plenty of rest for the remainder of the day. A responsible individual must stay with you for 24 hours following the procedure.  For the next 24 hours, DO NOT: -Drive a car -Advertising copywriterperate machinery -Drink alcoholic beverages -Take any medication unless instructed by your physician -Make any legal decisions or sign important papers.  Meals: Start with liquid foods such as gelatin or soup. Progress to regular foods as tolerated. Avoid greasy, spicy, heavy foods. If nausea and/or vomiting occur, drink only clear liquids until the nausea and/or vomiting subsides. Call your physician if vomiting continues.  Special Instructions/Symptoms: Your throat may feel dry or sore from the anesthesia or the breathing tube placed in your throat during surgery. If this causes discomfort, gargle with warm salt water. The discomfort should disappear within 24 hours.  If you had a scopolamine patch placed behind your ear for the management of post- operative nausea and/or vomiting:  1. The medication in the patch is effective for 72 hours, after which it should be  removed.  Wrap patch in a tissue and discard in the trash. Wash hands thoroughly with soap and water. 2. You may remove the patch earlier than 72 hours if you experience unpleasant side effects which may include dry mouth, dizziness or visual disturbances. 3. Avoid touching the patch. Wash your hands with soap and water after contact with the patch.

## 2017-08-14 NOTE — Anesthesia Procedure Notes (Signed)
Procedure Name: Intubation Date/Time: 08/14/2017 12:52 PM Performed by: Garner Nash, CRNA Pre-anesthesia Checklist: Patient identified, Suction available, Patient being monitored, Timeout performed and Emergency Drugs available Patient Re-evaluated:Patient Re-evaluated prior to induction Oxygen Delivery Method: Circle system utilized Preoxygenation: Pre-oxygenation with 100% oxygen Induction Type: IV induction and Cricoid Pressure applied Ventilation: Mask ventilation without difficulty Laryngoscope Size: Mac and 3 Grade View: Grade I Tube type: Oral Tube size: 7.0 mm Number of attempts: 1 Airway Equipment and Method: Stylet Placement Confirmation: ETT inserted through vocal cords under direct vision,  positive ETCO2 and breath sounds checked- equal and bilateral Secured at: 21 cm Tube secured with: Tape Dental Injury: Teeth and Oropharynx as per pre-operative assessment

## 2017-08-14 NOTE — Op Note (Signed)
Preoperative diagnosis: Pelvic pain with right tubal mass  Postoperative diagnosis: Same  Anesthesia: Gen.  Anesthesiologist: Dr. Arby Barrettehatchett  Procedure: Right salpyngectomy via laparoscopy with lysis of adhesions  Surgeon: Dr. Dois DavenportSandra Zarin Knupp  Assistant: Henreitta LeberElmira Powell PA-C  Estimated blood loss: Minimal  Procedure:  After being informed of the planned procedure with possible complications including bleeding, infection and injury to other organs, informed consent is obtained and the patient is taken to OR # 8. She is placed in lithotomy position, prepped and draped in a sterile fashion, and her bladder is emptied with an in and out red rubber catheter.  Pelvic exam: Normal external genitalia, normal cervix, normal uterus, normal adnexa  A speculum is inserted in the vagina and the anterior lip of the cervix was grasped with tenaculum forcep. An acorn intrauterine manipulator is easily positioned and the speculum is removed.  We infiltrate the umbilical area using 10 cc of Marcaine 0.25 and perform a semi-elliptical incision which is brought down bluntly to the fascia. The fascia is identified and grasped with Coker forceps. The fascia is opened with Mayo scissors. Peritoneum is entered bluntly. A pursestring suture of 0 Vicryl is placed on the fascia. A 10 mm Hassan trocar is easily inserted in the abdominal cavity and is held in place both by the intrauterine balloon and the previously placed pursestring suture. This allows for easy insufflation of the pneumoperitoneum using warm CO2 at a maximum pressure of 15 mmHg. A operative laparoscope is inserted in the abdominal cavity.  Observation: Anterior cul-de-sac is normal, posterior cul-de-sac is normal, uterus is normal. Both ovaries are normal. Left tube is normal.  Tube has a 4 cm cornual mass which could represent an old tubal pregnancy or an endometrioma. Appendix is not visualized.   Liver is visualized and normal.   We place 2   5 mm  trocars under direct visualization in the LLQ and RLQ after infiltrating each site with 10 cc of Marcaine 0.25%.   Using Gyrus bipolar energy we proceed with cauterization of the right tube at the cornual area and section it. This allows for systematic cauterization and section of the mesosalpynx until the tube is freed completely. The right tube and its mass are removed from the abdomen.  We irrigate the pelvis with warm saline and confirm hemostasis.  All instruments are then removed after evacuating the pneumoperitoneum. The fascia of the umbilical incision is closed with the purse string suture of 0 Vicryl. The skin of all 3 incisions is closed with a subcuticular suture of 3-0 Monocryl and Dermabond.A figure-of-eight 0-Vicryl stitch is required to achieve hemostasis on the anterior lip of the cervix at the Tenaculum placement.  Instrument and sponge count is complete x2. Estimated blood loss is minimal.The procedure is well tolerated by the patient is taken to recovery room in a well and stable condition.  Specimen: Right tube and right tubal mass are sent to pathology

## 2017-08-14 NOTE — Transfer of Care (Signed)
Immediate Anesthesia Transfer of Care Note  Patient: Karina [name]  Procedure(s) Performed: LAPAROSCOPIC RIGHT  SALPINGECTOMY (Right Abdomen)  Patient Location: PACU  Anesthesia Type:General  Level of Consciousness: awake and alert   Airway & Oxygen Therapy: Patient Spontanous Breathing and Patient connected to nasal cannula oxygen  Post-op Assessment: Report given to RN and Post -op Vital signs reviewed and stable  Post vital signs: Reviewed  Last Vitals:  Vitals:   08/14/17 1119  BP: 122/70  Pulse: 75  Resp: 16  Temp: 36.6 C  SpO2: 99%    Last Pain:  Vitals:   08/14/17 1119  TempSrc: Oral  PainSc: 3       Patients Stated Pain Goal: 4 (08/14/17 1119)  Complications: No apparent anesthesia complications

## 2017-08-14 NOTE — Interval H&P Note (Signed)
History and Physical Interval Note:  08/14/2017 12:26 PM  Karina [name]  has presented today for surgery, with the diagnosis of RIGHT Hydrosalpinx PARATUIBAL CYST  The various methods of treatment have been discussed with the patient and family. After consideration of risks, benefits and other options for treatment, the patient has consented to  Procedure(s): LAPAROSCOPIC RIGHT  SALPINGECTOMY (Right) as a surgical intervention .  The patient's history has been reviewed, patient examined, no change in status, stable for surgery.  I have reviewed the patient's chart and labs.  Questions were answered to the patient's satisfaction.     [name]

## 2017-08-14 NOTE — Anesthesia Preprocedure Evaluation (Addendum)
Anesthesia Evaluation  Patient identified by MRN, date of birth, ID band Patient awake    Reviewed: Allergy & Precautions, NPO status , Patient's Chart, lab work & pertinent test results  Airway Mallampati: II  TM Distance: >3 FB     Dental   Pulmonary asthma , sleep apnea , former smoker,    breath sounds clear to auscultation       Cardiovascular + Valvular Problems/Murmurs  Rhythm:Regular Rate:Normal     Neuro/Psych    GI/Hepatic Neg liver ROS, GERD  ,  Endo/Other  negative endocrine ROS  Renal/GU negative Renal ROS     Musculoskeletal   Abdominal   Peds  Hematology   Anesthesia Other Findings   Reproductive/Obstetrics                            Anesthesia Physical Anesthesia Plan  ASA: III  Anesthesia Plan: General   Post-op Pain Management:    Induction: Intravenous  PONV Risk Score and Plan: 2 and Treatment may vary due to age or medical condition, Ondansetron, Dexamethasone and Midazolam  Airway Management Planned: Oral ETT  Additional Equipment:   Intra-op Plan:   Post-operative Plan: Extubation in OR  Informed Consent: I have reviewed the patients History and Physical, chart, labs and discussed the procedure including the risks, benefits and alternatives for the proposed anesthesia with the patient or authorized representative who has indicated his/her understanding and acceptance.   Dental advisory given  Plan Discussed with: CRNA and Anesthesiologist  Anesthesia Plan Comments:         Anesthesia Quick Evaluation

## 2017-08-15 ENCOUNTER — Encounter (HOSPITAL_COMMUNITY): Payer: Self-pay | Admitting: Obstetrics and Gynecology

## 2017-08-27 IMAGING — US US OB TRANSVAGINAL
1 series · 15 of 28 positions shown · non-contrast
Comparison: 10/26/2016, HSG 07/04/2016, and CT on 05/10/2016

CLINICAL DATA: Vaginal bleeding. Threatened miscarriage. First
trimester pregnancy with inconclusive fetal viability.

EXAM:
TRANSVAGINAL OB ULTRASOUND
TECHNIQUE: Transvaginal ultrasound was performed for complete evaluation of the
gestation as well as the maternal uterus, adnexal regions, and
pelvic cul-de-sac.

[Series 1: us ob transvaginal · 51 acquisitions, 15 frames shown]
[im 1/51]
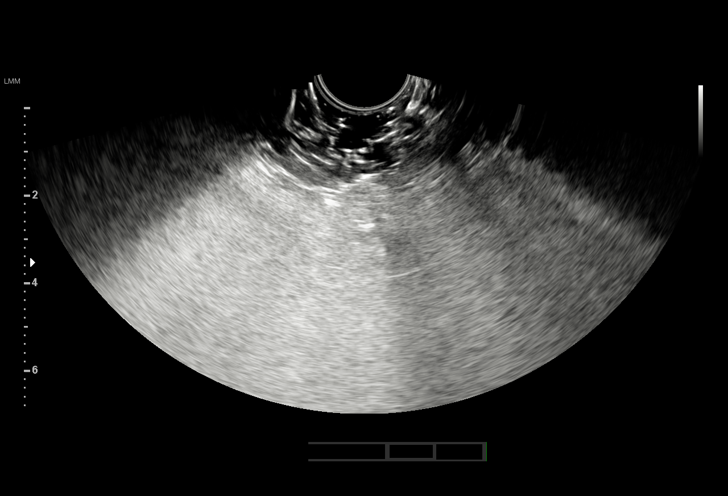
[im 4/51]
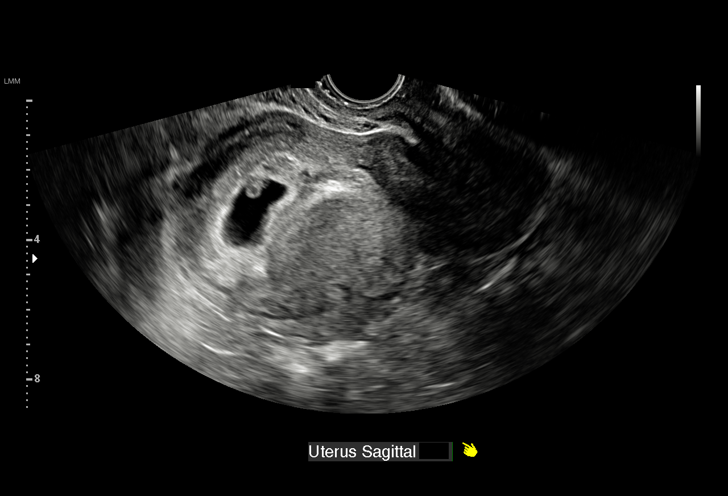
[im 8/51]
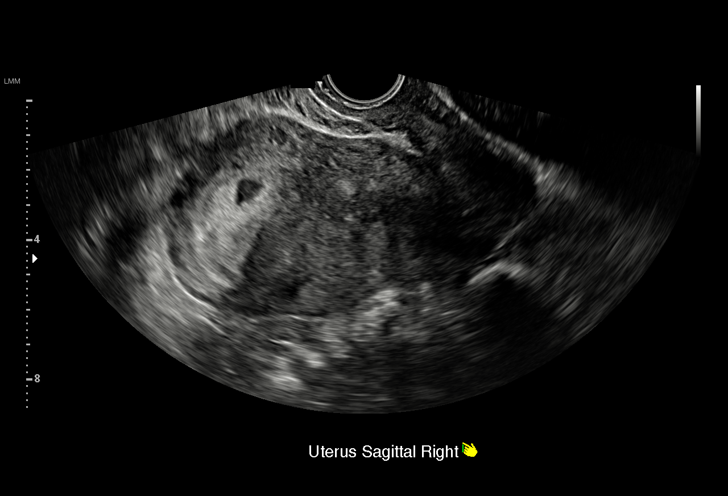
[im 12/51]
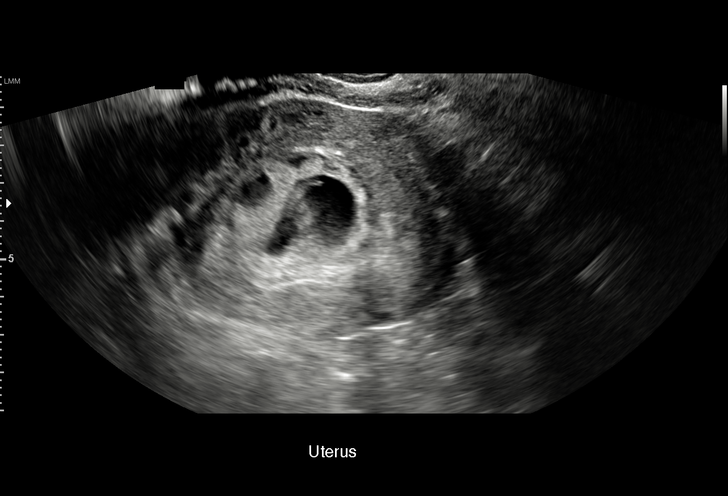
[im 15/51]
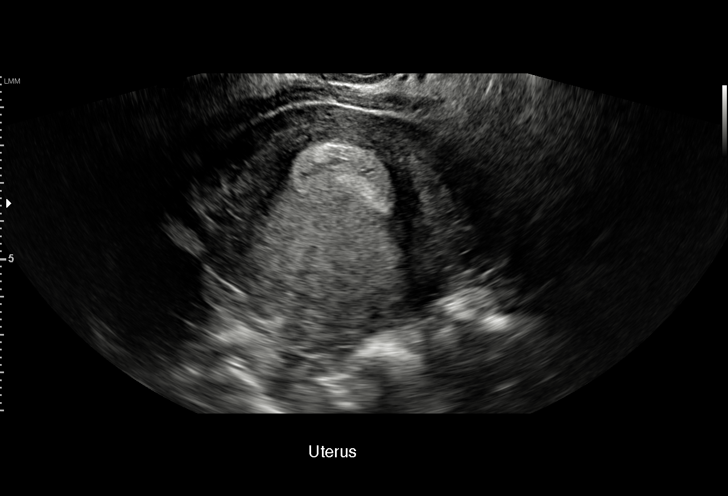
[im 19/51]
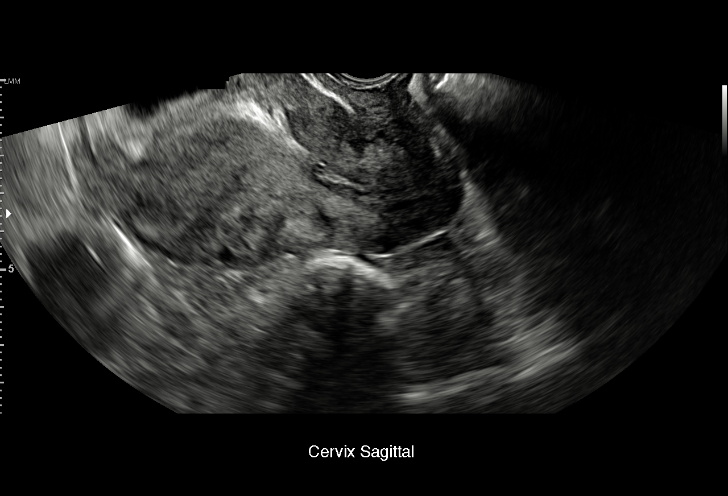
[im 23/51]
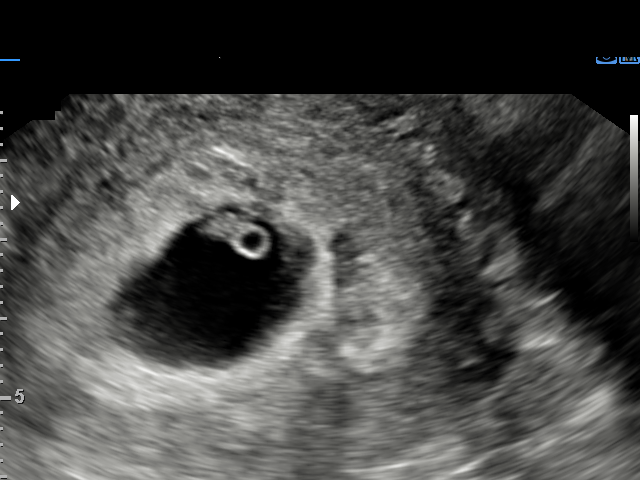
[im 26/51]
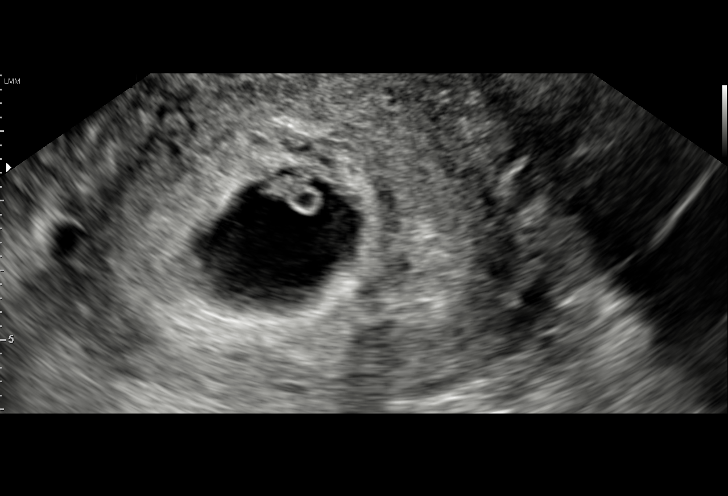
[im 28/51]
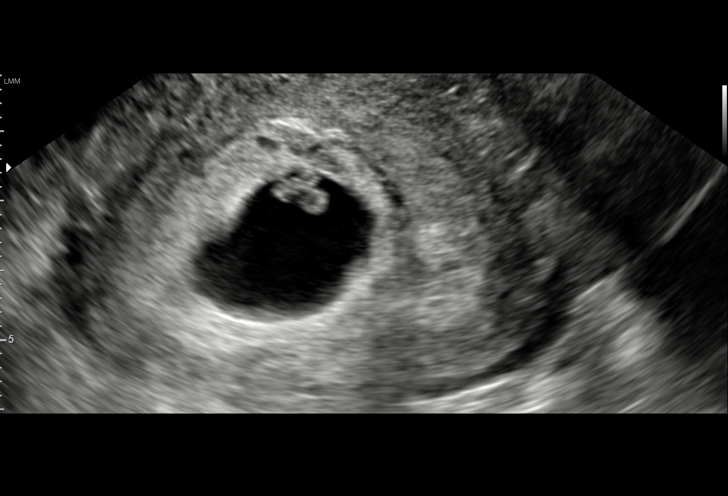
[im 32/51]
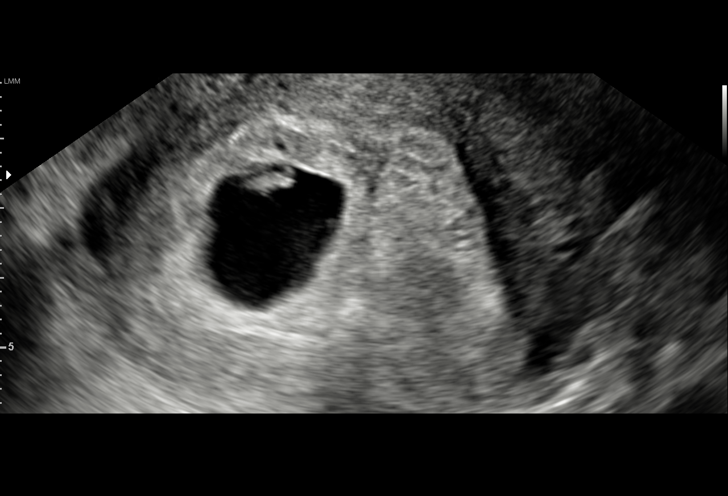
[im 36/51]
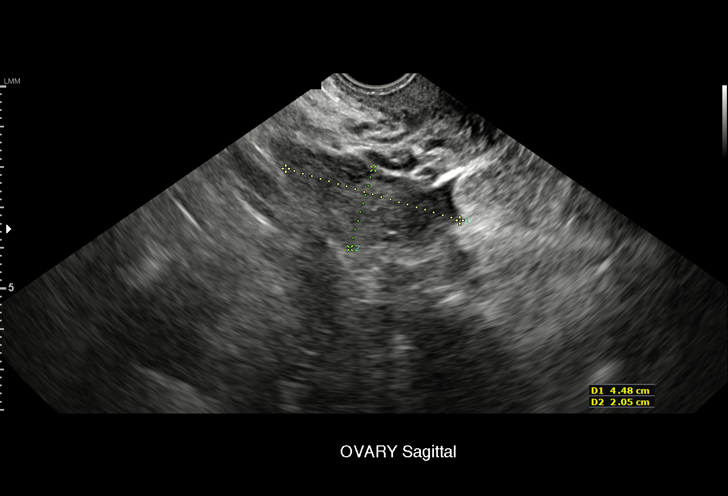
[im 39/51]
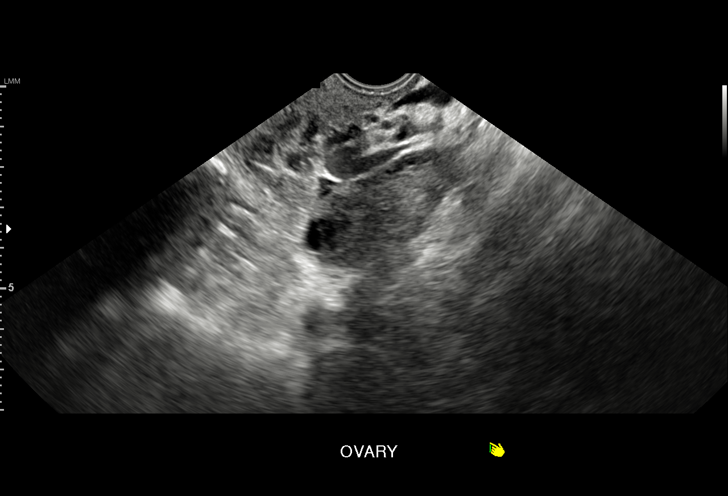
[im 43/51]
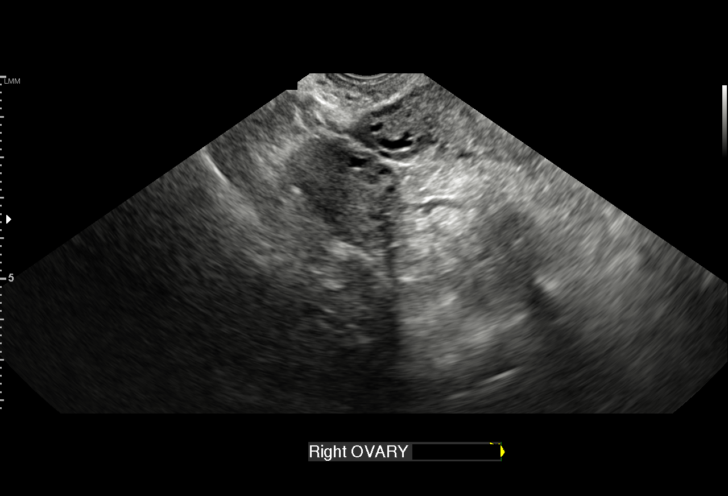
[im 47/51]
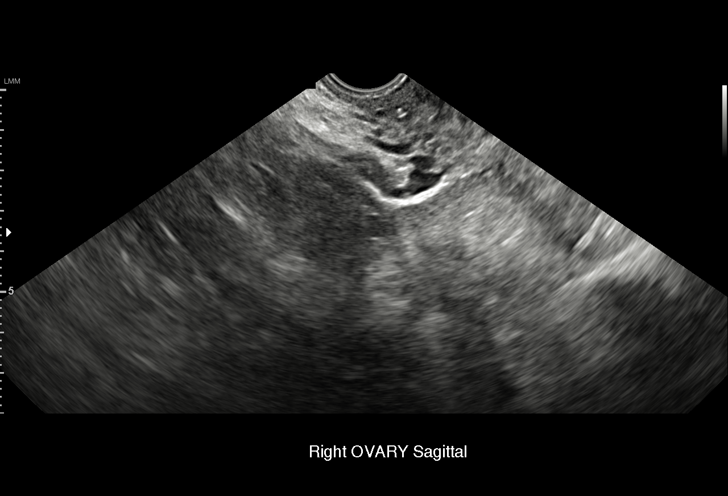
[im 51/51]
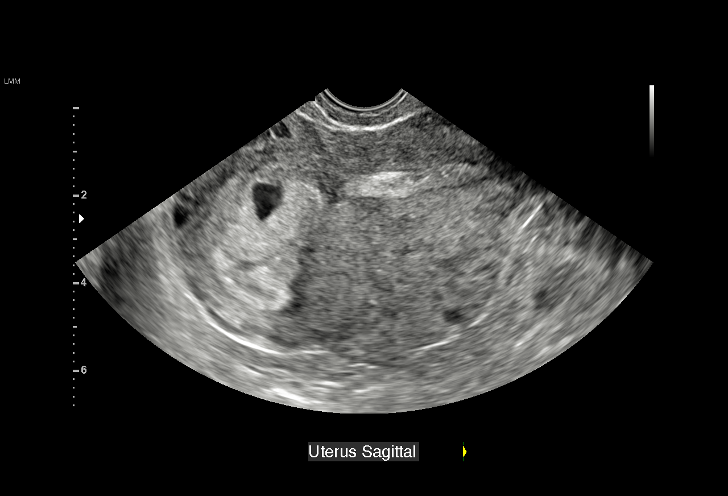

[15 of 28 positions shown; findings below may reference images not displayed]

FINDINGS: Intrauterine gestational sac: Single

Yolk sac:  Visualized.

Embryo:  Visualized.

Cardiac Activity: Visualized.

Heart Rate: 121 bpm

CRL:   7  mm   6 w 4 d                  US EDC: 06/26/2017

Subchorionic hemorrhage:  Small subchorionic hemorrhage.

Maternal uterus/adnexae: Normal appearance of both ovaries. A 4 cm
cystic lesion is seen in the right adnexa which appears separate
from the right ovary. This is stable and consistent with
hydrosalpinx as demonstrated on previous HSG.
IMPRESSION: Single living IUP measuring 6 weeks 4 days with US EDC of
06/16/2017.

Small subchorionic hemorrhage.

Stable appearance of right hydrosalpinx as demonstrated on previous
HSG.

## 2017-08-28 DIAGNOSIS — Z09 Encounter for follow-up examination after completed treatment for conditions other than malignant neoplasm: Secondary | ICD-10-CM | POA: Diagnosis not present

## 2017-08-28 DIAGNOSIS — F329 Major depressive disorder, single episode, unspecified: Secondary | ICD-10-CM | POA: Diagnosis not present

## 2017-08-28 DIAGNOSIS — N898 Other specified noninflammatory disorders of vagina: Secondary | ICD-10-CM | POA: Diagnosis not present

## 2017-10-17 ENCOUNTER — Telehealth: Payer: 59 | Admitting: Physician Assistant

## 2017-10-17 DIAGNOSIS — J069 Acute upper respiratory infection, unspecified: Secondary | ICD-10-CM | POA: Diagnosis not present

## 2017-10-17 DIAGNOSIS — B9789 Other viral agents as the cause of diseases classified elsewhere: Secondary | ICD-10-CM | POA: Diagnosis not present

## 2017-10-17 MED ORDER — BENZONATATE 100 MG PO CAPS: 100.0000 mg | ORAL_CAPSULE | Freq: Three times a day (TID) | ORAL | 0 refills | Status: DC | PRN

## 2017-10-17 NOTE — Progress Notes (Signed)
We are sorry that you are not feeling well.  Here is how we plan to help!  Based on your presentation I believe you most likely have A cough due to a virus.  This is called viral bronchitis and is best treated by rest, plenty of fluids and control of the cough.  You may use Ibuprofen or Tylenol as directed to help your symptoms.     In addition you may use A prescription cough medication called Tessalon Perles 100mg . You may take 1-2 capsules every 8 hours as needed for your cough.  IF YOU ARE NOTING ANY TRUE CHEST PAIN AND NOT JUST TENDERNESS IN THE CHEST WALL FROM COUGHING, YOU NEED TO BE SEEN IN THE ER OR CLOSEST URGENT CARE.  From your responses in the eVisit questionnaire you describe inflammation in the upper respiratory tract which is causing a significant cough.  This is commonly called Bronchitis and has four common causes:    Allergies  Viral Infections  Acid Reflux  Bacterial Infection Allergies, viruses and acid reflux are treated by controlling symptoms or eliminating the cause. An example might be a cough caused by taking certain blood pressure medications. You stop the cough by changing the medication. Another example might be a cough caused by acid reflux. Controlling the reflux helps control the cough.  USE OF BRONCHODILATOR ("RESCUE") INHALERS: There is a risk from using your bronchodilator too frequently.  The risk is that over-reliance on a medication which only relaxes the muscles surrounding the breathing tubes can reduce the effectiveness of medications prescribed to reduce swelling and congestion of the tubes themselves.  Although you feel brief relief from the bronchodilator inhaler, your asthma may actually be worsening with the tubes becoming more swollen and filled with mucus.  This can delay other crucial treatments, such as oral steroid medications. If you need to use a bronchodilator inhaler daily, several times per day, you should discuss this with your provider.   There are probably better treatments that could be used to keep your asthma under control.     HOME CARE . Only take medications as instructed by your medical team. . Complete the entire course of an antibiotic. . Drink plenty of fluids and get plenty of rest. . Avoid close contacts especially the very young and the elderly . Cover your mouth if you cough or cough into your sleeve. . Always remember to wash your hands . A steam or ultrasonic humidifier can help congestion.   GET HELP RIGHT AWAY IF: . You develop worsening fever. . You become short of breath . You cough up blood. . Your symptoms persist after you have completed your treatment plan MAKE SURE YOU   Understand these instructions.  Will watch your condition.  Will get help right away if you are not doing well or get worse.  Your e-visit answers were reviewed by a board certified advanced clinical practitioner to complete your personal care plan.  Depending on the condition, your plan could have included both over the counter or prescription medications. If there is a problem please reply  once you have received a response from your provider. Your safety is important to us.  If you have drug allergies check your prescription carefully.    You can use MyChart to ask questions about today's visit, request a non-urgent call back, or ask for a work or school excuse for 24 hours related to this e-Visit. If it has been greater than 24 hours you will need to  follow up with your provider, or enter a new e-Visit to address those concerns. You will get an e-mail in the next two days asking about your experience.  I hope that your e-visit has been valuable and will speed your recovery. Thank you for using e-visits.

## 2018-01-23 ENCOUNTER — Ambulatory Visit (INDEPENDENT_AMBULATORY_CARE_PROVIDER_SITE_OTHER): Payer: Self-pay | Admitting: Family Medicine

## 2018-01-23 VITALS — BP 124/86 | HR 82 | Temp 98.5°F | Resp 18 | Wt 296.6 lb

## 2018-01-23 DIAGNOSIS — H9201 Otalgia, right ear: Secondary | ICD-10-CM

## 2018-01-23 NOTE — Patient Instructions (Signed)

## 2018-01-23 NOTE — Progress Notes (Signed)
Karina [name] [name] is a 37 y.o. female who presents today with concerns of ear pain x 5 days. She reports retrieving some honey colored material from her ears. She denies using any medication OTC for symptoms and denies recent head injury or respiratory infection or illness.  Review of Systems  Constitutional: Negative for chills, fever and malaise/fatigue.  HENT: Positive for ear pain. Negative for congestion, ear discharge, sinus pain and sore throat.   Eyes: Negative.   Respiratory: Negative for cough, sputum production and shortness of breath.   Cardiovascular: Negative.  Negative for chest pain.  Gastrointestinal: Negative for abdominal pain, diarrhea, nausea and vomiting.  Genitourinary: Negative for dysuria, frequency, hematuria and urgency.  Musculoskeletal: Negative for myalgias.  Skin: Negative.   Neurological: Negative for headaches.  Endo/Heme/Allergies: Negative.   Psychiatric/Behavioral: Negative.     O: Vitals:   01/23/18 1939  BP: 124/86  Pulse: 82  Resp: 18  Temp: 98.5 F (36.9 C)  SpO2: 96%     Physical Exam  Constitutional: She is oriented to person, place, and time. Vital signs are normal. She appears well-developed and well-nourished. She is active.  Non-toxic appearance. She does not have a sickly appearance.  HENT:  Head: Normocephalic.  Right Ear: Hearing, tympanic membrane, external ear and ear canal normal.  Left Ear: Hearing, tympanic membrane, external ear and ear canal normal.  Nose: Nose normal.  Mouth/Throat: Uvula is midline and oropharynx is clear and moist.  Neck: Normal range of motion. Neck supple.  Cardiovascular: Normal rate, regular rhythm, normal heart sounds and normal pulses.  Pulmonary/Chest: Effort normal and breath sounds normal.  Abdominal: Soft. Bowel sounds are normal.  Musculoskeletal: Normal range of motion.  Lymphadenopathy:       Head (right side): No submental and no submandibular adenopathy present.       Head (left  side): No submental and no submandibular adenopathy present.    She has no cervical adenopathy.  Neurological: She is alert and oriented to person, place, and time.  Psychiatric: She has a normal mood and affect.  Vitals reviewed.    A: 1. Right ear pain      P: Exam WNL advised patient to take OTC motrin for mild pain and follow up if symptoms worsen or continue. Ear exam bilaterally is WNL (perfect).  Exam findings, diagnosis etiology and medication use and indications reviewed with patient. Follow- Up and discharge instructions provided. No emergent/urgent issues found on exam.  Patient verbalized understanding of information provided and agrees with plan of care (POC), all questions answered.  1. Right ear pain

## 2018-01-30 DIAGNOSIS — H6981 Other specified disorders of Eustachian tube, right ear: Secondary | ICD-10-CM | POA: Diagnosis not present

## 2018-02-26 ENCOUNTER — Telehealth: Payer: 59 | Admitting: Family

## 2018-02-26 DIAGNOSIS — L309 Dermatitis, unspecified: Secondary | ICD-10-CM

## 2018-02-26 MED ORDER — TRIAMCINOLONE ACETONIDE 0.1 % EX CREA
1.0000 | TOPICAL_CREAM | Freq: Two times a day (BID) | CUTANEOUS | 0 refills | Status: DC
Start: 2018-02-26 — End: 2020-03-22

## 2018-02-26 NOTE — Progress Notes (Signed)
E Visit for Rash  We are sorry that you are not feeling well. Here is how we plan to help!  Based on what you shared with me it looks like you have contact dermatitis.  Contact dermatitis is a skin rash caused by something that touches the skin and causes irritation or inflammation.  Your skin may be red, swollen, dry, cracked, and itch.  The rash should go away in a few days but can last a few weeks.  If you get a rash, it's important to figure out what caused it so the irritant can be avoided in the future.  I have sent in a prescription of Kenalog cream to apply twice a day.  HOME CARE:   Take cool showers and avoid direct sunlight.  Apply cool compress or wet dressings.  Take a bath in an oatmeal bath.  Sprinkle content of one Aveeno packet under running faucet with comfortably warm water.  Bathe for 15-20 minutes, 1-2 times daily.  Pat dry with a towel. Do not rub the rash.  Use hydrocortisone cream.  Take an antihistamine like Benadryl for widespread rashes that itch.  The adult dose of Benadryl is 25-50 mg by mouth 4 times daily.  Caution:  This type of medication may cause sleepiness.  Do not drink alcohol, drive, or operate dangerous machinery while taking antihistamines.  Do not take these medications if you have prostate enlargement.  Read package instructions thoroughly on all medications that you take.  GET HELP RIGHT AWAY IF:   Symptoms don't go away after treatment.  Severe itching that persists.  If you rash spreads or swells.  If you rash begins to smell.  If it blisters and opens or develops a yellow-brown crust.  You develop a fever.  You have a sore throat.  You become short of breath.  MAKE SURE YOU:  Understand these instructions. Will watch your condition. Will get help right away if you are not doing well or get worse.  Thank you for choosing an e-visit. Your e-visit answers were reviewed by a board certified advanced clinical practitioner to  complete your personal care plan. Depending upon the condition, your plan could have included both over the counter or prescription medications. Please review your pharmacy choice. Be sure that the pharmacy you have chosen is open so that you can pick up your prescription now.  If there is a problem you may message your provider in MyChart to have the prescription routed to another pharmacy. Your safety is important to us. If you have drug allergies check your prescription carefully.  For the next 24 hours, you can use MyChart to ask questions about today's visit, request a non-urgent call back, or ask for a work or school excuse from your e-visit provider. You will get an email in the next two days asking about your experience. I hope that your e-visit has been valuable and will speed your recovery.

## 2018-02-28 DIAGNOSIS — R945 Abnormal results of liver function studies: Secondary | ICD-10-CM | POA: Diagnosis not present

## 2018-02-28 DIAGNOSIS — Z01419 Encounter for gynecological examination (general) (routine) without abnormal findings: Secondary | ICD-10-CM | POA: Diagnosis not present

## 2018-02-28 DIAGNOSIS — E663 Overweight: Secondary | ICD-10-CM | POA: Diagnosis not present

## 2018-02-28 DIAGNOSIS — E282 Polycystic ovarian syndrome: Secondary | ICD-10-CM | POA: Diagnosis not present

## 2018-02-28 DIAGNOSIS — Z113 Encounter for screening for infections with a predominantly sexual mode of transmission: Secondary | ICD-10-CM | POA: Diagnosis not present

## 2018-02-28 DIAGNOSIS — N898 Other specified noninflammatory disorders of vagina: Secondary | ICD-10-CM | POA: Diagnosis not present

## 2018-02-28 DIAGNOSIS — E669 Obesity, unspecified: Secondary | ICD-10-CM | POA: Diagnosis not present

## 2018-02-28 DIAGNOSIS — Z124 Encounter for screening for malignant neoplasm of cervix: Secondary | ICD-10-CM | POA: Diagnosis not present

## 2018-02-28 DIAGNOSIS — R748 Abnormal levels of other serum enzymes: Secondary | ICD-10-CM | POA: Diagnosis not present

## 2018-02-28 DIAGNOSIS — R5383 Other fatigue: Secondary | ICD-10-CM | POA: Diagnosis not present

## 2018-03-30 DIAGNOSIS — H5213 Myopia, bilateral: Secondary | ICD-10-CM | POA: Diagnosis not present

## 2018-05-19 ENCOUNTER — Ambulatory Visit (INDEPENDENT_AMBULATORY_CARE_PROVIDER_SITE_OTHER): Payer: Self-pay | Admitting: Family Medicine

## 2018-05-19 VITALS — BP 124/84 | HR 63 | Temp 98.0°F | Resp 18 | Wt 305.4 lb

## 2018-05-19 DIAGNOSIS — G8929 Other chronic pain: Secondary | ICD-10-CM

## 2018-05-19 DIAGNOSIS — J029 Acute pharyngitis, unspecified: Secondary | ICD-10-CM

## 2018-05-19 DIAGNOSIS — H9201 Otalgia, right ear: Secondary | ICD-10-CM

## 2018-05-19 LAB — POCT RAPID STREP A (OFFICE): RAPID STREP A SCREEN: NEGATIVE

## 2018-05-19 MED ORDER — FLUTICASONE PROPIONATE 50 MCG/ACT NA SUSP: 2.0000 | Freq: Every day | NASAL | 0 refills | Status: DC

## 2018-05-19 MED ORDER — AZELASTINE HCL 0.1 % NA SOLN: 1.0000 | Freq: Two times a day (BID) | NASAL | 0 refills | Status: DC

## 2018-05-19 NOTE — Patient Instructions (Addendum)
PLAN< Warm salt water rinses Over the counter throat lozenges Tylenol/Motin for throat pain Follow up with PCP for potential ENT referral for chronic ear pain    Sore Throat A sore throat is pain, burning, irritation, or scratchiness in the throat. When you have a sore throat, you may feel pain or tenderness in your throat when you swallow or talk. Many things can cause a sore throat, including:  An infection.  Seasonal allergies.  Dryness in the air.  Irritants, such as smoke or pollution.  Gastroesophageal reflux disease (GERD).  A tumor.  A sore throat is often the first sign of another sickness. It may happen with other symptoms, such as coughing, sneezing, fever, and swollen neck glands. Most sore throats go away without medical treatment. Follow these instructions at home:  Take over-the-counter medicines only as told by your health care provider.  Drink enough fluids to keep your urine clear or pale yellow.  Rest as needed.  To help with pain, try: ? Sipping warm liquids, such as broth, herbal tea, or warm water. ? Eating or drinking cold or frozen liquids, such as frozen ice pops. ? Gargling with a salt-water mixture 3-4 times a day or as needed. To make a salt-water mixture, completely dissolve -1 tsp of salt in 1 cup of warm water. ? Sucking on hard candy or throat lozenges. ? Putting a cool-mist humidifier in your bedroom at night to moisten the air. ? Sitting in the bathroom with the door closed for 5-10 minutes while you run hot water in the shower.  Do not use any tobacco products, such as cigarettes, chewing tobacco, and e-cigarettes. If you need help quitting, ask your health care provider. Contact a health care provider if:  You have a fever for more than 2-3 days.  You have symptoms that last (are persistent) for more than 2-3 days.  Your throat does not get better within 7 days.  You have a fever and your symptoms suddenly get worse. Get help  right away if:  You have difficulty breathing.  You cannot swallow fluids, soft foods, or your saliva.  You have increased swelling in your throat or neck.  You have persistent nausea and vomiting. This information is not intended to replace advice given to you by your health care provider. Make sure you discuss any questions you have with your health care provider. Document Released: 10/04/2004 Document Revised: 04/22/2016 Document Reviewed: 06/17/2015 Elsevier Interactive Patient Education  [employer].

## 2018-05-19 NOTE — Progress Notes (Signed)
Karina [name] is a 37 y.o. female who presents today with concerns of sore throat for 2 days and has known sick contacts in her home with her two children having similar symptoms for 3-4 days and her youngest is new to pre- kindergarten full day school.  Review of Systems  Constitutional: Negative for chills, fever and malaise/fatigue.  HENT: Positive for ear pain and sore throat. Negative for congestion, ear discharge and sinus pain.   Eyes: Negative.   Respiratory: Negative for cough, sputum production and shortness of breath.   Cardiovascular: Negative.  Negative for chest pain.  Gastrointestinal: Negative for abdominal pain, diarrhea, nausea and vomiting.  Genitourinary: Negative for dysuria, frequency, hematuria and urgency.  Musculoskeletal: Negative for myalgias.  Skin: Negative.   Neurological: Negative for headaches.  Endo/Heme/Allergies: Negative.   Psychiatric/Behavioral: Negative.     O: Vitals:   05/19/18 0853  BP: 124/84  Pulse: 63  Resp: 18  Temp: 98 F (36.7 C)  SpO2: 97%     Physical Exam  Constitutional: She is oriented to person, place, and time. Vital signs are normal. She appears well-developed and well-nourished. She is active.  Non-toxic appearance. She does not have a sickly appearance.  HENT:  Head: Normocephalic.  Right Ear: Hearing, tympanic membrane, external ear and ear canal normal.  Left Ear: Hearing, tympanic membrane, external ear and ear canal normal.  Nose: Nose normal.  Mouth/Throat: Uvula is midline and mucous membranes are normal. Posterior oropharyngeal erythema present. No oropharyngeal exudate, posterior oropharyngeal edema or tonsillar abscesses. Tonsils are 0 on the right. Tonsils are 0 on the left. No tonsillar exudate.  Neck: Normal range of motion. Neck supple.  Cardiovascular: Normal rate, regular rhythm, normal heart sounds and normal pulses.  Pulmonary/Chest: Effort normal and breath sounds normal.  Abdominal: Soft. Bowel  sounds are normal.  Musculoskeletal: Normal range of motion.  Lymphadenopathy:       Head (right side): No submental and no submandibular adenopathy present.       Head (left side): No submental and no submandibular adenopathy present.    She has no cervical adenopathy.  Neurological: She is alert and oriented to person, place, and time.  Psychiatric: She has a normal mood and affect.  Vitals reviewed.  A: 1. Sore throat   2. Chronic right ear pain     P: Discussed exam findings, diagnosis etiology and medication use and indications reviewed with patient. Follow- Up and discharge instructions provided. No emergent/urgent issues found on exam.  Patient verbalized understanding of information provided and agrees with plan of care (POC), all questions answered.  PLAN< Warm salt water rinses Over the counter throat lozenges Tylenol/Motin for throat pain Follow up with PCP for potential ENT referral for chronic ear pain  1. Sore throat - POCT rapid strep A Results for orders placed or performed in visit on 05/19/18 (from the past 24 hour(s))  POCT rapid strep A     Status: Normal   Collection Time: 05/19/18  9:02 AM  Result Value Ref Range   Rapid Strep A Screen Negative Negative   - azelastine (ASTELIN) 0.1 % nasal spray; Place 1 spray into both nostrils 2 (two) times daily. Use in each nostril as directed  2. Chronic right ear pain - azelastine (ASTELIN) 0.1 % nasal spray; Place 1 spray into both nostrils 2 (two) times daily. Use in each nostril as directed - fluticasone (FLONASE) 50 MCG/ACT nasal spray; Place 2 sprays into both nostrils daily.

## 2018-07-03 ENCOUNTER — Telehealth: Payer: 59 | Admitting: Family

## 2018-07-03 DIAGNOSIS — H6093 Unspecified otitis externa, bilateral: Secondary | ICD-10-CM

## 2018-07-03 MED ORDER — NEOMYCIN-POLYMYXIN-HC 3.5-10000-1 OT SOLN: 4.0000 [drp] | Freq: Four times a day (QID) | OTIC | 0 refills | Status: DC

## 2018-07-03 MED ORDER — SULFAMETHOXAZOLE-TRIMETHOPRIM 800-160 MG PO TABS: 1.0000 | ORAL_TABLET | Freq: Two times a day (BID) | ORAL | 0 refills | Status: DC

## 2018-07-03 NOTE — Progress Notes (Signed)
E Visit for Swimmer's Ear  We are sorry that you are not feeling well. Here is how we plan to help!  I have prescribed: Neomycin 0.35%, polymyxin B 10,000 units/mL, and hydrocortisone 0,5% otic solution 4 drops in affected ears four times a day until completed  Bactrim 800mg /160mg  one tablet by mouth twice a day for 10 days  In certain cases swimmer's ear may progress to a more serious bacterial infection of the middle or inner ear.  If you have a fever 102 and up and significantly worsening symptoms, this could indicate a more serious infection moving to the middle/inner and needs face to face evaluation in an office by a provider.  Your symptoms should improve over the next 3 days and should resolve in about 7 days.  HOME CARE:   Wash your hands frequently.  Do not place the tip of the bottle on your ear or touch it with your fingers.  You can take Acetominophen 650 mg every 4-6 hours as needed for pain.  If pain is severe or moderate, you can apply a heating pad (set on low) or hot water bottle (wrapped in a towel) to outer ear for 20 minutes.  This will also increase drainage.  Avoid ear plugs  Do not use Q-tips  After showers, help the water run out by tilting your head to one side.  GET HELP RIGHT AWAY IF:   Fever is over 102.2 degrees.  You develop progressive ear pain or hearing loss.  Ear symptoms persist longer than 3 days after treatment.  MAKE SURE YOU:   Understand these instructions.  Will watch your condition.  Will get help right away if you are not doing well or get worse.  TO PREVENT SWIMMER'S EAR:  Use a bathing cap or custom fitted swim molds to keep your ears dry.  Towel off after swimming to dry your ears.  Tilt your head or pull your earlobes to allow the water to escape your ear canal.  If there is still water in your ears, consider using a hairdryer on the lowest setting.  Thank you for choosing an e-visit. Your e-visit answers were  reviewed by a board certified advanced clinical practitioner to complete your personal care plan. Depending upon the condition, your plan could have included both over the counter or prescription medications. Please review your pharmacy choice. Be sure that the pharmacy you have chosen is open so that you can pick up your prescription now.  If there is a problem you may message your provider in MyChart to have the prescription routed to another pharmacy. Your safety is important to [place]. If you have drug allergies check your prescription carefully.  For the next 24 hours, you can use MyChart to ask questions about today's visit, request a non-urgent call back, or ask for a work or school excuse from your e-visit provider. You will get an email in the next two days asking about your experience. I hope that your e-visit has been valuable and will speed your recovery.

## 2018-11-14 ENCOUNTER — Telehealth: Payer: 59 | Admitting: Family

## 2018-11-14 DIAGNOSIS — H9201 Otalgia, right ear: Secondary | ICD-10-CM | POA: Diagnosis not present

## 2018-11-14 DIAGNOSIS — J069 Acute upper respiratory infection, unspecified: Secondary | ICD-10-CM | POA: Diagnosis not present

## 2018-11-14 DIAGNOSIS — G8929 Other chronic pain: Secondary | ICD-10-CM

## 2018-11-14 MED ORDER — FLUTICASONE PROPIONATE 50 MCG/ACT NA SUSP: 2.0000 | Freq: Every day | NASAL | 0 refills | Status: DC

## 2018-11-14 MED ORDER — BENZONATATE 100 MG PO CAPS: 100.0000 mg | ORAL_CAPSULE | Freq: Three times a day (TID) | ORAL | 0 refills | Status: DC | PRN

## 2018-11-14 NOTE — Progress Notes (Signed)
Greater than 5 minutes, yet less than 10 minutes of time have been spent researching, coordinating, and implementing care for this patient today.  Thank you for the details you included in the comment boxes. Those details are very helpful in determining the best course of treatment for you and help us to provide the best care.  We are sorry you are not feeling well.  Here is how we plan to help!  Based on what you have shared with me, it looks like you may have a viral upper respiratory infection.  Upper respiratory infections are caused by a large number of viruses; however, rhinovirus is the most common cause.   Symptoms vary from person to person, with common symptoms including sore throat, cough, and fatigue or lack of energy.  A low-grade fever of up to 100.4 may present, but is often uncommon.  Symptoms vary however, and are closely related to a person's age or underlying illnesses.  The most common symptoms associated with an upper respiratory infection are nasal discharge or congestion, cough, sneezing, headache and pressure in the ears and face.  These symptoms usually persist for about 3 to 10 days, but can last up to 2 weeks.  It is important to know that upper respiratory infections do not cause serious illness or complications in most cases.    Upper respiratory infections can be transmitted from person to person, with the most common method of transmission being a person's hands.  The virus is able to live on the skin and can infect other persons for up to 2 hours after direct contact.  Also, these can be transmitted when someone coughs or sneezes; thus, it is important to cover the mouth to reduce this risk.  To keep the spread of the illness at bay, good hand hygiene is very important.  This is an infection that is most likely caused by a virus. There are no specific treatments other than to help you with the symptoms until the infection runs its course.  We are sorry you are not feeling  well.  Here is how we plan to help!   For nasal congestion, you may use an oral decongestants such as Mucinex D or if you have glaucoma or high blood pressure use plain Mucinex.  Saline nasal spray or nasal drops can help and can safely be used as often as needed for congestion.  For your congestion, I have prescribed Fluticasone nasal spray one spray in each nostril twice a day  If you do not have a history of heart disease, hypertension, diabetes or thyroid disease, prostate/bladder issues or glaucoma, you may also use Sudafed to treat nasal congestion.  It is highly recommended that you consult with a pharmacist or your primary care physician to ensure this medication is safe for you to take.     If you have a cough, you may use cough suppressants such as Delsym and Robitussin.  If you have glaucoma or high blood pressure, you can also use Coricidin HBP.   For cough I have prescribed for you A prescription cough medication called Tessalon Perles 100 mg. You may take 1-2 capsules every 8 hours as needed for cough  If you have a sore or scratchy throat, use a saltwater gargle-  to  teaspoon of salt dissolved in a 4-ounce to 8-ounce glass of warm water.  Gargle the solution for approximately 15-30 seconds and then spit.  It is important not to swallow the solution.  You can also   use throat lozenges/cough drops and Chloraseptic spray to help with throat pain or discomfort.  Warm or cold liquids can also be helpful in relieving throat pain.  For headache, pain or general discomfort, you can use Ibuprofen or Tylenol as directed.   Some authorities believe that zinc sprays or the use of Echinacea may shorten the course of your symptoms.   HOME CARE . Only take medications as instructed by your medical team. . Be sure to drink plenty of fluids. Water is fine as well as fruit juices, sodas and electrolyte beverages. You may want to stay away from caffeine or alcohol. If you are nauseated, try taking  small sips of liquids. How do you know if you are getting enough fluid? Your urine should be a pale yellow or almost colorless. . Get rest. . Taking a steamy shower or using a humidifier may help nasal congestion and ease sore throat pain. You can place a towel over your head and breathe in the steam from hot water coming from a faucet. . Using a saline nasal spray works much the same way. . Cough drops, hard candies and sore throat lozenges may ease your cough. . Avoid close contacts especially the very young and the elderly . Cover your mouth if you cough or sneeze . Always remember to wash your hands.   GET HELP RIGHT AWAY IF: . You develop worsening fever. . If your symptoms do not improve within 10 days . You develop yellow or green discharge from your nose over 3 days. . You have coughing fits . You develop a severe head ache or visual changes. . You develop shortness of breath, difficulty breathing or start having chest pain . Your symptoms persist after you have completed your treatment plan  MAKE SURE YOU   Understand these instructions.  Will watch your condition.  Will get help right away if you are not doing well or get worse.  Your e-visit answers were reviewed by a board certified advanced clinical practitioner to complete your personal care plan. Depending upon the condition, your plan could have included both over the counter or prescription medications. Please review your pharmacy choice. If there is a problem, you may call our nursing hot line at and have the prescription routed to another pharmacy. Your safety is important to us. If you have drug allergies check your prescription carefully.   You can use MyChart to ask questions about today's visit, request a non-urgent call back, or ask for a work or school excuse for 24 hours related to this e-Visit. If it has been greater than 24 hours you will need to follow up with your provider, or enter a new e-Visit to address  those concerns. You will get an e-mail in the next two days asking about your experience.  I hope that your e-visit has been valuable and will speed your recovery. Thank you for using e-visits.      

## 2019-03-09 DIAGNOSIS — L309 Dermatitis, unspecified: Secondary | ICD-10-CM | POA: Diagnosis not present

## 2019-03-09 DIAGNOSIS — J4520 Mild intermittent asthma, uncomplicated: Secondary | ICD-10-CM | POA: Diagnosis not present

## 2019-03-09 DIAGNOSIS — H608X3 Other otitis externa, bilateral: Secondary | ICD-10-CM | POA: Diagnosis not present

## 2019-03-09 DIAGNOSIS — E559 Vitamin D deficiency, unspecified: Secondary | ICD-10-CM | POA: Diagnosis not present

## 2019-03-09 DIAGNOSIS — Z0000 Encounter for general adult medical examination without abnormal findings: Secondary | ICD-10-CM | POA: Diagnosis not present

## 2019-03-09 DIAGNOSIS — E538 Deficiency of other specified B group vitamins: Secondary | ICD-10-CM | POA: Diagnosis not present

## 2019-03-19 DIAGNOSIS — Z01419 Encounter for gynecological examination (general) (routine) without abnormal findings: Secondary | ICD-10-CM | POA: Diagnosis not present

## 2019-03-19 DIAGNOSIS — E282 Polycystic ovarian syndrome: Secondary | ICD-10-CM | POA: Diagnosis not present

## 2019-03-19 DIAGNOSIS — E663 Overweight: Secondary | ICD-10-CM | POA: Diagnosis not present

## 2019-03-19 DIAGNOSIS — Z113 Encounter for screening for infections with a predominantly sexual mode of transmission: Secondary | ICD-10-CM | POA: Diagnosis not present

## 2019-03-24 DIAGNOSIS — N926 Irregular menstruation, unspecified: Secondary | ICD-10-CM | POA: Diagnosis not present

## 2019-03-26 DIAGNOSIS — E871 Hypo-osmolality and hyponatremia: Secondary | ICD-10-CM | POA: Insufficient documentation

## 2019-03-26 DIAGNOSIS — R7989 Other specified abnormal findings of blood chemistry: Secondary | ICD-10-CM | POA: Insufficient documentation

## 2019-03-30 ENCOUNTER — Telehealth: Payer: Self-pay | Admitting: *Deleted

## 2019-03-30 ENCOUNTER — Other Ambulatory Visit: Payer: Self-pay | Admitting: Hematology and Oncology

## 2019-03-30 ENCOUNTER — Telehealth: Payer: Self-pay | Admitting: Hematology and Oncology

## 2019-03-30 DIAGNOSIS — D508 Other iron deficiency anemias: Secondary | ICD-10-CM

## 2019-03-30 DIAGNOSIS — E559 Vitamin D deficiency, unspecified: Secondary | ICD-10-CM

## 2019-03-30 DIAGNOSIS — E519 Thiamine deficiency, unspecified: Secondary | ICD-10-CM

## 2019-03-30 DIAGNOSIS — Z9884 Bariatric surgery status: Secondary | ICD-10-CM

## 2019-03-30 DIAGNOSIS — E538 Deficiency of other specified B group vitamins: Secondary | ICD-10-CM

## 2019-03-30 NOTE — Telephone Encounter (Signed)
-----   Message from Heath Lark, MD sent at 03/30/2019  9:05 AM EDT ----- Regarding: referral She was lost to follow up in 2017 She needs labs to be done 1 week before return appt Can you call her? Labs done this week Next week, see me 15 mins and same day IV iron

## 2019-03-30 NOTE — Telephone Encounter (Signed)
Called and left message for return call

## 2019-03-30 NOTE — Telephone Encounter (Signed)
Scheduled appt per 7/20 sch message- pt aware of appts.

## 2019-04-02 ENCOUNTER — Other Ambulatory Visit: Payer: Self-pay

## 2019-04-02 ENCOUNTER — Inpatient Hospital Stay: Payer: 59 | Attending: Hematology and Oncology

## 2019-04-02 DIAGNOSIS — D509 Iron deficiency anemia, unspecified: Secondary | ICD-10-CM | POA: Diagnosis not present

## 2019-04-02 DIAGNOSIS — E559 Vitamin D deficiency, unspecified: Secondary | ICD-10-CM | POA: Insufficient documentation

## 2019-04-02 DIAGNOSIS — K909 Intestinal malabsorption, unspecified: Secondary | ICD-10-CM | POA: Insufficient documentation

## 2019-04-02 DIAGNOSIS — D508 Other iron deficiency anemias: Secondary | ICD-10-CM

## 2019-04-02 DIAGNOSIS — Z9884 Bariatric surgery status: Secondary | ICD-10-CM | POA: Diagnosis not present

## 2019-04-02 DIAGNOSIS — E519 Thiamine deficiency, unspecified: Secondary | ICD-10-CM

## 2019-04-02 DIAGNOSIS — Z79899 Other long term (current) drug therapy: Secondary | ICD-10-CM | POA: Diagnosis not present

## 2019-04-02 DIAGNOSIS — N920 Excessive and frequent menstruation with regular cycle: Secondary | ICD-10-CM | POA: Diagnosis not present

## 2019-04-02 DIAGNOSIS — R5383 Other fatigue: Secondary | ICD-10-CM | POA: Insufficient documentation

## 2019-04-02 DIAGNOSIS — E538 Deficiency of other specified B group vitamins: Secondary | ICD-10-CM

## 2019-04-02 LAB — CBC WITH DIFFERENTIAL/PLATELET
Abs Immature Granulocytes: 0.03 10*3/uL (ref 0.00–0.07)
Basophils Absolute: 0.1 10*3/uL (ref 0.0–0.1)
Basophils Relative: 1 %
Eosinophils Absolute: 0.5 10*3/uL (ref 0.0–0.5)
Eosinophils Relative: 5 %
HCT: 28 % — ABNORMAL LOW (ref 36.0–46.0)
Hemoglobin: 8.9 g/dL — ABNORMAL LOW (ref 12.0–15.0)
Immature Granulocytes: 0 %
Lymphocytes Relative: 35 %
Lymphs Abs: 3.8 10*3/uL (ref 0.7–4.0)
MCH: 18.9 pg — ABNORMAL LOW (ref 26.0–34.0)
MCHC: 31.8 g/dL (ref 30.0–36.0)
MCV: 59.6 fL — ABNORMAL LOW (ref 80.0–100.0)
Monocytes Absolute: 0.9 10*3/uL (ref 0.1–1.0)
Monocytes Relative: 8 %
Neutro Abs: 5.7 10*3/uL (ref 1.7–7.7)
Neutrophils Relative %: 51 %
Platelets: 290 10*3/uL (ref 150–400)
RBC: 4.7 MIL/uL (ref 3.87–5.11)
RDW: 18.3 % — ABNORMAL HIGH (ref 11.5–15.5)
WBC: 11 10*3/uL — ABNORMAL HIGH (ref 4.0–10.5)
nRBC: 0 % (ref 0.0–0.2)

## 2019-04-02 LAB — VITAMIN B12: Vitamin B-12: 678 pg/mL (ref 180–914)

## 2019-04-03 DIAGNOSIS — E559 Vitamin D deficiency, unspecified: Secondary | ICD-10-CM | POA: Diagnosis not present

## 2019-04-03 DIAGNOSIS — E871 Hypo-osmolality and hyponatremia: Secondary | ICD-10-CM | POA: Diagnosis not present

## 2019-04-03 DIAGNOSIS — R7989 Other specified abnormal findings of blood chemistry: Secondary | ICD-10-CM | POA: Diagnosis not present

## 2019-04-03 LAB — IRON AND TIBC
Iron: 16 ug/dL — ABNORMAL LOW (ref 41–142)
Saturation Ratios: 4 % — ABNORMAL LOW (ref 21–57)
TIBC: 445 ug/dL — ABNORMAL HIGH (ref 236–444)
UIBC: 429 ug/dL — ABNORMAL HIGH (ref 120–384)

## 2019-04-03 LAB — VITAMIN D 25 HYDROXY (VIT D DEFICIENCY, FRACTURES): Vit D, 25-Hydroxy: 12.3 ng/mL — ABNORMAL LOW (ref 30.0–100.0)

## 2019-04-03 LAB — FERRITIN: Ferritin: 7 ng/mL — ABNORMAL LOW (ref 11–307)

## 2019-04-05 LAB — VITAMIN B1: Vitamin B1 (Thiamine): 97.2 nmol/L (ref 66.5–200.0)

## 2019-04-05 LAB — COPPER, SERUM: Copper: 134 ug/dL (ref 72–166)

## 2019-04-06 ENCOUNTER — Other Ambulatory Visit: Payer: Self-pay | Admitting: Hematology and Oncology

## 2019-04-09 DIAGNOSIS — N9489 Other specified conditions associated with female genital organs and menstrual cycle: Secondary | ICD-10-CM | POA: Diagnosis not present

## 2019-04-09 DIAGNOSIS — R7989 Other specified abnormal findings of blood chemistry: Secondary | ICD-10-CM | POA: Diagnosis not present

## 2019-04-09 DIAGNOSIS — E871 Hypo-osmolality and hyponatremia: Secondary | ICD-10-CM | POA: Diagnosis not present

## 2019-04-10 ENCOUNTER — Other Ambulatory Visit: Payer: Self-pay | Admitting: Hematology and Oncology

## 2019-04-10 ENCOUNTER — Encounter: Payer: Self-pay | Admitting: Hematology and Oncology

## 2019-04-10 ENCOUNTER — Inpatient Hospital Stay: Payer: 59 | Admitting: Hematology and Oncology

## 2019-04-10 ENCOUNTER — Telehealth: Payer: Self-pay | Admitting: Hematology and Oncology

## 2019-04-10 ENCOUNTER — Other Ambulatory Visit: Payer: Self-pay

## 2019-04-10 ENCOUNTER — Inpatient Hospital Stay: Payer: 59

## 2019-04-10 VITALS — BP 112/47 | HR 64 | Temp 98.1°F | Resp 18

## 2019-04-10 DIAGNOSIS — N920 Excessive and frequent menstruation with regular cycle: Secondary | ICD-10-CM | POA: Insufficient documentation

## 2019-04-10 DIAGNOSIS — R5383 Other fatigue: Secondary | ICD-10-CM | POA: Diagnosis not present

## 2019-04-10 DIAGNOSIS — Z79899 Other long term (current) drug therapy: Secondary | ICD-10-CM

## 2019-04-10 DIAGNOSIS — D509 Iron deficiency anemia, unspecified: Secondary | ICD-10-CM | POA: Diagnosis not present

## 2019-04-10 DIAGNOSIS — E559 Vitamin D deficiency, unspecified: Secondary | ICD-10-CM

## 2019-04-10 DIAGNOSIS — K909 Intestinal malabsorption, unspecified: Secondary | ICD-10-CM

## 2019-04-10 DIAGNOSIS — D508 Other iron deficiency anemias: Secondary | ICD-10-CM

## 2019-04-10 DIAGNOSIS — Z9884 Bariatric surgery status: Secondary | ICD-10-CM

## 2019-04-10 DIAGNOSIS — D582 Other hemoglobinopathies: Secondary | ICD-10-CM

## 2019-04-10 MED ORDER — ACETAMINOPHEN 325 MG PO TABS
650.0000 mg | ORAL_TABLET | Freq: Once | ORAL | Status: AC
  Administered 2019-04-10: 650 mg via ORAL

## 2019-04-10 MED ORDER — SODIUM CHLORIDE 0.9 % IV SOLN
510.0000 mg | Freq: Once | INTRAVENOUS | Status: AC
  Administered 2019-04-10: 510 mg via INTRAVENOUS
  Filled 2019-04-10: qty 510

## 2019-04-10 MED ORDER — SODIUM CHLORIDE 0.9 % IV SOLN
Freq: Once | INTRAVENOUS | Status: AC
  Administered 2019-04-10: 10:00:00 via INTRAVENOUS
  Filled 2019-04-10: qty 250

## 2019-04-10 MED ORDER — DIPHENHYDRAMINE HCL 25 MG PO CAPS
50.0000 mg | ORAL_CAPSULE | Freq: Once | ORAL | Status: AC
  Administered 2019-04-10: 50 mg via ORAL

## 2019-04-10 MED ORDER — ACETAMINOPHEN 325 MG PO TABS
ORAL_TABLET | ORAL | Status: AC
  Filled 2019-04-10: qty 2

## 2019-04-10 MED ORDER — DIPHENHYDRAMINE HCL 25 MG PO CAPS
ORAL_CAPSULE | ORAL | Status: AC
  Filled 2019-04-10: qty 2

## 2019-04-10 NOTE — Progress Notes (Signed)
Per Dr. Alvy Bimler, only give PO benadryl in premeds list.

## 2019-04-10 NOTE — Progress Notes (Signed)
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 Repeat iron studies came back low The most likely cause of her anemia is due to chronic blood loss/malabsorption syndrome. We discussed some of the risks, benefits, and alternatives of intravenous iron infusions. The patient is symptomatic from anemia and the iron level is critically low. She tolerated oral iron supplement poorly and desires to achieved higher levels of iron faster for adequate hematopoesis. Some of the side-effects to be expected including risks of infusion reactions, phlebitis, headaches, nausea and fatigue.  The patient is willing to proceed. Patient education material was dispensed.  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  Menorrhagia with regular cycle She has severe menorrhagia The patient is undecided whether she wants to have more children I recommend her to discuss with her gynecologist for further management of menorrhagia   No orders of the defined types were placed in this encounter.   INTERVAL HISTORY: Karina [name] 38 y.o. female returns for recurrent iron deficiency anemia I have not  seen her for several years She has not have her blood count checked lately until recently She complains of fatigue She has significant menorrhagia with regular cycles She does not know whether she wants to have anymore children She did not discuss with her gynecologist about management of menorrhagia She was also found to have severe vitamin D deficiency, currently managed through her endocrinologist office She denies chest pain, dizziness or significant shortness of breath The patient denies any recent signs or symptoms of bleeding such as spontaneous epistaxis, hematuria or hematochezia.   SUMMARY OF HEMATOLOGIC HISTORY:  Karina [name] is here because of severe iron deficiency She has diagnosis of thalassemia and history of bariatric surgery causing multiple mineral deficiencies: specifically, B12, iron, thiamine and vitamin D Her baseline blood work in July 2015 was normal. She had no recent labs since February 2017. She was found to have severe iron deficiency anemia Recently, she desired to become pregnant. She has a daughter, born in [id number]. Soon after that, she has an early trimester miscarriage She was evaluated by GYN and is currently on fertility treatment She was found to have abnormal CBC from recent blood work. She is recommended Iv iron therapy prior to becoming pregnant She complained of recent chest pain on exertion, shortness of breath on minimal exertion, pre-syncopal episodes, fatigue and palpitations. She had not noticed any recent bleeding such as epistaxis, hematuria or hematochezia. She had history of menorhagia The patient denies over the counter NSAID ingestion. She is not on antiplatelets agents. Her last colonoscopy was normal on 02/26/13. She had EGD on 08/28/12 which showed gastritis She had no prior history or diagnosis of cancer. Her age appropriate screening programs  are up-to-date. 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 She never donated blood or received blood transfusion The patient was prescribed oral iron supplements and she takes three daily but it caused severe constipation without success of improving her anemia She received 2 doses of intravenous iron in July 2017 with improvement of ferritin level  I have reviewed the past medical history, past surgical history, social history and family history with the patient and they are unchanged from previous note.  ALLERGIES:  has No Known Allergies.  MEDICATIONS:  Current Outpatient Medications  Medication Sig Dispense Refill  . acetaminophen (TYLENOL) 500 MG tablet Take 500 mg by mouth every 6 (six) hours as needed for mild pain, moderate pain or headache.    . albuterol (PROVENTIL HFA;VENTOLIN HFA) 108 (90 BASE) MCG/ACT inhaler Inhale 1-2 puffs into the lungs every 6 (six) hours as needed for wheezing or shortness of breath.     [name]. azelastine (ASTELIN) 0.1 % nasal spray Place 1 spray into both nostrils 2 (two) times daily. Use in each nostril as directed 30 mL 0  . benzonatate (TESSALON PERLES) 100 MG capsule Take 1-2 capsules (100-200 mg total) by mouth every 8 (eight) hours as needed for cough. 30 capsule 0  . Cholecalciferol (VITAMIN D3) 50000 units CAPS Take 1 tablet by mouth 2 (two) times a week.    . famotidine (PEPCID) 20 MG tablet Take 40 mg by mouth daily.     . fluticasone (FLONASE) 50 MCG/ACT nasal spray Place 2 sprays into both nostrils daily. 16 g 0  . hydrocortisone cream 1 % Apply 1 application topically 2 (two) times daily as needed for itching.    [name]. ibuprofen (ADVIL,MOTRIN) 600 MG tablet 1 po pc every 6 hours for  3 days then prn-pain (Patient not taking: Reported on 01/23/2018) 30 tablet 2  . neomycin-polymyxin-hydrocortisone (CORTISPORIN) OTIC solution Place 4 drops into the left ear 4 (four) times daily. 10 mL 0  . oxyCODONE (ROXICODONE) 5 MG immediate release tablet 1  po every 6 hours as needed for post operative pain (Patient not  taking: Reported on 01/23/2018) 12 tablet 0  . Prenatal Multivit-Min-Fe-FA (PRENATAL VITAMINS) 0.8 MG tablet Take 1 tablet by mouth daily.    [name]. sulfamethoxazole-trimethoprim (BACTRIM DS,SEPTRA DS) 800-160 MG tablet Take 1 tablet by mouth 2 (two) times daily. 20 tablet 0  . triamcinolone cream (KENALOG) 0.1 % Apply 1 application topically 2 (two) times daily. (Patient not taking: Reported on 05/19/2018) 30 g 0   No current facility-administered medications for this visit.      REVIEW OF SYSTEMS:   Constitutional: Denies fevers, chills or night sweats Eyes: Denies blurriness of vision Ears, nose, mouth, throat, and face: Denies mucositis or sore throat Respiratory: Denies cough, dyspnea or wheezes Cardiovascular: Denies palpitation, chest discomfort or lower extremity swelling Gastrointestinal:  Denies nausea, heartburn or change in bowel habits Skin: Denies abnormal skin rashes Lymphatics: Denies new lymphadenopathy or easy bruising Neurological:Denies numbness, tingling or new weaknesses Behavioral/Psych: Mood is stable, no new changes  All other systems were reviewed with the patient and are negative.  PHYSICAL EXAMINATION: ECOG PERFORMANCE STATUS: 1 - Symptomatic but completely ambulatory  Vitals:   04/10/19 0840  BP: (!) 141/74  Pulse: 92  Resp: 18  Temp: 98.3 F (36.8 C)  SpO2: 100%   Filed Weights   04/10/19 0840  Weight: (!) 311 lb 9.6 oz (141.3 kg)    GENERAL:alert, no distress  and comfortable.  Limited exam due to body habitus SKIN: skin color, texture, turgor are normal, no rashes or significant lesions EYES: normal, Conjunctiva are pale and non-injected, sclera clear OROPHARYNX:no exudate, no erythema and lips, buccal mucosa, and tongue normal  NECK: supple, thyroid normal size, non-tender, without nodularity LYMPH:  no palpable lymphadenopathy in the cervical, axillary or inguinal LUNGS: clear to auscultation and percussion with normal breathing effort HEART:  regular rate & rhythm with soft systolic murmur on the left sternal border and no lower extremity edema ABDOMEN:abdomen soft, non-tender and normal bowel sounds Musculoskeletal:no cyanosis of digits and no clubbing  NEURO: alert & oriented x 3 with fluent speech, no focal motor/sensory deficits  LABORATORY DATA:  I have reviewed the data as listed     Component Value Date/Time   NA 137 08/09/2017 1500   K 4.4 08/09/2017 1500   CL 103 08/09/2017 1500   CO2 29 08/09/2017 1500   GLUCOSE 94 08/09/2017 1500   BUN 12 08/09/2017 1500   CREATININE 0.73 08/09/2017 1500   CALCIUM 8.6 (L) 08/09/2017 1500   PROT 7.9 08/21/2012 1010   ALBUMIN 3.2 (L) 08/21/2012 1010   AST 30 08/21/2012 1010   ALT 41 (H) 08/21/2012 1010   ALKPHOS 131 (H) 08/21/2012 1010   BILITOT 0.5 08/21/2012 1010   GFRNONAA >60 08/09/2017 1500   GFRAA >60 08/09/2017 1500    No results found for: SPEP, UPEP  Lab Results  Component Value Date   WBC 11.0 (H) 04/02/2019   NEUTROABS 5.7 04/02/2019   HGB 8.9 (L) 04/02/2019   HCT 28.0 (L) 04/02/2019   MCV 59.6 (L) 04/02/2019   PLT 290 04/02/2019      Chemistry      Component Value Date/Time   NA 137 08/09/2017 1500   K 4.4 08/09/2017 1500   CL 103 08/09/2017 1500   CO2 29 08/09/2017 1500   BUN 12 08/09/2017 1500   CREATININE 0.73 08/09/2017 1500      Component Value Date/Time   CALCIUM 8.6 (L) 08/09/2017 1500   ALKPHOS 131 (H) 08/21/2012 1010   AST 30 08/21/2012 1010   ALT 41 (H) 08/21/2012 1010   BILITOT 0.5 08/21/2012 1010      I spent 15 minutes counseling the patient face to face. The total time spent in the appointment was 20 minutes and more than 50% was on counseling.   All questions were answered. The patient knows to call the clinic with any problems, questions or concerns. No barriers to learning was detected.    Heath Lark, MD 7/31/20202:10 PM

## 2019-04-10 NOTE — Assessment & Plan Note (Signed)
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 Repeat iron studies came back low The most likely cause of her anemia is due to chronic blood loss/malabsorption syndrome. We discussed some of the risks, benefits, and alternatives of intravenous iron infusions. The patient is symptomatic from anemia and the iron level is critically low. She tolerated oral iron supplement poorly and desires to achieved higher levels of iron faster for adequate hematopoesis. Some of the side-effects to be expected including risks of infusion reactions, phlebitis, headaches, nausea and fatigue.  The patient is willing to proceed. Patient education material was dispensed.  Goal is to keep ferritin level greater than 50 and resolution of anemia  I recommend consideration to get treatment for menorrhagia I will discuss this with her again next month I will schedule her to get another iron infusion next week

## 2019-04-10 NOTE — Assessment & Plan Note (Signed)
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

## 2019-04-10 NOTE — Assessment & Plan Note (Signed)
She has severe menorrhagia The patient is undecided whether she wants to have more children I recommend her to discuss with her gynecologist for further management of menorrhagia

## 2019-04-10 NOTE — Telephone Encounter (Signed)
I talk with patient regarding schedule  

## 2019-04-10 NOTE — Assessment & Plan Note (Signed)
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

## 2019-04-10 NOTE — Patient Instructions (Signed)

## 2019-04-14 ENCOUNTER — Telehealth: Payer: 59 | Admitting: Family

## 2019-04-14 DIAGNOSIS — J029 Acute pharyngitis, unspecified: Secondary | ICD-10-CM

## 2019-04-14 NOTE — Progress Notes (Signed)
We are sorry that you are not feeling well.  Here is how we plan to help!  Your symptoms indicate a likely viral infection (Pharyngitis).   Pharyngitis is inflammation in the back of the throat which can cause a sore throat, scratchiness and sometimes difficulty swallowing.   Pharyngitis is typically caused by a respiratory virus and will just run its course.  Please keep in mind that your symptoms could last up to 10 days.  For throat pain, we recommend over the counter oral pain relief medications such as acetaminophen or aspirin, or anti-inflammatory medications such as ibuprofen or naproxen sodium.  Topical treatments such as oral throat lozenges or sprays may be used as needed.  Avoid close contact with loved ones, especially the very young and elderly.  Remember to wash your hands thoroughly throughout the day as this is the number one way to prevent the spread of infection and wipe down door knobs and counters with disinfectant.  After careful review of your answers, I would not recommend and antibiotic for your condition.  Antibiotics should not be used to treat conditions that we suspect are caused by viruses like the virus that causes the common cold or flu. However, some people can have Strep with atypical symptoms. You may need formal testing in clinic or office to confirm if your symptoms continue or worsen.  Providers prescribe antibiotics to treat infections caused by bacteria. Antibiotics are very powerful in treating bacterial infections when they are used properly.  To maintain their effectiveness, they should be used only when necessary.  Overuse of antibiotics has resulted in the development of super bugs that are resistant to treatment!    Home Care:  Only take medications as instructed by your medical team.  Do not drink alcohol while taking these medications.  A steam or ultrasonic humidifier can help congestion.  You can place a towel over your head and breathe in the steam from  hot water coming from a faucet.  Avoid close contacts especially the very young and the elderly.  Cover your mouth when you cough or sneeze.  Always remember to wash your hands.  Get Help Right Away If:  You develop worsening fever or throat pain.  You develop a severe head ache or visual changes.  Your symptoms persist after you have completed your treatment plan.  Make sure you  Understand these instructions.  Will watch your condition.  Will get help right away if you are not doing well or get worse.  Your e-visit answers were reviewed by a board certified advanced clinical practitioner to complete your personal care plan.  Depending on the condition, your plan could have included both over the counter or prescription medications.  If there is a problem please reply  once you have received a response from your provider.  Your safety is important to us.  If you have drug allergies check your prescription carefully.    You can use MyChart to ask questions about todays visit, request a non-urgent call back, or ask for a work or school excuse for 24 hours related to this e-Visit. If it has been greater than 24 hours you will need to follow up with your provider, or enter a new e-Visit to address those concerns.  You will get an e-mail in the next two days asking about your experience.  I hope that your e-visit has been valuable and will speed your recovery. Thank you for using e-visits.  Greater than 5 minutes, yet   less than 10 minutes of time have been spent researching, coordinating, and implementing care for this patient today.  Thank you for the details you included in the comment boxes. Those details are very helpful in determining the best course of treatment for you and help us to provide the best care.  

## 2019-04-16 DIAGNOSIS — R7989 Other specified abnormal findings of blood chemistry: Secondary | ICD-10-CM | POA: Diagnosis not present

## 2019-04-16 DIAGNOSIS — Z9049 Acquired absence of other specified parts of digestive tract: Secondary | ICD-10-CM | POA: Diagnosis not present

## 2019-04-16 DIAGNOSIS — K769 Liver disease, unspecified: Secondary | ICD-10-CM | POA: Diagnosis not present

## 2019-04-16 DIAGNOSIS — R160 Hepatomegaly, not elsewhere classified: Secondary | ICD-10-CM | POA: Diagnosis not present

## 2019-04-17 ENCOUNTER — Other Ambulatory Visit: Payer: Self-pay

## 2019-04-17 ENCOUNTER — Other Ambulatory Visit: Payer: Self-pay | Admitting: Hematology and Oncology

## 2019-04-17 ENCOUNTER — Inpatient Hospital Stay: Payer: 59 | Attending: Hematology and Oncology

## 2019-04-17 ENCOUNTER — Ambulatory Visit: Payer: 59

## 2019-04-17 VITALS — BP 103/64 | HR 60 | Temp 97.9°F | Resp 18

## 2019-04-17 DIAGNOSIS — D508 Other iron deficiency anemias: Secondary | ICD-10-CM

## 2019-04-17 DIAGNOSIS — D509 Iron deficiency anemia, unspecified: Secondary | ICD-10-CM | POA: Diagnosis not present

## 2019-04-17 DIAGNOSIS — Z79899 Other long term (current) drug therapy: Secondary | ICD-10-CM | POA: Insufficient documentation

## 2019-04-17 MED ORDER — DIPHENHYDRAMINE HCL 25 MG PO CAPS
ORAL_CAPSULE | ORAL | Status: AC
  Filled 2019-04-17: qty 2

## 2019-04-17 MED ORDER — METHYLPREDNISOLONE SODIUM SUCC 125 MG IJ SOLR
INTRAMUSCULAR | Status: AC
  Filled 2019-04-17: qty 2

## 2019-04-17 MED ORDER — SODIUM CHLORIDE 0.9 % IV SOLN
510.0000 mg | Freq: Once | INTRAVENOUS | Status: AC
  Administered 2019-04-17: 510 mg via INTRAVENOUS
  Filled 2019-04-17: qty 17

## 2019-04-17 MED ORDER — ACETAMINOPHEN 325 MG PO TABS
ORAL_TABLET | ORAL | Status: AC
  Filled 2019-04-17: qty 2

## 2019-04-17 MED ORDER — METHYLPREDNISOLONE SODIUM SUCC 125 MG IJ SOLR
60.0000 mg | Freq: Once | INTRAMUSCULAR | Status: AC
  Administered 2019-04-17: 60 mg via INTRAVENOUS

## 2019-04-17 MED ORDER — ACETAMINOPHEN 325 MG PO TABS
650.0000 mg | ORAL_TABLET | Freq: Once | ORAL | Status: AC
  Administered 2019-04-17: 650 mg via ORAL

## 2019-04-17 MED ORDER — SODIUM CHLORIDE 0.9 % IV SOLN
Freq: Once | INTRAVENOUS | Status: AC
  Administered 2019-04-17: 10:00:00 via INTRAVENOUS
  Filled 2019-04-17: qty 250

## 2019-04-17 MED ORDER — DIPHENHYDRAMINE HCL 25 MG PO CAPS
50.0000 mg | ORAL_CAPSULE | Freq: Once | ORAL | Status: AC
  Administered 2019-04-17: 50 mg via ORAL

## 2019-04-17 NOTE — Progress Notes (Signed)
Attempted IV stick in right anterior forearm unsuccessfully.  Catheter d/c intact, site unremarkable.  Pt voiced no complaints.

## 2019-04-17 NOTE — Progress Notes (Signed)
Pt states she had tightness in her chest toward the end of her last feraheme infusion which lasted for a short time afterward.  No note found in chart about this incident.  Dr. Alvy Bimler spoke with pt this a.m., will order extra pre medication for today's treatment.  Dr. Alvy Bimler prefers that pt have benadryl po instead of IV.

## 2019-04-27 ENCOUNTER — Telehealth: Payer: Self-pay | Admitting: Hematology and Oncology

## 2019-04-27 NOTE — Telephone Encounter (Signed)
Called pt per 8/14 sch message - no answer . Left message for patient to call back to reschedule appt .

## 2019-05-15 ENCOUNTER — Ambulatory Visit: Payer: 59

## 2019-05-15 ENCOUNTER — Ambulatory Visit: Payer: 59 | Admitting: Hematology and Oncology

## 2019-05-15 ENCOUNTER — Other Ambulatory Visit: Payer: 59

## 2019-05-21 ENCOUNTER — Other Ambulatory Visit: Payer: Self-pay | Admitting: Hematology and Oncology

## 2019-05-21 DIAGNOSIS — D508 Other iron deficiency anemias: Secondary | ICD-10-CM

## 2019-05-21 DIAGNOSIS — E559 Vitamin D deficiency, unspecified: Secondary | ICD-10-CM

## 2019-05-22 ENCOUNTER — Other Ambulatory Visit: Payer: Self-pay

## 2019-05-22 ENCOUNTER — Encounter: Payer: Self-pay | Admitting: Hematology and Oncology

## 2019-05-22 ENCOUNTER — Inpatient Hospital Stay (HOSPITAL_BASED_OUTPATIENT_CLINIC_OR_DEPARTMENT_OTHER): Payer: 59 | Admitting: Hematology and Oncology

## 2019-05-22 ENCOUNTER — Inpatient Hospital Stay: Payer: 59

## 2019-05-22 ENCOUNTER — Inpatient Hospital Stay: Payer: 59 | Attending: Hematology and Oncology

## 2019-05-22 VITALS — BP 111/57 | HR 64 | Temp 98.2°F | Resp 18

## 2019-05-22 DIAGNOSIS — D509 Iron deficiency anemia, unspecified: Secondary | ICD-10-CM | POA: Insufficient documentation

## 2019-05-22 DIAGNOSIS — D508 Other iron deficiency anemias: Secondary | ICD-10-CM

## 2019-05-22 DIAGNOSIS — Z79899 Other long term (current) drug therapy: Secondary | ICD-10-CM | POA: Diagnosis not present

## 2019-05-22 DIAGNOSIS — Z9884 Bariatric surgery status: Secondary | ICD-10-CM | POA: Diagnosis not present

## 2019-05-22 DIAGNOSIS — N920 Excessive and frequent menstruation with regular cycle: Secondary | ICD-10-CM | POA: Diagnosis not present

## 2019-05-22 DIAGNOSIS — E559 Vitamin D deficiency, unspecified: Secondary | ICD-10-CM

## 2019-05-22 LAB — CBC WITH DIFFERENTIAL/PLATELET
Abs Immature Granulocytes: 0.02 10*3/uL (ref 0.00–0.07)
Basophils Absolute: 0.1 10*3/uL (ref 0.0–0.1)
Basophils Relative: 1 %
Eosinophils Absolute: 0.4 10*3/uL (ref 0.0–0.5)
Eosinophils Relative: 4 %
HCT: 35.5 % — ABNORMAL LOW (ref 36.0–46.0)
Hemoglobin: 11.7 g/dL — ABNORMAL LOW (ref 12.0–15.0)
Immature Granulocytes: 0 %
Lymphocytes Relative: 40 %
Lymphs Abs: 3.5 10*3/uL (ref 0.7–4.0)
MCH: 23.3 pg — ABNORMAL LOW (ref 26.0–34.0)
MCHC: 33 g/dL (ref 30.0–36.0)
MCV: 70.6 fL — ABNORMAL LOW (ref 80.0–100.0)
Monocytes Absolute: 0.7 10*3/uL (ref 0.1–1.0)
Monocytes Relative: 8 %
Neutro Abs: 4.1 10*3/uL (ref 1.7–7.7)
Neutrophils Relative %: 47 %
Platelets: 253 10*3/uL (ref 150–400)
RBC: 5.03 MIL/uL (ref 3.87–5.11)
WBC: 8.6 10*3/uL (ref 4.0–10.5)
nRBC: 0 % (ref 0.0–0.2)

## 2019-05-22 LAB — IRON AND TIBC
Iron: 66 ug/dL (ref 41–142)
Saturation Ratios: 24 % (ref 21–57)
TIBC: 281 ug/dL (ref 236–444)
UIBC: 215 ug/dL (ref 120–384)

## 2019-05-22 LAB — FERRITIN: Ferritin: 102 ng/mL (ref 11–307)

## 2019-05-22 MED ORDER — DIPHENHYDRAMINE HCL 50 MG/ML IJ SOLN
25.0000 mg | Freq: Once | INTRAMUSCULAR | Status: AC
  Administered 2019-05-22: 25 mg via INTRAVENOUS

## 2019-05-22 MED ORDER — SODIUM CHLORIDE 0.9 % IV SOLN
Freq: Once | INTRAVENOUS | Status: AC
  Administered 2019-05-22: 15:00:00 via INTRAVENOUS
  Filled 2019-05-22: qty 250

## 2019-05-22 MED ORDER — DIPHENHYDRAMINE HCL 50 MG/ML IJ SOLN
INTRAMUSCULAR | Status: AC
  Filled 2019-05-22: qty 1

## 2019-05-22 MED ORDER — METHYLPREDNISOLONE SODIUM SUCC 125 MG IJ SOLR
40.0000 mg | Freq: Once | INTRAMUSCULAR | Status: AC
  Administered 2019-05-22: 40 mg via INTRAVENOUS

## 2019-05-22 MED ORDER — METHYLPREDNISOLONE SODIUM SUCC 125 MG IJ SOLR
INTRAMUSCULAR | Status: AC
  Filled 2019-05-22: qty 2

## 2019-05-22 MED ORDER — SODIUM CHLORIDE 0.9 % IV SOLN
510.0000 mg | Freq: Once | INTRAVENOUS | Status: AC
  Administered 2019-05-22: 510 mg via INTRAVENOUS
  Filled 2019-05-22: qty 510

## 2019-05-22 NOTE — Progress Notes (Signed)
Confirmed with MD - 9/4 infusion appts will not be rescheduled. MD recommends proceeding with Feraheme x 1 today.  Hardie Pulley, PharmD, BCPS, BCOP

## 2019-05-22 NOTE — Patient Instructions (Signed)

## 2019-05-22 NOTE — Assessment & Plan Note (Signed)
She has recurrent severe menorrhagia The patient is undecided whether she wants to have more children I recommend her to discuss with her gynecologist for further management of menorrhagia 

## 2019-05-22 NOTE — Assessment & Plan Note (Signed)
Her anemia has improved I recommend 1 more dose of intravenous iron today along with premedication Due to her ongoing menorrhagia, I recommend recheck iron studies in 3 months

## 2019-05-22 NOTE — Progress Notes (Signed)
[facility] OFFICE PROGRESS NOTE  [name], Sindy Messing, PA-C  ASSESSMENT & PLAN:  Iron deficiency anemia Her anemia has improved I recommend 1 more dose of intravenous iron today along with premedication Due to her ongoing menorrhagia, I recommend recheck iron studies in 3 months   Menorrhagia with regular cycle She has recurrent severe menorrhagia The patient is undecided whether she wants to have more children I recommend her to discuss with her gynecologist for further management of menorrhagia   No orders of the defined types were placed in this encounter.   INTERVAL HISTORY: Karina [name] 38 y.o. female returns for for further follow-up She tolerated IV iron well although she did have mild infusion reaction which resolved with premedication She continues to have recurrent occasional menorrhagia The patient denies any recent signs or symptoms of bleeding such as spontaneous epistaxis, hematuria or hematochezia. She complained of excessive fatigue  SUMMARY OF HEMATOLOGIC HISTORY: Karina [name] is here because of severe iron deficiency She has diagnosis of thalassemia and history of bariatric surgery causing multiple mineral deficiencies: specifically, B12, iron, thiamine and vitamin D Her baseline blood work in July 2015 was normal. She had no recent labs since February 2017. She was found to have severe iron deficiency anemia Recently, she desired to become pregnant. She has a daughter, born in [birth year]. Soon after that, she has an early trimester miscarriage She was evaluated by GYN and is currently on fertility treatment She was found to have abnormal CBC from recent blood work. She is recommended Iv iron therapy prior to becoming pregnant She complained of recent chest pain on exertion, shortness of breath on minimal exertion, pre-syncopal episodes, fatigue and palpitations. She had not noticed any recent bleeding such as epistaxis, hematuria or  hematochezia. She had history of menorhagia The patient denies over the counter NSAID ingestion. She is not on antiplatelets agents. Her last colonoscopy was normal on 02/26/13. She had EGD on 08/28/12 which showed gastritis She had no prior history or diagnosis of cancer. Her age appropriate screening programs are up-to-date. 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 She never donated blood or received blood transfusion The patient was prescribed oral iron supplements and she takes three daily but it caused severe constipation without success of improving her anemia She received 2 doses of intravenous iron in July 2017 with improvement of ferritin level She was lost to follow-up.  In August 2020, she received further intravenous iron for recurrent iron deficiency  I have reviewed the past medical history, past surgical history, social history and family history with the patient and they are unchanged from previous note.  ALLERGIES:  has No Known Allergies.  MEDICATIONS:  Current Outpatient Medications  Medication Sig Dispense Refill  . acetaminophen (TYLENOL) 500 MG tablet Take 500 mg by mouth every 6 (six) hours as needed for mild pain, moderate pain or headache.    . albuterol (PROVENTIL HFA;VENTOLIN HFA) 108 (90 BASE) MCG/ACT inhaler Inhale 1-2 puffs into the lungs every 6 (six) hours as needed for wheezing or shortness of breath.     [name] azelastine (ASTELIN) 0.1 % nasal spray Place 1 spray into both nostrils 2 (two) times daily. Use in each nostril as directed 30 mL 0  . benzonatate (TESSALON PERLES) 100 MG capsule Take 1-2 capsules (100-200 mg total) by mouth every 8 (eight) hours as needed for cough. 30 capsule 0  . Cholecalciferol (VITAMIN D3) 50000 units  CAPS Take 1 tablet by mouth 2 (two) times a week.    . famotidine (PEPCID) 20 MG tablet Take 40 mg by mouth daily.     . fluticasone (FLONASE) 50 MCG/ACT nasal spray Place 2 sprays into both  nostrils daily. 16 g 0  . hydrocortisone cream 1 % Apply 1 application topically 2 (two) times daily as needed for itching.    [name]. ibuprofen (ADVIL,MOTRIN) 600 MG tablet 1 po pc every 6 hours for  3 days then prn-pain (Patient not taking: Reported on 01/23/2018) 30 tablet 2  . neomycin-polymyxin-hydrocortisone (CORTISPORIN) OTIC solution Place 4 drops into the left ear 4 (four) times daily. 10 mL 0  . oxyCODONE (ROXICODONE) 5 MG immediate release tablet 1  po every 6 hours as needed for post operative pain (Patient not taking: Reported on 01/23/2018) 12 tablet 0  . Prenatal Multivit-Min-Fe-FA (PRENATAL VITAMINS) 0.8 MG tablet Take 1 tablet by mouth daily.    [name]. sulfamethoxazole-trimethoprim (BACTRIM DS,SEPTRA DS) 800-160 MG tablet Take 1 tablet by mouth 2 (two) times daily. 20 tablet 0  . triamcinolone cream (KENALOG) 0.1 % Apply 1 application topically 2 (two) times daily. (Patient not taking: Reported on 05/19/2018) 30 g 0   No current facility-administered medications for this visit.      REVIEW OF SYSTEMS:   Constitutional: Denies fevers, chills or night sweats Eyes: Denies blurriness of vision Ears, nose, mouth, throat, and face: Denies mucositis or sore throat Respiratory: Denies cough, dyspnea or wheezes Cardiovascular: Denies palpitation, chest discomfort or lower extremity swelling Gastrointestinal:  Denies nausea, heartburn or change in bowel habits Skin: Denies abnormal skin rashes Lymphatics: Denies new lymphadenopathy or easy bruising Neurological:Denies numbness, tingling or new weaknesses Behavioral/Psych: Mood is stable, no new changes  All other systems were reviewed with the patient and are negative.  PHYSICAL EXAMINATION: ECOG PERFORMANCE STATUS: 1 - Symptomatic but completely ambulatory  Vitals:   05/22/19 1343  BP: 115/77  Pulse: 79  Resp: 18  Temp: 99.2 F (37.3 C)  SpO2: 100%   Filed Weights   05/22/19 1343  Weight: (!) 308 lb 6.4 oz (139.9 kg)     GENERAL:alert, no distress and comfortable Musculoskeletal:no cyanosis of digits and no clubbing  NEURO: alert & oriented x 3 with fluent speech, no focal motor/sensory deficits  LABORATORY DATA:  I have reviewed the data as listed     Component Value Date/Time   NA 137 08/09/2017 1500   K 4.4 08/09/2017 1500   CL 103 08/09/2017 1500   CO2 29 08/09/2017 1500   GLUCOSE 94 08/09/2017 1500   BUN 12 08/09/2017 1500   CREATININE 0.73 08/09/2017 1500   CALCIUM 8.6 (L) 08/09/2017 1500   PROT 7.9 08/21/2012 1010   ALBUMIN 3.2 (L) 08/21/2012 1010   AST 30 08/21/2012 1010   ALT 41 (H) 08/21/2012 1010   ALKPHOS 131 (H) 08/21/2012 1010   BILITOT 0.5 08/21/2012 1010   GFRNONAA >60 08/09/2017 1500   GFRAA >60 08/09/2017 1500    No results found for: SPEP, UPEP  Lab Results  Component Value Date   WBC 8.6 05/22/2019   NEUTROABS 4.1 05/22/2019   HGB 11.7 (L) 05/22/2019   HCT 35.5 (L) 05/22/2019   MCV 70.6 (L) 05/22/2019   PLT 253 05/22/2019      Chemistry      Component Value Date/Time   NA 137 08/09/2017 1500   K 4.4 08/09/2017 1500   CL 103 08/09/2017 1500   CO2 29 08/09/2017 1500  BUN 12 08/09/2017 1500   CREATININE 0.73 08/09/2017 1500      Component Value Date/Time   CALCIUM 8.6 (L) 08/09/2017 1500   ALKPHOS 131 (H) 08/21/2012 1010   AST 30 08/21/2012 1010   ALT 41 (H) 08/21/2012 1010   BILITOT 0.5 08/21/2012 1010      I spent 10 minutes counseling the patient face to face. The total time spent in the appointment was 15 minutes and more than 50% was on counseling.   All questions were answered. The patient knows to call the clinic with any problems, questions or concerns. No barriers to learning was detected.    Artis Delay, MD 9/11/20204:23 PM

## 2019-05-23 LAB — VITAMIN D 25 HYDROXY (VIT D DEFICIENCY, FRACTURES): Vit D, 25-Hydroxy: 24.2 ng/mL — ABNORMAL LOW (ref 30.0–100.0)

## 2019-05-25 ENCOUNTER — Telehealth: Payer: Self-pay | Admitting: Hematology and Oncology

## 2019-05-25 ENCOUNTER — Telehealth: Payer: Self-pay | Admitting: *Deleted

## 2019-05-25 NOTE — Telephone Encounter (Signed)
I talk with patient regarding schedule  

## 2019-05-25 NOTE — Telephone Encounter (Signed)
-----   Message from Heath Lark, MD sent at 05/25/2019  8:39 AM EDT ----- Regarding: vitamin D is low but better She told me last week she is seeing her PCP for recheck vitamin D We did check her vitamin D level last week and it is better Please let her know the result

## 2019-05-25 NOTE — Telephone Encounter (Signed)
Patient called and advised lab results as directed below. She states her PCP has also been checking her vitamin D.

## 2019-06-23 DIAGNOSIS — N979 Female infertility, unspecified: Secondary | ICD-10-CM | POA: Diagnosis not present

## 2019-06-23 DIAGNOSIS — Z1231 Encounter for screening mammogram for malignant neoplasm of breast: Secondary | ICD-10-CM | POA: Diagnosis not present

## 2019-07-23 DIAGNOSIS — E559 Vitamin D deficiency, unspecified: Secondary | ICD-10-CM | POA: Diagnosis not present

## 2019-07-23 DIAGNOSIS — E871 Hypo-osmolality and hyponatremia: Secondary | ICD-10-CM | POA: Diagnosis not present

## 2019-07-23 DIAGNOSIS — R7989 Other specified abnormal findings of blood chemistry: Secondary | ICD-10-CM | POA: Diagnosis not present

## 2019-07-23 DIAGNOSIS — L309 Dermatitis, unspecified: Secondary | ICD-10-CM | POA: Diagnosis not present

## 2019-07-23 DIAGNOSIS — F4322 Adjustment disorder with anxiety: Secondary | ICD-10-CM | POA: Diagnosis not present

## 2019-07-24 DIAGNOSIS — E871 Hypo-osmolality and hyponatremia: Secondary | ICD-10-CM | POA: Diagnosis not present

## 2019-07-24 DIAGNOSIS — N979 Female infertility, unspecified: Secondary | ICD-10-CM | POA: Diagnosis not present

## 2019-07-24 DIAGNOSIS — R945 Abnormal results of liver function studies: Secondary | ICD-10-CM | POA: Diagnosis not present

## 2019-08-19 ENCOUNTER — Telehealth: Payer: Self-pay | Admitting: Hematology and Oncology

## 2019-08-19 NOTE — Telephone Encounter (Signed)
Returned patient's phone call regarding rescheduling 12/11 appointment, per patient's request appointment has been moved to 12/14.

## 2019-08-21 ENCOUNTER — Inpatient Hospital Stay: Payer: 59

## 2019-08-24 ENCOUNTER — Other Ambulatory Visit: Payer: Self-pay | Admitting: Hematology and Oncology

## 2019-08-24 ENCOUNTER — Inpatient Hospital Stay: Payer: 59 | Attending: Hematology and Oncology

## 2019-08-24 ENCOUNTER — Other Ambulatory Visit: Payer: Self-pay

## 2019-08-24 DIAGNOSIS — D508 Other iron deficiency anemias: Secondary | ICD-10-CM

## 2019-08-24 DIAGNOSIS — E538 Deficiency of other specified B group vitamins: Secondary | ICD-10-CM

## 2019-08-24 DIAGNOSIS — E559 Vitamin D deficiency, unspecified: Secondary | ICD-10-CM

## 2019-08-24 DIAGNOSIS — D509 Iron deficiency anemia, unspecified: Secondary | ICD-10-CM | POA: Insufficient documentation

## 2019-08-24 LAB — CBC WITH DIFFERENTIAL/PLATELET
Abs Immature Granulocytes: 0.04 10*3/uL (ref 0.00–0.07)
Basophils Absolute: 0.1 10*3/uL (ref 0.0–0.1)
Basophils Relative: 1 %
Eosinophils Absolute: 0.5 10*3/uL (ref 0.0–0.5)
Eosinophils Relative: 5 %
HCT: 34 % — ABNORMAL LOW (ref 36.0–46.0)
Hemoglobin: 11.5 g/dL — ABNORMAL LOW (ref 12.0–15.0)
Immature Granulocytes: 0 %
Lymphocytes Relative: 39 %
Lymphs Abs: 3.9 10*3/uL (ref 0.7–4.0)
MCH: 26.4 pg (ref 26.0–34.0)
MCHC: 33.8 g/dL (ref 30.0–36.0)
MCV: 78 fL — ABNORMAL LOW (ref 80.0–100.0)
Monocytes Absolute: 0.8 10*3/uL (ref 0.1–1.0)
Monocytes Relative: 8 %
Neutro Abs: 4.8 10*3/uL (ref 1.7–7.7)
Neutrophils Relative %: 47 %
Platelets: 290 10*3/uL (ref 150–400)
RBC: 4.36 MIL/uL (ref 3.87–5.11)
RDW: 13.6 % (ref 11.5–15.5)
WBC: 10 10*3/uL (ref 4.0–10.5)
nRBC: 0 % (ref 0.0–0.2)

## 2019-08-24 LAB — VITAMIN B12: Vitamin B-12: 481 pg/mL (ref 180–914)

## 2019-08-24 LAB — VITAMIN D 25 HYDROXY (VIT D DEFICIENCY, FRACTURES): Vit D, 25-Hydroxy: 40.74 ng/mL (ref 30–100)

## 2019-08-25 ENCOUNTER — Telehealth: Payer: Self-pay

## 2019-08-25 ENCOUNTER — Other Ambulatory Visit: Payer: Self-pay | Admitting: Hematology and Oncology

## 2019-08-25 DIAGNOSIS — D508 Other iron deficiency anemias: Secondary | ICD-10-CM

## 2019-08-25 LAB — IRON AND TIBC
Iron: 40 ug/dL — ABNORMAL LOW (ref 41–142)
Saturation Ratios: 13 % — ABNORMAL LOW (ref 21–57)
TIBC: 304 ug/dL (ref 236–444)
UIBC: 264 ug/dL (ref 120–384)

## 2019-08-25 LAB — FERRITIN: Ferritin: 66 ng/mL (ref 11–307)

## 2019-08-25 NOTE — Telephone Encounter (Signed)
-----   Message from Heath Lark, MD sent at 08/25/2019  9:35 AM EST ----- Regarding: labs All her labs are good except still a bit anemic and iron is a bit low The holiday infusion room is fully booked. I advise we recheck labs again in 2 months and consider treatment then If she insist she is symptomatic, Then I can get scheduler to find her a spot somewhere to get treatment but it may be January Let me know what she decides

## 2019-08-25 NOTE — Telephone Encounter (Signed)
I sent scheduling msg to see her in 2 months with IV iron

## 2019-08-25 NOTE — Telephone Encounter (Signed)
Called and given below message. She verbalized understanding. She occasionally feels tired. She is agreeable to recheck labs/ treatment in 2 months. She will call back if she starts having more symptoms.

## 2019-08-27 ENCOUNTER — Telehealth: Payer: Self-pay | Admitting: Hematology and Oncology

## 2019-08-27 NOTE — Telephone Encounter (Signed)
Scheduled appt per 12/15 sch message - pt aware of appt date and time   

## 2019-09-18 ENCOUNTER — Telehealth: Payer: Self-pay | Admitting: Hematology and Oncology

## 2019-09-18 NOTE — Telephone Encounter (Signed)
Scheduled per lab. Called and spoke with pt, confirmed 2/11 appt

## 2019-10-22 ENCOUNTER — Inpatient Hospital Stay: Payer: 59 | Attending: Hematology and Oncology

## 2019-10-22 ENCOUNTER — Other Ambulatory Visit: Payer: Self-pay

## 2019-10-22 DIAGNOSIS — Z79899 Other long term (current) drug therapy: Secondary | ICD-10-CM | POA: Diagnosis not present

## 2019-10-22 DIAGNOSIS — Z9884 Bariatric surgery status: Secondary | ICD-10-CM | POA: Insufficient documentation

## 2019-10-22 DIAGNOSIS — D509 Iron deficiency anemia, unspecified: Secondary | ICD-10-CM | POA: Insufficient documentation

## 2019-10-22 DIAGNOSIS — K909 Intestinal malabsorption, unspecified: Secondary | ICD-10-CM | POA: Diagnosis not present

## 2019-10-22 DIAGNOSIS — Z791 Long term (current) use of non-steroidal anti-inflammatories (NSAID): Secondary | ICD-10-CM | POA: Insufficient documentation

## 2019-10-22 DIAGNOSIS — N920 Excessive and frequent menstruation with regular cycle: Secondary | ICD-10-CM | POA: Diagnosis not present

## 2019-10-22 DIAGNOSIS — D508 Other iron deficiency anemias: Secondary | ICD-10-CM

## 2019-10-22 LAB — CBC WITH DIFFERENTIAL/PLATELET
Abs Immature Granulocytes: 0.03 10*3/uL (ref 0.00–0.07)
Basophils Absolute: 0.1 10*3/uL (ref 0.0–0.1)
Basophils Relative: 1 %
Eosinophils Absolute: 0.6 10*3/uL — ABNORMAL HIGH (ref 0.0–0.5)
Eosinophils Relative: 5 %
HCT: 33.5 % — ABNORMAL LOW (ref 36.0–46.0)
Hemoglobin: 11.3 g/dL — ABNORMAL LOW (ref 12.0–15.0)
Immature Granulocytes: 0 %
Lymphocytes Relative: 36 %
Lymphs Abs: 4 10*3/uL (ref 0.7–4.0)
MCH: 25.1 pg — ABNORMAL LOW (ref 26.0–34.0)
MCHC: 33.7 g/dL (ref 30.0–36.0)
MCV: 74.4 fL — ABNORMAL LOW (ref 80.0–100.0)
Monocytes Absolute: 0.9 10*3/uL (ref 0.1–1.0)
Monocytes Relative: 8 %
Neutro Abs: 5.6 10*3/uL (ref 1.7–7.7)
Neutrophils Relative %: 50 %
Platelets: 271 10*3/uL (ref 150–400)
RBC: 4.5 MIL/uL (ref 3.87–5.11)
RDW: 14.4 % (ref 11.5–15.5)
WBC: 11.2 10*3/uL — ABNORMAL HIGH (ref 4.0–10.5)
nRBC: 0 % (ref 0.0–0.2)

## 2019-10-23 ENCOUNTER — Telehealth: Payer: Self-pay

## 2019-10-23 ENCOUNTER — Other Ambulatory Visit: Payer: Self-pay | Admitting: Hematology and Oncology

## 2019-10-23 ENCOUNTER — Other Ambulatory Visit: Payer: 59

## 2019-10-23 LAB — IRON AND TIBC
Iron: 21 ug/dL — ABNORMAL LOW (ref 41–142)
Saturation Ratios: 6 % — ABNORMAL LOW (ref 21–57)
TIBC: 348 ug/dL (ref 236–444)
UIBC: 326 ug/dL (ref 120–384)

## 2019-10-23 LAB — FERRITIN: Ferritin: 21 ng/mL (ref 11–307)

## 2019-10-23 NOTE — Telephone Encounter (Signed)
-----   Message from Artis Delay, MD sent at 10/23/2019 10:34 AM EST ----- Regarding: pls remind her to keep her appt next week, she will need IV iron

## 2019-10-23 NOTE — Telephone Encounter (Signed)
She called and given below message. She verbalized understanding. °

## 2019-10-29 ENCOUNTER — Telehealth: Payer: Self-pay | Admitting: *Deleted

## 2019-10-29 NOTE — Telephone Encounter (Signed)
Telephone call and LM for patient to return call. Due to weather and delayed opening appointment has been cancelled. Please return call to reschedule.

## 2019-10-30 ENCOUNTER — Inpatient Hospital Stay: Payer: 59 | Admitting: Hematology and Oncology

## 2019-10-30 ENCOUNTER — Inpatient Hospital Stay: Payer: 59

## 2019-11-05 ENCOUNTER — Inpatient Hospital Stay (HOSPITAL_BASED_OUTPATIENT_CLINIC_OR_DEPARTMENT_OTHER): Payer: 59 | Admitting: Hematology and Oncology

## 2019-11-05 ENCOUNTER — Inpatient Hospital Stay: Payer: 59

## 2019-11-05 ENCOUNTER — Telehealth: Payer: Self-pay | Admitting: Hematology and Oncology

## 2019-11-05 ENCOUNTER — Other Ambulatory Visit: Payer: Self-pay

## 2019-11-05 VITALS — BP 120/56 | HR 85 | Temp 98.0°F | Resp 18

## 2019-11-05 DIAGNOSIS — D509 Iron deficiency anemia, unspecified: Secondary | ICD-10-CM | POA: Diagnosis not present

## 2019-11-05 DIAGNOSIS — D508 Other iron deficiency anemias: Secondary | ICD-10-CM

## 2019-11-05 DIAGNOSIS — Z791 Long term (current) use of non-steroidal anti-inflammatories (NSAID): Secondary | ICD-10-CM | POA: Diagnosis not present

## 2019-11-05 DIAGNOSIS — Z9884 Bariatric surgery status: Secondary | ICD-10-CM | POA: Diagnosis not present

## 2019-11-05 DIAGNOSIS — K909 Intestinal malabsorption, unspecified: Secondary | ICD-10-CM | POA: Diagnosis not present

## 2019-11-05 DIAGNOSIS — N920 Excessive and frequent menstruation with regular cycle: Secondary | ICD-10-CM | POA: Diagnosis not present

## 2019-11-05 DIAGNOSIS — Z79899 Other long term (current) drug therapy: Secondary | ICD-10-CM | POA: Diagnosis not present

## 2019-11-05 MED ORDER — SODIUM CHLORIDE 0.9 % IV SOLN
510.0000 mg | Freq: Once | INTRAVENOUS | Status: AC
  Administered 2019-11-05: 510 mg via INTRAVENOUS
  Filled 2019-11-05: qty 510

## 2019-11-05 MED ORDER — SODIUM CHLORIDE 0.9 % IV SOLN
Freq: Once | INTRAVENOUS | Status: AC
  Filled 2019-11-05: qty 250

## 2019-11-05 NOTE — Patient Instructions (Signed)

## 2019-11-05 NOTE — Telephone Encounter (Signed)
Scheduled 3/4 appt per 2/25 sch msg. Left appt details in voicemail.

## 2019-11-06 ENCOUNTER — Encounter: Payer: Self-pay | Admitting: Hematology and Oncology

## 2019-11-06 DIAGNOSIS — F4322 Adjustment disorder with anxiety: Secondary | ICD-10-CM | POA: Diagnosis not present

## 2019-11-06 DIAGNOSIS — R0989 Other specified symptoms and signs involving the circulatory and respiratory systems: Secondary | ICD-10-CM | POA: Diagnosis not present

## 2019-11-06 NOTE — Assessment & Plan Note (Signed)
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 Repeat iron studies came back low The most likely cause of her anemia is due to chronic blood loss/malabsorption syndrome. We discussed some of the risks, benefits, and alternatives of intravenous iron infusions. The patient is symptomatic from anemia and the iron level is critically low. She tolerated oral iron supplement poorly and desires to achieved higher levels of iron faster for adequate hematopoesis. Some of the side-effects to be expected including risks of infusion reactions, phlebitis, headaches, nausea and fatigue.  The patient is willing to proceed. Patient education material was dispensed.  Goal is to keep ferritin level greater than 50 and resolution of anemia  I recommend consideration to get treatment for menorrhagia I will schedule her to get another iron infusion next week She will return here in 3 months for repeat blood work and iron studies   

## 2019-11-06 NOTE — Assessment & Plan Note (Signed)
She has recurrent severe menorrhagia The patient is undecided whether she wants to have more children I recommend her to discuss with her gynecologist for further management of menorrhagia

## 2019-11-06 NOTE — Progress Notes (Signed)
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 Repeat iron studies came back low The most likely cause of her anemia is due to chronic blood loss/malabsorption syndrome. We discussed some of the risks, benefits, and alternatives of intravenous iron infusions. The patient is symptomatic from anemia and the iron level is critically low. She tolerated oral iron supplement poorly and desires to achieved higher levels of iron faster for adequate hematopoesis. Some of the side-effects to be expected including risks of infusion reactions, phlebitis, headaches, nausea and fatigue.  The patient is willing to proceed. Patient education material was dispensed.  Goal is to keep ferritin level greater than 50 and resolution of anemia  I recommend consideration to get treatment for menorrhagia I will schedule her to get another iron infusion next week She will return here in 3 months for repeat blood work and iron studies    Menorrhagia with regular cycle She has recurrent severe menorrhagia The patient is undecided whether she wants to have more children I recommend her to discuss with her gynecologist for further management of menorrhagia   No orders of the defined types were placed in this encounter.   The total time spent in the appointment was 15 minutes encounter with patients including review of chart and various tests results, discussions about plan of care and coordination of care plan   All questions were answered. The patient knows to call the clinic with any problems, questions or concerns. No barriers to learning was detected.    Heath Lark, MD 2/26/202111:03 AM  INTERVAL HISTORY: Karina [name] 39 y.o. female returns for further follow-up She continues to have  regular menorrhagia She has not seek help to treat this In the meantime, she is symptomatic from anemia She denies infusion reaction   SUMMARY OF HEMATOLOGIC HISTORY:  Karina [name] is here because of severe iron deficiency She has diagnosis of thalassemia and history of bariatric surgery causing multiple mineral deficiencies: specifically, B12, iron, thiamine and vitamin D Her baseline blood work in July 2015 was normal. She had no recent labs since February 2017. She was found to have severe iron deficiency anemia Recently, she desired to become pregnant. She has a daughter, born in 48. Soon after that, she has an early trimester miscarriage She was evaluated by GYN and is currently on fertility treatment She was found to have abnormal CBC from recent blood work. She is recommended Iv iron therapy prior to becoming pregnant She complained of recent chest pain on exertion, shortness of breath on minimal exertion, pre-syncopal episodes, fatigue and palpitations. She had not noticed any recent bleeding such as epistaxis, hematuria or hematochezia. She had history of menorhagia The patient denies over the counter NSAID ingestion. She is not on antiplatelets agents. Her last colonoscopy was normal on 02/26/13. She had EGD on 08/28/12 which showed gastritis She had no prior history or diagnosis of cancer. Her age appropriate screening programs are up-to-date. 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 She never donated blood or received blood transfusion The patient was prescribed oral iron supplements and she takes three daily but it caused severe constipation without success of improving her anemia She received 2 doses of intravenous iron in July 2017 with improvement of ferritin level She was lost  to follow-up.  In August 2020, she received further intravenous iron for recurrent iron deficiency  I have reviewed the past medical history, past surgical  history, social history and family history with the patient and they are unchanged from previous note.  ALLERGIES:  has No Known Allergies.  MEDICATIONS:  Current Outpatient Medications  Medication Sig Dispense Refill  . acetaminophen (TYLENOL) 500 MG tablet Take 500 mg by mouth every 6 (six) hours as needed for mild pain, moderate pain or headache.    . albuterol (PROVENTIL HFA;VENTOLIN HFA) 108 (90 BASE) MCG/ACT inhaler Inhale 1-2 puffs into the lungs every 6 (six) hours as needed for wheezing or shortness of breath.     [name] azelastine (ASTELIN) 0.1 % nasal spray Place 1 spray into both nostrils 2 (two) times daily. Use in each nostril as directed 30 mL 0  . benzonatate (TESSALON PERLES) 100 MG capsule Take 1-2 capsules (100-200 mg total) by mouth every 8 (eight) hours as needed for cough. 30 capsule 0  . Cholecalciferol (VITAMIN D3) 50000 units CAPS Take 1 tablet by mouth 2 (two) times a week.    . famotidine (PEPCID) 20 MG tablet Take 40 mg by mouth daily.     . fluticasone (FLONASE) 50 MCG/ACT nasal spray Place 2 sprays into both nostrils daily. 16 g 0  . hydrocortisone cream 1 % Apply 1 application topically 2 (two) times daily as needed for itching.    [name] ibuprofen (ADVIL,MOTRIN) 600 MG tablet 1 po pc every 6 hours for  3 days then prn-pain (Patient not taking: Reported on 01/23/2018) 30 tablet 2  . neomycin-polymyxin-hydrocortisone (CORTISPORIN) OTIC solution Place 4 drops into the left ear 4 (four) times daily. 10 mL 0  . oxyCODONE (ROXICODONE) 5 MG immediate release tablet 1  po every 6 hours as needed for post operative pain (Patient not taking: Reported on 01/23/2018) 12 tablet 0  . Prenatal Multivit-Min-Fe-FA (PRENATAL VITAMINS) 0.8 MG tablet Take 1 tablet by mouth daily.    [name] sulfamethoxazole-trimethoprim (BACTRIM DS,SEPTRA DS) 800-160 MG tablet Take 1 tablet by mouth 2 (two) times daily. 20 tablet 0  . triamcinolone cream (KENALOG) 0.1 % Apply 1 application topically 2 (two) times  daily. (Patient not taking: Reported on 05/19/2018) 30 g 0   No current facility-administered medications for this visit.     REVIEW OF SYSTEMS:   Constitutional: Denies fevers, chills or night sweats Eyes: Denies blurriness of vision Ears, nose, mouth, throat, and face: Denies mucositis or sore throat Respiratory: Denies cough, dyspnea or wheezes Cardiovascular: Denies palpitation, chest discomfort or lower extremity swelling Gastrointestinal:  Denies nausea, heartburn or change in bowel habits Skin: Denies abnormal skin rashes Lymphatics: Denies new lymphadenopathy or easy bruising Neurological:Denies numbness, tingling or new weaknesses Behavioral/Psych: Mood is stable, no new changes  All other systems were reviewed with the patient and are negative.  PHYSICAL EXAMINATION: ECOG PERFORMANCE STATUS: 1 - Symptomatic but completely ambulatory  Vitals:   11/05/19 1222  BP: 136/61  Pulse: 79  Resp: 18  Temp: 99.1 F (37.3 C)  SpO2: 97%   Filed Weights   11/05/19 1222  Weight: (!) 314 lb (142.4 kg)    GENERAL:alert, no distress and comfortable NEURO: alert & oriented x 3 with fluent speech, no focal motor/sensory deficits  LABORATORY DATA:  I have reviewed the data as listed     Component Value Date/Time   NA 137 08/09/2017 1500   K 4.4 08/09/2017 1500   CL 103 08/09/2017 1500  CO2 29 08/09/2017 1500   GLUCOSE 94 08/09/2017 1500   BUN 12 08/09/2017 1500   CREATININE 0.73 08/09/2017 1500   CALCIUM 8.6 (L) 08/09/2017 1500   PROT 7.9 08/21/2012 1010   ALBUMIN 3.2 (L) 08/21/2012 1010   AST 30 08/21/2012 1010   ALT 41 (H) 08/21/2012 1010   ALKPHOS 131 (H) 08/21/2012 1010   BILITOT 0.5 08/21/2012 1010   GFRNONAA >60 08/09/2017 1500   GFRAA >60 08/09/2017 1500    No results found for: SPEP, UPEP  Lab Results  Component Value Date   WBC 11.2 (H) 10/22/2019   NEUTROABS 5.6 10/22/2019   HGB 11.3 (L) 10/22/2019   HCT 33.5 (L) 10/22/2019   MCV 74.4 (L) 10/22/2019    PLT 271 10/22/2019      Chemistry      Component Value Date/Time   NA 137 08/09/2017 1500   K 4.4 08/09/2017 1500   CL 103 08/09/2017 1500   CO2 29 08/09/2017 1500   BUN 12 08/09/2017 1500   CREATININE 0.73 08/09/2017 1500      Component Value Date/Time   CALCIUM 8.6 (L) 08/09/2017 1500   ALKPHOS 131 (H) 08/21/2012 1010   AST 30 08/21/2012 1010   ALT 41 (H) 08/21/2012 1010   BILITOT 0.5 08/21/2012 1010

## 2019-11-10 ENCOUNTER — Ambulatory Visit: Payer: 59 | Admitting: Hematology and Oncology

## 2019-11-10 ENCOUNTER — Telehealth: Payer: 59 | Admitting: Physician Assistant

## 2019-11-10 ENCOUNTER — Ambulatory Visit: Payer: 59

## 2019-11-10 DIAGNOSIS — B80 Enterobiasis: Secondary | ICD-10-CM

## 2019-11-10 NOTE — Progress Notes (Signed)
Hi Karina [name],   I am sorry you are not feeling well.  You will need to reach out to your Primary Care Provider for assistance with this prescription.  Certain medications to treat this problem need to be accompanied with appropriate lab work/lab values.  I see you have a significant past medical history and I would feel more comfortable if your PCP handled this for you.  Thank you for your understanding.   Based on what you shared with me, I feel your condition warrants further evaluation and I recommend that you be seen for a face to face visit.  Please contact your primary care physician practice to be seen. Many offices offer virtual options to be seen via video if you are not comfortable going in person to a medical facility at this time.  If you do not have a PCP, [facility] offers a free physician referral service available at [phone]. Our trained staff has the experience, knowledge and resources to put you in touch with a physician who is right for you.   You also have the option of a video visit through [email or web address]  If you are having a true medical emergency please call 911.  NOTE: If you entered your credit card information for this eVisit, you will not be charged. You may see a "hold" on your card for the $35 but that hold will drop off and you will not have a charge processed.  Your e-visit answers were reviewed by a board certified advanced clinical practitioner to complete your personal care plan.  Thank you for using e-Visits.

## 2019-11-12 ENCOUNTER — Inpatient Hospital Stay: Payer: 59 | Attending: Hematology and Oncology

## 2019-11-12 ENCOUNTER — Other Ambulatory Visit: Payer: Self-pay

## 2019-11-12 VITALS — BP 122/70 | HR 65 | Temp 97.9°F | Resp 17

## 2019-11-12 DIAGNOSIS — Z79899 Other long term (current) drug therapy: Secondary | ICD-10-CM | POA: Insufficient documentation

## 2019-11-12 DIAGNOSIS — D509 Iron deficiency anemia, unspecified: Secondary | ICD-10-CM | POA: Insufficient documentation

## 2019-11-12 DIAGNOSIS — D508 Other iron deficiency anemias: Secondary | ICD-10-CM

## 2019-11-12 MED ORDER — SODIUM CHLORIDE 0.9 % IV SOLN
Freq: Once | INTRAVENOUS | Status: AC
  Filled 2019-11-12: qty 250

## 2019-11-12 MED ORDER — SODIUM CHLORIDE 0.9 % IV SOLN
510.0000 mg | Freq: Once | INTRAVENOUS | Status: AC
  Administered 2019-11-12: 510 mg via INTRAVENOUS
  Filled 2019-11-12: qty 510

## 2019-11-12 NOTE — Patient Instructions (Signed)

## 2019-12-15 DIAGNOSIS — H5213 Myopia, bilateral: Secondary | ICD-10-CM | POA: Diagnosis not present

## 2020-01-18 DIAGNOSIS — F4322 Adjustment disorder with anxiety: Secondary | ICD-10-CM | POA: Diagnosis not present

## 2020-01-19 DIAGNOSIS — O3680X0 Pregnancy with inconclusive fetal viability, not applicable or unspecified: Secondary | ICD-10-CM | POA: Diagnosis not present

## 2020-01-27 DIAGNOSIS — N925 Other specified irregular menstruation: Secondary | ICD-10-CM | POA: Diagnosis not present

## 2020-01-27 DIAGNOSIS — G629 Polyneuropathy, unspecified: Secondary | ICD-10-CM | POA: Insufficient documentation

## 2020-01-27 DIAGNOSIS — Z113 Encounter for screening for infections with a predominantly sexual mode of transmission: Secondary | ICD-10-CM | POA: Diagnosis not present

## 2020-01-27 DIAGNOSIS — Z8759 Personal history of other complications of pregnancy, childbirth and the puerperium: Secondary | ICD-10-CM | POA: Diagnosis not present

## 2020-01-27 DIAGNOSIS — K219 Gastro-esophageal reflux disease without esophagitis: Secondary | ICD-10-CM | POA: Insufficient documentation

## 2020-02-03 DIAGNOSIS — O209 Hemorrhage in early pregnancy, unspecified: Secondary | ICD-10-CM | POA: Diagnosis not present

## 2020-02-04 ENCOUNTER — Ambulatory Visit
Admission: RE | Admit: 2020-02-04 | Discharge: 2020-02-04 | Disposition: A | Discharge status: Home / Self Care | Payer: 59 | Source: Ambulatory Visit | Attending: Obstetrics and Gynecology | Admitting: Obstetrics and Gynecology

## 2020-02-04 ENCOUNTER — Other Ambulatory Visit: Payer: Self-pay

## 2020-02-04 ENCOUNTER — Other Ambulatory Visit: Payer: Self-pay | Admitting: Obstetrics and Gynecology

## 2020-02-04 DIAGNOSIS — O209 Hemorrhage in early pregnancy, unspecified: Secondary | ICD-10-CM

## 2020-02-04 DIAGNOSIS — Z3A01 Less than 8 weeks gestation of pregnancy: Secondary | ICD-10-CM | POA: Diagnosis not present

## 2020-02-04 DIAGNOSIS — A209 Plague, unspecified: Secondary | ICD-10-CM | POA: Diagnosis not present

## 2020-02-04 DIAGNOSIS — O208 Other hemorrhage in early pregnancy: Secondary | ICD-10-CM | POA: Diagnosis not present

## 2020-02-24 ENCOUNTER — Inpatient Hospital Stay: Payer: 59

## 2020-02-25 DIAGNOSIS — Z3481 Encounter for supervision of other normal pregnancy, first trimester: Secondary | ICD-10-CM | POA: Diagnosis not present

## 2020-02-25 DIAGNOSIS — O09521 Supervision of elderly multigravida, first trimester: Secondary | ICD-10-CM | POA: Diagnosis not present

## 2020-02-25 DIAGNOSIS — O219 Vomiting of pregnancy, unspecified: Secondary | ICD-10-CM | POA: Diagnosis not present

## 2020-02-25 DIAGNOSIS — K219 Gastro-esophageal reflux disease without esophagitis: Secondary | ICD-10-CM | POA: Diagnosis not present

## 2020-02-25 DIAGNOSIS — Z362 Encounter for other antenatal screening follow-up: Secondary | ICD-10-CM | POA: Diagnosis not present

## 2020-02-25 DIAGNOSIS — R748 Abnormal levels of other serum enzymes: Secondary | ICD-10-CM | POA: Diagnosis not present

## 2020-02-25 DIAGNOSIS — Z3A10 10 weeks gestation of pregnancy: Secondary | ICD-10-CM | POA: Diagnosis not present

## 2020-03-03 ENCOUNTER — Telehealth: Payer: Self-pay | Admitting: Hematology and Oncology

## 2020-03-03 ENCOUNTER — Inpatient Hospital Stay: Payer: 59 | Attending: Hematology and Oncology

## 2020-03-03 NOTE — Telephone Encounter (Signed)
Called pt per 6/24 sch message - no answer. Left message for patient to call back to reschedule appt.

## 2020-03-10 DIAGNOSIS — M79662 Pain in left lower leg: Secondary | ICD-10-CM | POA: Diagnosis not present

## 2020-03-10 DIAGNOSIS — Z0000 Encounter for general adult medical examination without abnormal findings: Secondary | ICD-10-CM | POA: Diagnosis not present

## 2020-03-10 DIAGNOSIS — Z3A12 12 weeks gestation of pregnancy: Secondary | ICD-10-CM | POA: Diagnosis not present

## 2020-03-15 ENCOUNTER — Other Ambulatory Visit: Payer: Self-pay

## 2020-03-15 ENCOUNTER — Ambulatory Visit: Payer: Self-pay

## 2020-03-15 ENCOUNTER — Ambulatory Visit: Payer: 59 | Attending: Obstetrics and Gynecology | Admitting: Genetic Counselor

## 2020-03-15 ENCOUNTER — Other Ambulatory Visit: Payer: Self-pay | Admitting: *Deleted

## 2020-03-15 DIAGNOSIS — Z315 Encounter for genetic counseling: Secondary | ICD-10-CM | POA: Diagnosis not present

## 2020-03-15 DIAGNOSIS — D582 Other hemoglobinopathies: Secondary | ICD-10-CM | POA: Diagnosis not present

## 2020-03-15 DIAGNOSIS — Z3A11 11 weeks gestation of pregnancy: Secondary | ICD-10-CM | POA: Diagnosis not present

## 2020-03-15 DIAGNOSIS — O285 Abnormal chromosomal and genetic finding on antenatal screening of mother: Secondary | ICD-10-CM

## 2020-03-15 DIAGNOSIS — O99011 Anemia complicating pregnancy, first trimester: Secondary | ICD-10-CM | POA: Diagnosis not present

## 2020-03-15 NOTE — Progress Notes (Signed)
03/15/2020  [name] August 19, 1981 MRN: [id number] DOV: 03/15/2020  [name] presented to the [facility] for a genetics consultation regarding noninvasive prenatal screening (NIPS) results that were high risk for monosomy X. [name] presented to her appointment alone.   Indication for genetic counseling - NIPS high risk formonosomy X  Prenatal history  [name] is a [id number], 39 y.o. female. Her current pregnancy has completed [redacted]w[redacted]d (Estimated Date of Delivery: 09/17/20). [name] and her husband have a 60 year old daughter and a two year old daughter. [name] has also had two miscarriages.  [name] denied exposure to environmental toxins or chemical agents. She denied the use of alcohol, tobacco or street drugs. She reported taking prenatal vitamins, vitamin D, Zantac, and Tums. She denied significant viral illnesses and fevers during the course of her pregnancy. She had bleeding due to a small subchorionic hemorrhage around 8 weeks' gestation. [name] reported a personal history of asthma, anemia, and fatty liver. She has had laparoscopic gastric banding, laparoscopic right salpingectomy, and cholecystectomy. Her medical and surgical histories were otherwise noncontributory.  Family History  A three generation pedigree was drafted and reviewed. The family history is remarkable for the following:  - [name]'s sister has a history of three miscarriages; two early in the first trimester and one around 20 weeks' gestation. We reviewed that there are many potential causes of recurrent miscarriages, including anatomic, immunologic, endocrine, and genetic factors. However, a cause is only identified in 50% of individuals who experience recurrent miscarriages. Abnormalities of chromosome number or structure are the most common cause of sporadic early pregnancy loss. A significant proportion of  recurrent miscarriages may also be associated with structural or numerical chromosome abnormalities. We discussed that 2-5% of couples who experience multiple miscarriages carry a balanced translocation that can become unbalanced and potentially lethal in their offspring. In addition to chromosomal abnormalities, certain single gene, X-linked, and polygenic/multifactorial disorders are associated with recurrent miscarriage. [name] was informed if she were interested, her sister could have an evaluation with a prenatal genetic counselor.  - [name] has a family history of cancer. Her mother had uterine cancer, a maternal aunt had thyroid cancer diagnosed in her 36s, and another maternal aunt had vaginal cancer diagnosed at 57-48 years of age. Though most cancers are thought to be sporadic or due to environmental factors, some families appear to have a strong predisposition to cancers. When considering a family history of cancer, we look for common types of cancer in multiple family members occurring at younger than typical ages. We discussed the option of meeting with a cancer genetic counselor to discuss any possible screening or testing options available. If [name] is concerned about the family history of cancer and would like to learn more about the family's chance for an inherited cancer syndrome, she or her healthcare provider may refer her to the [facility] [phone]).   - [name] has a maternal uncle with hemachromatosis. No one else in the family has demonstrated any symptoms of the condition. Hemochromatosis is a condition that causes the body to absorb too much iron from the diet. Excess iron is stored in the body's tissues and organs, causing fatigue, joint pain, abdominal pain, and weight loss. Some individuals may develop arthritis, liver cirrhosis, or liver  cancer among other problems. Hemochromatosis demonstrates incomplete penetrance, meaning  not everyone who has the condition will display symptoms. Hemachromatosis is most often inherited in an autosomal recessive pattern. [name]'s mother has a 25% chance of being affected but asymptomatic or a 50% chance of being a carrier for the condition. This means that there is an increased chance that [name] herself is a carrier for [employer] or may develop the condition herself later in life. Carrier screening for hemachromatosis is available should the couple be interested in pursuing this.  The remaining family histories were reviewed and found to be noncontributory for birth defects, intellectual disability, recurrent pregnancy loss, and known genetic conditions.    The patient's ethnicity is [place]. The father of the pregnancy's ethnicity is [place]. Ashkenazi Jewish ancestry and consanguinity were denied. Pedigree will be scanned under Media.  Discussion  NIPS result:  Ms.[name] referred for genetic counseling as results from noninvasive prenatal screening (NIPS)came back positive for an increased risk ofmonosomy X, aka Turner syndrome,in the current pregnancy.We discussed that fetuses with Turner syndrome may present prenatally with increased nuchal translucency, cystic hygroma, hydrops,cardiacdefects, and renal anomalies on ultrasound. Additionally, there is up to a 99% risk for miscarriage in pregnancies affected with Turner syndrome. The risk of miscarriage is highest in the first trimester. Females born with Turner syndrome typically have short stature and early ovarian failure resulting in infertility.Affected individuals mayalso present with webbed necks, broad chests,cardiacdefects, and renalabnormalities. Affected females typically have normal intelligence, but may have developmental delays, nonverbal learning disabilities, and behavioral problems. Symptoms vary widely among affected individuals.  Monosomy Xtypically results from  theloss of oneX chromosome in affected females. Thismost oftenoccursdue to arandom error in chromosomal divisionduring the formation of reproductive cells in a process called nondisjunction.Rarely, Turner syndrome may be caused by a partial deletion of an X chromosome, which can beinherited from a parent. Since Turner syndrome is most often not inherited, recurrence is rare; however, cases of recurrence have been reported. The risk for Turner syndrome does not increase with maternal age unlike some other chromosomal aneuploidies.  We reviewed that NIPS analyzes cell free DNA originating from the placenta that is found in the maternal blood circulation during pregnancy. This test can provide information regarding the presence or absence of extra fetal DNA for chromosomes 13, 18 and 21as well as the sex chromosomes. The reported detection rate is greater than 99% for trisomy 21, greater than 99% for trisomy 18, greater than 91% for trisomy 13, and greater than 94% for monosomy X.However, it cannot be considered diagnostic for chromosome conditions. Positive predictive value (PPV) is the probability that a pregnancy with a positive test result is truly affected. The PPV reported by the NIPS laboratory for monosomy Gray Bernhardt the current pregnancywas estimated to be50%. The PPV calculator offered by the [employer] and the [employer] estimated PPV for monosomy Gray Bernhardt the current pregnancyto be42%.Thus, there is a 42-50% chance that this could be a true positive result.  Ms.[name] counseled that there are several possible explanations for her high risk NIPS result. Firstly, the fetus could truly be affected byTurner syndrome. Secondly, the fetus could be mosaic forTurner syndrome. Mosaicism occurs when an individual has two or more genetically different sets of cells in their body.Up to50% of cases of Turner syndrome are mosaic.Individuals who  are mosaic for Turner syndromehave some cells in the body withmonosomy Xand may have other cells that are  chromosomally normalwith two X chromosomes. We discussed that it would not be possible to tell which features an individual with mosaicTurner syndromemay have, as it would be impossible to assess which specific cells and tissues in the body have Turner syndrome. However, mosaic Turner syndrome has been associated with better prognosis and mosaic fetuses generally have fewer ultrasound findings.Thirdly, the placenta could haveTurner syndromewhile the fetus could be unaffected. This is a phenomenon known as confined placental mosaicism (CPM).Fourthly, Ms. Gunnar Fusi Dr Sondra Come may have an X chromosome abnormality herself that could explain this result. Somatic loss of a single X chromosome is a natural phenomenon that can occur as a woman ages. Additionally, women may be mosaic for Turner syndrome and not know it, as women with mosaicism can still be fertile and may have few features of the condition.Lastly, this could be a false positive result.   Testing options:  We discussed available testing options that can help to determine the etiology of these NIPS findings. Firstly, [name] may opt to continue routine ultrasounds. She was counseled that ~70% of fetuses with Turner syndrome will have at least one finding on ultrasound. However, this means that 30% of fetuses with Turner syndrome may not demonstrate any signs of the condition on ultrasound. We discussed that 30-50% of individuals with Turner syndrome have a cardiac defect, and that these defects are often possible to identify on ultrasound during pregnancy. [name] [facility] a few weeks for an ultrasound to assess for anomalies such as heart defects. If there is suspicion for any sort of abnormality, it would be recommended that [name] undergo a fetal echocardiogram for an  even deeper assessment of fetal cardiac defects.  Secondly, [name] was informed about the option of diagnostic testing viachorionic villus sampling (CVS) oramniocentesis. We discussed the technical aspects of each procedure and quoted up to a 1 in 500 (0.2%) risk for spontaneous pregnancy loss or other adverse pregnancy outcomes as a result of either procedure. We also reviewed that there is a risk for confined placental mosaicism associated with Turner syndrome on CVS. For this reason, amniocentesis is often recommended for cases of monosomy X identified on NIPS. However, CVS remains an available diagnostic testing option that can be offered earlier on in pregnancy, with the caveat that an amniocentesis would be recommended if CPM is suspected based on CVS results. Cultured cells from either a placental or amniotic fluid sample allow for the visualization of a fetal karyotype, which can detect >99% of chromosomal aberrationsincluding monosomy X. Chromosomal microarray can also be performed to identify smallerdeletions or duplications of fetal chromosomal material.Ms.Paula De Cruzwas informed that diagnostic testing would be the only way to definitivelydetermine if the fetus has Turner syndromeprenatally and aid in determining precise recurrence risks for future pregnancies. Diagnostic testing could also aid in determining the level of mosaicism that is present if the fetus is mosaic for Turner syndrome.   [name] was also made aware that she has the option of continuing with standard ultrasounds and pursuing genetic testing postnatally, with the caveat that there is a high likelihood of fetal demise if the fetus truly has Turner syndrome. Testing on products of conception following a fetal loss is also possible. Finally, we could consider ordering a maternal karyotype to assess [name] for X chromosome abnormalities or mosaicism for Turner syndrome.   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 Hemoglobin C is a variant form of hemoglobin caused by a specific mutation in the HBB gene. The HBB gene encodes for a protein called beta-globin, which is a subunit of hemoglobin. Hemoglobin within red blood cells binds to oxygen molecules in the lungs and delivers oxygen to tissues throughout the body. There are many different types of hemoglobin, with hemoglobin A being the most common form. Mutations in the HBB gene cause variant forms of hemoglobin to be produced, such as hemoglobin C and hemoglobin S. Individuals with hemoglobin C trait are carriers for hemoglobin C disease (HbC disease), whereas individuals with hemoglobin S trait are carriers for sickle cell disease. Since Ms. 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   If Ms. Langley AdiePaula De Cruz's partner carries sickle cell trait or hemoglobin C trait, the couple would have a 1 in 4 (25%) chance of having a child with hemoglobin C or hemoglobin [place] disease. If he is a carrier for beta-thalassemia due to a different variant in the HBB gene, the couple would have a 1 in 4 (25%) chance of having a child with HbC/beta-thalassemia. Ms. Karina Meresaula De Cruz was intimately familiar with her risks and with each of these conditions. She informed me that her partner has already undergone testing which was negative. However, a copy of his testing report was not available for my review today. Ms. Karina Meresaula De Cruz informed me that her OBGYN provider likely has a copy of her partner's testing results. I will request a copy of these results to perform a more accurate risk assessment.  Plan:  Ms. Karina Meresaula De Cruz requested to speak with her husband before making any further testing decisions to determine the etiology of her NIPS result. She is likely not interested in pursuing diagnostic testing, but is aware that it is available at any time. We also  discussed that at 24 weeks' gestation, the risk for miscarriage becomes a risk for preterm labor, as this is considered the point of viability in pregnancy. Some women prefer to wait until later in their pregnancy to pursue diagnostic testing because of this. I provided Ms. Karina MeresPaula De Cruz with my contact information and encouraged her to contact me if she has any questions or desires further testing at any time.  I counseled Ms. Karina MeresPaula De Cruz regarding the above risks and available options. Second year [employer] genetic counseling student Norlene Duelna Forsythe participated in portions of this session under my supervision. The approximate face-to-face time with the genetic counselor was 60 minutes.  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 I will request a copy of his testing report  Offered additional testing and screening  Can pursue maternal karyotype for sex chromosome abnormalities/mosaic Turner syndrome  Reviewed family history concerns   Gershon CraneHaley E Jaylani Mcguinn, MS, Aeronautical engineerCGC Genetic Counselor

## 2020-03-21 DIAGNOSIS — Z3A14 14 weeks gestation of pregnancy: Secondary | ICD-10-CM | POA: Diagnosis not present

## 2020-03-21 DIAGNOSIS — F419 Anxiety disorder, unspecified: Secondary | ICD-10-CM | POA: Diagnosis not present

## 2020-03-21 DIAGNOSIS — O3680X9 Pregnancy with inconclusive fetal viability, other fetus: Secondary | ICD-10-CM | POA: Diagnosis not present

## 2020-03-21 DIAGNOSIS — O021 Missed abortion: Secondary | ICD-10-CM | POA: Diagnosis not present

## 2020-03-22 DIAGNOSIS — K760 Fatty (change of) liver, not elsewhere classified: Secondary | ICD-10-CM | POA: Diagnosis present

## 2020-03-22 DIAGNOSIS — Z9079 Acquired absence of other genital organ(s): Secondary | ICD-10-CM

## 2020-03-22 DIAGNOSIS — F32A Depression, unspecified: Secondary | ICD-10-CM | POA: Diagnosis present

## 2020-03-22 DIAGNOSIS — O039 Complete or unspecified spontaneous abortion without complication: Secondary | ICD-10-CM | POA: Diagnosis present

## 2020-03-22 DIAGNOSIS — O09529 Supervision of elderly multigravida, unspecified trimester: Secondary | ICD-10-CM

## 2020-03-22 NOTE — H&P (Addendum)
Karina [name] is a 39 y.o. female, [phone] with missed AB, presenting for scheduled D&E.  Seen in office on 7/12 for regular return OB visit at 14 weeks, with inability to auscultate FHT.  Fetal demise measuring at 12 2/7 weeks noted.  Patient was without hx of bleeding or cramping.  Options for management were reviewed, and Dr. Alesia Richards was consulted.  D&E was offered and patient desired to proceed with this on 03/24/20.  Pregnancy remarkable for elevated risk Monosomy X on Panorama (1:2 risk), female infant.  Patient met with genetic counselor to discuss this result, and patient was considering amnio. She had impending appt with MFM scheduled for 03/23/20 for [place] and MFM MD consult, and she was going to discuss amnio at that time.  She has hx of anxiety/depression and was recently restarted on Lexapro 5 mg daily and Xanax 0.5 mg po TID prn after dx of SAB.  She is interested in genetic testing of the POC, in light of possible fetal Monosomy X.  Patient Active Problem List   Diagnosis Date Noted  . Miscarriage--x 3, 2016, 2020, 2021 03/22/2020  . Fatty liver--followed by Dr. Collene Mares 03/22/2020  . AMA (advanced maternal age) multigravida 35+ 03/22/2020  . History of unilateral salpingectomy--right 03/22/2020  . Depression 03/22/2020  . Cardiac murmur 04/24/2017  . PCOS (polycystic ovarian syndrome) 10/26/2016  . History of insulin resistance 10/26/2016  . Sleep apnea 10/26/2016  . Obesity 07/20/2016  . Iron deficiency anemia 03/23/2016  . Vitamin D deficiency 03/23/2016  . 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 Asthma 11/09/2013  . Anxiety 02/20/2013  . H/O gastrointestinal disease 02/20/2013  . H/O laparoscopic adjustable gastric banding 02/20/2013  . Biliary dyskinesia 12/06/2011  . Palpitation 04/03/2011    History of present pregnancy: Patient entered care at 7 4/7 weeks.   EDC of 09/17/20 by LMP,c/w [place] at 6 1/7 weeks. LFTs were checked with NOB labs, due to  hx fatty liver dx, with AST 40, ALT slightly elevated at 55, much improved from 2020 levels.  She has been asymptomatic of the fatty liver.   Panorama genetic testing showed high risk Monosomy X, and female fetus.  Patient was referred to genetic counselors with [facility].  She was considering amnio, and was to discuss this at her MFM consult appt scheduled 7/15.  She had ROB visit on 7/12, where the missed miscarriage was dx.  VS were stable that day, with patient weight 321.  OB History    Gravida  3   Para  2   Term  2   Preterm      AB  1   Living  1     SAB  1   TAB      Ectopic      Multiple  0   Live Births  2         2015--SVB at 33 2/7 weeks, female, 6+8, WHG with [facility] 2016--SAB at 6 weeks 2018--SVB at 4 weeks, female, 72+8, WHG with [facility] 11/09/18--SAB 8 weeks, resolved spontaneously.  Past Medical History:  Diagnosis Date  . Anemia    takes iron daily  . Anxiety    no meds  . Asthma    albuterol as needed  . Chronic bronchitis    bronchitis x 1 event in her twenties  . Edema extremities    hx of edema in lower ext.used to take lasix - none  since twenties  . Gallbladder disease   . GERD (gastroesophageal reflux disease)    nexium   . Hemoglobin C trait ([place])   . History of chicken pox   . HSV (herpes simplex virus) anogenital infection   . Neuropathy    feet  . Pre-diabetes   . Pregnant    svd 06/19/17  . Sleep apnea    Does not use cpap nightly  . SVD (spontaneous vaginal delivery)    x 2   Past Surgical History:  Procedure Laterality Date  . CARPAL TUNNEL RELEASE Bilateral 2008, 2010  . CHOLECYSTECTOMY  01/11/2012   Procedure: LAPAROSCOPIC CHOLECYSTECTOMY WITH INTRAOPERATIVE CHOLANGIOGRAM;  Surgeon: Gwenyth Ober, MD;  Location: [facility];  Service: General;  Laterality: N/A;  . COLONOSCOPY    . LAPAROSCOPIC BILATERAL SALPINGECTOMY Right 08/14/2017   Procedure: LAPAROSCOPIC RIGHT  SALPINGECTOMY;  Surgeon:  Delsa Bern, MD;  Location: [facility];  Service: Gynecology;  Laterality: Right;  . LAPAROSCOPIC GASTRIC BANDING    . UPPER GI ENDOSCOPY     Family History: family history includes Anesthesia problems in her mother; Asthma in her maternal grandmother and sister; Cervical cancer in her maternal aunt; Diabetes in her sister; Heart disease in an other family member; Hyperlipidemia in an other family member; Hypertension in an other family member; Kidney disease in an other family member; Liver cancer in her paternal grandfather and paternal uncle; Ovarian cancer in her maternal aunt; Prostate cancer in her maternal uncle and paternal grandfather; Stroke in an other family member; Thyroid disease in an other family member.   Social History:  reports that she quit smoking about 24 years ago. Her smoking use included cigars and cigarettes. She has a 0.25 pack-year smoking history. She has never used smokeless tobacco. She reports current alcohol use. She reports that she does not use drugs. She is married, employed as a phelbotomist.    ROS:  Denies cramping, bleeding, SOB, chest pain or any other issues  No Known Allergies       Chest clear Heart RRR without murmur Abd gravid, NT, FH 12 weeks Pelvic: Deferred Ext: 1-2+ edema   Prenatal labs: ABO, Rh:  A+ Antibody:  Neg Rubella:   immune RPR:   NR HBsAg:   Neg HIV:   NR GBS:   Sickle cell/Hgb electrophoresis:  Hgb C Pap:  2019, WNL GC:  Negative 02/04/20 Chlamydia: Negative 02/04/20 Genetic screenings: High risk Monosomy X (1:2) on Panorama, female Other:   Hgb 12.5 at NOB      Assessment/Plan: Missed AB at 12 2/7 week size RH positive blood type High risk Monosomy X on Panorama testing AMA Non-alcoholic fatty liver disease Hx anxiety/depression--on Lexapro daily and Xanax prn. Asthma  Plan: Admit to [facility] for outpatient D&E Routine [facility] pre-op orders Dr. Alesia Richards will discuss option of genetic testing of POC with  patient prior to procedure. Support to patient and partner for loss.  Donnel Saxon CNM, [place] 03/22/2020, 11:00 PM  I saw and examined patient at bedside and agree with above findings, assessment and plan as outlined above by CNM Donnel Saxon.   This procedure has been fully reviewed with the patient and written informed consent has been obtained.  CBC    Component Value Date/Time   WBC 11.2 (H) 10/22/2019 1538   RBC 4.50 10/22/2019 1538   HGB 11.3 (L) 10/22/2019 1538   HGB 10.3 (L) 03/23/2016 1211   HCT 33.5 (L) 10/22/2019 1538   HCT 31.0 (L) 03/23/2016  1211   PLT 271 10/22/2019 1538   PLT 263 03/23/2016 1211   MCV 74.4 (L) 10/22/2019 1538   MCV 65.7 (L) 03/23/2016 1211   MCH 25.1 (L) 10/22/2019 1538   MCHC 33.7 10/22/2019 1538   RDW 14.4 10/22/2019 1538   RDW 16.3 (H) 03/23/2016 1211   LYMPHSABS 4.0 10/22/2019 1538   LYMPHSABS 2.8 03/23/2016 1211   MONOABS 0.9 10/22/2019 1538   MONOABS 0.6 03/23/2016 1211   EOSABS 0.6 (H) 10/22/2019 1538   EOSABS 0.5 03/23/2016 1211   BASOSABS 0.1 10/22/2019 1538   BASOSABS 0.1 03/23/2016 1211      Recent Results (from the past 2160 hour(s))  SARS CORONAVIRUS 2 (TAT 6-24 HRS) Nasopharyngeal Nasopharyngeal Swab     Status: None   Collection Time: 03/23/20  9:26 AM   Specimen: Nasopharyngeal Swab  Result Value Ref Range   SARS Coronavirus 2 NEGATIVE NEGATIVE    Comment: (NOTE) SARS-CoV-2 target nucleic acids are NOT DETECTED.  The SARS-CoV-2 RNA is generally detectable in upper and lower respiratory specimens during the acute phase of infection. Negative results do not preclude SARS-CoV-2 infection, do not rule out co-infections with other pathogens, and should not be used as the sole basis for treatment or other patient management decisions. Negative results must be combined with clinical observations, patient history, and epidemiological information. The expected result is Negative.  Fact Sheet for  Patients: SugarRoll.be  Fact Sheet for Healthcare Providers: https://www.woods-mathews.com/  This test is not yet approved or cleared by the [place] FDA and  has been authorized for detection and/or diagnosis of SARS-CoV-2 by FDA under an Emergency Use Authorization (EUA). This EUA will remain  in effect (meaning this test can be used) for the duration of the COVID-19 declaration under Se ction 564(b)(1) of the Act, 21 U.S.C. section 360bbb-3(b)(1), unless the authorization is terminated or revoked sooner.  Performed at [facility], [place] [address] [place], [place],  [address]    A POS : Blood type  Dr. Alesia Richards. 03/23/2020.  2100.

## 2020-03-23 ENCOUNTER — Other Ambulatory Visit (HOSPITAL_COMMUNITY)
Admission: RE | Admit: 2020-03-23 | Discharge: 2020-03-23 | Disposition: A | Discharge status: Home / Self Care | Payer: 59 | Source: Ambulatory Visit | Attending: Obstetrics & Gynecology | Admitting: Obstetrics & Gynecology

## 2020-03-23 ENCOUNTER — Encounter: Payer: Self-pay | Admitting: Genetic Counselor

## 2020-03-23 ENCOUNTER — Encounter (HOSPITAL_COMMUNITY): Payer: Self-pay | Admitting: Obstetrics & Gynecology

## 2020-03-23 ENCOUNTER — Other Ambulatory Visit: Payer: Self-pay

## 2020-03-23 DIAGNOSIS — Z01812 Encounter for preprocedural laboratory examination: Secondary | ICD-10-CM | POA: Insufficient documentation

## 2020-03-23 DIAGNOSIS — Z20822 Contact with and (suspected) exposure to covid-19: Secondary | ICD-10-CM | POA: Insufficient documentation

## 2020-03-23 LAB — SARS CORONAVIRUS 2 (TAT 6-24 HRS): SARS Coronavirus 2: NEGATIVE

## 2020-03-23 NOTE — Progress Notes (Signed)
Pt denies SOB, chest pain, and being under the care of a cardiologist.Pt stated that PCP is Dr. Gardiner Fanti. Pt denies having a cardiac cath but stated that a stress test was performed 8 years ago. Pt denies having an EKG and chest x ray. Pt stated that she was told that she had an arrhythmia during an endoscopy procedure. Pt denies recent labs. Pt made aware to stop taking  Aspirin (unless otherwise advised by surgeon), vitamins, fish oil and herbal medications. Do not take any NSAIDs ie: Ibuprofen, Advil, Naproxen (Aleve), Motrin, BC and Goody Powder. Pt reminded to quarantine. Pt verbalized understanding of all pre-op instructions.

## 2020-03-24 ENCOUNTER — Encounter (HOSPITAL_COMMUNITY): Payer: Self-pay | Admitting: Obstetrics & Gynecology

## 2020-03-24 ENCOUNTER — Ambulatory Visit (HOSPITAL_COMMUNITY): Payer: 59 | Admitting: Certified Registered Nurse Anesthetist

## 2020-03-24 ENCOUNTER — Ambulatory Visit (HOSPITAL_COMMUNITY)
Admission: RE | Admit: 2020-03-24 | Discharge: 2020-03-24 | Disposition: A | Discharge status: Home / Self Care | Payer: 59 | Attending: Obstetrics & Gynecology | Admitting: Obstetrics & Gynecology

## 2020-03-24 ENCOUNTER — Encounter (HOSPITAL_COMMUNITY)
Admission: RE | Disposition: A | Discharge status: Home / Self Care | Payer: Self-pay | Source: Home / Self Care | Attending: Obstetrics & Gynecology

## 2020-03-24 ENCOUNTER — Other Ambulatory Visit: Payer: Self-pay

## 2020-03-24 DIAGNOSIS — E669 Obesity, unspecified: Secondary | ICD-10-CM | POA: Diagnosis present

## 2020-03-24 DIAGNOSIS — G4730 Sleep apnea, unspecified: Secondary | ICD-10-CM | POA: Diagnosis not present

## 2020-03-24 DIAGNOSIS — O021 Missed abortion: Secondary | ICD-10-CM | POA: Insufficient documentation

## 2020-03-24 DIAGNOSIS — D582 Other hemoglobinopathies: Secondary | ICD-10-CM | POA: Diagnosis present

## 2020-03-24 DIAGNOSIS — Z87891 Personal history of nicotine dependence: Secondary | ICD-10-CM | POA: Diagnosis not present

## 2020-03-24 DIAGNOSIS — Z9079 Acquired absence of other genital organ(s): Secondary | ICD-10-CM

## 2020-03-24 DIAGNOSIS — D649 Anemia, unspecified: Secondary | ICD-10-CM | POA: Insufficient documentation

## 2020-03-24 DIAGNOSIS — K760 Fatty (change of) liver, not elsewhere classified: Secondary | ICD-10-CM | POA: Diagnosis present

## 2020-03-24 DIAGNOSIS — J45909 Unspecified asthma, uncomplicated: Secondary | ICD-10-CM | POA: Diagnosis not present

## 2020-03-24 DIAGNOSIS — K219 Gastro-esophageal reflux disease without esophagitis: Secondary | ICD-10-CM | POA: Diagnosis not present

## 2020-03-24 DIAGNOSIS — F329 Major depressive disorder, single episode, unspecified: Secondary | ICD-10-CM | POA: Insufficient documentation

## 2020-03-24 DIAGNOSIS — O09529 Supervision of elderly multigravida, unspecified trimester: Secondary | ICD-10-CM

## 2020-03-24 DIAGNOSIS — F32A Depression, unspecified: Secondary | ICD-10-CM | POA: Diagnosis present

## 2020-03-24 DIAGNOSIS — F419 Anxiety disorder, unspecified: Secondary | ICD-10-CM | POA: Insufficient documentation

## 2020-03-24 DIAGNOSIS — O039 Complete or unspecified spontaneous abortion without complication: Secondary | ICD-10-CM | POA: Diagnosis present

## 2020-03-24 DIAGNOSIS — Z3A12 12 weeks gestation of pregnancy: Secondary | ICD-10-CM | POA: Diagnosis not present

## 2020-03-24 HISTORY — DX: Presence of spectacles and contact lenses: Z97.3

## 2020-03-24 HISTORY — DX: Other complications of anesthesia, initial encounter: T88.59XA

## 2020-03-24 HISTORY — DX: Cardiac arrhythmia, unspecified: I49.9

## 2020-03-24 HISTORY — PX: DILATION AND EVACUATION: SHX1459

## 2020-03-24 LAB — CBC
HCT: 37.6 % (ref 36.0–46.0)
Hemoglobin: 12.5 g/dL (ref 12.0–15.0)
MCH: 26.2 pg (ref 26.0–34.0)
MCHC: 33.2 g/dL (ref 30.0–36.0)
MCV: 78.7 fL — ABNORMAL LOW (ref 80.0–100.0)
Platelets: 235 K/uL (ref 150–400)
RBC: 4.78 MIL/uL (ref 3.87–5.11)
RDW: 13.5 % (ref 11.5–15.5)
WBC: 9.9 K/uL (ref 4.0–10.5)
nRBC: 0 % (ref 0.0–0.2)

## 2020-03-24 LAB — TYPE AND SCREEN
ABO/RH(D): A POS
Antibody Screen: NEGATIVE

## 2020-03-24 SURGERY — DILATION AND EVACUATION, UTERUS
Anesthesia: General | Site: Uterus

## 2020-03-24 MED ORDER — MEPERIDINE HCL 25 MG/ML IJ SOLN: 6.2500 mg | INTRAMUSCULAR | Status: DC | PRN

## 2020-03-24 MED ORDER — ORAL CARE MOUTH RINSE: 15.0000 mL | Freq: Once | OROMUCOSAL | Status: AC

## 2020-03-24 MED ORDER — ROCURONIUM BROMIDE 10 MG/ML (PF) SYRINGE
PREFILLED_SYRINGE | INTRAVENOUS | Status: DC | PRN
  Administered 2020-03-24: 50 mg via INTRAVENOUS

## 2020-03-24 MED ORDER — HYDROMORPHONE HCL 1 MG/ML IJ SOLN: 0.2500 mg | INTRAMUSCULAR | Status: DC | PRN

## 2020-03-24 MED ORDER — 0.9 % SODIUM CHLORIDE (POUR BTL) OPTIME
TOPICAL | Status: DC | PRN
  Administered 2020-03-24: 1000 mL

## 2020-03-24 MED ORDER — POVIDONE-IODINE 10 % EX SWAB: 2.0000 "application " | Freq: Once | CUTANEOUS | Status: DC

## 2020-03-24 MED ORDER — ONDANSETRON HCL 4 MG/2ML IJ SOLN
INTRAMUSCULAR | Status: DC | PRN
  Administered 2020-03-24: 4 mg via INTRAVENOUS

## 2020-03-24 MED ORDER — BUPIVACAINE HCL (PF) 0.5 % IJ SOLN
INTRAMUSCULAR | Status: AC
  Filled 2020-03-24: qty 30

## 2020-03-24 MED ORDER — SODIUM CHLORIDE 0.9 % IV SOLN
100.0000 mg | Freq: Once | INTRAVENOUS | Status: AC
  Administered 2020-03-24: 100 mg via INTRAVENOUS
  Filled 2020-03-24: qty 100

## 2020-03-24 MED ORDER — MIDAZOLAM HCL 5 MG/5ML IJ SOLN
INTRAMUSCULAR | Status: DC | PRN
  Administered 2020-03-24: 2 mg via INTRAVENOUS

## 2020-03-24 MED ORDER — FENTANYL CITRATE (PF) 250 MCG/5ML IJ SOLN
INTRAMUSCULAR | Status: DC | PRN
  Administered 2020-03-24: 50 ug via INTRAVENOUS
  Administered 2020-03-24: 25 ug via INTRAVENOUS
  Administered 2020-03-24: 50 ug via INTRAVENOUS
  Administered 2020-03-24: 25 ug via INTRAVENOUS

## 2020-03-24 MED ORDER — ONDANSETRON HCL 4 MG/2ML IJ SOLN: 4.0000 mg | Freq: Once | INTRAMUSCULAR | Status: DC | PRN

## 2020-03-24 MED ORDER — METHYLERGONOVINE MALEATE 0.2 MG/ML IJ SOLN
INTRAMUSCULAR | Status: DC | PRN
  Administered 2020-03-24: .2 mg via INTRAMUSCULAR

## 2020-03-24 MED ORDER — LIDOCAINE 2% (20 MG/ML) 5 ML SYRINGE
INTRAMUSCULAR | Status: AC
  Filled 2020-03-24: qty 5

## 2020-03-24 MED ORDER — FENTANYL CITRATE (PF) 250 MCG/5ML IJ SOLN
INTRAMUSCULAR | Status: AC
  Filled 2020-03-24: qty 5

## 2020-03-24 MED ORDER — LACTATED RINGERS IV SOLN: INTRAVENOUS | Status: DC

## 2020-03-24 MED ORDER — MIDAZOLAM HCL 2 MG/2ML IJ SOLN
INTRAMUSCULAR | Status: AC
  Filled 2020-03-24: qty 2

## 2020-03-24 MED ORDER — LIDOCAINE 2% (20 MG/ML) 5 ML SYRINGE
INTRAMUSCULAR | Status: DC | PRN
  Administered 2020-03-24: 40 mg via INTRAVENOUS

## 2020-03-24 MED ORDER — METHYLERGONOVINE MALEATE 0.2 MG PO TABS
0.2000 mg | ORAL_TABLET | Freq: Four times a day (QID) | ORAL | 0 refills | Status: AC
Start: 2020-03-24 — End: 2020-03-26

## 2020-03-24 MED ORDER — SUGAMMADEX SODIUM 200 MG/2ML IV SOLN
INTRAVENOUS | Status: DC | PRN
  Administered 2020-03-24: 300 mg via INTRAVENOUS

## 2020-03-24 MED ORDER — CHLORHEXIDINE GLUCONATE 0.12 % MT SOLN
15.0000 mL | Freq: Once | OROMUCOSAL | Status: AC
  Administered 2020-03-24: 15 mL via OROMUCOSAL
  Filled 2020-03-24: qty 15

## 2020-03-24 MED ORDER — PROPOFOL 10 MG/ML IV BOLUS
INTRAVENOUS | Status: AC
  Filled 2020-03-24: qty 20

## 2020-03-24 MED ORDER — PROPOFOL 10 MG/ML IV BOLUS
INTRAVENOUS | Status: DC | PRN
  Administered 2020-03-24: 180 mg via INTRAVENOUS

## 2020-03-24 MED ORDER — DEXAMETHASONE SODIUM PHOSPHATE 10 MG/ML IJ SOLN
INTRAMUSCULAR | Status: DC | PRN
  Administered 2020-03-24: 10 mg via INTRAVENOUS

## 2020-03-24 MED ORDER — BUPIVACAINE-EPINEPHRINE 0.5% -1:200000 IJ SOLN
INTRAMUSCULAR | Status: AC
  Filled 2020-03-24: qty 1

## 2020-03-24 MED ORDER — IBUPROFEN 800 MG PO TABS: 800.0000 mg | ORAL_TABLET | Freq: Three times a day (TID) | ORAL | 0 refills | Status: AC | PRN

## 2020-03-24 MED ORDER — ONDANSETRON HCL 4 MG/2ML IJ SOLN
INTRAMUSCULAR | Status: AC
  Filled 2020-03-24: qty 2

## 2020-03-24 MED ORDER — METHYLERGONOVINE MALEATE 0.2 MG/ML IJ SOLN
INTRAMUSCULAR | Status: AC
  Filled 2020-03-24: qty 1

## 2020-03-24 MED ORDER — BUPIVACAINE-EPINEPHRINE 0.5% -1:200000 IJ SOLN
INTRAMUSCULAR | Status: DC | PRN
  Administered 2020-03-24: 10 mL

## 2020-03-24 SURGICAL SUPPLY — 22 items
CATH ROBINSON RED A/P 16FR (CATHETERS) ×2 IMPLANT
DECANTER SPIKE VIAL GLASS SM (MISCELLANEOUS) IMPLANT
FILTER UTR ASPR ASSEMBLY (MISCELLANEOUS) ×2 IMPLANT
GLOVE BIO SURGEON STRL SZ 6.5 (GLOVE) ×2 IMPLANT
GLOVE BIOGEL PI IND STRL 7.0 (GLOVE) ×2 IMPLANT
GLOVE BIOGEL PI INDICATOR 7.0 (GLOVE) ×2
GOWN STRL REUS W/ TWL LRG LVL3 (GOWN DISPOSABLE) ×2 IMPLANT
GOWN STRL REUS W/TWL LRG LVL3 (GOWN DISPOSABLE) ×4
HOSE CONNECTING 18IN BERKELEY (TUBING) ×2 IMPLANT
KIT BERKELEY 1ST TRI 3/8 NO TR (MISCELLANEOUS) ×2 IMPLANT
KIT BERKELEY 1ST TRIMESTER 3/8 (MISCELLANEOUS) ×2 IMPLANT
NS IRRIG 1000ML POUR BTL (IV SOLUTION) ×2 IMPLANT
PACK VAGINAL MINOR WOMEN LF (CUSTOM PROCEDURE TRAY) ×2 IMPLANT
PAD OB MATERNITY 4.3X12.25 (PERSONAL CARE ITEMS) ×2 IMPLANT
SET BERKELEY SUCTION TUBING (SUCTIONS) ×2 IMPLANT
TOWEL GREEN STERILE FF (TOWEL DISPOSABLE) ×4 IMPLANT
UNDERPAD 30X36 HEAVY ABSORB (UNDERPADS AND DIAPERS) ×2 IMPLANT
VACURETTE 10 RIGID CVD (CANNULA) IMPLANT
VACURETTE 12 RIGID CVD (CANNULA) ×2 IMPLANT
VACURETTE 7MM CVD STRL WRAP (CANNULA) IMPLANT
VACURETTE 8 RIGID CVD (CANNULA) IMPLANT
VACURETTE 9 RIGID CVD (CANNULA) IMPLANT

## 2020-03-24 NOTE — Discharge Instructions (Signed)
Karina [name], 1. Nothing in vagina x 2 weeks.  2. Expect some vaginal bleeding for next several days, call me if with excessive bleeding requiring you to change a pad every hour.  3.  Expect some abdominal cramping, take pain medication as needed.  Call me if pain is intolerable despite medication use. 4. Please call office to make an appointment to see me in 2 weeks for post procedure check.  Dr. Sallye Ober.

## 2020-03-24 NOTE — Anesthesia Preprocedure Evaluation (Signed)
Anesthesia Evaluation  Patient identified by MRN, date of birth, ID band Patient awake    Reviewed: Allergy & Precautions, NPO status , Patient's Chart, lab work & pertinent test results  Airway Mallampati: I  TM Distance: >3 FB Neck ROM: Full    Dental   Pulmonary sleep apnea , former smoker,    Pulmonary exam normal        Cardiovascular Normal cardiovascular exam     Neuro/Psych Anxiety Depression    GI/Hepatic GERD  Medicated and Controlled,  Endo/Other    Renal/GU      Musculoskeletal   Abdominal   Peds  Hematology   Anesthesia Other Findings   Reproductive/Obstetrics                             Anesthesia Physical Anesthesia Plan  ASA: II  Anesthesia Plan: General   Post-op Pain Management:    Induction: Intravenous  PONV Risk Score and Plan: 3 and Ondansetron, Midazolam and Treatment may vary due to age or medical condition  Airway Management Planned: LMA  Additional Equipment:   Intra-op Plan:   Post-operative Plan: Extubation in OR  Informed Consent: I have reviewed the patients History and Physical, chart, labs and discussed the procedure including the risks, benefits and alternatives for the proposed anesthesia with the patient or authorized representative who has indicated his/her understanding and acceptance.       Plan Discussed with: CRNA and Surgeon  Anesthesia Plan Comments:         Anesthesia Quick Evaluation

## 2020-03-24 NOTE — Interval H&P Note (Signed)
History and Physical Interval Note:  03/24/2020 4:23 PM  Karina [name]  has presented today for surgery, with the diagnosis of miscarriage.  The various methods of treatment have been discussed with the patient and family. After consideration of risks, benefits and other options for treatment, the patient has consented to  Procedure(s): DILATATION AND EVACUATION (N/A) (Suction Dilation and Curretage) as a surgical intervention.  The patient's history has been reviewed, patient examined, no change in status, stable for surgery.  I have reviewed the patient's chart and labs.  Questions were answered to the patient's satisfaction.     Konrad Felix, MD.  03/24/2020.

## 2020-03-24 NOTE — Op Note (Addendum)
  Patient: Karina [name]  Date of birth: 01/22/1981  Date of procedure: 03/24/2020  Preop diagnosis:  1. Missed abortion at 12 weeks 2 days EGA.  2. Fetus with high risk for monosomy X on panorama test.   Postop diagnosis: Same as above.    Procedure:  1. Suction dilation and curettage.   Surgeon: Dr. Hoover Browns.  Anesthesia: General ETA.   Complications: None  Input: 1000cc LR  Output: EBL: 300cc and products of conception.    Urine: 200 cc straight catheterization.   Findings: Uterus 12 week sized. No palpable adnexal masses bilaterally. Cervix closed.    Indications: 39 year-old [id number] with a missed abortion at 12 weeks 2 days EGA here for a suction dilation and curretage.  A recent pelvic ultrasound showed a fetal pole without a fetal heart beat measuring 12 weeks 2 days EGA and patient supposed to be just over [redacted] weeks EGA by LMP.  Panorama screen had also shown increased risk of fetus with monosomy X.  She was consented for the procedure after explaining the risks, benefits and alternatives of the procedure including risks of bleeding, infection, damage to organs including uterine scarring.  She was a known blood group A pos.    She declined further genetic testing on the fetus.   Procedure: She was taken to the operating room anesthesia was administered without difficulty. Doxycyline IV was given preoperatively.  She was placed in the dorsal lithotomy position and prepped and draped in the usual sterile fashion.   Straight catheterization was performed.  Graves speculum was placed in and cervix grasped with ring forcep anteriorly.  Marcaine was given as a paracervical block, total 10 cc.  Cervix was dilated to the # 35 pratt.  # 12 curved suction currett advanced through cervix to fundus and electric suction initiated to green zone, 600 mm Hg.  Three passes were made.  Gentle sharp currettage was done and uterine walls noted to be gritty. Suction was done one more time.   Instruments removed and uterus massaged, with brisk vaginal bleeding noted.  Speculum reintroduced, suction done again and IM methergine given.  Good hemostasis was noted and all instruments were removed.  The patient was then awoken from anesthesia, she was cleaned and taken to recovery room in stable condition.  Specimen: Products of conception to pathology.   Dr. Sallye Ober, MD.  03/24/2020.

## 2020-03-24 NOTE — Anesthesia Procedure Notes (Signed)
Procedure Name: Intubation Date/Time: 03/24/2020 4:33 PM Performed by: Clearnce Sorrel, CRNA Pre-anesthesia Checklist: Patient identified, Emergency Drugs available, Suction available, Patient being monitored and Timeout performed Patient Re-evaluated:Patient Re-evaluated prior to induction Oxygen Delivery Method: Circle system utilized Preoxygenation: Pre-oxygenation with 100% oxygen Induction Type: IV induction Ventilation: Mask ventilation without difficulty and Oral airway inserted - appropriate to patient size Laryngoscope Size: Mac and 3 Grade View: Grade I Tube type: Oral Tube size: 7.0 mm Number of attempts: 1 Airway Equipment and Method: Stylet Placement Confirmation: ETT inserted through vocal cords under direct vision,  positive ETCO2 and breath sounds checked- equal and bilateral Secured at: 22 cm Tube secured with: Tape Dental Injury: Teeth and Oropharynx as per pre-operative assessment

## 2020-03-24 NOTE — Transfer of Care (Signed)
Immediate Anesthesia Transfer of Care Note  Patient: Karina [name]  Procedure(s) Performed: DILATATION AND EVACUATION (N/A Uterus)  Patient Location: PACU  Anesthesia Type:General  Level of Consciousness: awake, alert  and oriented  Airway & Oxygen Therapy: Patient Spontanous Breathing  Post-op Assessment: Report given to RN and Post -op Vital signs reviewed and stable  Post vital signs: Reviewed and stable  Last Vitals:  Vitals Value Taken Time  BP 90/33 03/24/20 1737  Temp 37 C 03/24/20 1737  Pulse 74 03/24/20 1744  Resp 20 03/24/20 1744  SpO2 99 % 03/24/20 1744  Vitals shown include unvalidated device data.  Last Pain:  Vitals:   03/24/20 1737  TempSrc:   PainSc: 0-No pain         Complications: No complications documented.

## 2020-03-24 NOTE — Anesthesia Postprocedure Evaluation (Signed)
Anesthesia Post Note  Patient: Karina [name]  Procedure(s) Performed: DILATATION AND EVACUATION (N/A Uterus)     Patient location during evaluation: PACU Anesthesia Type: General Level of consciousness: awake and alert Pain management: pain level controlled Vital Signs Assessment: post-procedure vital signs reviewed and stable Respiratory status: spontaneous breathing, nonlabored ventilation, respiratory function stable and patient connected to nasal cannula oxygen Cardiovascular status: blood pressure returned to baseline and stable Postop Assessment: no apparent nausea or vomiting Anesthetic complications: no   No complications documented.  Last Vitals:  Vitals:   03/24/20 1749 03/24/20 1804  BP: (!) 105/47 (!) 105/49  Pulse: 70 71  Resp: (!) 23 20  Temp:  (!) 36.1 C  SpO2: 98% 99%    Last Pain:  Vitals:   03/24/20 1804  TempSrc:   PainSc: 0-No pain                 Latanya Hemmer COKER

## 2020-03-25 ENCOUNTER — Ambulatory Visit: Payer: 59

## 2020-03-25 ENCOUNTER — Encounter (HOSPITAL_COMMUNITY): Payer: Self-pay | Admitting: Obstetrics & Gynecology

## 2020-03-28 ENCOUNTER — Ambulatory Visit: Payer: 59

## 2020-03-28 ENCOUNTER — Encounter (HOSPITAL_COMMUNITY): Payer: Self-pay | Admitting: Obstetrics & Gynecology

## 2020-03-28 DIAGNOSIS — O034 Incomplete spontaneous abortion without complication: Secondary | ICD-10-CM | POA: Diagnosis not present

## 2020-03-28 LAB — SURGICAL PATHOLOGY

## 2020-04-04 DIAGNOSIS — O039 Complete or unspecified spontaneous abortion without complication: Secondary | ICD-10-CM | POA: Diagnosis not present

## 2020-04-04 DIAGNOSIS — O021 Missed abortion: Secondary | ICD-10-CM | POA: Diagnosis not present

## 2020-04-04 DIAGNOSIS — R109 Unspecified abdominal pain: Secondary | ICD-10-CM | POA: Diagnosis not present

## 2020-04-05 DIAGNOSIS — K099 Cyst of oral region, unspecified: Secondary | ICD-10-CM | POA: Diagnosis not present

## 2020-04-05 DIAGNOSIS — M79662 Pain in left lower leg: Secondary | ICD-10-CM | POA: Diagnosis not present

## 2020-04-05 DIAGNOSIS — R7303 Prediabetes: Secondary | ICD-10-CM | POA: Diagnosis not present

## 2020-04-05 DIAGNOSIS — K760 Fatty (change of) liver, not elsewhere classified: Secondary | ICD-10-CM | POA: Diagnosis not present

## 2020-04-05 DIAGNOSIS — E871 Hypo-osmolality and hyponatremia: Secondary | ICD-10-CM | POA: Diagnosis not present

## 2020-04-05 DIAGNOSIS — Z6841 Body Mass Index (BMI) 40.0 and over, adult: Secondary | ICD-10-CM | POA: Diagnosis not present

## 2020-04-08 ENCOUNTER — Other Ambulatory Visit: Payer: Self-pay

## 2020-04-08 ENCOUNTER — Inpatient Hospital Stay: Payer: 59 | Attending: Hematology and Oncology

## 2020-04-08 DIAGNOSIS — E538 Deficiency of other specified B group vitamins: Secondary | ICD-10-CM | POA: Insufficient documentation

## 2020-04-08 DIAGNOSIS — O034 Incomplete spontaneous abortion without complication: Secondary | ICD-10-CM | POA: Diagnosis not present

## 2020-04-08 DIAGNOSIS — D508 Other iron deficiency anemias: Secondary | ICD-10-CM

## 2020-04-08 DIAGNOSIS — Z9889 Other specified postprocedural states: Secondary | ICD-10-CM | POA: Diagnosis not present

## 2020-04-08 LAB — CBC WITH DIFFERENTIAL/PLATELET
Abs Immature Granulocytes: 0.02 10*3/uL (ref 0.00–0.07)
Basophils Absolute: 0.1 10*3/uL (ref 0.0–0.1)
Basophils Relative: 1 %
Eosinophils Absolute: 0.6 10*3/uL — ABNORMAL HIGH (ref 0.0–0.5)
Eosinophils Relative: 6 %
HCT: 36.1 % (ref 36.0–46.0)
Hemoglobin: 12.3 g/dL (ref 12.0–15.0)
Immature Granulocytes: 0 %
Lymphocytes Relative: 37 %
Lymphs Abs: 3.6 10*3/uL (ref 0.7–4.0)
MCH: 26.7 pg (ref 26.0–34.0)
MCHC: 34.1 g/dL (ref 30.0–36.0)
MCV: 78.3 fL — ABNORMAL LOW (ref 80.0–100.0)
Monocytes Absolute: 0.9 10*3/uL (ref 0.1–1.0)
Monocytes Relative: 9 %
Neutro Abs: 4.8 10*3/uL (ref 1.7–7.7)
Neutrophils Relative %: 47 %
Platelets: 267 10*3/uL (ref 150–400)
RBC: 4.61 MIL/uL (ref 3.87–5.11)
RDW: 13.8 % (ref 11.5–15.5)
WBC: 10 10*3/uL (ref 4.0–10.5)
nRBC: 0 % (ref 0.0–0.2)

## 2020-04-11 ENCOUNTER — Telehealth: Payer: Self-pay

## 2020-04-11 DIAGNOSIS — Z6841 Body Mass Index (BMI) 40.0 and over, adult: Secondary | ICD-10-CM | POA: Diagnosis not present

## 2020-04-11 DIAGNOSIS — O034 Incomplete spontaneous abortion without complication: Secondary | ICD-10-CM | POA: Diagnosis not present

## 2020-04-11 LAB — IRON AND TIBC
Iron: 40 ug/dL — ABNORMAL LOW (ref 41–142)
Saturation Ratios: 12 % — ABNORMAL LOW (ref 21–57)
TIBC: 328 ug/dL (ref 236–444)
UIBC: 288 ug/dL (ref 120–384)

## 2020-04-11 LAB — FERRITIN: Ferritin: 51 ng/mL (ref 11–307)

## 2020-04-11 NOTE — Telephone Encounter (Signed)
Called and given below message. She verbalized understanding. She is agreeable, scheduling message sent.

## 2020-04-11 NOTE — Telephone Encounter (Signed)
-----   Message from Artis Delay, MD sent at 04/11/2020 10:14 AM EDT ----- Regarding: her labs and iron studies are stable, recommend recheck in 3 months, if she agrees, send scheduling for 3 months: labs and see me

## 2020-04-13 DIAGNOSIS — R102 Pelvic and perineal pain: Secondary | ICD-10-CM | POA: Diagnosis not present

## 2020-04-13 DIAGNOSIS — O034 Incomplete spontaneous abortion without complication: Secondary | ICD-10-CM | POA: Diagnosis not present

## 2020-04-13 DIAGNOSIS — Z09 Encounter for follow-up examination after completed treatment for conditions other than malignant neoplasm: Secondary | ICD-10-CM | POA: Diagnosis not present

## 2020-04-13 DIAGNOSIS — O039 Complete or unspecified spontaneous abortion without complication: Secondary | ICD-10-CM | POA: Diagnosis not present

## 2020-04-13 DIAGNOSIS — O021 Missed abortion: Secondary | ICD-10-CM | POA: Diagnosis not present

## 2020-04-14 DIAGNOSIS — R3129 Other microscopic hematuria: Secondary | ICD-10-CM | POA: Insufficient documentation

## 2020-04-21 DIAGNOSIS — Z1211 Encounter for screening for malignant neoplasm of colon: Secondary | ICD-10-CM | POA: Diagnosis not present

## 2020-04-21 DIAGNOSIS — K600 Acute anal fissure: Secondary | ICD-10-CM | POA: Diagnosis not present

## 2020-04-21 DIAGNOSIS — R635 Abnormal weight gain: Secondary | ICD-10-CM | POA: Diagnosis not present

## 2020-04-21 DIAGNOSIS — K760 Fatty (change of) liver, not elsewhere classified: Secondary | ICD-10-CM | POA: Diagnosis not present

## 2020-04-21 DIAGNOSIS — K625 Hemorrhage of anus and rectum: Secondary | ICD-10-CM | POA: Diagnosis not present

## 2020-04-22 DIAGNOSIS — N938 Other specified abnormal uterine and vaginal bleeding: Secondary | ICD-10-CM | POA: Diagnosis not present

## 2020-05-04 DIAGNOSIS — O039 Complete or unspecified spontaneous abortion without complication: Secondary | ICD-10-CM | POA: Diagnosis not present

## 2020-05-13 DIAGNOSIS — R7989 Other specified abnormal findings of blood chemistry: Secondary | ICD-10-CM | POA: Diagnosis not present

## 2020-05-13 DIAGNOSIS — M79662 Pain in left lower leg: Secondary | ICD-10-CM | POA: Diagnosis not present

## 2020-05-23 DIAGNOSIS — Z01419 Encounter for gynecological examination (general) (routine) without abnormal findings: Secondary | ICD-10-CM | POA: Diagnosis not present

## 2020-05-23 DIAGNOSIS — E282 Polycystic ovarian syndrome: Secondary | ICD-10-CM | POA: Diagnosis not present

## 2020-05-23 DIAGNOSIS — Z113 Encounter for screening for infections with a predominantly sexual mode of transmission: Secondary | ICD-10-CM | POA: Diagnosis not present

## 2020-05-23 DIAGNOSIS — E559 Vitamin D deficiency, unspecified: Secondary | ICD-10-CM | POA: Diagnosis not present

## 2020-06-13 ENCOUNTER — Other Ambulatory Visit (HOSPITAL_COMMUNITY): Payer: Self-pay | Admitting: Obstetrics and Gynecology

## 2020-06-13 DIAGNOSIS — N898 Other specified noninflammatory disorders of vagina: Secondary | ICD-10-CM | POA: Diagnosis not present

## 2020-06-13 DIAGNOSIS — E559 Vitamin D deficiency, unspecified: Secondary | ICD-10-CM | POA: Diagnosis not present

## 2020-06-13 DIAGNOSIS — E282 Polycystic ovarian syndrome: Secondary | ICD-10-CM | POA: Diagnosis not present

## 2020-06-13 DIAGNOSIS — R102 Pelvic and perineal pain: Secondary | ICD-10-CM | POA: Diagnosis not present

## 2020-06-14 ENCOUNTER — Other Ambulatory Visit: Payer: Self-pay | Admitting: Gastroenterology

## 2020-06-23 DIAGNOSIS — Z1231 Encounter for screening mammogram for malignant neoplasm of breast: Secondary | ICD-10-CM | POA: Diagnosis not present

## 2020-07-07 DIAGNOSIS — E282 Polycystic ovarian syndrome: Secondary | ICD-10-CM | POA: Diagnosis not present

## 2020-07-12 ENCOUNTER — Encounter: Payer: Self-pay | Admitting: Hematology and Oncology

## 2020-07-12 ENCOUNTER — Inpatient Hospital Stay: Payer: 59 | Attending: Hematology and Oncology | Admitting: Hematology and Oncology

## 2020-07-12 ENCOUNTER — Other Ambulatory Visit: Payer: Self-pay | Admitting: Hematology and Oncology

## 2020-07-12 ENCOUNTER — Other Ambulatory Visit (HOSPITAL_COMMUNITY)
Admission: RE | Admit: 2020-07-12 | Discharge: 2020-07-12 | Disposition: A | Discharge status: Home / Self Care | Payer: 59 | Source: Ambulatory Visit | Attending: Gastroenterology | Admitting: Gastroenterology

## 2020-07-12 ENCOUNTER — Other Ambulatory Visit: Payer: Self-pay

## 2020-07-12 ENCOUNTER — Inpatient Hospital Stay: Payer: 59

## 2020-07-12 ENCOUNTER — Telehealth: Payer: Self-pay | Admitting: Hematology and Oncology

## 2020-07-12 DIAGNOSIS — Z01812 Encounter for preprocedural laboratory examination: Secondary | ICD-10-CM | POA: Insufficient documentation

## 2020-07-12 DIAGNOSIS — Z20822 Contact with and (suspected) exposure to covid-19: Secondary | ICD-10-CM | POA: Insufficient documentation

## 2020-07-12 DIAGNOSIS — Z9884 Bariatric surgery status: Secondary | ICD-10-CM | POA: Diagnosis not present

## 2020-07-12 DIAGNOSIS — N920 Excessive and frequent menstruation with regular cycle: Secondary | ICD-10-CM | POA: Insufficient documentation

## 2020-07-12 DIAGNOSIS — D508 Other iron deficiency anemias: Secondary | ICD-10-CM

## 2020-07-12 DIAGNOSIS — R079 Chest pain, unspecified: Secondary | ICD-10-CM | POA: Diagnosis not present

## 2020-07-12 DIAGNOSIS — Z79899 Other long term (current) drug therapy: Secondary | ICD-10-CM | POA: Insufficient documentation

## 2020-07-12 DIAGNOSIS — D509 Iron deficiency anemia, unspecified: Secondary | ICD-10-CM | POA: Diagnosis not present

## 2020-07-12 DIAGNOSIS — K625 Hemorrhage of anus and rectum: Secondary | ICD-10-CM | POA: Diagnosis not present

## 2020-07-12 DIAGNOSIS — E538 Deficiency of other specified B group vitamins: Secondary | ICD-10-CM | POA: Diagnosis not present

## 2020-07-12 LAB — CBC WITH DIFFERENTIAL/PLATELET
Abs Immature Granulocytes: 0.04 10*3/uL (ref 0.00–0.07)
Basophils Absolute: 0.1 10*3/uL (ref 0.0–0.1)
Basophils Relative: 1 %
Eosinophils Absolute: 0.7 10*3/uL — ABNORMAL HIGH (ref 0.0–0.5)
Eosinophils Relative: 7 %
HCT: 35.8 % — ABNORMAL LOW (ref 36.0–46.0)
Hemoglobin: 11.9 g/dL — ABNORMAL LOW (ref 12.0–15.0)
Immature Granulocytes: 0 %
Lymphocytes Relative: 40 %
Lymphs Abs: 4 10*3/uL (ref 0.7–4.0)
MCH: 24.4 pg — ABNORMAL LOW (ref 26.0–34.0)
MCHC: 33.2 g/dL (ref 30.0–36.0)
MCV: 73.4 fL — ABNORMAL LOW (ref 80.0–100.0)
Monocytes Absolute: 0.8 10*3/uL (ref 0.1–1.0)
Monocytes Relative: 8 %
Neutro Abs: 4.6 10*3/uL (ref 1.7–7.7)
Neutrophils Relative %: 44 %
Platelets: 306 10*3/uL (ref 150–400)
RBC: 4.88 MIL/uL (ref 3.87–5.11)
RDW: 14.7 % (ref 11.5–15.5)
WBC: 10.1 10*3/uL (ref 4.0–10.5)
nRBC: 0 % (ref 0.0–0.2)

## 2020-07-12 LAB — IRON AND TIBC
Iron: 26 ug/dL — ABNORMAL LOW (ref 41–142)
Saturation Ratios: 8 % — ABNORMAL LOW (ref 21–57)
TIBC: 338 ug/dL (ref 236–444)
UIBC: 312 ug/dL (ref 120–384)

## 2020-07-12 LAB — SARS CORONAVIRUS 2 (TAT 6-24 HRS): SARS Coronavirus 2: NEGATIVE

## 2020-07-12 LAB — FERRITIN: Ferritin: 53 ng/mL (ref 11–307)

## 2020-07-12 NOTE — Assessment & Plan Note (Signed)
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 Repeat iron studies came back low The most likely cause of her anemia is due to chronic blood loss/malabsorption syndrome. We discussed some of the risks, benefits, and alternatives of intravenous iron infusions. The patient is symptomatic from anemia and the iron level is critically low. She tolerated oral iron supplement poorly and desires to achieved higher levels of iron faster for adequate hematopoesis. Some of the side-effects to be expected including risks of infusion reactions, phlebitis, headaches, nausea and fatigue.  The patient is willing to proceed. Patient education material was dispensed.  Goal is to keep ferritin level greater than 50 and resolution of anemia  I recommend consideration to get treatment for menorrhagia I will schedule her to get another iron infusion next week She will return here in 3 months for repeat blood work and iron studies

## 2020-07-12 NOTE — Assessment & Plan Note (Signed)
We discussed the importance of following up with gynecologist for management of heavy menstruation

## 2020-07-12 NOTE — Assessment & Plan Note (Signed)
She denies recent rectal bleeding She has appointment schedule this week for repeat colonoscopy

## 2020-07-12 NOTE — Progress Notes (Signed)
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 Repeat iron studies came back low The most likely cause of her anemia is due to chronic blood loss/malabsorption syndrome. We discussed some of the risks, benefits, and alternatives of intravenous iron infusions. The patient is symptomatic from anemia and the iron level is critically low. She tolerated oral iron supplement poorly and desires to achieved higher levels of iron faster for adequate hematopoesis. Some of the side-effects to be expected including risks of infusion reactions, phlebitis, headaches, nausea and fatigue.  The patient is willing to proceed. Patient education material was dispensed.  Goal is to keep ferritin level greater than 50 and resolution of anemia  I recommend consideration to get treatment for menorrhagia I will schedule her to get another iron infusion next week She will return here in 3 months for repeat blood work and iron studies    Rectal bleeding She denies recent rectal bleeding She has appointment schedule this week for repeat colonoscopy  Menorrhagia with regular cycle We discussed the importance of following up with gynecologist for management of heavy menstruation   No orders of the defined types were placed in this encounter.   The total time spent in the appointment was 20 minutes encounter with patients including review of chart and various tests results, discussions about plan of care and coordination of care plan   All questions were answered. The patient knows to call the clinic with any problems, questions or concerns. No barriers to learning was detected.    Artis Delay, MD 11/2/202111:45 AM  INTERVAL HISTORY: Karina [name] 39 y.o. female returns for further follow-up  for recurrent iron deficiency anemia secondary to weight loss procedure Around July 15 of this year, she suffered a miscarriage around 15 weeks of pregnancy She underwent D&C Soon after the [facility], she developed rectal bleeding She denies further rectal bleeding recently.  She is scheduled for colonoscopy this week No recent nosebleed or hematuria She noticed some intermittent heavy menstruation.  She would have menstrual cycle approximately 6 days every month, with heavy clots in the first 2 days She started chewing on ice  SUMMARY OF HEMATOLOGIC HISTORY:  Karina [name] is here because of severe iron deficiency She has diagnosis of thalassemia and history of bariatric surgery causing multiple mineral deficiencies: specifically, B12, iron, thiamine and vitamin D Her baseline blood work in July 2015 was normal. She had no recent labs since February 2017. She was found to have severe iron deficiency anemia Recently, she desired to become pregnant. She has a daughter, born in 81. Soon after that, she has an early trimester miscarriage She was evaluated by GYN and is currently on fertility treatment She was found to have abnormal CBC from recent blood work. She is recommended Iv iron therapy prior to becoming pregnant She complained of recent chest pain on exertion, shortness of breath on minimal exertion, pre-syncopal episodes, fatigue and palpitations. She had not noticed any recent bleeding such as epistaxis, hematuria or hematochezia. She had history of menorhagia The patient denies over the counter NSAID ingestion. She is not on antiplatelets agents. Her last colonoscopy was normal on 02/26/13. She had EGD on 08/28/12 which showed gastritis She had no prior history or diagnosis of cancer. Her age appropriate screening programs are up-to-date. She complained of pica  with excessive chewing of ice and eats a variety of diet. She never donated blood or received blood transfusion The patient  was prescribed oral iron supplements and she takes three daily but it caused severe constipation without success of improving her anemia She received 2 doses of intravenous iron in July 2017 with improvement of ferritin level She was lost to follow-up.  In August 2020, she received further intravenous iron for recurrent iron deficiency  I have reviewed the past medical history, past surgical history, social history and family history with the patient and they are unchanged from previous note.  ALLERGIES:  has No Known Allergies.  MEDICATIONS:  Current Outpatient Medications  Medication Sig Dispense Refill  . acetaminophen (TYLENOL) 500 MG tablet Take 500-1,000 mg by mouth every 6 (six) hours as needed for mild pain, moderate pain or headache.     . albuterol (PROVENTIL HFA;VENTOLIN HFA) 108 (90 BASE) MCG/ACT inhaler Inhale 1-2 puffs into the lungs every 6 (six) hours as needed for wheezing or shortness of breath.     . ALPRAZolam (XANAX) 0.5 MG tablet Take 0.5 mg by mouth 3 (three) times daily as needed for anxiety.    . betamethasone valerate ointment (VALISONE) 0.1 % Apply 1 application topically 2 (two) times daily as needed (skin irritation (applied to affected areas of hands)).     [name] CLENPIQ 10-3.5-12 MG-GM -GM/160ML SOLN Take 160 mLs by mouth as directed.    . Vitamin D, Ergocalciferol, (DRISDOL) 1.25 MG (50000 UNIT) CAPS capsule Take 50,000 Units by mouth every Sunday.     No current facility-administered medications for this visit.     REVIEW OF SYSTEMS:   Constitutional: Denies fevers, chills or night sweats Eyes: Denies blurriness of vision Ears, nose, mouth, throat, and face: Denies mucositis or sore throat Respiratory: Denies cough, dyspnea or wheezes Cardiovascular: Denies palpitation, chest discomfort or lower extremity swelling Gastrointestinal:  Denies nausea, heartburn or change in bowel habits Skin: Denies abnormal skin rashes Lymphatics: Denies new lymphadenopathy or  easy bruising Neurological:Denies numbness, tingling or new weaknesses Behavioral/Psych: Mood is stable, no new changes  All other systems were reviewed with the patient and are negative.  PHYSICAL EXAMINATION: ECOG PERFORMANCE STATUS: 1 - Symptomatic but completely ambulatory  Vitals:   07/12/20 1037  BP: 136/73  Pulse: 72  Resp: 18  Temp: (!) 96.3 F (35.7 C)  SpO2: 100%   Filed Weights   07/12/20 1037  Weight: (!) 317 lb (143.8 kg)    GENERAL:alert, no distress and comfortable Musculoskeletal:no cyanosis of digits and no clubbing  NEURO: alert & oriented x 3 with fluent speech, no focal motor/sensory deficits  LABORATORY DATA:  I have reviewed the data as listed     Component Value Date/Time   NA 137 08/09/2017 1500   K 4.4 08/09/2017 1500   CL 103 08/09/2017 1500   CO2 29 08/09/2017 1500   GLUCOSE 94 08/09/2017 1500   BUN 12 08/09/2017 1500   CREATININE 0.73 08/09/2017 1500   CALCIUM 8.6 (L) 08/09/2017 1500   PROT 7.9 08/21/2012 1010   ALBUMIN 3.2 (L) 08/21/2012 1010   AST 30 08/21/2012 1010   ALT 41 (H) 08/21/2012 1010   ALKPHOS 131 (H) 08/21/2012 1010   BILITOT 0.5 08/21/2012 1010   GFRNONAA >60 08/09/2017 1500   GFRAA >60 08/09/2017 1500    No results found for: SPEP, UPEP  Lab Results  Component Value Date   WBC 10.1 07/12/2020   NEUTROABS 4.6 07/12/2020   HGB  11.9 (L) 07/12/2020   HCT 35.8 (L) 07/12/2020   MCV 73.4 (L) 07/12/2020   PLT 306 07/12/2020      Chemistry      Component Value Date/Time   NA 137 08/09/2017 1500   K 4.4 08/09/2017 1500   CL 103 08/09/2017 1500   CO2 29 08/09/2017 1500   BUN 12 08/09/2017 1500   CREATININE 0.73 08/09/2017 1500      Component Value Date/Time   CALCIUM 8.6 (L) 08/09/2017 1500   ALKPHOS 131 (H) 08/21/2012 1010   AST 30 08/21/2012 1010   ALT 41 (H) 08/21/2012 1010   BILITOT 0.5 08/21/2012 1010

## 2020-07-12 NOTE — Telephone Encounter (Signed)
Scheduled appointments per 11/2 sch msg. Scheduled first infusion for 11/19 because of patient's work schedule and their request. Patient is aware of all upcoming appointments dates and times.

## 2020-07-14 ENCOUNTER — Ambulatory Visit (HOSPITAL_COMMUNITY): Payer: 59 | Admitting: Certified Registered Nurse Anesthetist

## 2020-07-14 ENCOUNTER — Encounter (HOSPITAL_COMMUNITY): Payer: Self-pay | Admitting: Gastroenterology

## 2020-07-14 ENCOUNTER — Other Ambulatory Visit: Payer: Self-pay

## 2020-07-14 ENCOUNTER — Ambulatory Visit (HOSPITAL_COMMUNITY)
Admission: RE | Admit: 2020-07-14 | Discharge: 2020-07-14 | Disposition: A | Discharge status: Home / Self Care | Payer: 59 | Attending: Gastroenterology | Admitting: Gastroenterology

## 2020-07-14 ENCOUNTER — Encounter (HOSPITAL_COMMUNITY)
Admission: RE | Disposition: A | Discharge status: Home / Self Care | Payer: Self-pay | Source: Home / Self Care | Attending: Gastroenterology

## 2020-07-14 DIAGNOSIS — K219 Gastro-esophageal reflux disease without esophagitis: Secondary | ICD-10-CM | POA: Diagnosis not present

## 2020-07-14 DIAGNOSIS — Z6841 Body Mass Index (BMI) 40.0 and over, adult: Secondary | ICD-10-CM | POA: Insufficient documentation

## 2020-07-14 DIAGNOSIS — K529 Noninfective gastroenteritis and colitis, unspecified: Secondary | ICD-10-CM | POA: Insufficient documentation

## 2020-07-14 DIAGNOSIS — D509 Iron deficiency anemia, unspecified: Secondary | ICD-10-CM | POA: Diagnosis not present

## 2020-07-14 DIAGNOSIS — K648 Other hemorrhoids: Secondary | ICD-10-CM | POA: Diagnosis not present

## 2020-07-14 DIAGNOSIS — K625 Hemorrhage of anus and rectum: Secondary | ICD-10-CM | POA: Diagnosis not present

## 2020-07-14 DIAGNOSIS — Z1211 Encounter for screening for malignant neoplasm of colon: Secondary | ICD-10-CM | POA: Diagnosis not present

## 2020-07-14 HISTORY — PX: COLONOSCOPY WITH PROPOFOL: SHX5780

## 2020-07-14 HISTORY — PX: BIOPSY: SHX5522

## 2020-07-14 LAB — PREGNANCY, URINE: Preg Test, Ur: NEGATIVE

## 2020-07-14 SURGERY — COLONOSCOPY WITH PROPOFOL
Anesthesia: Monitor Anesthesia Care

## 2020-07-14 MED ORDER — LACTATED RINGERS IV SOLN: INTRAVENOUS | Status: DC | PRN

## 2020-07-14 MED ORDER — SODIUM CHLORIDE 0.9 % IV SOLN: INTRAVENOUS | Status: DC

## 2020-07-14 MED ORDER — LACTATED RINGERS IV SOLN
INTRAVENOUS | Status: DC
  Administered 2020-07-14: 1000 mL via INTRAVENOUS

## 2020-07-14 MED ORDER — PROPOFOL 500 MG/50ML IV EMUL
INTRAVENOUS | Status: DC | PRN
  Administered 2020-07-14: 100 ug/kg/min via INTRAVENOUS

## 2020-07-14 MED ORDER — LIDOCAINE 2% (20 MG/ML) 5 ML SYRINGE
INTRAMUSCULAR | Status: DC | PRN
  Administered 2020-07-14: 80 mg via INTRAVENOUS

## 2020-07-14 MED ORDER — PROPOFOL 10 MG/ML IV BOLUS
INTRAVENOUS | Status: DC | PRN
  Administered 2020-07-14: 30 mg via INTRAVENOUS
  Administered 2020-07-14 (×2): 20 mg via INTRAVENOUS
  Administered 2020-07-14: 30 mg via INTRAVENOUS
  Administered 2020-07-14: 40 mg via INTRAVENOUS

## 2020-07-14 MED ORDER — PROPOFOL 10 MG/ML IV BOLUS
INTRAVENOUS | Status: AC
  Filled 2020-07-14: qty 20

## 2020-07-14 SURGICAL SUPPLY — 21 items

## 2020-07-14 NOTE — Transfer of Care (Signed)
Immediate Anesthesia Transfer of Care Note  Patient: Karina [name]  Procedure(s) Performed: COLONOSCOPY WITH PROPOFOL (N/A ) BIOPSY  Patient Location: Endoscopy Unit  Anesthesia Type:MAC  Level of Consciousness: awake, alert , oriented and patient cooperative  Airway & Oxygen Therapy: Patient Spontanous Breathing and Patient connected to face mask oxygen  Post-op Assessment: Report given to RN, Post -op Vital signs reviewed and stable and Patient moving all extremities  Post vital signs: Reviewed and stable  Last Vitals:  Vitals Value Taken Time  BP 96/44 07/14/20 0800  Temp    Pulse 84 07/14/20 0800  Resp 18 07/14/20 0800  SpO2 100 % 07/14/20 0800  Vitals shown include unvalidated device data.  Last Pain:  Vitals:   07/14/20 0719  TempSrc: Oral  PainSc: 0-No pain         Complications: No complications documented.

## 2020-07-14 NOTE — Anesthesia Postprocedure Evaluation (Signed)
Anesthesia Post Note  Patient: Karina [name]  Procedure(s) Performed: COLONOSCOPY WITH PROPOFOL (N/A ) BIOPSY     Patient location during evaluation: PACU Anesthesia Type: MAC Level of consciousness: awake and alert Pain management: pain level controlled Vital Signs Assessment: post-procedure vital signs reviewed and stable Respiratory status: spontaneous breathing, nonlabored ventilation, respiratory function stable and patient connected to nasal cannula oxygen Cardiovascular status: stable and blood pressure returned to baseline Postop Assessment: no apparent nausea or vomiting Anesthetic complications: no   No complications documented.  Last Vitals:  Vitals:   07/14/20 0810 07/14/20 0820  BP: 123/63 124/63  Pulse: 72 (!) 56  Resp: 15 14  Temp:    SpO2: 99% 100%    Last Pain:  Vitals:   07/14/20 0820  TempSrc:   PainSc: 0-No pain                 [name] DAVID

## 2020-07-14 NOTE — Discharge Instructions (Signed)

## 2020-07-14 NOTE — Anesthesia Preprocedure Evaluation (Signed)
Anesthesia Evaluation  Patient identified by MRN, date of birth, ID band Patient awake    Reviewed: Allergy & Precautions, NPO status , Patient's Chart, lab work & pertinent test results  Airway Mallampati: I  TM Distance: >3 FB Neck ROM: Full    Dental   Pulmonary sleep apnea , former smoker,    Pulmonary exam normal        Cardiovascular Normal cardiovascular exam     Neuro/Psych Anxiety Depression    GI/Hepatic GERD  Medicated and Controlled,  Endo/Other    Renal/GU      Musculoskeletal   Abdominal   Peds  Hematology   Anesthesia Other Findings   Reproductive/Obstetrics                             Anesthesia Physical Anesthesia Plan  ASA: III  Anesthesia Plan: MAC   Post-op Pain Management:    Induction: Intravenous  PONV Risk Score and Plan: 2  Airway Management Planned: Nasal Cannula  Additional Equipment:   Intra-op Plan:   Post-operative Plan:   Informed Consent: I have reviewed the patients History and Physical, chart, labs and discussed the procedure including the risks, benefits and alternatives for the proposed anesthesia with the patient or authorized representative who has indicated his/her understanding and acceptance.       Plan Discussed with: CRNA and Surgeon  Anesthesia Plan Comments:         Anesthesia Quick Evaluation

## 2020-07-14 NOTE — H&P (Signed)
Karina [name] is an 39 y.o. female.   Chief Complaint: Rectal bleeding. HPI: The patient is a 39 year old Hispanic female with multiple medical problems listed below along with morbid obesity who presents to [facility] today for diagnostic colonoscopy to further evaluate her rectal bleeding.  See office notes for details.  Past Medical History:  Diagnosis Date  . Anemia    takes iron daily  . Anxiety    no meds  . Asthma    albuterol as needed  . Chronic bronchitis    bronchitis x 1 event in her twenties  . Complication of anesthesia    decreased respirations with lap band sx  . Dysrhythmia     " arrhythmia during endoscopy procedure "   . Edema extremities    hx of edema in lower ext.used to take lasix - none since twenties  . Gallbladder disease   . GERD (gastroesophageal reflux disease)    nexium   . Hemoglobin C trait (HCC)   . History of chicken pox   . HSV (herpes simplex virus) anogenital infection   . Neuropathy    feet  . Pre-diabetes   . Pregnant    svd 06/19/17  . Sleep apnea    Does not use cpap nightly  . SVD (spontaneous vaginal delivery)    x 2  . Wears glasses    Past Surgical History:  Procedure Laterality Date  . CARPAL TUNNEL RELEASE Bilateral 2008, 2010  . CHOLECYSTECTOMY  01/11/2012   Procedure: LAPAROSCOPIC CHOLECYSTECTOMY WITH INTRAOPERATIVE CHOLANGIOGRAM;  Surgeon: Cherylynn Ridges, MD;  Location: [facility] OR;  Service: General;  Laterality: N/A;  . COLONOSCOPY    . DILATION AND EVACUATION N/A 03/24/2020   Procedure: DILATATION AND EVACUATION;  Surgeon: Hoover Browns, MD;  Location: [facility] OR;  Service: Gynecology;  Laterality: N/A;  . LAPAROSCOPIC BILATERAL SALPINGECTOMY Right 08/14/2017   Procedure: LAPAROSCOPIC RIGHT  SALPINGECTOMY;  Surgeon: Silverio Lay, MD;  Location: [facility] ORS;  Service: Gynecology;  Laterality: Right;  . LAPAROSCOPIC GASTRIC BANDING    . UPPER GI ENDOSCOPY    . WISDOM TOOTH EXTRACTION     Family History  Problem Relation  Age of Onset  . Diabetes Sister   . Asthma Sister   . Heart disease Other   . Hyperlipidemia Other   . Hypertension Other   . Stroke Other   . Kidney disease Other   . Thyroid disease Other   . Ovarian cancer Maternal Aunt   . Prostate cancer Maternal Uncle   . Cervical cancer Maternal Aunt        cervial and thyroid  . Liver cancer Paternal Grandfather   . Prostate cancer Paternal Grandfather   . Liver cancer Paternal Uncle   . Anesthesia problems Mother   . Asthma Maternal Grandmother   . Hypotension Neg Hx   . Malignant hyperthermia Neg Hx   . Pseudochol deficiency Neg Hx    Social History:  reports that she quit smoking about 24 years ago. Her smoking use included cigars and cigarettes. She has a 0.25 pack-year smoking history. She has never used smokeless tobacco. She reports previous alcohol use. She reports that she does not use drugs.  Allergies: No Known Allergies  Medications Prior to Admission  Medication Sig Dispense Refill  . acetaminophen (TYLENOL) 500 MG tablet Take 500-1,000 mg by mouth every 6 (six) hours as needed for mild pain, moderate pain or headache.     . albuterol (PROVENTIL HFA;VENTOLIN HFA) 108 (90  BASE) MCG/ACT inhaler Inhale 1-2 puffs into the lungs every 6 (six) hours as needed for wheezing or shortness of breath.     . ALPRAZolam (XANAX) 0.5 MG tablet Take 0.5 mg by mouth 3 (three) times daily as needed for anxiety.    . betamethasone valerate ointment (VALISONE) 0.1 % Apply 1 application topically 2 (two) times daily as needed (skin irritation (applied to affected areas of hands)).     . Vitamin D, Ergocalciferol, (DRISDOL) 1.25 MG (50000 UNIT) CAPS capsule Take 50,000 Units by mouth every Sunday.    [name] CLENPIQ 10-3.5-12 MG-GM -GM/160ML SOLN Take 160 mLs by mouth as directed.     Results for orders placed or performed during the hospital encounter of 07/14/20 (from the past 48 hour(s))  Pregnancy, urine     Status: None   Collection Time: 07/14/20   7:00 AM  Result Value Ref Range   Preg Test, Ur NEGATIVE NEGATIVE    Comment:        THE SENSITIVITY OF THIS METHODOLOGY IS >20 mIU/mL. Performed at [facility], [address], [place], [place] [address]     Review of Systems  Constitutional: Negative.   HENT: Negative.   Eyes: Negative.   Respiratory: Negative.   Cardiovascular: Negative.   Gastrointestinal: Positive for abdominal distention, blood in stool and rectal pain. Negative for abdominal pain, constipation, diarrhea and nausea.  Endocrine: Negative.   Genitourinary: Negative.   Musculoskeletal: Positive for arthralgias and back pain.  Allergic/Immunologic: Negative.   Neurological: Negative.   Hematological: Negative.   Psychiatric/Behavioral: The patient is nervous/anxious.    currently breastfeeding. Physical Exam Constitutional:      General: She is not in acute distress.    Appearance: Normal appearance. She is obese.  HENT:     Head: Normocephalic and atraumatic.     Mouth/Throat:     Mouth: Mucous membranes are dry.  Eyes:     Extraocular Movements: Extraocular movements intact.     Conjunctiva/sclera: Conjunctivae normal.     Pupils: Pupils are equal, round, and reactive to light.  Cardiovascular:     Rate and Rhythm: Normal rate and regular rhythm.     Pulses: Normal pulses.     Heart sounds: Normal heart sounds.  Pulmonary:     Effort: Pulmonary effort is normal.     Breath sounds: Normal breath sounds.  Abdominal:     General: Bowel sounds are normal. There is no distension.     Palpations: Abdomen is soft. There is no mass.     Tenderness: There is no abdominal tenderness. There is no guarding or rebound.     Hernia: No hernia is present.  Musculoskeletal:        General: Normal range of motion.     Cervical back: Normal range of motion and neck supple.  Skin:    General: Skin is warm and dry.  Neurological:     General: No focal deficit present.     Mental Status: She  is alert and oriented to person, place, and time. Mental status is at baseline.  Psychiatric:        Mood and Affect: Mood normal.        Behavior: Behavior normal.        Thought Content: Thought content normal.        Judgment: Judgment normal.    Assessment/Plan Rectal bleeding-proceed with a colonoscopy at this time.  Charna Elizabeth, MD 07/14/2020, 7:19 AM

## 2020-07-14 NOTE — Op Note (Signed)
[facility] Patient Name: Karina [name] Procedure Date: 07/14/2020 MRN: [id number] Attending MD: Juanita Craver , MD Date of Birth: [birth year] CSN: [id number] Age: 39 Admit Type: Outpatient Procedure:                Colonoscopy with biopsies. Indications:              Rectal bleeding: CRC screening for colorectal                            malignant neoplasm. Providers:                Juanita Craver, MD, Cleda Daub, RN, Janee Morn, Technician, Caryl Pina CRNA Referring MD:             Donnel Saxon, NP, Heath Lark, MD, Maxie Better,                            PA-C Medicines:                Monitored Anesthesia Care Complications:            No immediate complications. Estimated Blood Loss:     Estimated blood loss was minimal. Procedure:                Pre-Anesthesia Assessment: - Prior to the                            procedure, a history and physical was performed,                            and patient medications and allergies were                            reviewed. The patient's tolerance of previous                            anesthesia was also reviewed. The risks and                            benefits of the procedure and the sedation options                            and risks were discussed with the patient. All                            questions were answered, and informed consent was                            obtained. Prior Anticoagulants: The patient has                            taken no previous anticoagulant or antiplatelet  agents. ASA Grade Assessment: III - A patient with                            severe systemic disease. After reviewing the risks                            and benefits, the patient was deemed in                            satisfactory condition to undergo the procedure.                            After obtaining informed consent, the colonoscope                             was passed under direct vision. Throughout the                            procedure, the patient's blood pressure, pulse, and                            oxygen saturations were monitored continuously. The                            CF-HQ190L ([id number]) Olympus colonoscope was                            introduced through the anus and advanced to the the                            terminal ileum, with identification of the                            appendiceal orifice and IC valve. The colonoscopy                            was performed without difficulty. The patient                            tolerated the procedure well. The quality of the                            bowel preparation was adequate. The terminal ileum,                            the ileocecal valve, the appendiceal orifice and                            the rectum were photographed. The bowel preparation                            used was Clenpiq via split dose instruction. Mild c Scope In: 7:35:43 AM Scope Out: 7:52:01 AM Scope Withdrawal Time: 0 hours 7  minutes 30 seconds  Total Procedure Duration: 0 hours 16 minutes 18 seconds  Findings:      Patchy mild inflammation characterized by friability and granularity and       scattered exudates were found in the cecum and proximal right       colon-biopsies were taken with a cold forceps for histology.      The terminal ileum appeared normal.      Small internal hemorrhoid was noted on retroflexion.      The exam was otherwise without abnormality. Impression:               - Mild inflammation was found in the proximal                            ascending colon and in the cecum-?colitis-biopsies                            done.                           - The examined portion of the ileum was normal.                           - Small internal hemorrhoid.                           - The examination was otherwise normal. Moderate Sedation:      MAC  used. Recommendation:           - High fiber, low fat diet with augmented water                            consumption daily.                           - Continue present medications.                           - Await pathology results.                           - Repeat colonoscopy in 10 years for surveillance.                           - Return to GI office in 2 weeks.                           - If the patient has any abnormal GI symptoms in                            the interim, she has been advised to call the                            office ASAP for further recommendations. Procedure Code(s):        --- Professional ---  45380, Colonoscopy, flexible; with biopsy, single                            or multiple Diagnosis Code(s):        --- Professional ---                           K62.5, Hemorrhage of anus and rectum                           K52.9, Noninfective gastroenteritis and colitis,                            unspecified                           Z12.11, Encounter for screening for malignant                            neoplasm of colon CPT copyright 2019 American Medical Association. All rights reserved. The codes documented in this report are preliminary and upon coder review may  be revised to meet current compliance requirements. Juanita Craver, MD Juanita Craver, MD 07/14/2020 8:07:40 AM This report has been signed electronically. Number of Addenda: 0

## 2020-07-15 ENCOUNTER — Encounter (HOSPITAL_COMMUNITY): Payer: Self-pay | Admitting: Gastroenterology

## 2020-07-15 LAB — SURGICAL PATHOLOGY

## 2020-07-18 ENCOUNTER — Other Ambulatory Visit (HOSPITAL_COMMUNITY): Payer: Self-pay | Admitting: Gastroenterology

## 2020-07-19 ENCOUNTER — Ambulatory Visit: Payer: 59

## 2020-07-19 ENCOUNTER — Other Ambulatory Visit (HOSPITAL_COMMUNITY): Payer: Self-pay | Admitting: Obstetrics and Gynecology

## 2020-07-19 DIAGNOSIS — R945 Abnormal results of liver function studies: Secondary | ICD-10-CM | POA: Diagnosis not present

## 2020-07-19 DIAGNOSIS — E221 Hyperprolactinemia: Secondary | ICD-10-CM | POA: Diagnosis not present

## 2020-07-19 DIAGNOSIS — E8881 Metabolic syndrome: Secondary | ICD-10-CM | POA: Diagnosis not present

## 2020-07-22 ENCOUNTER — Other Ambulatory Visit: Payer: Self-pay | Admitting: Obstetrics and Gynecology

## 2020-07-22 ENCOUNTER — Other Ambulatory Visit: Payer: Self-pay | Admitting: Hematology and Oncology

## 2020-07-22 DIAGNOSIS — E221 Hyperprolactinemia: Secondary | ICD-10-CM

## 2020-07-26 ENCOUNTER — Ambulatory Visit: Payer: 59

## 2020-07-27 ENCOUNTER — Other Ambulatory Visit (HOSPITAL_COMMUNITY): Payer: Self-pay | Admitting: Physician Assistant

## 2020-07-29 ENCOUNTER — Other Ambulatory Visit: Payer: Self-pay

## 2020-07-29 ENCOUNTER — Ambulatory Visit (HOSPITAL_BASED_OUTPATIENT_CLINIC_OR_DEPARTMENT_OTHER): Payer: 59 | Admitting: Medical

## 2020-07-29 ENCOUNTER — Inpatient Hospital Stay: Payer: 59

## 2020-07-29 VITALS — BP 103/67 | HR 60 | Temp 98.5°F | Resp 18

## 2020-07-29 DIAGNOSIS — E538 Deficiency of other specified B group vitamins: Secondary | ICD-10-CM | POA: Diagnosis not present

## 2020-07-29 DIAGNOSIS — Z9884 Bariatric surgery status: Secondary | ICD-10-CM | POA: Diagnosis not present

## 2020-07-29 DIAGNOSIS — N920 Excessive and frequent menstruation with regular cycle: Secondary | ICD-10-CM | POA: Diagnosis not present

## 2020-07-29 DIAGNOSIS — D509 Iron deficiency anemia, unspecified: Secondary | ICD-10-CM | POA: Diagnosis not present

## 2020-07-29 DIAGNOSIS — D508 Other iron deficiency anemias: Secondary | ICD-10-CM

## 2020-07-29 DIAGNOSIS — Z20822 Contact with and (suspected) exposure to covid-19: Secondary | ICD-10-CM | POA: Diagnosis not present

## 2020-07-29 DIAGNOSIS — T8090XA Unspecified complication following infusion and therapeutic injection, initial encounter: Secondary | ICD-10-CM

## 2020-07-29 DIAGNOSIS — R079 Chest pain, unspecified: Secondary | ICD-10-CM | POA: Diagnosis not present

## 2020-07-29 DIAGNOSIS — Z79899 Other long term (current) drug therapy: Secondary | ICD-10-CM | POA: Diagnosis not present

## 2020-07-29 MED ORDER — SODIUM CHLORIDE 0.9 % IV SOLN
200.0000 mg | Freq: Once | INTRAVENOUS | Status: AC
  Administered 2020-07-29: 200 mg via INTRAVENOUS
  Filled 2020-07-29: qty 200

## 2020-07-29 MED ORDER — FAMOTIDINE IN NACL 20-0.9 MG/50ML-% IV SOLN
20.0000 mg | Freq: Once | INTRAVENOUS | Status: AC | PRN
  Administered 2020-07-29: 20 mg via INTRAVENOUS

## 2020-07-29 MED ORDER — FAMOTIDINE IN NACL 20-0.9 MG/50ML-% IV SOLN
INTRAVENOUS | Status: AC
  Filled 2020-07-29: qty 50

## 2020-07-29 MED ORDER — SODIUM CHLORIDE 0.9 % IV SOLN
Freq: Once | INTRAVENOUS | Status: AC
  Filled 2020-07-29: qty 250

## 2020-07-29 NOTE — Progress Notes (Signed)
Pt reports chest tightness and reflux at end of iron infusion.  PA Zenaida Niece contacted, ordered Pepcid IV 20mg .  Pt reports resolution of symptoms.  Observed for 30 min total after infusion of iron, otherwise tolerated well.  VS stable.  MD made aware of reaction.

## 2020-07-29 NOTE — Patient Instructions (Signed)

## 2020-08-02 NOTE — Progress Notes (Signed)
    DATE:  07/29/2020                                          X  CHEMO/IMMUNOTHERAPY REACTION            MD:   Dr. Artis Delay   AGENT/BLOOD PRODUCT RECEIVING TODAY:              Venofer   AGENT/BLOOD PRODUCT RECEIVING IMMEDIATELY PRIOR TO REACTION:          Venofer   VS: BP:      106/66   P:        85       SPO2:        99% on room air  R: 18 T: 98.5                  REACTION(S):           Shortness of breath, reflux, and chest tightness after completion of a Venofer infusion   PREMEDS:       None   INTERVENTION: The patient was given Pepcid 20 mg IV x1   Review of Systems  Review of Systems  Constitutional: Negative for chills, diaphoresis and fever.  HENT: Negative for trouble swallowing and voice change.   Respiratory: Positive for chest tightness and shortness of breath. Negative for cough and wheezing.   Cardiovascular: Negative for chest pain and palpitations.  Gastrointestinal: Negative for abdominal pain, constipation, diarrhea, nausea and vomiting.       GERD  Musculoskeletal: Negative for back pain and myalgias.  Neurological: Negative for dizziness, light-headedness and headaches.     Physical Exam  Physical Exam Constitutional:      General: She is not in acute distress.    Appearance: She is not diaphoretic.  HENT:     Head: Normocephalic and atraumatic.  Eyes:     General: No scleral icterus.       Right eye: No discharge.        Left eye: No discharge.     Conjunctiva/sclera: Conjunctivae normal.  Cardiovascular:     Rate and Rhythm: Normal rate and regular rhythm.     Heart sounds: Normal heart sounds. No murmur heard.  No friction rub. No gallop.   Pulmonary:     Effort: Pulmonary effort is normal. No respiratory distress.     Breath sounds: Normal breath sounds. No wheezing or rales.  Skin:    General: Skin is warm and dry.     Findings: No erythema or rash.  Neurological:     Mental Status: She is alert.  Psychiatric:        Mood and  Affect: Mood normal.        Behavior: Behavior normal.        Thought Content: Thought content normal.        Judgment: Judgment normal.     OUTCOME:                   Marga Hoots, MHS, PA-C

## 2020-08-05 ENCOUNTER — Other Ambulatory Visit: Payer: Self-pay

## 2020-08-05 ENCOUNTER — Inpatient Hospital Stay: Payer: 59

## 2020-08-05 VITALS — BP 114/68 | HR 69 | Temp 98.4°F | Resp 16

## 2020-08-05 DIAGNOSIS — D509 Iron deficiency anemia, unspecified: Secondary | ICD-10-CM | POA: Diagnosis not present

## 2020-08-05 DIAGNOSIS — Z79899 Other long term (current) drug therapy: Secondary | ICD-10-CM | POA: Diagnosis not present

## 2020-08-05 DIAGNOSIS — D508 Other iron deficiency anemias: Secondary | ICD-10-CM

## 2020-08-05 DIAGNOSIS — R079 Chest pain, unspecified: Secondary | ICD-10-CM | POA: Diagnosis not present

## 2020-08-05 DIAGNOSIS — N920 Excessive and frequent menstruation with regular cycle: Secondary | ICD-10-CM | POA: Diagnosis not present

## 2020-08-05 DIAGNOSIS — Z20822 Contact with and (suspected) exposure to covid-19: Secondary | ICD-10-CM | POA: Diagnosis not present

## 2020-08-05 DIAGNOSIS — E538 Deficiency of other specified B group vitamins: Secondary | ICD-10-CM | POA: Diagnosis not present

## 2020-08-05 DIAGNOSIS — Z9884 Bariatric surgery status: Secondary | ICD-10-CM | POA: Diagnosis not present

## 2020-08-05 MED ORDER — SODIUM CHLORIDE 0.9 % IV SOLN
Freq: Once | INTRAVENOUS | Status: AC
  Filled 2020-08-05: qty 250

## 2020-08-05 MED ORDER — DIPHENHYDRAMINE HCL 25 MG PO CAPS
25.0000 mg | ORAL_CAPSULE | Freq: Once | ORAL | Status: AC
  Administered 2020-08-05: 25 mg via ORAL

## 2020-08-05 MED ORDER — SODIUM CHLORIDE 0.9 % IV SOLN
200.0000 mg | Freq: Once | INTRAVENOUS | Status: AC
  Administered 2020-08-05: 200 mg via INTRAVENOUS
  Filled 2020-08-05: qty 10

## 2020-08-05 MED ORDER — DIPHENHYDRAMINE HCL 25 MG PO CAPS
ORAL_CAPSULE | ORAL | Status: AC
  Filled 2020-08-05: qty 1

## 2020-08-05 NOTE — Progress Notes (Signed)
Patient stable at time of discharge. 

## 2020-08-05 NOTE — Patient Instructions (Signed)

## 2020-08-09 DIAGNOSIS — R1310 Dysphagia, unspecified: Secondary | ICD-10-CM | POA: Diagnosis not present

## 2020-08-09 DIAGNOSIS — K760 Fatty (change of) liver, not elsewhere classified: Secondary | ICD-10-CM | POA: Diagnosis not present

## 2020-08-09 DIAGNOSIS — K5190 Ulcerative colitis, unspecified, without complications: Secondary | ICD-10-CM | POA: Diagnosis not present

## 2020-08-10 ENCOUNTER — Other Ambulatory Visit: Payer: Self-pay | Admitting: Gastroenterology

## 2020-08-10 DIAGNOSIS — R1310 Dysphagia, unspecified: Secondary | ICD-10-CM

## 2020-08-18 ENCOUNTER — Ambulatory Visit
Admission: RE | Admit: 2020-08-18 | Discharge: 2020-08-18 | Disposition: A | Discharge status: Home / Self Care | Payer: 59 | Source: Ambulatory Visit | Attending: Obstetrics and Gynecology | Admitting: Obstetrics and Gynecology

## 2020-08-18 DIAGNOSIS — E221 Hyperprolactinemia: Secondary | ICD-10-CM | POA: Diagnosis not present

## 2020-08-18 MED ORDER — GADOBENATE DIMEGLUMINE 529 MG/ML IV SOLN
10.0000 mL | Freq: Once | INTRAVENOUS | Status: AC | PRN
  Administered 2020-08-18: 10 mL via INTRAVENOUS

## 2020-08-24 ENCOUNTER — Other Ambulatory Visit: Payer: Self-pay | Admitting: Obstetrics and Gynecology

## 2020-08-24 ENCOUNTER — Other Ambulatory Visit (HOSPITAL_COMMUNITY): Payer: Self-pay | Admitting: Obstetrics and Gynecology

## 2020-08-24 DIAGNOSIS — E8881 Metabolic syndrome: Secondary | ICD-10-CM | POA: Diagnosis not present

## 2020-08-24 DIAGNOSIS — Z1231 Encounter for screening mammogram for malignant neoplasm of breast: Secondary | ICD-10-CM

## 2020-08-24 DIAGNOSIS — E221 Hyperprolactinemia: Secondary | ICD-10-CM | POA: Diagnosis not present

## 2020-08-24 DIAGNOSIS — R945 Abnormal results of liver function studies: Secondary | ICD-10-CM | POA: Diagnosis not present

## 2020-09-01 ENCOUNTER — Ambulatory Visit
Admission: RE | Admit: 2020-09-01 | Discharge: 2020-09-01 | Disposition: A | Discharge status: Home / Self Care | Payer: 59 | Source: Ambulatory Visit | Attending: Gastroenterology | Admitting: Gastroenterology

## 2020-09-01 ENCOUNTER — Other Ambulatory Visit: Payer: Self-pay | Admitting: Gastroenterology

## 2020-09-01 DIAGNOSIS — R1310 Dysphagia, unspecified: Secondary | ICD-10-CM

## 2020-09-01 DIAGNOSIS — K224 Dyskinesia of esophagus: Secondary | ICD-10-CM | POA: Diagnosis not present

## 2020-09-08 DIAGNOSIS — M25551 Pain in right hip: Secondary | ICD-10-CM | POA: Diagnosis not present

## 2020-09-12 ENCOUNTER — Other Ambulatory Visit (HOSPITAL_COMMUNITY): Payer: Self-pay | Admitting: Physician Assistant

## 2020-09-16 ENCOUNTER — Other Ambulatory Visit: Payer: 59

## 2020-09-16 ENCOUNTER — Other Ambulatory Visit: Payer: Self-pay

## 2020-09-16 DIAGNOSIS — Z20822 Contact with and (suspected) exposure to covid-19: Secondary | ICD-10-CM | POA: Diagnosis not present

## 2020-09-20 LAB — NOVEL CORONAVIRUS, NAA: SARS-CoV-2, NAA: NOT DETECTED

## 2020-10-03 ENCOUNTER — Ambulatory Visit: Payer: 59

## 2020-10-12 ENCOUNTER — Other Ambulatory Visit: Payer: 59

## 2020-10-12 ENCOUNTER — Ambulatory Visit: Payer: 59 | Admitting: Hematology and Oncology

## 2020-10-13 ENCOUNTER — Other Ambulatory Visit: Payer: Self-pay | Admitting: Hematology and Oncology

## 2020-10-13 DIAGNOSIS — D508 Other iron deficiency anemias: Secondary | ICD-10-CM

## 2020-10-14 ENCOUNTER — Inpatient Hospital Stay: Payer: 59 | Admitting: Hematology and Oncology

## 2020-10-14 ENCOUNTER — Other Ambulatory Visit: Payer: Self-pay

## 2020-10-14 ENCOUNTER — Telehealth: Payer: Self-pay | Admitting: Hematology and Oncology

## 2020-10-14 ENCOUNTER — Encounter: Payer: Self-pay | Admitting: Hematology and Oncology

## 2020-10-14 ENCOUNTER — Inpatient Hospital Stay: Payer: 59 | Attending: Hematology and Oncology

## 2020-10-14 ENCOUNTER — Telehealth: Payer: Self-pay

## 2020-10-14 VITALS — BP 102/57 | HR 77 | Temp 97.4°F | Resp 18 | Ht 65.0 in | Wt 327.4 lb

## 2020-10-14 DIAGNOSIS — K625 Hemorrhage of anus and rectum: Secondary | ICD-10-CM

## 2020-10-14 DIAGNOSIS — Z9884 Bariatric surgery status: Secondary | ICD-10-CM | POA: Diagnosis not present

## 2020-10-14 DIAGNOSIS — Z79899 Other long term (current) drug therapy: Secondary | ICD-10-CM | POA: Insufficient documentation

## 2020-10-14 DIAGNOSIS — D509 Iron deficiency anemia, unspecified: Secondary | ICD-10-CM | POA: Diagnosis not present

## 2020-10-14 DIAGNOSIS — N920 Excessive and frequent menstruation with regular cycle: Secondary | ICD-10-CM

## 2020-10-14 DIAGNOSIS — Z7984 Long term (current) use of oral hypoglycemic drugs: Secondary | ICD-10-CM | POA: Diagnosis not present

## 2020-10-14 DIAGNOSIS — D508 Other iron deficiency anemias: Secondary | ICD-10-CM

## 2020-10-14 DIAGNOSIS — E282 Polycystic ovarian syndrome: Secondary | ICD-10-CM

## 2020-10-14 LAB — CBC WITH DIFFERENTIAL/PLATELET
Abs Immature Granulocytes: 0.02 10*3/uL (ref 0.00–0.07)
Basophils Absolute: 0.1 10*3/uL (ref 0.0–0.1)
Basophils Relative: 1 %
Eosinophils Absolute: 0.4 10*3/uL (ref 0.0–0.5)
Eosinophils Relative: 4 %
HCT: 36.1 % (ref 36.0–46.0)
Hemoglobin: 12 g/dL (ref 12.0–15.0)
Immature Granulocytes: 0 %
Lymphocytes Relative: 39 %
Lymphs Abs: 3.5 10*3/uL (ref 0.7–4.0)
MCH: 24.5 pg — ABNORMAL LOW (ref 26.0–34.0)
MCHC: 33.2 g/dL (ref 30.0–36.0)
MCV: 73.7 fL — ABNORMAL LOW (ref 80.0–100.0)
Monocytes Absolute: 0.7 10*3/uL (ref 0.1–1.0)
Monocytes Relative: 7 %
Neutro Abs: 4.4 10*3/uL (ref 1.7–7.7)
Neutrophils Relative %: 49 %
Platelets: 279 10*3/uL (ref 150–400)
RBC: 4.9 MIL/uL (ref 3.87–5.11)
RDW: 14.9 % (ref 11.5–15.5)
WBC: 9 10*3/uL (ref 4.0–10.5)
nRBC: 0 % (ref 0.0–0.2)

## 2020-10-14 LAB — IRON AND TIBC
Iron: 84 ug/dL (ref 41–142)
Saturation Ratios: 29 % (ref 21–57)
TIBC: 293 ug/dL (ref 236–444)
UIBC: 209 ug/dL (ref 120–384)

## 2020-10-14 LAB — FERRITIN: Ferritin: 53 ng/mL (ref 11–307)

## 2020-10-14 NOTE — Telephone Encounter (Signed)
Called per Dr. Bertis Ruddy. Iron studies look good and scheduler will call to schedule appt in 3 months. Dr. Estanislado Pandy ordered Prolactin level and once resulted we will fax results to MD. She verbalized understanding.

## 2020-10-14 NOTE — Progress Notes (Signed)
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 and her history of gastric bypass surgery Today, she is not anemic Her MCV is low but could be due to her thalassemia trait or iron deficiency We will call her with results of iron studies If she does not need IV iron treatment, I will see her again in 3 months If she needs IV iron, we will proceed to schedule that She is in agreement  PCOS (polycystic ovarian syndrome) Her gynecologist requested prolactin level to be drawn We will go ahead and do the test   Rectal bleeding I have reviewed her colonoscopy report She will continue mesalamine as directed by GI  Obesity, Class III, BMI 40-49.9 (morbid obesity) (HCC) We discussed the importance of weight management for her health issues She is in agreement to be referred to medical weight management center.   Orders Placed This Encounter  Procedures  . Prolactin    Standing Status:   Future    Number of Occurrences:   1    Standing Expiration Date:   10/14/2021  . Amb Ref to Medical Weight Management    Referral Priority:   Routine    Referral Type:   Consultation    Number of Visits Requested:   1    The total time spent in the appointment was 20 minutes encounter with patients including review of chart and various tests results, discussions about plan of care and coordination of care plan   All questions were answered. The patient knows to call the clinic with any problems, questions or concerns. No barriers to learning was detected.    Artis Delay, MD 2/4/20229:44 AM  INTERVAL HISTORY: Karina [name] 40 y.o. female returns for further follow-up on chronic iron deficiency anemia I reviewed her colonoscopy report from last year She developed rectal bleeding and  subsequently underwent colonoscopy and biopsy which revealed colitis She was prescribed mesalamine but did not tolerate that well because of stomach issues She is concerned about her progressive weight gain despite her best effort  SUMMARY OF HEMATOLOGIC HISTORY:  Karina [name] is here because of severe iron deficiency She has diagnosis of thalassemia and history of bariatric surgery causing multiple mineral deficiencies: specifically, B12, iron, thiamine and vitamin D Her baseline blood work in July 2015 was normal. She had no recent labs since February 2017. She was found to have severe iron deficiency anemia Recently, she desired to become pregnant. She has a daughter, born in 16. Soon after that, she has an early trimester miscarriage She was evaluated by GYN and is currently on fertility treatment She was found to have abnormal CBC from recent blood work. She is recommended Iv iron therapy prior to becoming pregnant She complained of recent chest pain on exertion, shortness of breath on minimal exertion, pre-syncopal episodes, fatigue and palpitations. She had not noticed any recent bleeding such as epistaxis, hematuria or hematochezia. She had history of menorhagia The patient denies over the counter NSAID ingestion. She is not on antiplatelets agents. Her last colonoscopy was normal on 02/26/13. She had EGD on 08/28/12 which showed gastritis She had no prior history or diagnosis of cancer. Her age appropriate screening programs are up-to-date. 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 She never donated blood or received  blood transfusion The patient was prescribed oral iron supplements and she takes three daily but it caused severe constipation without success of improving her anemia She received 2 doses of intravenous iron in July 2017 with improvement of ferritin level She was lost to follow-up.  In August 2020, she received further  intravenous iron for recurrent iron deficiency In November 2021, she underwent colonoscopy and was diagnosed with colitis  I have reviewed the past medical history, past surgical history, social history and family history with the patient and they are unchanged from previous note.  ALLERGIES:  has No Known Allergies.  MEDICATIONS:  Current Outpatient Medications  Medication Sig Dispense Refill  . acetaminophen (TYLENOL) 500 MG tablet Take 500-1,000 mg by mouth every 6 (six) hours as needed for mild pain, moderate pain or headache.     . albuterol (PROVENTIL HFA;VENTOLIN HFA) 108 (90 BASE) MCG/ACT inhaler Inhale 1-2 puffs into the lungs every 6 (six) hours as needed for wheezing or shortness of breath.     . ALPRAZolam (XANAX) 0.5 MG tablet Take 0.5 mg by mouth 3 (three) times daily as needed for anxiety.    . betamethasone valerate ointment (VALISONE) 0.1 % Apply 1 application topically 2 (two) times daily as needed (skin irritation (applied to affected areas of hands)).     . fluconazole (DIFLUCAN) 150 MG tablet TAKE 1 TABLET BY MOUTH FOR 1 DOSE    . LIALDA 1.2 g EC tablet Take 2.4 g by mouth 2 (two) times daily.    . metFORMIN (GLUCOPHAGE) 1000 MG tablet metformin 1,000 mg tablet  Take 1 tablet(s) twice a day by oral route.    . Vitamin D, Ergocalciferol, (DRISDOL) 1.25 MG (50000 UNIT) CAPS capsule Take 50,000 Units by mouth every Sunday.     No current facility-administered medications for this visit.     REVIEW OF SYSTEMS:   Constitutional: Denies fevers, chills or night sweats Eyes: Denies blurriness of vision Ears, nose, mouth, throat, and face: Denies mucositis or sore throat Respiratory: Denies cough, dyspnea or wheezes Cardiovascular: Denies palpitation, chest discomfort or lower extremity swelling Gastrointestinal:  Denies nausea, heartburn or change in bowel habits Skin: Denies abnormal skin rashes Lymphatics: Denies new lymphadenopathy or easy bruising Neurological:Denies  numbness, tingling or new weaknesses Behavioral/Psych: Mood is stable, no new changes  All other systems were reviewed with the patient and are negative.  PHYSICAL EXAMINATION: ECOG PERFORMANCE STATUS: 1 - Symptomatic but completely ambulatory  Vitals:   10/14/20 0901  BP: (!) 102/57  Pulse: 77  Resp: 18  Temp: (!) 97.4 F (36.3 C)  SpO2: 96%   Filed Weights   10/14/20 0901  Weight: (!) 327 lb 6.4 oz (148.5 kg)    GENERAL:alert, no distress and comfortable NEURO: alert & oriented x 3 with fluent speech, no focal motor/sensory deficits  LABORATORY DATA:  I have reviewed the data as listed     Component Value Date/Time   NA 137 08/09/2017 1500   K 4.4 08/09/2017 1500   CL 103 08/09/2017 1500   CO2 29 08/09/2017 1500   GLUCOSE 94 08/09/2017 1500   BUN 12 08/09/2017 1500   CREATININE 0.73 08/09/2017 1500   CALCIUM 8.6 (L) 08/09/2017 1500   PROT 7.9 08/21/2012 1010   ALBUMIN 3.2 (L) 08/21/2012 1010   AST 30 08/21/2012 1010   ALT 41 (H) 08/21/2012 1010   ALKPHOS 131 (H) 08/21/2012 1010   BILITOT 0.5 08/21/2012 1010   GFRNONAA >60 08/09/2017 1500   GFRAA >60  08/09/2017 1500    No results found for: SPEP, UPEP  Lab Results  Component Value Date   WBC 9.0 10/14/2020   NEUTROABS 4.4 10/14/2020   HGB 12.0 10/14/2020   HCT 36.1 10/14/2020   MCV 73.7 (L) 10/14/2020   PLT 279 10/14/2020      Chemistry      Component Value Date/Time   NA 137 08/09/2017 1500   K 4.4 08/09/2017 1500   CL 103 08/09/2017 1500   CO2 29 08/09/2017 1500   BUN 12 08/09/2017 1500   CREATININE 0.73 08/09/2017 1500      Component Value Date/Time   CALCIUM 8.6 (L) 08/09/2017 1500   ALKPHOS 131 (H) 08/21/2012 1010   AST 30 08/21/2012 1010   ALT 41 (H) 08/21/2012 1010   BILITOT 0.5 08/21/2012 1010

## 2020-10-14 NOTE — Assessment & Plan Note (Signed)
Her gynecologist requested prolactin level to be drawn We will go ahead and do the test

## 2020-10-14 NOTE — Assessment & Plan Note (Signed)
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

## 2020-10-14 NOTE — Assessment & Plan Note (Signed)
We discussed the importance of weight management for her health issues She is in agreement to be referred to medical weight management center.

## 2020-10-14 NOTE — Telephone Encounter (Signed)
Scheduled follow-up appointment per 2/4 schedule message. Patient is aware. 

## 2020-10-14 NOTE — Assessment & Plan Note (Signed)
I have reviewed her colonoscopy report She will continue mesalamine as directed by GI

## 2020-10-15 LAB — PROLACTIN: Prolactin: 57.8 ng/mL — ABNORMAL HIGH (ref 4.8–23.3)

## 2020-10-17 ENCOUNTER — Telehealth: Payer: Self-pay | Admitting: *Deleted

## 2020-10-17 NOTE — Telephone Encounter (Signed)
Per Dr.Gorsuch, faxed Prolactin labs to [facility] [phone]. Confirmed receipt.

## 2020-10-26 DIAGNOSIS — E221 Hyperprolactinemia: Secondary | ICD-10-CM | POA: Diagnosis not present

## 2020-11-18 ENCOUNTER — Other Ambulatory Visit: Payer: Self-pay

## 2020-11-18 ENCOUNTER — Ambulatory Visit
Admission: RE | Admit: 2020-11-18 | Discharge: 2020-11-18 | Disposition: A | Discharge status: Home / Self Care | Payer: 59 | Source: Ambulatory Visit | Attending: Obstetrics and Gynecology | Admitting: Obstetrics and Gynecology

## 2020-11-18 DIAGNOSIS — Z1231 Encounter for screening mammogram for malignant neoplasm of breast: Secondary | ICD-10-CM

## 2020-12-01 ENCOUNTER — Other Ambulatory Visit: Payer: Self-pay

## 2020-12-01 ENCOUNTER — Other Ambulatory Visit: Payer: Self-pay | Admitting: Physician Assistant

## 2020-12-01 ENCOUNTER — Encounter: Payer: Self-pay | Admitting: Physician Assistant

## 2020-12-01 ENCOUNTER — Ambulatory Visit: Payer: 59 | Admitting: Physician Assistant

## 2020-12-01 DIAGNOSIS — L7211 Pilar cyst: Secondary | ICD-10-CM

## 2020-12-01 DIAGNOSIS — L309 Dermatitis, unspecified: Secondary | ICD-10-CM | POA: Diagnosis not present

## 2020-12-01 MED ORDER — ENSTILAR 0.005-0.064 % EX FOAM: 1.0000 "application " | Freq: Every day | CUTANEOUS | 0 refills | Status: DC

## 2020-12-01 MED ORDER — DOXYCYCLINE HYCLATE 100 MG PO TABS: 100.0000 mg | ORAL_TABLET | Freq: Two times a day (BID) | ORAL | 0 refills | Status: DC

## 2020-12-05 ENCOUNTER — Telehealth: Payer: Self-pay | Admitting: *Deleted

## 2020-12-05 ENCOUNTER — Encounter: Payer: Self-pay | Admitting: Physician Assistant

## 2020-12-05 NOTE — Telephone Encounter (Signed)
-----   Message from Glyn Ade, [place] sent at 12/05/2020 12:37 PM EDT ----- Positive for Staph. On doxycycline so it should be getting better. She can temporarily d/c the topical steroid for now. Check status please.

## 2020-12-05 NOTE — Telephone Encounter (Signed)
Left message for patient to return our phone call.

## 2020-12-05 NOTE — Progress Notes (Signed)
   New Patient   Subjective  Karina [name] is a 40 y.o. 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   The following portions of the chart were reviewed this encounter and updated as appropriate:  Tobacco  Allergies  Meds  Problems  Med Hx  Surg Hx  Fam Hx      Objective  Well appearing patient in no apparent distress; mood and affect are within normal limits.  All skin waist up examined.  Objective  Left Hand - palmar, Right Hand - palmar: erythema  Objective  Mid Parietal Scalp: Solitary, smooth skin colored to translucent papule.    Assessment & Plan  Dermatitis (2) Left Hand - palmar; Right Hand - palmar    Anaerobic and Aerobic Culture - Left Hand - palmar, Right Hand - palmar  doxycycline (VIBRA-TABS) 100 MG tablet - Left Hand - palmar, Right Hand - palmar  Calcipotriene-Betameth Diprop (ENSTILAR) 0.005-0.064 % FOAM - Left Hand - palmar, Right Hand - palmar  Pilar cyst Mid Parietal Scalp  30 min surgery   I, Venetta Knee, PA-C, have reviewed all documentation for this visit. The documentation on 12/05/20 for the exam, diagnosis, procedures, and orders are all accurate and complete.

## 2020-12-06 ENCOUNTER — Telehealth: Payer: Self-pay

## 2020-12-06 NOTE — Telephone Encounter (Signed)
Phone call from patient wanting her culture results.  Culture results and Mackey Birchwood, PA-C's recommendations given to patient. Patient stated that she has some improvement but her hands are still dry, cracked, and still getting the scaling.  Per Mackey Birchwood, PA have patient continue the lotion and the steroid.  Patient aware.

## 2020-12-06 NOTE — Telephone Encounter (Signed)
-----   Message from Kelli R Sheffield, PA-C sent at 12/05/2020 12:37 PM EDT ----- Positive for Staph. On doxycycline so it should be getting better. She can temporarily d/c the topical steroid for now. Check status please. 

## 2020-12-07 LAB — ANAEROBIC AND AEROBIC CULTURE
MICRO NUMBER:: 11687876
MICRO NUMBER:: 11687877
SPECIMEN QUALITY:: ADEQUATE
SPECIMEN QUALITY:: ADEQUATE

## 2020-12-09 DIAGNOSIS — E221 Hyperprolactinemia: Secondary | ICD-10-CM | POA: Diagnosis not present

## 2020-12-20 ENCOUNTER — Other Ambulatory Visit (HOSPITAL_COMMUNITY): Payer: Self-pay

## 2020-12-20 DIAGNOSIS — M62830 Muscle spasm of back: Secondary | ICD-10-CM | POA: Diagnosis not present

## 2020-12-20 DIAGNOSIS — M47819 Spondylosis without myelopathy or radiculopathy, site unspecified: Secondary | ICD-10-CM | POA: Diagnosis not present

## 2020-12-20 MED ORDER — TIZANIDINE HCL 2 MG PO CAPS
2.0000 mg | ORAL_CAPSULE | Freq: Three times a day (TID) | ORAL | 1 refills | Status: DC
  Filled 2020-12-20: qty 30, 5d supply, fill #0

## 2020-12-31 ENCOUNTER — Encounter (INDEPENDENT_AMBULATORY_CARE_PROVIDER_SITE_OTHER): Payer: Self-pay

## 2021-01-03 ENCOUNTER — Encounter: Payer: Self-pay | Admitting: Physician Assistant

## 2021-01-03 ENCOUNTER — Other Ambulatory Visit (HOSPITAL_COMMUNITY): Payer: Self-pay

## 2021-01-03 ENCOUNTER — Ambulatory Visit: Payer: 59 | Admitting: Physician Assistant

## 2021-01-03 ENCOUNTER — Other Ambulatory Visit: Payer: Self-pay

## 2021-01-03 DIAGNOSIS — L309 Dermatitis, unspecified: Secondary | ICD-10-CM

## 2021-01-03 MED ORDER — MUPIROCIN 2 % EX OINT
1.0000 "application " | TOPICAL_OINTMENT | Freq: Two times a day (BID) | CUTANEOUS | 6 refills | Status: DC
  Filled 2021-01-03: qty 22, 11d supply, fill #0
  Filled 2021-07-06: qty 22, 11d supply, fill #1
  Filled 2021-10-25: qty 22, 11d supply, fill #2

## 2021-01-04 ENCOUNTER — Other Ambulatory Visit (HOSPITAL_COMMUNITY): Payer: Self-pay

## 2021-01-04 ENCOUNTER — Telehealth: Payer: 59 | Admitting: Nurse Practitioner

## 2021-01-04 DIAGNOSIS — N898 Other specified noninflammatory disorders of vagina: Secondary | ICD-10-CM | POA: Diagnosis not present

## 2021-01-04 MED ORDER — FLUCONAZOLE 150 MG PO TABS
150.0000 mg | ORAL_TABLET | Freq: Once | ORAL | 0 refills | Status: AC
  Filled 2021-01-04: qty 1, 1d supply, fill #0

## 2021-01-04 MED FILL — Metformin HCl Tab 1000 MG: ORAL | 30 days supply | Qty: 60 | Fill #0 | Status: AC

## 2021-01-04 NOTE — Progress Notes (Signed)
We are sorry that you are not feeling well. Here is how we plan to help! Based on what you shared with me it looks like you: May have a yeast vaginosis  * you will have  To submit another evisit if  You think you have a UTI.  Vaginosis is an inflammation of the vagina that can result in discharge, itching and pain. The cause is usually a change in the normal balance of vaginal bacteria or an infection. Vaginosis can also result from reduced estrogen levels after menopause.  The most common causes of vaginosis are:   Bacterial vaginosis which results from an overgrowth of one on several organisms that are normally present in your vagina.   Yeast infections which are caused by a naturally occurring fungus called candida.   Vaginal atrophy (atrophic vaginosis) which results from the thinning of the vagina from reduced estrogen levels after menopause.   Trichomoniasis which is caused by a parasite and is commonly transmitted by sexual intercourse.  Factors that increase your risk of developing vaginosis include: [name] Medications, such as antibiotics and steroids . Uncontrolled diabetes . Use of hygiene products such as bubble bath, vaginal spray or vaginal deodorant . Douching . Wearing damp or tight-fitting clothing . Using an intrauterine device (IUD) for birth control . Hormonal changes, such as those associated with pregnancy, birth control pills or menopause . Sexual activity . Having a sexually transmitted infection  Your treatment plan is A single Diflucan (fluconazole) 150mg  tablet once.  I have electronically sent this prescription into the pharmacy that you have chosen.  Be sure to take all of the medication as directed. Stop taking any medication if you develop a rash, tongue swelling or shortness of breath. Mothers who are breast feeding should consider pumping and discarding their breast milk while on these antibiotics. However, there is no consensus that infant exposure at these  doses would be harmful.  Remember that medication creams can weaken latex condoms.   HOME CARE:  Good hygiene may prevent some types of vaginosis from recurring and may relieve some symptoms:  . Avoid baths, hot tubs and whirlpool spas. Rinse soap from your outer genital area after a shower, and dry the area well to prevent irritation. Don't use scented or harsh soaps, such as those with deodorant or antibacterial action. [name] Avoid irritants. These include scented tampons and pads. . Wipe from front to back after using the toilet. Doing so avoids spreading fecal bacteria to your vagina.  Other things that may help prevent vaginosis include:  [name] Don't douche. Your vagina doesn't require cleansing other than normal bathing. Repetitive douching disrupts the normal organisms that reside in the vagina and can actually increase your risk of vaginal infection. Douching won't clear up a vaginal infection. . Use a latex condom. Both female and female latex condoms may help you avoid infections spread by sexual contact. . Wear cotton underwear. Also wear pantyhose with a cotton crotch. If you feel comfortable without it, skip wearing underwear to bed. Yeast thrives in [name] Your symptoms should improve in the next day or two.  GET HELP RIGHT AWAY IF:  . You have pain in your lower abdomen ( pelvic area or over your ovaries) . You develop nausea or vomiting . You develop a fever . Your discharge changes or worsens . You have persistent pain with intercourse . You develop shortness of breath, a rapid pulse, or you faint.  These symptoms could be signs of problems  or infections that need to be evaluated by a medical provider now.  MAKE SURE YOU    Understand these instructions.  Will watch your condition.  Will get help right away if you are not doing well or get worse.  Your e-visit answers were reviewed by a board certified advanced clinical practitioner to complete your  personal care plan. Depending upon the condition, your plan could have included both over the counter or prescription medications. Please review your pharmacy choice to make sure that you have choses a pharmacy that is open for you to pick up any needed prescription, Your safety is important to [place]. If you have drug allergies check your prescription carefully.   You can use MyChart to ask questions about today's visit, request a non-urgent call back, or ask for a work or school excuse for 24 hours related to this e-Visit. If it has been greater than 24 hours you will need to follow up with your provider, or enter a new e-Visit to address those concerns. You will get a MyChart message within the next two days asking about your experience. I hope that your e-visit has been valuable and will speed your recovery.  5-10 minutes spent reviewing and documenting in chart.

## 2021-01-09 ENCOUNTER — Other Ambulatory Visit: Payer: Self-pay | Admitting: Hematology and Oncology

## 2021-01-09 ENCOUNTER — Other Ambulatory Visit (HOSPITAL_COMMUNITY): Payer: Self-pay

## 2021-01-09 DIAGNOSIS — D508 Other iron deficiency anemias: Secondary | ICD-10-CM

## 2021-01-12 ENCOUNTER — Other Ambulatory Visit: Payer: Self-pay

## 2021-01-12 ENCOUNTER — Encounter: Payer: Self-pay | Admitting: Hematology and Oncology

## 2021-01-12 ENCOUNTER — Inpatient Hospital Stay: Payer: 59

## 2021-01-12 ENCOUNTER — Inpatient Hospital Stay: Payer: 59 | Attending: Hematology and Oncology

## 2021-01-12 ENCOUNTER — Telehealth: Payer: Self-pay

## 2021-01-12 ENCOUNTER — Other Ambulatory Visit: Payer: Self-pay | Admitting: Hematology and Oncology

## 2021-01-12 ENCOUNTER — Inpatient Hospital Stay: Payer: 59 | Admitting: Hematology and Oncology

## 2021-01-12 VITALS — BP 109/56 | HR 58 | Temp 98.4°F | Resp 16

## 2021-01-12 DIAGNOSIS — D508 Other iron deficiency anemias: Secondary | ICD-10-CM

## 2021-01-12 DIAGNOSIS — Z79899 Other long term (current) drug therapy: Secondary | ICD-10-CM | POA: Insufficient documentation

## 2021-01-12 DIAGNOSIS — Z9884 Bariatric surgery status: Secondary | ICD-10-CM

## 2021-01-12 DIAGNOSIS — N920 Excessive and frequent menstruation with regular cycle: Secondary | ICD-10-CM | POA: Insufficient documentation

## 2021-01-12 DIAGNOSIS — K589 Irritable bowel syndrome without diarrhea: Secondary | ICD-10-CM | POA: Diagnosis not present

## 2021-01-12 DIAGNOSIS — D509 Iron deficiency anemia, unspecified: Secondary | ICD-10-CM | POA: Diagnosis not present

## 2021-01-12 LAB — IRON AND TIBC
Iron: 58 ug/dL (ref 28–170)
Saturation Ratios: 15 % (ref 10.4–31.8)
TIBC: 378 ug/dL (ref 250–450)
UIBC: 320 ug/dL

## 2021-01-12 LAB — CBC WITH DIFFERENTIAL/PLATELET
Abs Immature Granulocytes: 0.03 10*3/uL (ref 0.00–0.07)
Basophils Absolute: 0.1 10*3/uL (ref 0.0–0.1)
Basophils Relative: 1 %
Eosinophils Absolute: 0.4 10*3/uL (ref 0.0–0.5)
Eosinophils Relative: 4 %
HCT: 34.1 % — ABNORMAL LOW (ref 36.0–46.0)
Hemoglobin: 11.5 g/dL — ABNORMAL LOW (ref 12.0–15.0)
Immature Granulocytes: 0 %
Lymphocytes Relative: 35 %
Lymphs Abs: 3.2 10*3/uL (ref 0.7–4.0)
MCH: 24.5 pg — ABNORMAL LOW (ref 26.0–34.0)
MCHC: 33.7 g/dL (ref 30.0–36.0)
MCV: 72.7 fL — ABNORMAL LOW (ref 80.0–100.0)
Monocytes Absolute: 0.7 10*3/uL (ref 0.1–1.0)
Monocytes Relative: 8 %
Neutro Abs: 4.8 10*3/uL (ref 1.7–7.7)
Neutrophils Relative %: 52 %
Platelets: 269 10*3/uL (ref 150–400)
RBC: 4.69 MIL/uL (ref 3.87–5.11)
RDW: 14.9 % (ref 11.5–15.5)
WBC: 9.2 10*3/uL (ref 4.0–10.5)
nRBC: 0 % (ref 0.0–0.2)

## 2021-01-12 LAB — FERRITIN: Ferritin: 17 ng/mL (ref 11–307)

## 2021-01-12 MED ORDER — SODIUM CHLORIDE 0.9 % IV SOLN
200.0000 mg | Freq: Once | INTRAVENOUS | Status: AC
  Administered 2021-01-12: 200 mg via INTRAVENOUS
  Filled 2021-01-12: qty 200

## 2021-01-12 MED ORDER — SODIUM CHLORIDE 0.9 % IV SOLN
Freq: Once | INTRAVENOUS | Status: AC
Start: 2021-01-12 — End: 2021-01-12
  Filled 2021-01-12: qty 250

## 2021-01-12 NOTE — Assessment & Plan Note (Signed)
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

## 2021-01-12 NOTE — Telephone Encounter (Signed)
-----   Message from Artis Delay, MD sent at 01/12/2021 11:05 AM EDT ----- Pls call and let her know iron studies are low Plan for 2 doses IV iron as discussed

## 2021-01-12 NOTE — Assessment & Plan Note (Signed)
She continues to have significant menorrhagia with passage of clots every month We discussed the importance of getting her menorrhagia under control The patient does not desire further pregnancies I recommend her to see a gynecologist and consider IUD or endometrial ablation

## 2021-01-12 NOTE — Telephone Encounter (Signed)
VM left requesting pt to call back

## 2021-01-12 NOTE — Progress Notes (Signed)
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 and her history of gastric bypass surgery, along with menorrhagia She is anemic today and is symptomatic We will proceed with IV iron infusion I plan to see her again in 3 months for further follow-up  Menorrhagia with regular cycle She continues to have significant menorrhagia with passage of clots every month We discussed the importance of getting her menorrhagia under control The patient does not desire further pregnancies I recommend her to see a gynecologist and consider IUD or endometrial ablation  H/O laparoscopic adjustable gastric banding Due to her weight loss surgery, 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   The total time spent in the appointment was 20 minutes encounter with patients including review of chart and various tests results, discussions about plan of care and coordination of care plan   All questions were answered. The patient knows to call the clinic with any problems, questions or concerns. No barriers to learning was detected.    Artis Delay, MD 5/5/202210:11 AM  INTERVAL HISTORY: Karina [name] 40 y.o. female returns for further follow-up for chronic iron deficiency anemia and menorrhagia She returns for further follow-up She complained of fatigue Since last time I saw her, she have heavy menstruation lasting 5 to 7 days with passage of clots She has not follow-up with her gynecologist since her recent miscarriage Last GI follow-up was several months ago.  In November 2021, she had colitis but she is not taking mesalamine She  denies recent diarrhea or passage of bright red blood per rectum  SUMMARY OF HEMATOLOGIC HISTORY:  Karina [name] is here because of severe iron deficiency She has diagnosis of thalassemia and history of bariatric surgery causing multiple mineral deficiencies: specifically, B12, iron, thiamine and vitamin D Her baseline blood work in July 2015 was normal. She had no recent labs since February 2017. She was found to have severe iron deficiency anemia Recently, she desired to become pregnant. She has a daughter, born in 51. Soon after that, she has an early trimester miscarriage She was evaluated by GYN and is currently on fertility treatment She was found to have abnormal CBC from recent blood work. She is recommended Iv iron therapy prior to becoming pregnant She complained of recent chest pain on exertion, shortness of breath on minimal exertion, pre-syncopal episodes, fatigue and palpitations. She had not noticed any recent bleeding such as epistaxis, hematuria or hematochezia. She had history of menorhagia The patient denies over the counter NSAID ingestion. She is not on antiplatelets agents. Her last colonoscopy was normal on 02/26/13. She had EGD on 08/28/12 which showed gastritis She had no prior history or diagnosis of cancer. Her age appropriate screening programs are up-to-date. 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 She never donated blood or received blood transfusion The patient was prescribed oral iron supplements and she takes three daily but it caused severe constipation without success of improving her anemia She received 2 doses of intravenous iron in July 2017 with improvement of ferritin level She was lost to follow-up.  In August 2020,  she received further intravenous iron for recurrent iron deficiency In November 2021, she underwent colonoscopy and was diagnosed with colitis  I have reviewed the past medical history, past  surgical history, social history and family history with the patient and they are unchanged from previous note.  ALLERGIES:  has No Known Allergies.  MEDICATIONS:  Current Outpatient Medications  Medication Sig Dispense Refill  . albuterol (PROVENTIL HFA;VENTOLIN HFA) 108 (90 BASE) MCG/ACT inhaler Inhale 1-2 puffs into the lungs every 6 (six) hours as needed for wheezing or shortness of breath.     . ALPRAZolam (XANAX) 0.5 MG tablet Take 0.5 mg by mouth 3 (three) times daily as needed for anxiety.    . betamethasone valerate ointment (VALISONE) 0.1 % Apply 1 application topically 2 (two) times daily as needed (skin irritation (applied to affected areas of hands)).     . cabergoline (DOSTINEX) 0.5 MG tablet TAKE 1/2 TABLET BY MOUTH 2 TIMES WEEKLY 8 tablet 6  . Calcipotriene-Betameth Diprop (ENSTILAR) 0.005-0.064 % FOAM Apply 1 application topically daily. 60 g 0  . metFORMIN (GLUCOPHAGE) 1000 MG tablet TAKE 1 TABLET BY MOUTH TWICE DAILY. 60 tablet 3  . mupirocin ointment (BACTROBAN) 2 % Apply 1 application topically 2 (two) times daily. 22 g 6  . tizanidine (ZANAFLEX) 2 MG capsule Take 1-2 capsules (2-4 mg total) by mouth 3 (three) times daily as needed for Muscle spasms. 30 capsule 1  . VENTOLIN HFA 108 (90 Base) MCG/ACT inhaler INHALE 2 PUFFS BY MOUTH INTO THE LUNGS EVERY 6 HOURS AS NEEDED. BRAND NAME ONLY 18 g 0  . Vitamin D, Ergocalciferol, (DRISDOL) 1.25 MG (50000 UNIT) CAPS capsule Take 50,000 Units by mouth every Sunday.     No current facility-administered medications for this visit.     REVIEW OF SYSTEMS:   Constitutional: Denies fevers, chills or night sweats Eyes: Denies blurriness of vision Ears, nose, mouth, throat, and face: Denies mucositis or sore throat Respiratory: Denies cough, dyspnea or wheezes Cardiovascular: Denies palpitation, chest discomfort or lower extremity swelling Gastrointestinal:  Denies nausea, heartburn or change in bowel habits Skin: Denies abnormal skin  rashes Lymphatics: Denies new lymphadenopathy or easy bruising Neurological:Denies numbness, tingling or new weaknesses Behavioral/Psych: Mood is stable, no new changes  All other systems were reviewed with the patient and are negative.  PHYSICAL EXAMINATION: ECOG PERFORMANCE STATUS: 1 - Symptomatic but completely ambulatory  Vitals:   01/12/21 0816  BP: 120/76  Pulse: 79  Resp: 18  Temp: (!) 97.4 F (36.3 C)  SpO2: 99%   Filed Weights   01/12/21 0816  Weight: (!) 323 lb (146.5 kg)    GENERAL:alert, no distress and comfortable NEURO: alert & oriented x 3 with fluent speech, no focal motor/sensory deficits  LABORATORY DATA:  I have reviewed the data as listed     Component Value Date/Time   NA 137 08/09/2017 1500   K 4.4 08/09/2017 1500   CL 103 08/09/2017 1500   CO2 29 08/09/2017 1500   GLUCOSE 94 08/09/2017 1500   BUN 12 08/09/2017 1500   CREATININE 0.73 08/09/2017 1500   CALCIUM 8.6 (L) 08/09/2017 1500   PROT 7.9 08/21/2012 1010   ALBUMIN 3.2 (L) 08/21/2012 1010   AST 30 08/21/2012 1010   ALT 41 (H) 08/21/2012 1010   ALKPHOS 131 (H) 08/21/2012 1010   BILITOT 0.5 08/21/2012 1010   GFRNONAA >60 08/09/2017 1500   GFRAA >60 08/09/2017 1500    No results found for: SPEP, UPEP  Lab Results  Component Value Date   WBC 9.2 01/12/2021   NEUTROABS 4.8 01/12/2021   HGB 11.5 (L) 01/12/2021   HCT 34.1 (L) 01/12/2021   MCV 72.7 (L) 01/12/2021   PLT 269 01/12/2021      Chemistry      Component Value Date/Time   NA 137 08/09/2017 1500   K 4.4 08/09/2017 1500   CL 103 08/09/2017 1500   CO2 29 08/09/2017 1500   BUN 12 08/09/2017 1500   CREATININE 0.73 08/09/2017 1500      Component Value Date/Time   CALCIUM 8.6 (L) 08/09/2017 1500   ALKPHOS 131 (H) 08/21/2012 1010   AST 30 08/21/2012 1010   ALT 41 (H) 08/21/2012 1010   BILITOT 0.5 08/21/2012 1010

## 2021-01-12 NOTE — Patient Instructions (Signed)

## 2021-01-12 NOTE — Assessment & Plan Note (Signed)
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

## 2021-01-12 NOTE — Telephone Encounter (Signed)
Pt called back, message relayed per MD Bertis Ruddy. Pt verbalizes understanding

## 2021-01-17 ENCOUNTER — Telehealth: Payer: Self-pay | Admitting: Hematology and Oncology

## 2021-01-17 NOTE — Telephone Encounter (Signed)
sch per 5/5 los, left message

## 2021-01-20 ENCOUNTER — Inpatient Hospital Stay: Payer: 59

## 2021-01-20 ENCOUNTER — Other Ambulatory Visit: Payer: Self-pay

## 2021-01-20 VITALS — BP 119/59 | HR 85 | Temp 98.0°F | Resp 18

## 2021-01-20 DIAGNOSIS — Z9884 Bariatric surgery status: Secondary | ICD-10-CM | POA: Diagnosis not present

## 2021-01-20 DIAGNOSIS — D508 Other iron deficiency anemias: Secondary | ICD-10-CM

## 2021-01-20 DIAGNOSIS — Z79899 Other long term (current) drug therapy: Secondary | ICD-10-CM | POA: Diagnosis not present

## 2021-01-20 DIAGNOSIS — K589 Irritable bowel syndrome without diarrhea: Secondary | ICD-10-CM | POA: Diagnosis not present

## 2021-01-20 DIAGNOSIS — N920 Excessive and frequent menstruation with regular cycle: Secondary | ICD-10-CM | POA: Diagnosis not present

## 2021-01-20 DIAGNOSIS — D509 Iron deficiency anemia, unspecified: Secondary | ICD-10-CM | POA: Diagnosis not present

## 2021-01-20 MED ORDER — SODIUM CHLORIDE 0.9 % IV SOLN
200.0000 mg | Freq: Once | INTRAVENOUS | Status: AC
  Administered 2021-01-20: 200 mg via INTRAVENOUS
  Filled 2021-01-20: qty 200

## 2021-01-20 MED ORDER — SODIUM CHLORIDE 0.9 % IV SOLN
Freq: Once | INTRAVENOUS | Status: AC
  Filled 2021-01-20: qty 250

## 2021-01-20 NOTE — Progress Notes (Signed)
Patient was observed for 30 minutes post iron infusion with no adverse reactions. Upon discharge, vitals stable and patient in no distress.

## 2021-01-20 NOTE — Patient Instructions (Signed)

## 2021-01-27 ENCOUNTER — Inpatient Hospital Stay: Payer: 59

## 2021-01-27 ENCOUNTER — Other Ambulatory Visit: Payer: Self-pay

## 2021-01-27 ENCOUNTER — Encounter: Payer: Self-pay | Admitting: Physician Assistant

## 2021-01-27 VITALS — BP 123/66 | HR 70 | Temp 98.5°F | Resp 18

## 2021-01-27 DIAGNOSIS — Z9884 Bariatric surgery status: Secondary | ICD-10-CM | POA: Diagnosis not present

## 2021-01-27 DIAGNOSIS — D508 Other iron deficiency anemias: Secondary | ICD-10-CM

## 2021-01-27 DIAGNOSIS — D509 Iron deficiency anemia, unspecified: Secondary | ICD-10-CM | POA: Diagnosis not present

## 2021-01-27 DIAGNOSIS — N920 Excessive and frequent menstruation with regular cycle: Secondary | ICD-10-CM | POA: Diagnosis not present

## 2021-01-27 DIAGNOSIS — Z79899 Other long term (current) drug therapy: Secondary | ICD-10-CM | POA: Diagnosis not present

## 2021-01-27 DIAGNOSIS — K589 Irritable bowel syndrome without diarrhea: Secondary | ICD-10-CM | POA: Diagnosis not present

## 2021-01-27 MED ORDER — IRON SUCROSE 20 MG/ML IV SOLN
200.0000 mg | Freq: Once | INTRAVENOUS | Status: AC
  Administered 2021-01-27: 200 mg via INTRAVENOUS
  Filled 2021-01-27: qty 200

## 2021-01-27 MED ORDER — SODIUM CHLORIDE 0.9 % IV SOLN
Freq: Once | INTRAVENOUS | Status: AC
  Filled 2021-01-27: qty 250

## 2021-01-27 NOTE — Progress Notes (Signed)
   Follow-Up Visit   Subjective  Karina [name] is a 40 y.o. female who presents for the following: Follow-up (On hands- "better"- tx- doxy & enstilar).   The following portions of the chart were reviewed this encounter and updated as appropriate:      Objective  Well appearing patient in no apparent distress; mood and affect are within normal limits.  A focused examination was performed including hands. Relevant physical exam findings are noted in the Assessment and Plan.  Objective  Right Dorsal Hand: Xerosis - good improvement   Assessment & Plan  Dermatitis Right Dorsal Hand  mupirocin ointment (BACTROBAN) 2 % - Right Dorsal Hand  Other Related Medications Calcipotriene-Betameth Diprop (ENSTILAR) 0.005-0.064 % FOAM    I, Bronwen Pendergraft, PA-C, have reviewed all documentation's for this visit.  The documentation on 01/27/21 for the exam, diagnosis, procedures and orders are all accurate and complete.

## 2021-01-27 NOTE — Patient Instructions (Signed)

## 2021-02-01 ENCOUNTER — Telehealth: Payer: 59 | Admitting: Physician Assistant

## 2021-02-01 ENCOUNTER — Other Ambulatory Visit (HOSPITAL_COMMUNITY): Payer: Self-pay

## 2021-02-01 DIAGNOSIS — H109 Unspecified conjunctivitis: Secondary | ICD-10-CM | POA: Diagnosis not present

## 2021-02-01 MED ORDER — POLYMYXIN B-TRIMETHOPRIM 10000-0.1 UNIT/ML-% OP SOLN
1.0000 [drp] | Freq: Four times a day (QID) | OPHTHALMIC | 0 refills | Status: AC
  Filled 2021-02-01: qty 10, 13d supply, fill #0

## 2021-02-01 NOTE — Progress Notes (Signed)
I have spent 5 minutes in review of e-visit questionnaire, review and updating patient chart, medical decision making and response to patient.   Noreta Kue Cody Nidia Grogan, PA-C    

## 2021-02-01 NOTE — Progress Notes (Signed)
E-Visit for Pink Eye   We are sorry that you are not feeling well.  Here is how we plan to help!  Based on what you have shared with me it looks like you have conjunctivitis.  Conjunctivitis is a common inflammatory or infectious condition of the eye that is often referred to as "pink eye".  In most cases it is contagious (viral or bacterial). However, not all conjunctivitis requires antibiotics (ex. Allergic).  We have made appropriate suggestions for you based upon your presentation.  I have prescribed Polytrim Ophthalmic drops 1-2 drops 4 times a day times 5 days  Pink eye can be highly contagious.  It is typically spread through direct contact with secretions, or contaminated objects or surfaces that one may have touched.  Strict handwashing is suggested with soap and water is urged.  If not available, use alcohol based had sanitizer.  Avoid unnecessary touching of the eye.  If you wear contact lenses, you will need to refrain from wearing them until you see no white discharge from the eye for at least 24 hours after being on medication.  You should see symptom improvement in 1-2 days after starting the medication regimen.  Call us if symptoms are not improved in 1-2 days.  Home Care: Wash your hands often! Do not wear your contacts until you complete your treatment plan. Avoid sharing towels, bed linen, personal items with a person who has pink eye. See attention for anyone in your home with similar symptoms.  Get Help Right Away If: Your symptoms do not improve. You develop blurred or loss of vision. Your symptoms worsen (increased discharge, pain or redness)  Your e-visit answers were reviewed by a board certified advanced clinical practitioner to complete your personal care plan.  Depending on the condition, your plan could have included both over the counter or prescription medications.  If there is a problem please reply  once you have received a response from your provider.  Your  safety is important to us.  If you have drug allergies check your prescription carefully.    You can use MyChart to ask questions about today's visit, request a non-urgent call back, or ask for a work or school excuse for 24 hours related to this e-Visit. If it has been greater than 24 hours you will need to follow up with your provider, or enter a new e-Visit to address those concerns.   You will get an e-mail in the next two days asking about your experience.  I hope that your e-visit has been valuable and will speed your recovery. Thank you for using e-visits.     

## 2021-02-01 NOTE — Progress Notes (Signed)
Message sent to patient requesting further input regarding current symptoms. Awaiting patient response.  

## 2021-02-02 ENCOUNTER — Other Ambulatory Visit (HOSPITAL_COMMUNITY): Payer: Self-pay

## 2021-02-03 DIAGNOSIS — M2578 Osteophyte, vertebrae: Secondary | ICD-10-CM | POA: Diagnosis not present

## 2021-02-03 DIAGNOSIS — M47814 Spondylosis without myelopathy or radiculopathy, thoracic region: Secondary | ICD-10-CM | POA: Diagnosis not present

## 2021-02-03 DIAGNOSIS — M419 Scoliosis, unspecified: Secondary | ICD-10-CM | POA: Diagnosis not present

## 2021-02-03 DIAGNOSIS — M545 Low back pain, unspecified: Secondary | ICD-10-CM | POA: Diagnosis not present

## 2021-02-03 DIAGNOSIS — M47816 Spondylosis without myelopathy or radiculopathy, lumbar region: Secondary | ICD-10-CM | POA: Diagnosis not present

## 2021-02-03 DIAGNOSIS — M47812 Spondylosis without myelopathy or radiculopathy, cervical region: Secondary | ICD-10-CM | POA: Diagnosis not present

## 2021-02-14 ENCOUNTER — Telehealth: Payer: 59 | Admitting: Nurse Practitioner

## 2021-02-14 ENCOUNTER — Other Ambulatory Visit (HOSPITAL_COMMUNITY): Payer: Self-pay

## 2021-02-14 DIAGNOSIS — R141 Gas pain: Secondary | ICD-10-CM | POA: Insufficient documentation

## 2021-02-14 DIAGNOSIS — R142 Eructation: Secondary | ICD-10-CM | POA: Insufficient documentation

## 2021-02-14 DIAGNOSIS — B259 Cytomegaloviral disease, unspecified: Secondary | ICD-10-CM | POA: Insufficient documentation

## 2021-02-14 DIAGNOSIS — K648 Other hemorrhoids: Secondary | ICD-10-CM | POA: Insufficient documentation

## 2021-02-14 DIAGNOSIS — J324 Chronic pansinusitis: Secondary | ICD-10-CM | POA: Diagnosis not present

## 2021-02-14 DIAGNOSIS — K5900 Constipation, unspecified: Secondary | ICD-10-CM | POA: Insufficient documentation

## 2021-02-14 DIAGNOSIS — R7302 Impaired glucose tolerance (oral): Secondary | ICD-10-CM | POA: Insufficient documentation

## 2021-02-14 DIAGNOSIS — R432 Parageusia: Secondary | ICD-10-CM | POA: Insufficient documentation

## 2021-02-14 DIAGNOSIS — Z8371 Family history of colonic polyps: Secondary | ICD-10-CM | POA: Insufficient documentation

## 2021-02-14 DIAGNOSIS — N979 Female infertility, unspecified: Secondary | ICD-10-CM | POA: Insufficient documentation

## 2021-02-14 DIAGNOSIS — R1310 Dysphagia, unspecified: Secondary | ICD-10-CM | POA: Insufficient documentation

## 2021-02-14 DIAGNOSIS — A0839 Other viral enteritis: Secondary | ICD-10-CM | POA: Insufficient documentation

## 2021-02-14 MED ORDER — DOXYCYCLINE HYCLATE 100 MG PO TABS
100.0000 mg | ORAL_TABLET | Freq: Two times a day (BID) | ORAL | 0 refills | Status: AC
  Filled 2021-02-14: qty 14, 7d supply, fill #0

## 2021-02-14 NOTE — Progress Notes (Signed)
We are sorry that you are not feeling well.  Here is how we plan to help!  Based on what you have shared with me it looks like you have sinusitis.  Sinusitis is inflammation and infection in the sinus cavities of the head.  Based on your presentation I believe you most likely have Acute Bacterial Sinusitis.  This is an infection caused by bacteria and is treated with antibiotics. I have prescribed Doxycycline 100mg  by mouth twice a day for 10 days. You may use an oral decongestant such as Mucinex D or if you have glaucoma or high blood pressure use plain Mucinex. Saline nasal spray help and can safely be used as often as needed for congestion.  If you develop worsening sinus pain, fever or notice severe headache and vision changes, or if symptoms are not better after completion of antibiotic, please schedule an appointment with a health care provider.    Sinus infections are not as easily transmitted as other respiratory infection, however we still recommend that you avoid close contact with loved ones, especially the very young and elderly.  Remember to wash your hands thoroughly throughout the day as this is the number one way to prevent the spread of infection!  Home Care:  Only take medications as instructed by your medical team.  Complete the entire course of an antibiotic.  Do not take these medications with alcohol.  A steam or ultrasonic humidifier can help congestion.  You can place a towel over your head and breathe in the steam from hot water coming from a faucet.  Avoid close contacts especially the very young and the elderly.  Cover your mouth when you cough or sneeze.  Always remember to wash your hands.  Get Help Right Away If:  You develop worsening fever or sinus pain.  You develop a severe head ache or visual changes.  Your symptoms persist after you have completed your treatment plan.  Make sure you  Understand these instructions.  Will watch your  condition.  Will get help right away if you are not doing well or get worse.  Your e-visit answers were reviewed by a board certified advanced clinical practitioner to complete your personal care plan.  Depending on the condition, your plan could have included both over the counter or prescription medications.  If there is a problem please reply  once you have received a response from your provider.  Your safety is important to .  If you have drug allergies check your prescription carefully.    You can use MyChart to ask questions about today's visit, request a non-urgent call back, or ask for a work or school excuse for 24 hours related to this e-Visit. If it has been greater than 24 hours you will need to follow up with your provider, or enter a new e-Visit to address those concerns.  You will get an e-mail in the next two days asking about your experience.  I hope that your e-visit has been valuable and will speed your recovery. Thank you for using e-visits.  I spent approximately 5 minutes reviewing the patient's chart, symptoms and coordinating her care.   Meds ordered this encounter  Medications  . doxycycline (VIBRA-TABS) 100 MG tablet    Sig: Take 1 tablet (100 mg total) by mouth 2 (two) times daily for 7 days.    Dispense:  14 tablet    Refill:  0

## 2021-02-17 ENCOUNTER — Telehealth: Payer: 59 | Admitting: Nurse Practitioner

## 2021-02-17 DIAGNOSIS — Z20822 Contact with and (suspected) exposure to covid-19: Secondary | ICD-10-CM

## 2021-02-17 DIAGNOSIS — R059 Cough, unspecified: Secondary | ICD-10-CM

## 2021-02-17 DIAGNOSIS — J029 Acute pharyngitis, unspecified: Secondary | ICD-10-CM

## 2021-02-17 MED ORDER — NAPROXEN 500 MG PO TABS: 500.0000 mg | ORAL_TABLET | Freq: Two times a day (BID) | ORAL | 0 refills | Status: DC

## 2021-02-17 MED ORDER — BENZONATATE 100 MG PO CAPS: 100.0000 mg | ORAL_CAPSULE | Freq: Three times a day (TID) | ORAL | 0 refills | Status: DC | PRN

## 2021-02-17 NOTE — Progress Notes (Signed)
E-Visit for Corona Virus Screening  Your current symptoms could be consistent with the coronavirus.  Many health care providers can now test patients at their office but not all are.  [facility] has multiple testing sites. For information on our COVID testing locations and hours go to [email or web address]  We are enrolling you in our MyChart Home Monitoring for COVID19 . Daily you will receive a questionnaire within the MyChart website. Our COVID 19 response team will be monitoring your responses daily.  Testing Information: The COVID-19 Community Testing sites are testing BY APPOINTMENT ONLY.  You can schedule online at [email or web address]  If you do not have access to a smart phone or computer you may call [phone] for an appointment.   Additional testing sites in the Community:  For [facility] Testing sites in [place]  [email or web address]  For Pop-up testing sites in [place]  [email or web address]  For [email or web address]  For [facility] testing in [place] and [email or web address]  For [facility] testing in [place]   [email or web address]  For  more information about community testing call [phone]   Please quarantine yourself while awaiting your test results. Please stay home for a minimum of 10 days from the first day of illness with improving symptoms and you have had 24 hours of no fever (without the use of Tylenol (Acetaminophen) Motrin (Ibuprofen) or any fever reducing medication).  Also - Do not get tested prior to returning to work because once you have had a positive test the test can stay positive for  more than a month in some cases.   You should wear a mask or cloth face covering over your nose and mouth if you must be around other people or animals, including pets (even at home). Try to stay at least 6 feet away from other people. This will protect the people around you.  Please continue good preventive care measures, including:  frequent hand-washing, avoid touching your face, cover coughs/sneezes, stay out of crowds and keep a 6 foot distance from others.  COVID-19 is a respiratory illness with symptoms that are similar to the flu. Symptoms are typically mild to moderate, but there have been cases of severe illness and death due to the virus.   The following symptoms may appear 2-14 days after exposure: Fever Cough Shortness of breath or difficulty breathing Chills Repeated shaking with chills Muscle pain Headache Sore throat New loss of taste or smell Fatigue Congestion or runny nose Nausea or vomiting Diarrhea  Go to the nearest hospital ED for assessment if fever/cough/breathlessness are severe or illness seems like a threat to life.  It is vitally important that if you feel that you have an infection such as this virus or any other virus that you stay home and away from places where you may spread it to others.  You should avoid contact with people age 26 and older.   You can use medication such as prescription cough medication called Tessalon Perles 100 mg. You may take 1-2 capsules every 8 hours as needed for cough and prescription anti-inflammatory called Naprosyn 500 mg. Take twice daily as needed for fever or body aches for 2 weeks  You may also take acetaminophen (Tylenol) as needed for fever.  Reduce your risk of any infection by using the same precautions used for avoiding the common cold or flu:  Wash your hands often with soap and warm water for at least 20 seconds.  If soap and water are not  readily available, use an alcohol-based hand sanitizer with at least 60%  alcohol.  If coughing or sneezing, cover your mouth and nose by coughing or sneezing into the elbow areas of your shirt or coat, into a tissue or into your sleeve (not your hands). Avoid shaking hands with others and consider head nods or verbal greetings only. Avoid touching your eyes, nose, or mouth with unwashed hands.  Avoid close contact with people who are sick. Avoid places or events with large numbers of people in one location, like concerts or sporting events. Carefully consider travel plans you have or are making. If you are planning any travel outside or inside the [place], visit the CDC's Travelers' Health webpage for the latest health notices. If you have some symptoms but not all symptoms, continue to monitor at home and seek medical attention if your symptoms worsen. If you are having a medical emergency, call 911.  HOME CARE Only take medications as instructed by your medical team. Drink plenty of fluids and get plenty of rest. A steam or ultrasonic humidifier can help if you have congestion.   GET HELP RIGHT AWAY IF YOU HAVE EMERGENCY WARNING SIGNS** FOR COVID-19. If you or someone is showing any of these signs seek emergency medical care immediately. Call 911 or proceed to your closest emergency facility if: You develop worsening high fever. Trouble breathing Bluish lips or face Persistent pain or pressure in the chest New confusion Inability to wake or stay awake You cough up blood. Your symptoms become more severe  **This list is not all possible symptoms. Contact your medical provider for any symptoms that are sever or concerning to you.  MAKE SURE YOU  Understand these instructions. Will watch your condition. Will get help right away if you are not doing well or get worse.  Your e-visit answers were reviewed by a board certified advanced clinical practitioner to complete your personal care plan.  Depending on the condition, your plan could have included both over the  counter or prescription medications.  If there is a problem please reply once you have received a response from your provider.  Your safety is important to [place].  If you have drug allergies check your prescription carefully.    You can use MyChart to ask questions about today's visit, request a non-urgent call back, or ask for a work or school excuse for 24 hours related to this e-Visit. If it has been greater than 24 hours you will need to follow up with your provider, or enter a new e-Visit to address those concerns. You will get an e-mail in the next two days asking about your experience.  I hope that your e-visit has been valuable and will speed your recovery. Thank you for using e-visits.  5-10 minutes spent reviewing and documenting in chart.

## 2021-03-14 ENCOUNTER — Other Ambulatory Visit (HOSPITAL_COMMUNITY): Payer: Self-pay

## 2021-03-14 DIAGNOSIS — Z0000 Encounter for general adult medical examination without abnormal findings: Secondary | ICD-10-CM | POA: Diagnosis not present

## 2021-03-14 MED ORDER — FUROSEMIDE 20 MG PO TABS
20.0000 mg | ORAL_TABLET | Freq: Every day | ORAL | 1 refills | Status: DC | PRN
  Filled 2021-03-14: qty 30, 30d supply, fill #0

## 2021-03-14 MED ORDER — VENTOLIN HFA 108 (90 BASE) MCG/ACT IN AERS
INHALATION_SPRAY | RESPIRATORY_TRACT | 11 refills | Status: DC
  Filled 2021-03-14: qty 18, 25d supply, fill #0
  Filled 2021-03-15: qty 18, 17d supply, fill #0
  Filled 2021-10-25: qty 18, 17d supply, fill #1

## 2021-03-14 MED ORDER — ALPRAZOLAM 0.5 MG PO TABS
0.5000 mg | ORAL_TABLET | Freq: Every day | ORAL | 0 refills | Status: DC | PRN
  Filled 2021-03-14: qty 30, 30d supply, fill #0

## 2021-03-15 ENCOUNTER — Other Ambulatory Visit (HOSPITAL_COMMUNITY): Payer: Self-pay

## 2021-03-15 ENCOUNTER — Encounter: Payer: Self-pay | Admitting: Hematology and Oncology

## 2021-03-21 ENCOUNTER — Other Ambulatory Visit (HOSPITAL_COMMUNITY): Payer: Self-pay

## 2021-03-21 MED ORDER — ERGOCALCIFEROL 1.25 MG (50000 UT) PO CAPS
50000.0000 [IU] | ORAL_CAPSULE | ORAL | 0 refills | Status: DC
  Filled 2021-03-21: qty 12, 84d supply, fill #0

## 2021-04-13 ENCOUNTER — Inpatient Hospital Stay: Payer: 59

## 2021-04-13 ENCOUNTER — Other Ambulatory Visit: Payer: Self-pay

## 2021-04-13 ENCOUNTER — Encounter: Payer: Self-pay | Admitting: Hematology and Oncology

## 2021-04-13 ENCOUNTER — Inpatient Hospital Stay: Payer: 59 | Attending: Hematology and Oncology | Admitting: Hematology and Oncology

## 2021-04-13 DIAGNOSIS — Z79899 Other long term (current) drug therapy: Secondary | ICD-10-CM | POA: Diagnosis not present

## 2021-04-13 DIAGNOSIS — K589 Irritable bowel syndrome without diarrhea: Secondary | ICD-10-CM | POA: Diagnosis not present

## 2021-04-13 DIAGNOSIS — E538 Deficiency of other specified B group vitamins: Secondary | ICD-10-CM | POA: Insufficient documentation

## 2021-04-13 DIAGNOSIS — K909 Intestinal malabsorption, unspecified: Secondary | ICD-10-CM | POA: Diagnosis not present

## 2021-04-13 DIAGNOSIS — D509 Iron deficiency anemia, unspecified: Secondary | ICD-10-CM | POA: Diagnosis not present

## 2021-04-13 DIAGNOSIS — Z7984 Long term (current) use of oral hypoglycemic drugs: Secondary | ICD-10-CM | POA: Diagnosis not present

## 2021-04-13 DIAGNOSIS — Z9884 Bariatric surgery status: Secondary | ICD-10-CM | POA: Insufficient documentation

## 2021-04-13 DIAGNOSIS — E559 Vitamin D deficiency, unspecified: Secondary | ICD-10-CM | POA: Diagnosis not present

## 2021-04-13 DIAGNOSIS — D582 Other hemoglobinopathies: Secondary | ICD-10-CM

## 2021-04-13 DIAGNOSIS — D563 Thalassemia minor: Secondary | ICD-10-CM | POA: Diagnosis not present

## 2021-04-13 DIAGNOSIS — D508 Other iron deficiency anemias: Secondary | ICD-10-CM | POA: Diagnosis not present

## 2021-04-13 DIAGNOSIS — Z0000 Encounter for general adult medical examination without abnormal findings: Secondary | ICD-10-CM | POA: Diagnosis not present

## 2021-04-13 DIAGNOSIS — N920 Excessive and frequent menstruation with regular cycle: Secondary | ICD-10-CM | POA: Insufficient documentation

## 2021-04-13 LAB — IRON AND TIBC
Iron: 42 ug/dL (ref 41–142)
Saturation Ratios: 13 % — ABNORMAL LOW (ref 21–57)
TIBC: 316 ug/dL (ref 236–444)
UIBC: 274 ug/dL (ref 120–384)

## 2021-04-13 LAB — FERRITIN: Ferritin: 62 ng/mL (ref 11–307)

## 2021-04-13 LAB — CBC WITH DIFFERENTIAL/PLATELET
Abs Immature Granulocytes: 0.02 10*3/uL (ref 0.00–0.07)
Basophils Absolute: 0.1 10*3/uL (ref 0.0–0.1)
Basophils Relative: 1 %
Eosinophils Absolute: 0.4 10*3/uL (ref 0.0–0.5)
Eosinophils Relative: 4 %
HCT: 36 % (ref 36.0–46.0)
Hemoglobin: 12.4 g/dL (ref 12.0–15.0)
Immature Granulocytes: 0 %
Lymphocytes Relative: 42 %
Lymphs Abs: 3.9 10*3/uL (ref 0.7–4.0)
MCH: 25.7 pg — ABNORMAL LOW (ref 26.0–34.0)
MCHC: 34.4 g/dL (ref 30.0–36.0)
MCV: 74.7 fL — ABNORMAL LOW (ref 80.0–100.0)
Monocytes Absolute: 0.7 10*3/uL (ref 0.1–1.0)
Monocytes Relative: 8 %
Neutro Abs: 4.3 10*3/uL (ref 1.7–7.7)
Neutrophils Relative %: 45 %
Platelets: 280 10*3/uL (ref 150–400)
RBC: 4.82 MIL/uL (ref 3.87–5.11)
RDW: 14.7 % (ref 11.5–15.5)
WBC: 9.4 10*3/uL (ref 4.0–10.5)
nRBC: 0 % (ref 0.0–0.2)

## 2021-04-13 NOTE — Assessment & Plan Note (Signed)
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

## 2021-04-13 NOTE — Progress Notes (Signed)
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   The total time spent in the appointment was 20 minutes encounter with patients including review of chart and various tests results, discussions about plan of care and coordination of care plan   All questions were answered. The patient knows to call the clinic with any problems, questions or concerns. No barriers to learning was detected.    Artis Delay, MD 8/4/202210:44 AM  INTERVAL HISTORY: Karina [name] 40 y.o. female returns for further follow-up She denies excessive heavy menstruation No other forms of bleeding She has chronic fatigue despite normal hemoglobin No recent IV iron infusion reaction  SUMMARY OF HEMATOLOGIC HISTORY:  Karina [name] is here because of severe iron deficiency She has diagnosis of thalassemia and history of bariatric surgery causing multiple mineral deficiencies: specifically, B12, iron, thiamine and  vitamin D Her baseline blood work in July 2015 was normal. She had no recent labs since February 2017. She was found to have severe iron deficiency anemia Recently, she desired to become pregnant. She has a daughter, born in 13. Soon after that, she has an early trimester miscarriage She was evaluated by GYN and is currently on fertility treatment She was found to have abnormal CBC from recent blood work. She is recommended Iv iron therapy prior to becoming pregnant She complained of recent chest pain on exertion, shortness of breath on minimal exertion, pre-syncopal episodes, fatigue and palpitations. She had not noticed any recent bleeding such as epistaxis, hematuria or hematochezia. She had history of menorhagia The patient denies over the counter NSAID ingestion. She is not on antiplatelets agents. Her last colonoscopy was normal on 02/26/13. She had EGD on 08/28/12 which showed gastritis She had no prior history or diagnosis of cancer. Her age appropriate screening programs are up-to-date. 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 She never donated blood or received blood transfusion The patient was prescribed oral iron supplements and she takes three daily but it caused severe constipation without success of improving her anemia She received 2 doses of intravenous iron in July 2017 with improvement of ferritin level She was lost to follow-up.  In August 2020, she received further intravenous iron for recurrent iron deficiency In November 2021, she underwent colonoscopy and was diagnosed with colitis  I have reviewed the past medical history, past surgical history, social history and family history with the patient and they are unchanged from previous note.  ALLERGIES:  has No Known  Allergies.  MEDICATIONS:  Current Outpatient Medications  Medication Sig Dispense Refill   albuterol (PROVENTIL HFA;VENTOLIN HFA) 108 (90 BASE) MCG/ACT inhaler Inhale  1-2 puffs into the lungs every 6 (six) hours as needed for wheezing or shortness of breath.      ALPRAZolam (XANAX) 0.5 MG tablet Take 0.5 mg by mouth 3 (three) times daily as needed for anxiety.     ALPRAZolam (XANAX) 0.5 MG tablet Take 1 tablet (0.5 mg total) by mouth daily as needed for anxiety 30 tablet 0   benzonatate (TESSALON PERLES) 100 MG capsule Take 1 capsule (100 mg total) by mouth 3 (three) times daily as needed. 20 capsule 0   betamethasone valerate ointment (VALISONE) 0.1 % Apply 1 application topically 2 (two) times daily as needed (skin irritation (applied to affected areas of hands)).      cabergoline (DOSTINEX) 0.5 MG tablet TAKE 1/2 TABLET BY MOUTH 2 TIMES WEEKLY 8 tablet 6   Calcipotriene-Betameth Diprop (ENSTILAR) 0.005-0.064 % FOAM Apply 1 application topically daily. 60 g 0   ergocalciferol (VITAMIN D2) 1.25 MG (50000 UT) capsule Take 1 capsule (50,000 Units total) by mouth every 7 (seven) days. 12 capsule 0   escitalopram (LEXAPRO) 5 MG tablet Take 1 tablet by mouth daily.     furosemide (LASIX) 20 MG tablet Take 1 tablet (20 mg total) by mouth daily as needed for up to 5 days. 30 tablet 1   mesalamine (LIALDA) 1.2 g EC tablet 2 tab(s)     metFORMIN (GLUCOPHAGE) 1000 MG tablet TAKE 1 TABLET BY MOUTH TWICE DAILY. 60 tablet 3   mupirocin ointment (BACTROBAN) 2 % Apply 1 application topically 2 (two) times daily. 22 g 6   naproxen (NAPROSYN) 500 MG tablet Take 1 tablet (500 mg total) by mouth 2 (two) times daily with a meal. 30 tablet 0   VENTOLIN HFA 108 (90 Base) MCG/ACT inhaler Inhale 2 puffs into the lungs every 4 hours as needed for wheezing 18 g 11   VITAMIN D, CHOLECALCIFEROL, PO      Vitamin D, Ergocalciferol, (DRISDOL) 1.25 MG (50000 UNIT) CAPS capsule Take 50,000 Units by mouth every Sunday.     No current facility-administered medications for this visit.     REVIEW OF SYSTEMS:   Constitutional: Denies fevers, chills or night sweats Eyes: Denies blurriness of  vision Ears, nose, mouth, throat, and face: Denies mucositis or sore throat Respiratory: Denies cough, dyspnea or wheezes Cardiovascular: Denies palpitation, chest discomfort or lower extremity swelling Gastrointestinal:  Denies nausea, heartburn or change in bowel habits Skin: Denies abnormal skin rashes Lymphatics: Denies new lymphadenopathy or easy bruising Neurological:Denies numbness, tingling or new weaknesses Behavioral/Psych: Mood is stable, no new changes  All other systems were reviewed with the patient and are negative.  PHYSICAL EXAMINATION: ECOG PERFORMANCE STATUS: 1 - Symptomatic but completely ambulatory  Vitals:   04/13/21 0850  BP: 113/67  Pulse: 61  Resp: 18  Temp: 97.9 F (36.6 C)  SpO2: 100%   Filed Weights   04/13/21 0850  Weight: (!) 327 lb 9.6 oz (148.6 kg)    GENERAL:alert, no distress and comfortable NEURO: alert & oriented x 3 with fluent speech, no focal motor/sensory deficits  LABORATORY DATA:  I have reviewed the data as listed     Component Value Date/Time   NA 137 08/09/2017 1500   K 4.4 08/09/2017 1500   CL 103 08/09/2017 1500   CO2 29 08/09/2017 1500   GLUCOSE 94 08/09/2017 1500   BUN  12 08/09/2017 1500   CREATININE 0.73 08/09/2017 1500   CALCIUM 8.6 (L) 08/09/2017 1500   PROT 7.9 08/21/2012 1010   ALBUMIN 3.2 (L) 08/21/2012 1010   AST 30 08/21/2012 1010   ALT 41 (H) 08/21/2012 1010   ALKPHOS 131 (H) 08/21/2012 1010   BILITOT 0.5 08/21/2012 1010   GFRNONAA >60 08/09/2017 1500   GFRAA >60 08/09/2017 1500    No results found for: SPEP, UPEP  Lab Results  Component Value Date   WBC 9.4 04/13/2021   NEUTROABS 4.3 04/13/2021   HGB 12.4 04/13/2021   HCT 36.0 04/13/2021   MCV 74.7 (L) 04/13/2021   PLT 280 04/13/2021      Chemistry      Component Value Date/Time   NA 137 08/09/2017 1500   K 4.4 08/09/2017 1500   CL 103 08/09/2017 1500   CO2 29 08/09/2017 1500   BUN 12 08/09/2017 1500   CREATININE 0.73 08/09/2017 1500       Component Value Date/Time   CALCIUM 8.6 (L) 08/09/2017 1500   ALKPHOS 131 (H) 08/21/2012 1010   AST 30 08/21/2012 1010   ALT 41 (H) 08/21/2012 1010   BILITOT 0.5 08/21/2012 1010

## 2021-04-13 NOTE — Assessment & Plan Note (Signed)
She continues to have regular menstruation 5 to 6 days a month but she denies excessive menorrhagia I recommend her to avoid NSAID

## 2021-04-13 NOTE — Assessment & Plan Note (Signed)
The patient is known to have thalassemia trait Observe closely

## 2021-04-13 NOTE — Assessment & Plan Note (Signed)
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

## 2021-04-20 ENCOUNTER — Encounter: Payer: Self-pay | Admitting: Hematology and Oncology

## 2021-04-27 ENCOUNTER — Encounter: Payer: Self-pay | Admitting: Hematology and Oncology

## 2021-04-27 ENCOUNTER — Ambulatory Visit (INDEPENDENT_AMBULATORY_CARE_PROVIDER_SITE_OTHER): Payer: 59 | Admitting: Family Medicine

## 2021-05-09 ENCOUNTER — Other Ambulatory Visit (HOSPITAL_COMMUNITY): Payer: Self-pay

## 2021-05-09 ENCOUNTER — Other Ambulatory Visit: Payer: Self-pay | Admitting: Endocrinology

## 2021-05-09 DIAGNOSIS — E559 Vitamin D deficiency, unspecified: Secondary | ICD-10-CM | POA: Diagnosis not present

## 2021-05-09 DIAGNOSIS — E049 Nontoxic goiter, unspecified: Secondary | ICD-10-CM

## 2021-05-09 DIAGNOSIS — E221 Hyperprolactinemia: Secondary | ICD-10-CM | POA: Diagnosis not present

## 2021-05-09 DIAGNOSIS — E8881 Metabolic syndrome: Secondary | ICD-10-CM | POA: Diagnosis not present

## 2021-05-09 MED ORDER — INSULIN PEN NEEDLE 32G X 4 MM MISC
6 refills | Status: DC
  Filled 2021-05-09 – 2021-07-07 (×2): qty 100, 90d supply, fill #0

## 2021-05-09 MED ORDER — SAXENDA 18 MG/3ML ~~LOC~~ SOPN
3.0000 mg | PEN_INJECTOR | Freq: Every day | SUBCUTANEOUS | 6 refills | Status: DC
  Filled 2021-05-09 – 2021-05-25 (×3): qty 15, 30d supply, fill #0

## 2021-05-10 ENCOUNTER — Other Ambulatory Visit (HOSPITAL_COMMUNITY): Payer: Self-pay

## 2021-05-11 ENCOUNTER — Other Ambulatory Visit (HOSPITAL_COMMUNITY): Payer: Self-pay

## 2021-05-11 ENCOUNTER — Ambulatory Visit (INDEPENDENT_AMBULATORY_CARE_PROVIDER_SITE_OTHER): Payer: 59 | Admitting: Family Medicine

## 2021-05-12 ENCOUNTER — Other Ambulatory Visit: Payer: 59

## 2021-05-19 ENCOUNTER — Other Ambulatory Visit (HOSPITAL_COMMUNITY): Payer: Self-pay

## 2021-05-19 ENCOUNTER — Ambulatory Visit
Admission: RE | Admit: 2021-05-19 | Discharge: 2021-05-19 | Disposition: A | Discharge status: Home / Self Care | Payer: 59 | Source: Ambulatory Visit | Attending: Endocrinology | Admitting: Endocrinology

## 2021-05-19 DIAGNOSIS — E049 Nontoxic goiter, unspecified: Secondary | ICD-10-CM

## 2021-05-24 ENCOUNTER — Other Ambulatory Visit: Payer: Self-pay | Admitting: Endocrinology

## 2021-05-24 DIAGNOSIS — E221 Hyperprolactinemia: Secondary | ICD-10-CM

## 2021-05-25 ENCOUNTER — Other Ambulatory Visit (HOSPITAL_COMMUNITY): Payer: Self-pay

## 2021-06-02 ENCOUNTER — Telehealth: Payer: 59 | Admitting: Family

## 2021-06-02 ENCOUNTER — Other Ambulatory Visit (HOSPITAL_COMMUNITY): Payer: Self-pay

## 2021-06-02 DIAGNOSIS — J329 Chronic sinusitis, unspecified: Secondary | ICD-10-CM | POA: Diagnosis not present

## 2021-06-02 MED ORDER — AMOXICILLIN-POT CLAVULANATE 875-125 MG PO TABS
1.0000 | ORAL_TABLET | Freq: Two times a day (BID) | ORAL | 0 refills | Status: DC
  Filled 2021-06-02: qty 14, 7d supply, fill #0

## 2021-06-02 NOTE — Progress Notes (Signed)

## 2021-06-26 ENCOUNTER — Other Ambulatory Visit (HOSPITAL_BASED_OUTPATIENT_CLINIC_OR_DEPARTMENT_OTHER): Payer: Self-pay

## 2021-06-27 ENCOUNTER — Other Ambulatory Visit (HOSPITAL_BASED_OUTPATIENT_CLINIC_OR_DEPARTMENT_OTHER): Payer: Self-pay

## 2021-06-27 MED ORDER — ALPRAZOLAM 0.5 MG PO TABS
ORAL_TABLET | ORAL | 0 refills | Status: DC
  Filled 2021-06-27: qty 30, 30d supply, fill #0

## 2021-06-27 MED ORDER — VITAMIN D (ERGOCALCIFEROL) 1.25 MG (50000 UNIT) PO CAPS
50000.0000 [IU] | ORAL_CAPSULE | ORAL | 0 refills | Status: DC
  Filled 2021-06-27: qty 12, 84d supply, fill #0

## 2021-06-29 DIAGNOSIS — R891 Abnormal level of hormones in specimens from other organs, systems and tissues: Secondary | ICD-10-CM | POA: Diagnosis not present

## 2021-06-29 DIAGNOSIS — Z113 Encounter for screening for infections with a predominantly sexual mode of transmission: Secondary | ICD-10-CM | POA: Diagnosis not present

## 2021-06-29 DIAGNOSIS — Z01419 Encounter for gynecological examination (general) (routine) without abnormal findings: Secondary | ICD-10-CM | POA: Diagnosis not present

## 2021-06-30 ENCOUNTER — Other Ambulatory Visit (HOSPITAL_COMMUNITY): Payer: Self-pay

## 2021-06-30 ENCOUNTER — Other Ambulatory Visit (HOSPITAL_BASED_OUTPATIENT_CLINIC_OR_DEPARTMENT_OTHER): Payer: Self-pay

## 2021-07-03 ENCOUNTER — Other Ambulatory Visit (HOSPITAL_BASED_OUTPATIENT_CLINIC_OR_DEPARTMENT_OTHER): Payer: Self-pay

## 2021-07-03 ENCOUNTER — Encounter: Payer: Self-pay | Admitting: Hematology and Oncology

## 2021-07-03 MED ORDER — INFLUENZA VAC SPLIT QUAD 0.5 ML IM SUSY
PREFILLED_SYRINGE | INTRAMUSCULAR | 0 refills | Status: DC
  Filled 2021-07-03: qty 0.5, 1d supply, fill #0

## 2021-07-04 ENCOUNTER — Other Ambulatory Visit (HOSPITAL_COMMUNITY): Payer: Self-pay

## 2021-07-04 MED ORDER — TIZANIDINE HCL 2 MG PO TABS
2.0000 mg | ORAL_TABLET | Freq: Three times a day (TID) | ORAL | 2 refills | Status: DC | PRN
  Filled 2021-07-04: qty 60, 10d supply, fill #0

## 2021-07-04 MED ORDER — SAXENDA 18 MG/3ML ~~LOC~~ SOPN
PEN_INJECTOR | SUBCUTANEOUS | 6 refills | Status: DC
  Filled 2021-07-04: qty 15, 30d supply, fill #0

## 2021-07-05 ENCOUNTER — Other Ambulatory Visit (HOSPITAL_COMMUNITY): Payer: Self-pay

## 2021-07-05 MED ORDER — SPIRONOLACTONE 50 MG PO TABS
50.0000 mg | ORAL_TABLET | ORAL | 6 refills | Status: DC
  Filled 2021-07-05: qty 30, 30d supply, fill #0

## 2021-07-05 MED ORDER — CABERGOLINE 0.5 MG PO TABS
0.5000 mg | ORAL_TABLET | ORAL | 3 refills | Status: DC
  Filled 2021-07-05: qty 12, 42d supply, fill #0

## 2021-07-06 ENCOUNTER — Other Ambulatory Visit (HOSPITAL_COMMUNITY): Payer: Self-pay

## 2021-07-06 ENCOUNTER — Encounter (HOSPITAL_BASED_OUTPATIENT_CLINIC_OR_DEPARTMENT_OTHER): Payer: Self-pay | Admitting: Pharmacist

## 2021-07-06 ENCOUNTER — Other Ambulatory Visit (HOSPITAL_BASED_OUTPATIENT_CLINIC_OR_DEPARTMENT_OTHER): Payer: Self-pay

## 2021-07-06 MED FILL — Metformin HCl Tab 1000 MG: ORAL | 30 days supply | Qty: 60 | Fill #1 | Status: AC

## 2021-07-07 ENCOUNTER — Other Ambulatory Visit (HOSPITAL_COMMUNITY): Payer: Self-pay

## 2021-07-07 ENCOUNTER — Encounter: Payer: Self-pay | Admitting: Hematology and Oncology

## 2021-07-10 ENCOUNTER — Other Ambulatory Visit: Payer: Self-pay | Admitting: Hematology and Oncology

## 2021-07-13 ENCOUNTER — Telehealth: Payer: 59 | Admitting: Physician Assistant

## 2021-07-13 ENCOUNTER — Other Ambulatory Visit (HOSPITAL_COMMUNITY): Payer: Self-pay

## 2021-07-13 DIAGNOSIS — J208 Acute bronchitis due to other specified organisms: Secondary | ICD-10-CM

## 2021-07-13 MED ORDER — BENZONATATE 100 MG PO CAPS
100.0000 mg | ORAL_CAPSULE | Freq: Three times a day (TID) | ORAL | 0 refills | Status: DC | PRN
  Filled 2021-07-13: qty 30, 10d supply, fill #0

## 2021-07-13 MED ORDER — PREDNISONE 10 MG (21) PO TBPK
ORAL_TABLET | ORAL | 0 refills | Status: DC
  Filled 2021-07-13: qty 21, 6d supply, fill #0

## 2021-07-13 NOTE — Progress Notes (Signed)
We are sorry that you are not feeling well.  Here is how we plan to help! ° °Based on your presentation I believe you most likely have A cough due to a virus.  This is called viral bronchitis and is best treated by rest, plenty of fluids and control of the cough.  You may use Ibuprofen or Tylenol as directed to help your symptoms.   °  °In addition you may use A prescription cough medication called Tessalon Perles 100mg. You may take 1-2 capsules every 8 hours as needed for your cough. ° °Prednisone 10 mg daily for 6 days (see taper instructions below) ° °Directions for 6 day taper: °Day 1: 2 tablets before breakfast, 1 after both lunch & dinner and 2 at bedtime °Day 2: 1 tab before breakfast, 1 after both lunch & dinner and 2 at bedtime °Day 3: 1 tab at each meal & 1 at bedtime °Day 4: 1 tab at breakfast, 1 at lunch, 1 at bedtime °Day 5: 1 tab at breakfast & 1 tab at bedtime °Day 6: 1 tab at breakfast ° °From your responses in the eVisit questionnaire you describe inflammation in the upper respiratory tract which is causing a significant cough.  This is commonly called Bronchitis and has four common causes:   °Allergies °Viral Infections °Acid Reflux °Bacterial Infection °Allergies, viruses and acid reflux are treated by controlling symptoms or eliminating the cause. An example might be a cough caused by taking certain blood pressure medications. You stop the cough by changing the medication. Another example might be a cough caused by acid reflux. Controlling the reflux helps control the cough. ° °USE OF BRONCHODILATOR ("RESCUE") INHALERS: °There is a risk from using your bronchodilator too frequently.  The risk is that over-reliance on a medication which only relaxes the muscles surrounding the breathing tubes can reduce the effectiveness of medications prescribed to reduce swelling and congestion of the tubes themselves.  Although you feel brief relief from the bronchodilator inhaler, your asthma may actually be  worsening with the tubes becoming more swollen and filled with mucus.  This can delay other crucial treatments, such as oral steroid medications. If you need to use a bronchodilator inhaler daily, several times per day, you should discuss this with your provider.  There are probably better treatments that could be used to keep your asthma under control.  °   °HOME CARE °Only take medications as instructed by your medical team. °Complete the entire course of an antibiotic. °Drink plenty of fluids and get plenty of rest. °Avoid close contacts especially the very young and the elderly °Cover your mouth if you cough or cough into your sleeve. °Always remember to wash your hands °A steam or ultrasonic humidifier can help congestion.  ° °GET HELP RIGHT AWAY IF: °You develop worsening fever. °You become short of breath °You cough up blood. °Your symptoms persist after you have completed your treatment plan °MAKE SURE YOU  °Understand these instructions. °Will watch your condition. °Will get help right away if you are not doing well or get worse. °  ° °Thank you for choosing an e-visit. ° °Your e-visit answers were reviewed by a board certified advanced clinical practitioner to complete your personal care plan. Depending upon the condition, your plan could have included both over the counter or prescription medications. ° °Please review your pharmacy choice. Make sure the pharmacy is open so you can pick up prescription now. If there is a problem, you may contact your provider   through MyChart messaging and have the prescription routed to another pharmacy.  Your safety is important to us. If you have drug allergies check your prescription carefully.  ° °For the next 24 hours you can use MyChart to ask questions about today's visit, request a non-urgent call back, or ask for a work or school excuse. °You will get an email in the next two days asking about your experience. I hope that your e-visit has been valuable and will  speed your recovery. ° °I provided 5 minutes of non face-to-face time during this encounter for chart review and documentation.  ° °

## 2021-07-14 ENCOUNTER — Inpatient Hospital Stay: Payer: 59

## 2021-07-14 ENCOUNTER — Inpatient Hospital Stay: Payer: 59 | Admitting: Hematology and Oncology

## 2021-07-18 ENCOUNTER — Telehealth: Payer: 59 | Admitting: Physician Assistant

## 2021-07-18 DIAGNOSIS — B9689 Other specified bacterial agents as the cause of diseases classified elsewhere: Secondary | ICD-10-CM

## 2021-07-18 DIAGNOSIS — J208 Acute bronchitis due to other specified organisms: Secondary | ICD-10-CM

## 2021-07-18 DIAGNOSIS — Z20822 Contact with and (suspected) exposure to covid-19: Secondary | ICD-10-CM | POA: Diagnosis not present

## 2021-07-18 MED ORDER — AZITHROMYCIN 250 MG PO TABS
ORAL_TABLET | ORAL | 0 refills | Status: AC
  Filled 2021-07-18: qty 6, 5d supply, fill #0

## 2021-07-18 MED ORDER — PROMETHAZINE-DM 6.25-15 MG/5ML PO SYRP
5.0000 mL | ORAL_SOLUTION | Freq: Four times a day (QID) | ORAL | 0 refills | Status: DC | PRN
  Filled 2021-07-18: qty 118, 6d supply, fill #0

## 2021-07-18 NOTE — Progress Notes (Signed)
Message sent to patient requesting further input regarding current symptoms. Awaiting patient response.  

## 2021-07-18 NOTE — Progress Notes (Signed)
We are sorry that you are not feeling well.  Here is how we plan to help!  Based on your presentation I believe you most likely have A cough due to bacteria.  When patients have a fever and a productive cough with a change in color or increased sputum production, we are concerned about bacterial bronchitis.  If left untreated it can progress to pneumonia.  If your symptoms do not improve with your treatment plan it is important that you contact your provider.   I have prescribed Azithromyin 250 mg: two tablets now and then one tablet daily for 4 additonal days    In addition you may use a prescription cough medication called promethazine-dm. I have sent this to your pharmacy.    From your responses in the eVisit questionnaire you describe inflammation in the upper respiratory tract which is causing a significant cough.  This is commonly called Bronchitis and has four common causes:   Allergies Viral Infections Acid Reflux Bacterial Infection Allergies, viruses and acid reflux are treated by controlling symptoms or eliminating the cause. An example might be a cough caused by taking certain blood pressure medications. You stop the cough by changing the medication. Another example might be a cough caused by acid reflux. Controlling the reflux helps control the cough.  USE OF BRONCHODILATOR ("RESCUE") INHALERS: There is a risk from using your bronchodilator too frequently.  The risk is that over-reliance on a medication which only relaxes the muscles surrounding the breathing tubes can reduce the effectiveness of medications prescribed to reduce swelling and congestion of the tubes themselves.  Although you feel brief relief from the bronchodilator inhaler, your asthma may actually be worsening with the tubes becoming more swollen and filled with mucus.  This can delay other crucial treatments, such as oral steroid medications. If you need to use a bronchodilator inhaler daily, several times per day, you  should discuss this with your provider.  There are probably better treatments that could be used to keep your asthma under control.     HOME CARE Only take medications as instructed by your medical team. Complete the entire course of an antibiotic. Drink plenty of fluids and get plenty of rest. Avoid close contacts especially the very young and the elderly Cover your mouth if you cough or cough into your sleeve. Always remember to wash your hands A steam or ultrasonic humidifier can help congestion.   GET HELP RIGHT AWAY IF: You develop worsening fever. You become short of breath You cough up blood. Your symptoms persist after you have completed your treatment plan MAKE SURE YOU  Understand these instructions. Will watch your condition. Will get help right away if you are not doing well or get worse.    Thank you for choosing an e-visit.  Your e-visit answers were reviewed by a board certified advanced clinical practitioner to complete your personal care plan. Depending upon the condition, your plan could have included both over the counter or prescription medications.  Please review your pharmacy choice. Make sure the pharmacy is open so you can pick up prescription now. If there is a problem, you may contact your provider through [employer] and have the prescription routed to another pharmacy.  Your safety is important to [place]. If you have drug allergies check your prescription carefully.   For the next 24 hours you can use MyChart to ask questions about today's visit, request a non-urgent call back, or ask for a work or school excuse. You  will get an email in the next two days asking about your experience. I hope that your e-visit has been valuable and will speed your recovery.

## 2021-07-18 NOTE — Progress Notes (Signed)
I have spent 5 minutes in review of e-visit questionnaire, review and updating patient chart, medical decision making and response to patient.   Kalaya Infantino Cody Gatsby Chismar, PA-C    

## 2021-07-19 ENCOUNTER — Other Ambulatory Visit (HOSPITAL_COMMUNITY): Payer: Self-pay

## 2021-08-07 DIAGNOSIS — N946 Dysmenorrhea, unspecified: Secondary | ICD-10-CM | POA: Diagnosis not present

## 2021-08-09 DIAGNOSIS — K5190 Ulcerative colitis, unspecified, without complications: Secondary | ICD-10-CM | POA: Diagnosis not present

## 2021-08-09 DIAGNOSIS — E221 Hyperprolactinemia: Secondary | ICD-10-CM | POA: Diagnosis not present

## 2021-08-09 DIAGNOSIS — N838 Other noninflammatory disorders of ovary, fallopian tube and broad ligament: Secondary | ICD-10-CM | POA: Diagnosis not present

## 2021-08-09 DIAGNOSIS — E282 Polycystic ovarian syndrome: Secondary | ICD-10-CM | POA: Diagnosis not present

## 2021-08-09 DIAGNOSIS — K760 Fatty (change of) liver, not elsewhere classified: Secondary | ICD-10-CM | POA: Diagnosis not present

## 2021-08-11 ENCOUNTER — Inpatient Hospital Stay: Payer: 59

## 2021-08-11 ENCOUNTER — Telehealth: Payer: Self-pay | Admitting: Hematology and Oncology

## 2021-08-11 ENCOUNTER — Telehealth: Payer: Self-pay

## 2021-08-11 ENCOUNTER — Inpatient Hospital Stay: Payer: 59 | Admitting: Hematology and Oncology

## 2021-08-11 NOTE — Telephone Encounter (Signed)
Scheduled per sch msg. Called and left msg  

## 2021-08-11 NOTE — Telephone Encounter (Signed)
Called pt per MD concerning appt she missed this morning. Pt states she called Wednesday to r/s as her daughter is sick with the flu. Pt states she never received a call back. MD aware, message sent to scheduling to call pt to r/s. Pt aware and verbalized thanks.

## 2021-08-24 ENCOUNTER — Inpatient Hospital Stay: Payer: 59 | Admitting: Hematology and Oncology

## 2021-08-24 ENCOUNTER — Inpatient Hospital Stay: Payer: 59

## 2021-09-13 DIAGNOSIS — E221 Hyperprolactinemia: Secondary | ICD-10-CM | POA: Diagnosis not present

## 2021-09-13 DIAGNOSIS — E282 Polycystic ovarian syndrome: Secondary | ICD-10-CM | POA: Diagnosis not present

## 2021-09-13 DIAGNOSIS — K760 Fatty (change of) liver, not elsewhere classified: Secondary | ICD-10-CM | POA: Diagnosis not present

## 2021-09-13 DIAGNOSIS — E8881 Metabolic syndrome: Secondary | ICD-10-CM | POA: Diagnosis not present

## 2021-09-15 ENCOUNTER — Inpatient Hospital Stay: Payer: 59

## 2021-09-15 ENCOUNTER — Encounter: Payer: Self-pay | Admitting: Hematology and Oncology

## 2021-09-15 ENCOUNTER — Other Ambulatory Visit: Payer: Self-pay

## 2021-09-15 ENCOUNTER — Inpatient Hospital Stay: Payer: 59 | Attending: Hematology and Oncology

## 2021-09-15 ENCOUNTER — Inpatient Hospital Stay: Payer: 59 | Admitting: Hematology and Oncology

## 2021-09-15 VITALS — BP 113/65 | HR 54 | Temp 98.3°F | Resp 19

## 2021-09-15 DIAGNOSIS — F5089 Other specified eating disorder: Secondary | ICD-10-CM | POA: Insufficient documentation

## 2021-09-15 DIAGNOSIS — D509 Iron deficiency anemia, unspecified: Secondary | ICD-10-CM | POA: Diagnosis not present

## 2021-09-15 DIAGNOSIS — D508 Other iron deficiency anemias: Secondary | ICD-10-CM | POA: Diagnosis not present

## 2021-09-15 DIAGNOSIS — Z79899 Other long term (current) drug therapy: Secondary | ICD-10-CM | POA: Diagnosis not present

## 2021-09-15 DIAGNOSIS — Z7984 Long term (current) use of oral hypoglycemic drugs: Secondary | ICD-10-CM | POA: Diagnosis not present

## 2021-09-15 DIAGNOSIS — E282 Polycystic ovarian syndrome: Secondary | ICD-10-CM

## 2021-09-15 DIAGNOSIS — Z9884 Bariatric surgery status: Secondary | ICD-10-CM | POA: Diagnosis not present

## 2021-09-15 LAB — CBC WITH DIFFERENTIAL/PLATELET
Abs Immature Granulocytes: 0.03 10*3/uL (ref 0.00–0.07)
Basophils Absolute: 0.1 10*3/uL (ref 0.0–0.1)
Basophils Relative: 1 %
Eosinophils Absolute: 0.5 10*3/uL (ref 0.0–0.5)
Eosinophils Relative: 5 %
HCT: 34.6 % — ABNORMAL LOW (ref 36.0–46.0)
Hemoglobin: 11.7 g/dL — ABNORMAL LOW (ref 12.0–15.0)
Immature Granulocytes: 0 %
Lymphocytes Relative: 37 %
Lymphs Abs: 4 10*3/uL (ref 0.7–4.0)
MCH: 24.1 pg — ABNORMAL LOW (ref 26.0–34.0)
MCHC: 33.8 g/dL (ref 30.0–36.0)
MCV: 71.2 fL — ABNORMAL LOW (ref 80.0–100.0)
Monocytes Absolute: 0.8 10*3/uL (ref 0.1–1.0)
Monocytes Relative: 7 %
Neutro Abs: 5.4 10*3/uL (ref 1.7–7.7)
Neutrophils Relative %: 50 %
Platelets: 289 10*3/uL (ref 150–400)
RBC: 4.86 MIL/uL (ref 3.87–5.11)
RDW: 15 % (ref 11.5–15.5)
WBC: 10.8 10*3/uL — ABNORMAL HIGH (ref 4.0–10.5)
nRBC: 0 % (ref 0.0–0.2)

## 2021-09-15 LAB — IRON AND IRON BINDING CAPACITY (CC-WL,HP ONLY)
Iron: 34 ug/dL (ref 28–170)
Saturation Ratios: 9 % — ABNORMAL LOW (ref 10.4–31.8)
TIBC: 371 ug/dL (ref 250–450)
UIBC: 337 ug/dL (ref 148–442)

## 2021-09-15 LAB — FERRITIN: Ferritin: 28 ng/mL (ref 11–307)

## 2021-09-15 MED ORDER — SODIUM CHLORIDE 0.9 % IV SOLN
200.0000 mg | Freq: Once | INTRAVENOUS | Status: AC
Start: 2021-09-15 — End: 2021-09-15
  Administered 2021-09-15: 200 mg via INTRAVENOUS
  Filled 2021-09-15: qty 200

## 2021-09-15 MED ORDER — SODIUM CHLORIDE 0.9 % IV SOLN
INTRAVENOUS | Status: DC
Start: 2021-09-15 — End: 2021-09-15

## 2021-09-15 NOTE — Patient Instructions (Signed)

## 2021-09-15 NOTE — Assessment & Plan Note (Signed)
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 We discussed some of the risks, benefits, and alternatives of intravenous iron infusions. The patient is symptomatic from anemia and the iron level is critically low. She tolerated oral iron supplement poorly and desires to achieved higher levels of iron faster for adequate hematopoesis. Some of the side-effects to be expected including risks of infusion reactions, phlebitis, headaches, nausea and fatigue.  The patient is willing to proceed. Patient education material was dispensed.  Goal is to keep ferritin level greater than 50 and resolution of anemia  I plan to see her again in 4 months for further follow-up

## 2021-09-15 NOTE — Progress Notes (Signed)
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 We discussed some of the risks, benefits, and alternatives of intravenous iron infusions. The patient is symptomatic from anemia and the iron level is critically low. She tolerated oral iron supplement poorly and desires to achieved higher levels of iron faster for adequate hematopoesis. Some of the side-effects to be expected including risks of infusion reactions, phlebitis, headaches, nausea and fatigue.  The patient is willing to proceed. Patient education material was dispensed.  Goal is to keep ferritin level greater than 50 and resolution of anemia  I plan to see her again in 4 months for further follow-up  PCOS (polycystic ovarian syndrome) She has polycystic ovarian syndrome, menorrhagia and is known to have uterine fibroids We discussed the risk and benefits of Lupron injection She will think about it  No orders of the defined types were placed in this encounter.   The total time spent in the appointment was 20 minutes encounter with patients including review of chart and various tests results, discussions about plan of care and coordination of care plan   All questions were answered. The patient knows to call the clinic with any problems, questions or concerns. No barriers to learning was detected.    Artis Delay, MD 1/6/20235:19 PM  INTERVAL HISTORY: Karina [name] 41 y.o. female returns for further evaluation and follow-up for chronic iron deficiency anemia She denies active Crohn's disease She has heavy  menstruation with intermittent passage of clots  SUMMARY OF HEMATOLOGIC HISTORY:  [name] is here because of severe iron deficiency She has diagnosis of thalassemia and history of bariatric surgery causing multiple mineral deficiencies: specifically, B12, iron, thiamine and vitamin D Her baseline blood work in July 2015 was normal. She had no recent labs since February 2017. She was found to have severe iron deficiency anemia Recently, she desired to become pregnant. She has a daughter, born in 37. Soon after that, she has an early trimester miscarriage She was evaluated by GYN and is currently on fertility treatment She was found to have abnormal CBC from recent blood work. She is recommended Iv iron therapy prior to becoming pregnant She complained of recent chest pain on exertion, shortness of breath on minimal exertion, pre-syncopal episodes, fatigue and palpitations. She had not noticed any recent bleeding such as epistaxis, hematuria or hematochezia. She had history of menorhagia The patient denies over the counter NSAID ingestion. She is not on antiplatelets agents. Her last colonoscopy was normal on 02/26/13. She had EGD on 08/28/12 which showed gastritis She had no prior history or diagnosis of cancer. Her age appropriate screening programs are up-to-date. 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 She never donated blood or received blood transfusion The patient was prescribed oral iron supplements and she takes three daily but it caused severe constipation without success of improving her anemia She received 2 doses of intravenous iron in July 2017 with improvement of ferritin level She was lost to follow-up.  In August 2020, she received further intravenous iron for recurrent iron deficiency In November 2021, she underwent colonoscopy  and was diagnosed with colitis  I have reviewed the past medical history, past surgical history,  social history and family history with the patient and they are unchanged from previous note.  ALLERGIES:  has No Known Allergies.  MEDICATIONS:  Current Outpatient Medications  Medication Sig Dispense Refill   ALPRAZolam (XANAX) 0.5 MG tablet Take 0.5 mg by mouth 3 (three) times daily as needed for anxiety.     betamethasone valerate ointment (VALISONE) 0.1 % Apply 1 application topically 2 (two) times daily as needed (skin irritation (applied to affected areas of hands)).      cabergoline (DOSTINEX) 0.5 MG tablet Take 1 tablet by mouth twice a week for 42 days 12 tablet 3   Calcipotriene-Betameth Diprop (ENSTILAR) 0.005-0.064 % FOAM Apply 1 application topically daily. 60 g 0   metFORMIN (GLUCOPHAGE) 1000 MG tablet TAKE 1 TABLET BY MOUTH TWICE DAILY. 60 tablet 3   mupirocin ointment (BACTROBAN) 2 % Apply 1 application topically 2 (two) times daily. 22 g 6   spironolactone (ALDACTONE) 50 MG tablet Take 1 tablet by mouth once daily 30 tablet 6   tiZANidine (ZANAFLEX) 2 MG tablet Take 1-2 tablets (2-4 mg total) by mouth 3 (three) times daily as needed. 60 tablet 2   VENTOLIN HFA 108 (90 Base) MCG/ACT inhaler Inhale 2 puffs into the lungs every 4 hours as needed for wheezing 18 g 11   Vitamin D, Ergocalciferol, (DRISDOL) 1.25 MG (50000 UNIT) CAPS capsule Take 1 capsule (50,000 Units total) by mouth every 7 (seven) days. 12 capsule 0   No current facility-administered medications for this visit.     REVIEW OF SYSTEMS:   Constitutional: Denies fevers, chills or night sweats Eyes: Denies blurriness of vision Ears, nose, mouth, throat, and face: Denies mucositis or sore throat Respiratory: Denies cough, dyspnea or wheezes Cardiovascular: Denies palpitation, chest discomfort or lower extremity swelling Gastrointestinal:  Denies nausea, heartburn or change in bowel habits Skin: Denies abnormal skin rashes Lymphatics: Denies new lymphadenopathy or easy bruising Neurological:Denies numbness,  tingling or new weaknesses Behavioral/Psych: Mood is stable, no new changes  All other systems were reviewed with the patient and are negative.  PHYSICAL EXAMINATION: ECOG PERFORMANCE STATUS: 1 - Symptomatic but completely ambulatory  Vitals:   09/15/21 0825  BP: 125/70  Pulse: 79  Resp: 18  Temp: 98.8 F (37.1 C)  SpO2: 96%   Filed Weights   09/15/21 0825  Weight: (!) 328 lb (148.8 kg)    GENERAL:alert, no distress and comfortable  NEURO: alert & oriented x 3 with fluent speech, no focal motor/sensory deficits  LABORATORY DATA:  I have reviewed the data as listed     Component Value Date/Time   NA 137 08/09/2017 1500   K 4.4 08/09/2017 1500   CL 103 08/09/2017 1500   CO2 29 08/09/2017 1500   GLUCOSE 94 08/09/2017 1500   BUN 12 08/09/2017 1500   CREATININE 0.73 08/09/2017 1500   CALCIUM 8.6 (L) 08/09/2017 1500   PROT 7.9 08/21/2012 1010   ALBUMIN 3.2 (L) 08/21/2012 1010   AST 30 08/21/2012 1010   ALT 41 (H) 08/21/2012 1010   ALKPHOS 131 (H) 08/21/2012 1010   BILITOT 0.5 08/21/2012 1010   GFRNONAA >60 08/09/2017 1500   GFRAA >60 08/09/2017 1500    No results found for: SPEP, UPEP  Lab Results  Component Value Date   WBC 10.8 (H) 09/15/2021   NEUTROABS 5.4 09/15/2021   HGB 11.7 (L) 09/15/2021   HCT 34.6 (L) 09/15/2021  MCV 71.2 (L) 09/15/2021   PLT 289 09/15/2021      Chemistry      Component Value Date/Time   NA 137 08/09/2017 1500   K 4.4 08/09/2017 1500   CL 103 08/09/2017 1500   CO2 29 08/09/2017 1500   BUN 12 08/09/2017 1500   CREATININE 0.73 08/09/2017 1500      Component Value Date/Time   CALCIUM 8.6 (L) 08/09/2017 1500   ALKPHOS 131 (H) 08/21/2012 1010   AST 30 08/21/2012 1010   ALT 41 (H) 08/21/2012 1010   BILITOT 0.5 08/21/2012 1010

## 2021-09-15 NOTE — Assessment & Plan Note (Signed)
She has polycystic ovarian syndrome, menorrhagia and is known to have uterine fibroids We discussed the risk and benefits of Lupron injection She will think about it

## 2021-09-15 NOTE — Progress Notes (Signed)
Per Dr. Bertis Ruddy, ok to infuse NS 250 ml during observation per pt request.

## 2021-09-25 DIAGNOSIS — E8881 Metabolic syndrome: Secondary | ICD-10-CM | POA: Diagnosis not present

## 2021-09-25 DIAGNOSIS — K760 Fatty (change of) liver, not elsewhere classified: Secondary | ICD-10-CM | POA: Diagnosis not present

## 2021-09-25 DIAGNOSIS — E221 Hyperprolactinemia: Secondary | ICD-10-CM | POA: Diagnosis not present

## 2021-10-16 DIAGNOSIS — E221 Hyperprolactinemia: Secondary | ICD-10-CM | POA: Diagnosis not present

## 2021-10-16 DIAGNOSIS — E559 Vitamin D deficiency, unspecified: Secondary | ICD-10-CM | POA: Diagnosis not present

## 2021-10-16 DIAGNOSIS — E049 Nontoxic goiter, unspecified: Secondary | ICD-10-CM | POA: Diagnosis not present

## 2021-10-16 DIAGNOSIS — E8881 Metabolic syndrome: Secondary | ICD-10-CM | POA: Diagnosis not present

## 2021-10-24 ENCOUNTER — Other Ambulatory Visit (HOSPITAL_COMMUNITY): Payer: Self-pay

## 2021-10-24 ENCOUNTER — Telehealth: Payer: 59 | Admitting: Physician Assistant

## 2021-10-24 DIAGNOSIS — R197 Diarrhea, unspecified: Secondary | ICD-10-CM

## 2021-10-24 MED ORDER — ONDANSETRON HCL 4 MG PO TABS
4.0000 mg | ORAL_TABLET | Freq: Three times a day (TID) | ORAL | 0 refills | Status: DC | PRN
  Filled 2021-10-24: qty 20, 7d supply, fill #0

## 2021-10-24 NOTE — Progress Notes (Signed)
We are sorry that you are not feeling well.  Here is how we plan to help! ? ?Based on what you have shared with me it looks like you have Acute Infectious Diarrhea. ? ?Most cases of acute diarrhea are due to infections with virus and bacteria and are self-limited conditions lasting less than 14 days. ? ?For your symptoms you may take Imodium 2 mg tablets that are over the counter at your local pharmacy. Take two tablet now and then one after each loose stool up to 6 a day.  ?Antibiotics are not needed for most people with diarrhea. ? ?Optional: Zofran 4 mg 1 tablet every 8 hours as needed for nausea and vomiting ? ?HOME CARE ?We recommend changing your diet to help with your symptoms for the next few days. ?Drink plenty of fluids that contain water salt and sugar. Sports drinks such as Gatorade may help.  ?You may try broths, soups, bananas, applesauce, soft breads, mashed potatoes or crackers.  ?You are considered infectious for as long as the diarrhea continues. Hand washing or use of alcohol based hand sanitizers is recommend. ?It is best to stay out of work or school until your symptoms stop.  ? ?GET HELP RIGHT AWAY ?If you have dark yellow colored urine or do not pass urine frequently you should drink more fluids.   ?If your symptoms worsen  ?If you feel like you are going to pass out (faint) ?You have a new problem ? ?MAKE SURE YOU  ?Understand these instructions. ?Will watch your condition. ?Will get help right away if you are not doing well or get worse. ? ?Thank you for choosing an e-visit. ? ?Your e-visit answers were reviewed by a board certified advanced clinical practitioner to complete your personal care plan. Depending upon the condition, your plan could have included both over the counter or prescription medications. ? ?Please review your pharmacy choice. Make sure the pharmacy is open so you can pick up prescription now. If there is a problem, you may contact your provider through MyChart messaging  and have the prescription routed to another pharmacy.  Your safety is important to us. If you have drug allergies check your prescription carefully.  ? ?For the next 24 hours you can use MyChart to ask questions about today's visit, request a non-urgent call back, or ask for a work or school excuse. ?You will get an email in the next two days asking about your experience. I hope that your e-visit has been valuable and will speed your recovery. ? ? ?Approximately 5 minutes was spent documenting and reviewing patient's chart. ? ?

## 2021-10-25 ENCOUNTER — Encounter: Payer: Self-pay | Admitting: Hematology and Oncology

## 2021-10-25 ENCOUNTER — Other Ambulatory Visit (HOSPITAL_COMMUNITY): Payer: Self-pay

## 2021-11-02 ENCOUNTER — Telehealth: Payer: 59 | Admitting: Physician Assistant

## 2021-11-02 ENCOUNTER — Other Ambulatory Visit (HOSPITAL_COMMUNITY): Payer: Self-pay

## 2021-11-02 DIAGNOSIS — J069 Acute upper respiratory infection, unspecified: Secondary | ICD-10-CM | POA: Diagnosis not present

## 2021-11-02 MED ORDER — IPRATROPIUM BROMIDE 0.03 % NA SOLN
2.0000 | Freq: Two times a day (BID) | NASAL | 0 refills | Status: DC
  Filled 2021-11-02: qty 30, 44d supply, fill #0

## 2021-11-02 MED ORDER — BENZONATATE 100 MG PO CAPS
100.0000 mg | ORAL_CAPSULE | Freq: Three times a day (TID) | ORAL | 0 refills | Status: DC | PRN
  Filled 2021-11-02: qty 30, 10d supply, fill #0

## 2021-11-02 NOTE — Progress Notes (Signed)
E-Visit for Upper Respiratory Infection  ° °We are sorry you are not feeling well.  Here is how we plan to help! ° °Based on what you have shared with me, it looks like you may have a viral upper respiratory infection.  Upper respiratory infections are caused by a large number of viruses; however, rhinovirus is the most common cause.  ° °Symptoms vary from person to person, with common symptoms including sore throat, cough, fatigue or lack of energy and feeling of general discomfort.  A low-grade fever of up to 100.4 may present, but is often uncommon.  Symptoms vary however, and are closely related to a person's age or underlying illnesses.  The most common symptoms associated with an upper respiratory infection are nasal discharge or congestion, cough, sneezing, headache and pressure in the ears and face.  These symptoms usually persist for about 3 to 10 days, but can last up to 2 weeks.  It is important to know that upper respiratory infections do not cause serious illness or complications in most cases.   ° °Upper respiratory infections can be transmitted from person to person, with the most common method of transmission being a person's hands.  The virus is able to live on the skin and can infect other persons for up to 2 hours after direct contact.  Also, these can be transmitted when someone coughs or sneezes; thus, it is important to cover the mouth to reduce this risk.  To keep the spread of the illness at bay, good hand hygiene is very important. ° °This is an infection that is most likely caused by a virus. There are no specific treatments other than to help you with the symptoms until the infection runs its course.  We are sorry you are not feeling well.  Here is how we plan to help! ° ° °For nasal congestion, you may use an oral decongestants such as Mucinex D or if you have glaucoma or high blood pressure use plain Mucinex.  Saline nasal spray or nasal drops can help and can safely be used as often as  needed for congestion.  For your congestion, I have prescribed Ipratropium Bromide nasal spray 0.03% two sprays in each nostril 2-3 times a day ° °If you do not have a history of heart disease, hypertension, diabetes or thyroid disease, prostate/bladder issues or glaucoma, you may also use Sudafed to treat nasal congestion.  It is highly recommended that you consult with a pharmacist or your primary care physician to ensure this medication is safe for you to take.    ° °If you have a cough, you may use cough suppressants such as Delsym and Robitussin.  If you have glaucoma or high blood pressure, you can also use Coricidin HBP.   °For cough I have prescribed for you A prescription cough medication called Tessalon Perles 100 mg. You may take 1-2 capsules every 8 hours as needed for cough ° °If you have a sore or scratchy throat, use a saltwater gargle- ¼ to ½ teaspoon of salt dissolved in a 4-ounce to 8-ounce glass of warm water.  Gargle the solution for approximately 15-30 seconds and then spit.  It is important not to swallow the solution.  You can also use throat lozenges/cough drops and Chloraseptic spray to help with throat pain or discomfort.  Warm or cold liquids can also be helpful in relieving throat pain. ° °For headache, pain or general discomfort, you can use Ibuprofen or Tylenol as directed.   °  Some authorities believe that zinc sprays or the use of Echinacea may shorten the course of your symptoms.  May be worthwhile to consider testing for Covid 19. Home tests are ok. Your symptoms could be consistent with Covid 19.   HOME CARE Only take medications as instructed by your medical team. Be sure to drink plenty of fluids. Water is fine as well as fruit juices, sodas and electrolyte beverages. You may want to stay away from caffeine or alcohol. If you are nauseated, try taking small sips of liquids. How do you know if you are getting enough fluid? Your urine should be a pale yellow or almost  colorless. Get rest. Taking a steamy shower or using a humidifier may help nasal congestion and ease sore throat pain. You can place a towel over your head and breathe in the steam from hot water coming from a faucet. Using a saline nasal spray works much the same way. Cough drops, hard candies and sore throat lozenges may ease your cough. Avoid close contacts especially the very young and the elderly Cover your mouth if you cough or sneeze Always remember to wash your hands.   GET HELP RIGHT AWAY IF: You develop worsening fever. If your symptoms do not improve within 10 days You develop yellow or green discharge from your nose over 3 days. You have coughing fits You develop a severe head ache or visual changes. You develop shortness of breath, difficulty breathing or start having chest pain Your symptoms persist after you have completed your treatment plan  MAKE SURE YOU  Understand these instructions. Will watch your condition. Will get help right away if you are not doing well or get worse.  Thank you for choosing an e-visit.  Your e-visit answers were reviewed by a board certified advanced clinical practitioner to complete your personal care plan. Depending upon the condition, your plan could have included both over the counter or prescription medications.  Please review your pharmacy choice. Make sure the pharmacy is open so you can pick up prescription now. If there is a problem, you may contact your provider through [employer] and have the prescription routed to another pharmacy.  Your safety is important to [place]. If you have drug allergies check your prescription carefully.   For the next 24 hours you can use MyChart to ask questions about today's visit, request a non-urgent call back, or ask for a work or school excuse. You will get an email in the next two days asking about your experience. I hope that your e-visit has been valuable and will speed your recovery.   I  provided 5 minutes of non face-to-face time during this encounter for chart review and documentation.

## 2021-11-06 ENCOUNTER — Other Ambulatory Visit (HOSPITAL_COMMUNITY): Payer: Self-pay

## 2021-11-06 ENCOUNTER — Telehealth: Payer: 59 | Admitting: Physician Assistant

## 2021-11-06 DIAGNOSIS — J019 Acute sinusitis, unspecified: Secondary | ICD-10-CM

## 2021-11-06 DIAGNOSIS — B9689 Other specified bacterial agents as the cause of diseases classified elsewhere: Secondary | ICD-10-CM

## 2021-11-06 MED ORDER — AMOXICILLIN-POT CLAVULANATE 875-125 MG PO TABS
1.0000 | ORAL_TABLET | Freq: Two times a day (BID) | ORAL | 0 refills | Status: DC
  Filled 2021-11-06: qty 14, 7d supply, fill #0

## 2021-11-06 NOTE — Progress Notes (Signed)

## 2021-12-07 DIAGNOSIS — M21621 Bunionette of right foot: Secondary | ICD-10-CM | POA: Diagnosis not present

## 2021-12-07 DIAGNOSIS — S9032XA Contusion of left foot, initial encounter: Secondary | ICD-10-CM | POA: Diagnosis not present

## 2021-12-07 DIAGNOSIS — S90852A Superficial foreign body, left foot, initial encounter: Secondary | ICD-10-CM | POA: Diagnosis not present

## 2021-12-07 DIAGNOSIS — M21622 Bunionette of left foot: Secondary | ICD-10-CM | POA: Diagnosis not present

## 2021-12-07 DIAGNOSIS — M21612 Bunion of left foot: Secondary | ICD-10-CM | POA: Diagnosis not present

## 2021-12-07 DIAGNOSIS — M21611 Bunion of right foot: Secondary | ICD-10-CM | POA: Diagnosis not present

## 2021-12-07 DIAGNOSIS — R262 Difficulty in walking, not elsewhere classified: Secondary | ICD-10-CM | POA: Diagnosis not present

## 2021-12-24 ENCOUNTER — Telehealth: Payer: 59 | Admitting: Physician Assistant

## 2021-12-24 DIAGNOSIS — J020 Streptococcal pharyngitis: Secondary | ICD-10-CM | POA: Diagnosis not present

## 2021-12-24 MED ORDER — AMOXICILLIN 500 MG PO CAPS: 500.0000 mg | ORAL_CAPSULE | Freq: Two times a day (BID) | ORAL | 0 refills | Status: AC

## 2021-12-24 NOTE — Progress Notes (Signed)

## 2022-01-04 ENCOUNTER — Ambulatory Visit: Payer: 59 | Admitting: Physician Assistant

## 2022-01-04 DIAGNOSIS — L309 Dermatitis, unspecified: Secondary | ICD-10-CM | POA: Diagnosis not present

## 2022-01-04 MED ORDER — OPZELURA 1.5 % EX CREA: 1.0000 "application " | TOPICAL_CREAM | Freq: Every day | CUTANEOUS | 6 refills | Status: DC

## 2022-01-15 ENCOUNTER — Encounter: Payer: Self-pay | Admitting: Physician Assistant

## 2022-01-15 NOTE — Progress Notes (Signed)
? ?  Follow-Up Visit ?  ?Subjective  ?Karina [name] is a 41 y.o. female who presents for the following: Follow-up (Dermatitis on hands. Using the Lilly. Some better but still there. ). Still painful and limits her ability to work and function.  ? ? ?The following portions of the chart were reviewed this encounter and updated as appropriate:  Tobacco  Allergies  Meds  Problems  Med Hx  Surg Hx  Fam Hx   ?  ? ?Objective  ?Well appearing patient in no apparent distress; mood and affect are within normal limits. ? ?A focused examination was performed including hands. Relevant physical exam findings are noted in the Assessment and Plan. ? ?Left Hand - Anterior, Right Hand - Anterior ?Severe xerosis and erythema hyperpigmentation. ? ? ?Assessment & Plan  ?Dermatitis ?Left Hand - Anterior; Right Hand - Anterior ? ?Patient will call if she wants to start Dupixent. 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  ? ?Ruxolitinib Phosphate (OPZELURA) 1.5 % CREA - Left Hand - Anterior, Right Hand - Anterior ?Apply 1 application. topically daily. ? ?Related Medications ?Calcipotriene-Betameth Diprop (ENSTILAR) 0.005-0.064 % FOAM ?Apply 1 application topically daily. ? ?mupirocin ointment (BACTROBAN) 2 % ?Apply 1 application topically 2 (two) times daily. ? ? ? ?I, Liberta Gimpel, PA-C, have reviewed all documentation's for this visit.  The documentation on 01/15/22 for the exam, diagnosis, procedures and orders are all accurate and complete. ?

## 2022-02-13 ENCOUNTER — Inpatient Hospital Stay: Payer: 59 | Admitting: Hematology and Oncology

## 2022-02-13 ENCOUNTER — Inpatient Hospital Stay: Payer: 59

## 2022-03-05 ENCOUNTER — Other Ambulatory Visit (HOSPITAL_COMMUNITY): Payer: Self-pay

## 2022-03-05 ENCOUNTER — Telehealth: Payer: 59 | Admitting: Emergency Medicine

## 2022-03-05 DIAGNOSIS — J029 Acute pharyngitis, unspecified: Secondary | ICD-10-CM | POA: Diagnosis not present

## 2022-03-05 MED ORDER — AMOXICILLIN 500 MG PO CAPS
500.0000 mg | ORAL_CAPSULE | Freq: Three times a day (TID) | ORAL | 0 refills | Status: DC
  Filled 2022-03-05: qty 30, 10d supply, fill #0

## 2022-03-07 ENCOUNTER — Telehealth: Payer: 59 | Admitting: Physician Assistant

## 2022-03-07 DIAGNOSIS — L03113 Cellulitis of right upper limb: Secondary | ICD-10-CM

## 2022-03-07 NOTE — Progress Notes (Signed)
Because of having such a large area with large blisters and a high fever, I feel your condition warrants further evaluation and I recommend that you be seen in a face to face visit.   NOTE: There will be NO CHARGE for this eVisit   If you are having a true medical emergency please call 911.      For an urgent face to face visit, [place] has seven urgent care centers for your convenience:     [facility] Health Ur[facility] Center at [facility] Directions [phone] [address] [place], [place] [address]    [facility] Health Urgent Care Center [facility]) Get Driving Directions [phone] [address] [place], [place] [address]  [facility] Health Urgent Care Center [facility]) Get Driving Directions [phone] [address] [place],  [place]  [address]  [facility] Health Urgent Care Center [facility] at [facility] Directions  [phone] [address] [place],  [place] [address]   [facility] Health Urgent Care at [facility] Get Driving Directions [phone] [address], [address] [place], [place] [address]   [facility] Health Urgent Care at [facility] Get Driving Directions  [phone] [address] [place], [place] [address]   [facility] Health Urgent Care at [facility] Directions [phone] [address], [address] [place], [place] [address]  Your MyChart E-visit questionnaire answers were reviewed by a board certified advanced clinical practitioner to complete your personal care plan based on your specific symptoms.  Thank you for using e-Visits.   I provided 5 minutes of non face-to-face time during this encounter for chart review and documentation.

## 2022-03-09 ENCOUNTER — Other Ambulatory Visit: Payer: Self-pay

## 2022-03-09 ENCOUNTER — Inpatient Hospital Stay (HOSPITAL_BASED_OUTPATIENT_CLINIC_OR_DEPARTMENT_OTHER)
Admission: EM | Admit: 2022-03-09 | Discharge: 2022-03-14 | DRG: 872 | Disposition: A | Discharge status: Home / Self Care | Payer: 59 | Source: Ambulatory Visit | Attending: Internal Medicine | Admitting: Internal Medicine

## 2022-03-09 ENCOUNTER — Emergency Department (HOSPITAL_BASED_OUTPATIENT_CLINIC_OR_DEPARTMENT_OTHER): Payer: 59

## 2022-03-09 DIAGNOSIS — G629 Polyneuropathy, unspecified: Secondary | ICD-10-CM | POA: Diagnosis present

## 2022-03-09 DIAGNOSIS — D563 Thalassemia minor: Secondary | ICD-10-CM | POA: Diagnosis present

## 2022-03-09 DIAGNOSIS — Z713 Dietary counseling and surveillance: Secondary | ICD-10-CM

## 2022-03-09 DIAGNOSIS — Z87891 Personal history of nicotine dependence: Secondary | ICD-10-CM

## 2022-03-09 DIAGNOSIS — Z825 Family history of asthma and other chronic lower respiratory diseases: Secondary | ICD-10-CM | POA: Diagnosis not present

## 2022-03-09 DIAGNOSIS — Z8249 Family history of ischemic heart disease and other diseases of the circulatory system: Secondary | ICD-10-CM

## 2022-03-09 DIAGNOSIS — Z823 Family history of stroke: Secondary | ICD-10-CM | POA: Diagnosis not present

## 2022-03-09 DIAGNOSIS — K2100 Gastro-esophageal reflux disease with esophagitis, without bleeding: Secondary | ICD-10-CM | POA: Diagnosis not present

## 2022-03-09 DIAGNOSIS — M7989 Other specified soft tissue disorders: Secondary | ICD-10-CM | POA: Diagnosis not present

## 2022-03-09 DIAGNOSIS — Z833 Family history of diabetes mellitus: Secondary | ICD-10-CM | POA: Diagnosis not present

## 2022-03-09 DIAGNOSIS — E282 Polycystic ovarian syndrome: Secondary | ICD-10-CM | POA: Diagnosis present

## 2022-03-09 DIAGNOSIS — Z8041 Family history of malignant neoplasm of ovary: Secondary | ICD-10-CM

## 2022-03-09 DIAGNOSIS — L0390 Cellulitis, unspecified: Secondary | ICD-10-CM | POA: Diagnosis present

## 2022-03-09 DIAGNOSIS — K219 Gastro-esophageal reflux disease without esophagitis: Secondary | ICD-10-CM | POA: Diagnosis present

## 2022-03-09 DIAGNOSIS — K760 Fatty (change of) liver, not elsewhere classified: Secondary | ICD-10-CM | POA: Diagnosis present

## 2022-03-09 DIAGNOSIS — R21 Rash and other nonspecific skin eruption: Secondary | ICD-10-CM | POA: Diagnosis present

## 2022-03-09 DIAGNOSIS — Z9049 Acquired absence of other specified parts of digestive tract: Secondary | ICD-10-CM

## 2022-03-09 DIAGNOSIS — A419 Sepsis, unspecified organism: Principal | ICD-10-CM | POA: Diagnosis present

## 2022-03-09 DIAGNOSIS — M349 Systemic sclerosis, unspecified: Secondary | ICD-10-CM | POA: Diagnosis present

## 2022-03-09 DIAGNOSIS — J45909 Unspecified asthma, uncomplicated: Secondary | ICD-10-CM | POA: Diagnosis present

## 2022-03-09 DIAGNOSIS — F419 Anxiety disorder, unspecified: Secondary | ICD-10-CM | POA: Diagnosis not present

## 2022-03-09 DIAGNOSIS — F320 Major depressive disorder, single episode, mild: Secondary | ICD-10-CM | POA: Diagnosis not present

## 2022-03-09 DIAGNOSIS — Z79899 Other long term (current) drug therapy: Secondary | ICD-10-CM

## 2022-03-09 DIAGNOSIS — Z7984 Long term (current) use of oral hypoglycemic drugs: Secondary | ICD-10-CM

## 2022-03-09 DIAGNOSIS — L03113 Cellulitis of right upper limb: Secondary | ICD-10-CM | POA: Diagnosis present

## 2022-03-09 DIAGNOSIS — R739 Hyperglycemia, unspecified: Secondary | ICD-10-CM | POA: Diagnosis present

## 2022-03-09 DIAGNOSIS — Z6841 Body Mass Index (BMI) 40.0 and over, adult: Secondary | ICD-10-CM

## 2022-03-09 DIAGNOSIS — D508 Other iron deficiency anemias: Secondary | ICD-10-CM | POA: Diagnosis not present

## 2022-03-09 DIAGNOSIS — G4730 Sleep apnea, unspecified: Secondary | ICD-10-CM | POA: Diagnosis present

## 2022-03-09 DIAGNOSIS — F32A Depression, unspecified: Secondary | ICD-10-CM | POA: Diagnosis present

## 2022-03-09 DIAGNOSIS — Z9884 Bariatric surgery status: Secondary | ICD-10-CM

## 2022-03-09 DIAGNOSIS — R7303 Prediabetes: Secondary | ICD-10-CM | POA: Diagnosis present

## 2022-03-09 DIAGNOSIS — J4520 Mild intermittent asthma, uncomplicated: Secondary | ICD-10-CM | POA: Diagnosis not present

## 2022-03-09 DIAGNOSIS — Z8049 Family history of malignant neoplasm of other genital organs: Secondary | ICD-10-CM

## 2022-03-09 DIAGNOSIS — Z90721 Acquired absence of ovaries, unilateral: Secondary | ICD-10-CM

## 2022-03-09 DIAGNOSIS — D509 Iron deficiency anemia, unspecified: Secondary | ICD-10-CM | POA: Diagnosis present

## 2022-03-09 DIAGNOSIS — Z800 Family history of malignant neoplasm of digestive organs: Secondary | ICD-10-CM

## 2022-03-09 LAB — PREGNANCY, URINE: Preg Test, Ur: NEGATIVE

## 2022-03-09 LAB — CBC WITH DIFFERENTIAL/PLATELET
Abs Immature Granulocytes: 0.48 10*3/uL — ABNORMAL HIGH (ref 0.00–0.07)
Basophils Absolute: 0.1 10*3/uL (ref 0.0–0.1)
Basophils Relative: 1 %
Eosinophils Absolute: 0.3 10*3/uL (ref 0.0–0.5)
Eosinophils Relative: 2 %
HCT: 33 % — ABNORMAL LOW (ref 36.0–46.0)
Hemoglobin: 10.9 g/dL — ABNORMAL LOW (ref 12.0–15.0)
Immature Granulocytes: 3 %
Lymphocytes Relative: 15 %
Lymphs Abs: 2.5 10*3/uL (ref 0.7–4.0)
MCH: 22.6 pg — ABNORMAL LOW (ref 26.0–34.0)
MCHC: 33 g/dL (ref 30.0–36.0)
MCV: 68.3 fL — ABNORMAL LOW (ref 80.0–100.0)
Monocytes Absolute: 1 10*3/uL (ref 0.1–1.0)
Monocytes Relative: 6 %
Neutro Abs: 12.4 10*3/uL — ABNORMAL HIGH (ref 1.7–7.7)
Neutrophils Relative %: 73 %
Platelets: 350 10*3/uL (ref 150–400)
RBC: 4.83 MIL/uL (ref 3.87–5.11)
RDW: 16.4 % — ABNORMAL HIGH (ref 11.5–15.5)
Smear Review: NORMAL
WBC: 16.8 10*3/uL — ABNORMAL HIGH (ref 4.0–10.5)
nRBC: 0 % (ref 0.0–0.2)

## 2022-03-09 LAB — LACTIC ACID, PLASMA
Lactic Acid, Venous: 0.8 mmol/L (ref 0.5–1.9)
Lactic Acid, Venous: 0.7 mmol/L (ref 0.5–1.9)

## 2022-03-09 LAB — HEPATIC FUNCTION PANEL
ALT: 25 U/L (ref 0–44)
AST: 24 U/L (ref 15–41)
Albumin: 3.1 g/dL — ABNORMAL LOW (ref 3.5–5.0)
Alkaline Phosphatase: 165 U/L — ABNORMAL HIGH (ref 38–126)
Bilirubin, Direct: 0.2 mg/dL (ref 0.0–0.2)
Indirect Bilirubin: 0.5 mg/dL (ref 0.3–0.9)
Total Bilirubin: 0.7 mg/dL (ref 0.3–1.2)
Total Protein: 8.7 g/dL — ABNORMAL HIGH (ref 6.5–8.1)

## 2022-03-09 LAB — BASIC METABOLIC PANEL
Anion gap: 8 (ref 5–15)
BUN: 18 mg/dL (ref 6–20)
CO2: 22 mmol/L (ref 22–32)
Calcium: 9 mg/dL (ref 8.9–10.3)
Chloride: 104 mmol/L (ref 98–111)
Creatinine, Ser: 0.88 mg/dL (ref 0.44–1.00)
GFR, Estimated: >60 mL/min (ref >60)
Glucose, Bld: 79 mg/dL (ref 70–99)
Potassium: 3.5 mmol/L (ref 3.5–5.1)
Sodium: 134 mmol/L — ABNORMAL LOW (ref 135–145)

## 2022-03-09 MED ORDER — ENOXAPARIN SODIUM 40 MG/0.4ML IJ SOSY
40.0000 mg | PREFILLED_SYRINGE | INTRAMUSCULAR | Status: DC
  Administered 2022-03-09 – 2022-03-10 (×2): 40 mg via SUBCUTANEOUS
  Filled 2022-03-09 (×2): qty 0.4

## 2022-03-09 MED ORDER — VANCOMYCIN HCL 1250 MG/250ML IV SOLN
1250.0000 mg | Freq: Two times a day (BID) | INTRAVENOUS | Status: DC
  Administered 2022-03-10 – 2022-03-12 (×6): 1250 mg via INTRAVENOUS
  Filled 2022-03-09 (×7): qty 250

## 2022-03-09 MED ORDER — LACTATED RINGERS IV SOLN: INTRAVENOUS | Status: AC

## 2022-03-09 MED ORDER — OXYCODONE-ACETAMINOPHEN 5-325 MG PO TABS
1.0000 | ORAL_TABLET | Freq: Once | ORAL | Status: AC
  Administered 2022-03-09: 1 via ORAL
  Filled 2022-03-09: qty 1

## 2022-03-09 MED ORDER — SODIUM CHLORIDE 0.9 % IV SOLN
2.0000 g | Freq: Three times a day (TID) | INTRAVENOUS | Status: DC
  Administered 2022-03-09 – 2022-03-10 (×2): 2 g via INTRAVENOUS
  Filled 2022-03-09 (×2): qty 12.5

## 2022-03-09 MED ORDER — LACTATED RINGERS IV BOLUS
1000.0000 mL | Freq: Once | INTRAVENOUS | Status: AC
  Administered 2022-03-09: 1000 mL via INTRAVENOUS

## 2022-03-09 MED ORDER — VANCOMYCIN HCL IN DEXTROSE 1-5 GM/200ML-% IV SOLN
1000.0000 mg | INTRAVENOUS | Status: AC
  Administered 2022-03-09: 1000 mg via INTRAVENOUS
  Filled 2022-03-09: qty 200

## 2022-03-09 MED ORDER — SODIUM CHLORIDE 0.9 % IV SOLN: INTRAVENOUS | Status: DC | PRN

## 2022-03-09 MED ORDER — ONDANSETRON 4 MG PO TBDP
8.0000 mg | ORAL_TABLET | Freq: Once | ORAL | Status: AC
Start: 2022-03-09 — End: 2022-03-09
  Administered 2022-03-09: 8 mg via ORAL
  Filled 2022-03-09: qty 2

## 2022-03-09 MED ORDER — VANCOMYCIN HCL IN DEXTROSE 1-5 GM/200ML-% IV SOLN
1000.0000 mg | Freq: Once | INTRAVENOUS | Status: AC
Start: 2022-03-09 — End: 2022-03-09
  Administered 2022-03-09: 1000 mg via INTRAVENOUS
  Filled 2022-03-09: qty 200

## 2022-03-09 MED ORDER — SPIRONOLACTONE 25 MG PO TABS: 50.0000 mg | ORAL_TABLET | Freq: Every day | ORAL | Status: DC

## 2022-03-09 MED ORDER — OXYCODONE-ACETAMINOPHEN 5-325 MG PO TABS
1.0000 | ORAL_TABLET | Freq: Four times a day (QID) | ORAL | Status: AC | PRN
  Administered 2022-03-09 – 2022-03-10 (×3): 1 via ORAL
  Filled 2022-03-09 (×3): qty 1

## 2022-03-09 MED ORDER — ALBUTEROL SULFATE (2.5 MG/3ML) 0.083% IN NEBU: 3.0000 mL | INHALATION_SOLUTION | RESPIRATORY_TRACT | Status: DC | PRN

## 2022-03-09 MED ORDER — SODIUM CHLORIDE 0.9% FLUSH
3.0000 mL | Freq: Two times a day (BID) | INTRAVENOUS | Status: DC
  Administered 2022-03-09 – 2022-03-13 (×8): 3 mL via INTRAVENOUS

## 2022-03-09 NOTE — Progress Notes (Signed)
Some blisters to Right arm noted to have opened and draining serous fluid, non odorous.  Message placed to Dr. Alinda Money.  Ok given to place petroleum gauze over blistering, and apply dry dressing.  Area cleansed with NS, and reddened area marked with skin marker to monitor progression.  Petroleum gauze, abd pads and Kerlix  applied.  Pt states it feels improved with dressing applied.

## 2022-03-09 NOTE — H&P (Addendum)
History and Physical   Karina [name] WIO:[id number] DOB: [birth year] DOA: 03/09/2022  PCP: Jonathon Bellows, PA-C   Patient coming from: Home  Chief Complaint: Skin infection  HPI: Karina [name] is a 41 y.o. female with medical history significant of asthma, biliary dyskinesia, fatty liver, anxiety, depression, thalassemia trait, gastric banding, anemia, PCOS, GERD presenting with worsening skin infection.  6 days ago began to have muscle aches this was followed by redness near her elbow 5 days ago.  She then had spreading of this and went to urgent care and was started on amoxicillin and has been taking this 3 times a day without missing any doses.  Reports some initial improvement and then worsening with spreading and increasing pain.  Also noted fever to 102 at home.  Went to urgent care sent her to the ED for further evaluation.  Denies chills, chest pain, shortness breath, Donnell pain, constipation, diarrhea, nausea, vomiting.  ED Course: Vital signs in the ED significant for blood pressure in the 38G to 536 systolic.  Lab work-up included CMP with sodium 134, protein 8.7, albumin 3.1, alk phos 165.  CBC with leukocytosis to 16.8, hemoglobin stable at 10.9 near baseline of 11-12.  Lactic acid normal x2.  Blood cultures pending.  Elbow x-ray with soft tissue swelling without osseous abnormality.  Patient received oxycodone, vancomycin, Zofran in the ED.  Review of Systems: As per HPI otherwise all other systems reviewed and are negative.  Past Medical History:  Diagnosis Date   Anemia    takes iron daily   Anxiety    no meds   Asthma    albuterol as needed   Chronic bronchitis    bronchitis x 1 event in her twenties   Complication of anesthesia    decreased respirations with lap band sx   Dysrhythmia     " arrhythmia during endoscopy procedure "    Edema extremities    hx of edema in lower ext.used to take lasix - none since twenties   Gallbladder disease     GERD (gastroesophageal reflux disease)    nexium    Hemoglobin C trait ([place])    History of chicken pox    HSV (herpes simplex virus) anogenital infection    Neuropathy    feet   Pre-diabetes    Pregnant    svd 06/19/17   Sleep apnea    Does not use cpap nightly   SVD (spontaneous vaginal delivery)    x 2   Wears glasses     Past Surgical History:  Procedure Laterality Date   BIOPSY  07/14/2020   Procedure: BIOPSY;  Surgeon: Juanita Craver, MD;  Location: [facility];  Service: Endoscopy;;   CARPAL TUNNEL RELEASE Bilateral 2008, 2010   CHOLECYSTECTOMY  01/11/2012   Procedure: LAPAROSCOPIC CHOLECYSTECTOMY WITH INTRAOPERATIVE CHOLANGIOGRAM;  Surgeon: Gwenyth Ober, MD;  Location: [facility];  Service: General;  Laterality: N/A;   COLONOSCOPY     COLONOSCOPY WITH PROPOFOL N/A 07/14/2020   Procedure: COLONOSCOPY WITH PROPOFOL;  Surgeon: Juanita Craver, MD;  Location: [facility];  Service: Endoscopy;  Laterality: N/A;   DILATION AND EVACUATION N/A 03/24/2020   Procedure: DILATATION AND EVACUATION;  Surgeon: Waymon Amato, MD;  Location: [facility];  Service: Gynecology;  Laterality: N/A;   LAPAROSCOPIC BILATERAL SALPINGECTOMY Right 08/14/2017   Procedure: LAPAROSCOPIC RIGHT  SALPINGECTOMY;  Surgeon: Delsa Bern, MD;  Location: [facility];  Service: Gynecology;  Laterality: Right;   LAPAROSCOPIC GASTRIC BANDING  UPPER GI ENDOSCOPY     WISDOM TOOTH EXTRACTION      Social History  reports that she quit smoking about 26 years ago. Her smoking use included cigars and cigarettes. She has a 0.25 pack-year smoking history. She has never used smokeless tobacco. She reports that she does not currently use alcohol. She reports that she does not use drugs.  No Known Allergies  Family History  Problem Relation Age of Onset   Diabetes Sister    Asthma Sister    Heart disease Other    Hyperlipidemia Other    Hypertension Other    Stroke Other    Kidney disease Other    Thyroid disease Other    Ovarian  cancer Maternal Aunt    Prostate cancer Maternal Uncle    Cervical cancer Maternal Aunt        cervial and thyroid   Liver cancer Paternal Grandfather    Prostate cancer Paternal Grandfather    Liver cancer Paternal Uncle    Anesthesia problems Mother    Asthma Maternal Grandmother    Hypotension Neg Hx    Malignant hyperthermia Neg Hx    Pseudochol deficiency Neg Hx   Reviewed on admission  Prior to Admission medications   Medication Sig Start Date End Date Taking? Authorizing Provider  ALPRAZolam Duanne Moron) 0.5 MG tablet Take 0.5 mg by mouth 3 (three) times daily as needed for anxiety. 03/22/20   [provider]  ALPRAZolam Duanne Moron) 0.5 MG tablet 1 tablet 06/27/21   [provider]  betamethasone valerate ointment (VALISONE) 0.1 % Apply 1 application topically 2 (two) times daily as needed (skin irritation (applied to affected areas of hands)).  03/10/20   [provider]  cabergoline (DOSTINEX) 0.5 MG tablet Take 1 tablet by mouth twice a week for 42 days 07/04/21     Calcipotriene-Betameth Diprop (ENSTILAR) 0.005-0.064 % FOAM Apply 1 application topically daily. 12/01/20   Sheffield, Vida Roller R, PA-C  metFORMIN (GLUCOPHAGE) 1000 MG tablet TAKE 1 TABLET BY MOUTH TWICE DAILY. 07/19/20 08/06/21  Delsa Bern, MD  mupirocin ointment (BACTROBAN) 2 % Apply 1 application topically 2 (two) times daily. 01/03/21   Sheffield, Ronalee Red, PA-C  ondansetron (ZOFRAN) 4 MG tablet Take 1 tablet (4 mg total) by mouth every 8 (eight) hours as needed for nausea or vomiting. 10/24/21   Couture, Cortni S, PA-C  Ruxolitinib Phosphate (OPZELURA) 1.5 % CREA Apply 1 application. topically daily. 01/04/22   Warren Danes, PA-C  spironolactone (ALDACTONE) 50 MG tablet Take 1 tablet by mouth once daily 07/04/21     tizanidine (ZANAFLEX) 2 MG capsule tizanidine 2 mg capsule    [provider]  tiZANidine (ZANAFLEX) 2 MG tablet Take 1-2 tablets (2-4 mg total) by mouth 3 (three) times daily  as needed. 07/04/21     VENTOLIN HFA 108 (90 Base) MCG/ACT inhaler Inhale 2 puffs into the lungs every 4 hours as needed for wheezing 03/14/21     Vitamin D, Ergocalciferol, (DRISDOL) 1.25 MG (50000 UNIT) CAPS capsule Take 1 capsule (50,000 Units total) by mouth every 7 (seven) days. 06/27/21       Physical Exam: Vitals:   03/09/22 1145 03/09/22 1256 03/09/22 1508 03/09/22 1800  BP:  (!) 115/57  (!) 97/51  Pulse:  81  86  Resp:  14  16  Temp:   97.9 F (36.6 C)   TempSrc:   Oral   SpO2:  100%  95%  Weight: (!) 145.2 kg  Height: '5\' 5"'  (1.651 m)       Physical Exam Constitutional:      General: She is not in acute distress.    Appearance: Normal appearance.  HENT:     Head: Normocephalic and atraumatic.     Mouth/Throat:     Mouth: Mucous membranes are moist.     Pharynx: Oropharynx is clear.  Eyes:     Extraocular Movements: Extraocular movements intact.     Pupils: Pupils are equal, round, and reactive to light.  Cardiovascular:     Rate and Rhythm: Normal rate and regular rhythm.     Pulses: Normal pulses.     Heart sounds: Normal heart sounds.  Pulmonary:     Effort: Pulmonary effort is normal. No respiratory distress.     Breath sounds: Normal breath sounds.  Abdominal:     General: Bowel sounds are normal. There is no distension.     Palpations: Abdomen is soft.     Tenderness: There is no abdominal tenderness.  Musculoskeletal:        General: No swelling or deformity.  Skin:    General: Skin is warm and dry.     Comments: Right upper extremity with warmth, erythema, edema.  Neurological:     General: No focal deficit present.     Mental Status: Mental status is at baseline.       Labs on Admission: I have personally reviewed following labs and imaging studies  CBC: Recent Labs  Lab 03/09/22 1241  WBC 16.8*  NEUTROABS 12.4*  HGB 10.9*  HCT 33.0*  MCV 68.3*  PLT 998    Basic Metabolic Panel: Recent Labs  Lab 03/09/22 1241  NA 134*  K 3.5   CL 104  CO2 22  GLUCOSE 79  BUN 18  CREATININE 0.88  CALCIUM 9.0    GFR: Estimated Creatinine Clearance: 121.3 mL/min (by C-G formula based on SCr of 0.88 mg/dL).  Liver Function Tests: Recent Labs  Lab 03/09/22 1209  AST 24  ALT 25  ALKPHOS 165*  BILITOT 0.7  PROT 8.7*  ALBUMIN 3.1*    Urine analysis:    Component Value Date/Time   COLORURINE YELLOW 11/04/2016 0820   APPEARANCEUR HAZY (A) 11/04/2016 0820   LABSPEC 1.015 11/04/2016 0820   PHURINE 6.0 11/04/2016 0820   GLUCOSEU NEGATIVE 11/04/2016 0820   HGBUR NEGATIVE 11/04/2016 0820   BILIRUBINUR NEGATIVE 11/04/2016 0820   KETONESUR NEGATIVE 11/04/2016 0820   PROTEINUR NEGATIVE 11/04/2016 0820   UROBILINOGEN 0.2 03/28/2014 1036   NITRITE NEGATIVE 11/04/2016 0820   LEUKOCYTESUR SMALL (A) 11/04/2016 0820    Radiological Exams on Admission: DG Elbow Complete Right  Result Date: 03/09/2022 CLINICAL DATA:  RIGHT arm wound for 5 days, infection, redness and swelling with occasional fever, started antibiotics earlier this week for cellulitis at origin care EXAM: RIGHT ELBOW - COMPLETE 3+ VIEW COMPARISON:  None FINDINGS: Osseous mineralization normal. Joint spaces preserved. No acute fracture, dislocation, or bone destruction. No joint effusion. Diffuse soft tissue swelling RIGHT arm. IMPRESSION: Diffuse soft tissue swelling without acute osseous abnormalities. Electronically Signed   By: Lavonia Dana M.D.   On: 03/09/2022 12:34    EKG: Not performed in the emergency department.  Assessment/Plan Principal Problem:   Cellulitis Active Problems:   Asthma   Anxiety   H/O laparoscopic adjustable gastric banding   Iron deficiency anemia   Depression   Gastroesophageal reflux disease   Cellulitis > Patient presenting with cellulitis possibly severe in the setting  of bullae present on the surface of the tissue.  Not improving despite antibiotics for past 4 to 5 days. > Leukocytosis to 16.8 in the ED x-ray with soft  tissue swelling with no osseous abnormalities. > Started on vancomycin in the ED.  Considering severity will start on cefepime as well. - Monitor on telemetry - Continue with vancomycin, start cefepime - Trend fever curve and white blood cell count - Monitor for spread or improvement - Oxycodone as needed for pain  Asthma - Continue home Albuterol  Anxiety Depression - Not currently taking Xanax very often, can add if needed  PCOS - Noted - Not currently taking spironolactone.  History of gastric banding - Noted  Thalassemia trait - Noted  DVT prophylaxis: Lovenox Code Status:   Full Family Communication:  None on admission Disposition Plan:   Patient is from:  Home  Anticipated DC to:  Home  Anticipated DC date:  2 to 5 days  Anticipated DC barriers: None  Consults called:  None Admission status:  Inpatient, telemetry  Severity of Illness: The appropriate patient status for this patient is INPATIENT. Inpatient status is judged to be reasonable and necessary in order to provide the required intensity of service to ensure the patient's safety. The patient's presenting symptoms, physical exam findings, and initial radiographic and laboratory data in the context of their chronic comorbidities is felt to place them at high risk for further clinical deterioration. Furthermore, it is not anticipated that the patient will be medically stable for discharge from the hospital within 2 midnights of admission.   * I certify that at the point of admission it is my clinical judgment that the patient will require inpatient hospital care spanning beyond 2 midnights from the point of admission due to high intensity of service, high risk for further deterioration and high frequency of surveillance required.Marcelyn Bruins MD [facility]  How to contact the [facility] Attending or Consulting provider [place] or covering provider during after hours [place], for this patient?   Check  the care team in [facility] and look for a) attending/consulting [facility] provider listed and b) the [facility] team listed Log into [email or web address] and use [place]'s universal password to access. If you do not have the password, please contact the hospital operator. Locate the [facility] provider you are looking for under [facility] and page to a number that you can be directly reached. If you still have difficulty reaching the provider, please page the [facility] (Director on Call) for the Hospitalists listed on amion for assistance.  03/09/2022, 8:51 PM

## 2022-03-09 NOTE — ED Provider Notes (Signed)
[facility] EMERGENCY DEPT Provider Note   CSN: [id number] Arrival date & time: 03/09/22  1128     History  Chief Complaint  Patient presents with   Wound Infection    Right Arm    Karina [name] is a 41 y.o. female.  HPI  Patient with medical history of anemia, asthma, GERD, HSV, prediabetes, and today due to skin infection to right upper extremity.  States it started with a prodromal muscle ache 6 days ago.  Started noticing a rash the next day to the elbow which was warm and painful.  Went to urgent care and was started on amoxicillin 3 times daily which she has been taking without any missed doses.  The rash did not improve, it started spreading and being associated with painful blisters that drained clear yellow liquid.  Denies any pus but also was having high fevers with a Tmax of 102 at home.  Was seen in urgent care prior to ED arrival and sent to ED for further work-up and possible admission for IV antibiotics.  She has history of eczema, and has scleroderma.  No history of immunocompromise such as chronic steroids, chemotherapy or radiation, IV drug use.  She was on amoxicillin in April for strep throat, no antibiotic use prior to Monday per patient.  Denies any recent travel or exposures, denies any bug bite.  No history of anaphylaxis, no paresthesias.    Home Medications Prior to Admission medications   Medication Sig Start Date End Date Taking? Authorizing Provider  ALPRAZolam Prudy Feeler) 0.5 MG tablet Take 0.5 mg by mouth 3 (three) times daily as needed for anxiety. 03/22/20   [provider]  ALPRAZolam Prudy Feeler) 0.5 MG tablet 1 tablet 06/27/21   [provider]  amoxicillin (AMOXIL) 500 MG capsule Take 1 capsule (500 mg total) by mouth 3 (three) times daily for 10 days. 03/05/22 03/15/22  Roxy Horseman, PA-C  betamethasone valerate ointment (VALISONE) 0.1 % Apply 1 application topically 2 (two) times daily as needed (skin irritation  (applied to affected areas of hands)).  03/10/20   [provider]  cabergoline (DOSTINEX) 0.5 MG tablet Take 1 tablet by mouth twice a week for 42 days 07/04/21     Calcipotriene-Betameth Diprop (ENSTILAR) 0.005-0.064 % FOAM Apply 1 application topically daily. 12/01/20   Sheffield, Harvin Hazel R, PA-C  metFORMIN (GLUCOPHAGE) 1000 MG tablet TAKE 1 TABLET BY MOUTH TWICE DAILY. 07/19/20 08/06/21  Silverio Lay, MD  mupirocin ointment (BACTROBAN) 2 % Apply 1 application topically 2 (two) times daily. 01/03/21   Sheffield, Judye Bos, PA-C  ondansetron (ZOFRAN) 4 MG tablet Take 1 tablet (4 mg total) by mouth every 8 (eight) hours as needed for nausea or vomiting. 10/24/21   Couture, Cortni S, PA-C  Ruxolitinib Phosphate (OPZELURA) 1.5 % CREA Apply 1 application. topically daily. 01/04/22   Glyn Ade, PA-C  spironolactone (ALDACTONE) 50 MG tablet Take 1 tablet by mouth once daily 07/04/21     tizanidine (ZANAFLEX) 2 MG capsule tizanidine 2 mg capsule    [provider]  tiZANidine (ZANAFLEX) 2 MG tablet Take 1-2 tablets (2-4 mg total) by mouth 3 (three) times daily as needed. 07/04/21     VENTOLIN HFA 108 (90 Base) MCG/ACT inhaler Inhale 2 puffs into the lungs every 4 hours as needed for wheezing 03/14/21     Vitamin D, Ergocalciferol, (DRISDOL) 1.25 MG (50000 UNIT) CAPS capsule Take 1 capsule (50,000 Units total) by mouth every 7 (seven) days. 06/27/21  Allergies    Patient has no known allergies.    Review of Systems   Review of Systems  Physical Exam Updated Vital Signs BP (!) 115/57 (BP Location: Left Arm)   Pulse 81   Temp 97.9 F (36.6 C)   Resp 14   Ht 5\' 5"  (1.651 m)   Wt (!) 145.2 kg   SpO2 100%   BMI 53.25 kg/m  Physical Exam Vitals and nursing note reviewed. Exam conducted with a chaperone present.  Constitutional:      Appearance: Normal appearance.  HENT:     Head: Normocephalic and atraumatic.  Eyes:     General: No scleral icterus.       Right eye:  No discharge.        Left eye: No discharge.     Extraocular Movements: Extraocular movements intact.     Pupils: Pupils are equal, round, and reactive to light.  Cardiovascular:     Rate and Rhythm: Normal rate and regular rhythm.     Pulses: Normal pulses.     Heart sounds: Normal heart sounds. No murmur heard.    No friction rub. No gallop.  Pulmonary:     Effort: Pulmonary effort is normal. No respiratory distress.     Breath sounds: Normal breath sounds.  Abdominal:     General: Abdomen is flat. Bowel sounds are normal. There is no distension.     Palpations: Abdomen is soft.     Tenderness: There is no abdominal tenderness.  Musculoskeletal:        General: Swelling and tenderness present. Normal range of motion.  Skin:    General: Skin is warm and dry.     Capillary Refill: Capillary refill takes less than 2 seconds.     Coloration: Skin is not jaundiced.     Findings: Erythema and rash present.  Neurological:     Mental Status: She is alert. Mental status is at baseline.     Coordination: Coordination normal.        ED Results / Procedures / Treatments   Labs (all labs ordered are listed, but only abnormal results are displayed) Labs Reviewed  BASIC METABOLIC PANEL - Abnormal; Notable for the following components:      Result Value   Sodium 134 (*)    All other components within normal limits  CBC WITH DIFFERENTIAL/PLATELET - Abnormal; Notable for the following components:   WBC 16.8 (*)    Hemoglobin 10.9 (*)    HCT 33.0 (*)    MCV 68.3 (*)    MCH 22.6 (*)    RDW 16.4 (*)    Neutro Abs 12.4 (*)    Abs Immature Granulocytes 0.48 (*)    All other components within normal limits  HEPATIC FUNCTION PANEL - Abnormal; Notable for the following components:   Total Protein 8.7 (*)    Albumin 3.1 (*)    Alkaline Phosphatase 165 (*)    All other components within normal limits  CULTURE, BLOOD (ROUTINE X 2)  CULTURE, BLOOD (ROUTINE X 2)  PREGNANCY, URINE  LACTIC  ACID, PLASMA  LACTIC ACID, PLASMA  HEMOGLOBIN A1C    EKG None  Radiology DG Elbow Complete Right  Result Date: 03/09/2022 CLINICAL DATA:  RIGHT arm wound for 5 days, infection, redness and swelling with occasional fever, started antibiotics earlier this week for cellulitis at origin care EXAM: RIGHT ELBOW - COMPLETE 3+ VIEW COMPARISON:  None FINDINGS: Osseous mineralization normal. Joint spaces preserved. No acute fracture, dislocation,  or bone destruction. No joint effusion. Diffuse soft tissue swelling RIGHT arm. IMPRESSION: Diffuse soft tissue swelling without acute osseous abnormalities. Electronically Signed   By: Ulyses Southward M.D.   On: 03/09/2022 12:34    Procedures Procedures    Medications Ordered in ED Medications  vancomycin (VANCOCIN) IVPB 1000 mg/200 mL premix (1,000 mg Intravenous New Bag/Given 03/09/22 1351)  0.9 %  sodium chloride infusion ( Intravenous New Bag/Given 03/09/22 1349)    ED Course/ Medical Decision Making/ A&P                           Medical Decision Making Amount and/or Complexity of Data Reviewed Labs: ordered. Radiology: ordered.  Risk Prescription drug management. Decision regarding hospitalization.   Patient presents with rash/skin infection of right upper extremity.  Differential includes but not limited to cellulitis, abscess, drug reaction, Stevens-Johnson/TENS although unlikely given was not on antibiotics prior to the rash, sepsis.  Patient has been having fevers at home but is not febrile here in the ED.  Does not meet SIRS criteria based on vital signs but the rash is extensive, discussed with patient reviewed external records from urgent care and she has failed outpatient trial of antibiotics and the rash is spreading and worsening with yellow sanguinous discharge and blisters.  I did get an x-ray which does not show any deeper involvement to the bone but does note diffuse soft tissue swelling.  I ordered laboratory work-up which is  notable for leukocytosis of 16.8.  Glucose is normal, blood culture obtained and pending.  Given patient has failed outpatient antibiotics, the severity of the skin infection I do think patient should be admitted for IV antibiotics and treated more aggressively.  I will consult hospitalist for admission.  Hospitalist request lactic acid and A1c, I have placed those orders.  Appreciate their help coordinating admission and care for this patient.        Final Clinical Impression(s) / ED Diagnoses Final diagnoses:  Cellulitis of right upper extremity    Rx / DC Orders ED Discharge Orders     None         Theron Arista, PA-C 03/09/22 1407    Benjiman Core, MD 03/09/22 [phone]

## 2022-03-09 NOTE — Progress Notes (Signed)
Pharmacy Antibiotic Note  Karina [name] is a 41 y.o. female admitted on 03/09/2022 with cellulitis.  Pharmacy has been consulted for Vanco, Cefepime dosing.  Active Problem(s): right arm wound x5 days (popped by pt). Patient report intermittent redness/swelling to site as well as a fever. Seen by urgent care earlier this week and started on antibiotics (Amoxil).  - c/o Started as muscle ache>>rash warm and painful>>rash was spreading and being associated with painful blisters that drained clear yellow liquid.  Denies any pus but also was having high fevers with a Tmax of 102 at home  PMH: anemia, asthma, GERD, HSV, prediabetes, history of eczema, and has scleroderma,  biliary dyskinesia, fatty liver, anxiety, depression, thalassemia trait, gastric banding, PCOS, GERD, chronic bronchitis, edema, neuropathy, OSA,   ID: Cellulitis with bulla. - Afebrile, WBC 16.8, Scr 0.88  Vanco 6/30>> Cefepime 6/30>>   Plan: Cefepime 2g IV q8hr Vanco 2g IV total on 6/30 Vancomycin 1250 mg IV Q 12 hrs. Goal AUC 400-550. Expected AUC: 517 SCr used: 0.88    Height: 5\' 5"  (165.1 cm) Weight: (!) 145.2 kg (320 lb) IBW/kg (Calculated) : 57  Temp (24hrs), Avg:97.9 F (36.6 C), Min:97.9 F (36.6 C), Max:97.9 F (36.6 C)  Recent Labs  Lab 03/09/22 1241 03/09/22 1430 03/09/22 1606  WBC 16.8*  --   --   CREATININE 0.88  --   --   LATICACIDVEN  --  0.8 0.7    Estimated Creatinine Clearance: 121.3 mL/min (by C-G formula based on SCr of 0.88 mg/dL).    No Known Allergies  Karina Meriweather S. 03/11/22, PharmD, BCPS Clinical Staff Pharmacist [email or web address]  Karina [name] 03/09/2022 9:16 PM

## 2022-03-09 NOTE — ED Triage Notes (Signed)
Patient arrives with complaints of worsening on right arm wound x5 days. Patient report intermittent redness/swelling to site as well as a fever on occasion.   Patient was seen by urgent care earlier this week and started on antibiotics.   Patient states that she also popped the wound earlier this week as well. Went to Urgent Care again today and she was sent here for IV antibiotics due to cellulitis

## 2022-03-09 NOTE — ED Notes (Signed)
Handoff  report given to [facility] RN on 5E at [place] [address]

## 2022-03-09 NOTE — Plan of Care (Signed)

## 2022-03-10 DIAGNOSIS — D508 Other iron deficiency anemias: Secondary | ICD-10-CM

## 2022-03-10 DIAGNOSIS — F419 Anxiety disorder, unspecified: Secondary | ICD-10-CM | POA: Diagnosis not present

## 2022-03-10 DIAGNOSIS — L03113 Cellulitis of right upper limb: Secondary | ICD-10-CM | POA: Diagnosis not present

## 2022-03-10 DIAGNOSIS — K219 Gastro-esophageal reflux disease without esophagitis: Secondary | ICD-10-CM | POA: Diagnosis not present

## 2022-03-10 DIAGNOSIS — J4520 Mild intermittent asthma, uncomplicated: Secondary | ICD-10-CM | POA: Diagnosis not present

## 2022-03-10 LAB — CBC
HCT: 30.9 % — ABNORMAL LOW (ref 36.0–46.0)
Hemoglobin: 10.2 g/dL — ABNORMAL LOW (ref 12.0–15.0)
MCH: 23 pg — ABNORMAL LOW (ref 26.0–34.0)
MCHC: 33 g/dL (ref 30.0–36.0)
MCV: 69.8 fL — ABNORMAL LOW (ref 80.0–100.0)
Platelets: 327 10*3/uL (ref 150–400)
RBC: 4.43 MIL/uL (ref 3.87–5.11)
RDW: 16.6 % — ABNORMAL HIGH (ref 11.5–15.5)
WBC: 12 10*3/uL — ABNORMAL HIGH (ref 4.0–10.5)
nRBC: 0 % (ref 0.0–0.2)

## 2022-03-10 LAB — HEMOGLOBIN A1C
Hgb A1c MFr Bld: 5.3 % (ref 4.8–5.6)
Mean Plasma Glucose: 105 mg/dL

## 2022-03-10 LAB — COMPREHENSIVE METABOLIC PANEL
ALT: 25 U/L (ref 0–44)
AST: 26 U/L (ref 15–41)
Albumin: 2.2 g/dL — ABNORMAL LOW (ref 3.5–5.0)
Alkaline Phosphatase: 157 U/L — ABNORMAL HIGH (ref 38–126)
Anion gap: 4 — ABNORMAL LOW (ref 5–15)
BUN: 13 mg/dL (ref 6–20)
CO2: 26 mmol/L (ref 22–32)
Calcium: 7.9 mg/dL — ABNORMAL LOW (ref 8.9–10.3)
Chloride: 104 mmol/L (ref 98–111)
Creatinine, Ser: 0.86 mg/dL (ref 0.44–1.00)
GFR, Estimated: >60 mL/min (ref >60)
Glucose, Bld: 151 mg/dL — ABNORMAL HIGH (ref 70–99)
Potassium: 3.9 mmol/L (ref 3.5–5.1)
Sodium: 134 mmol/L — ABNORMAL LOW (ref 135–145)
Total Bilirubin: 0.6 mg/dL (ref 0.3–1.2)
Total Protein: 7.2 g/dL (ref 6.5–8.1)

## 2022-03-10 LAB — HIV ANTIBODY (ROUTINE TESTING W REFLEX): HIV Screen 4th Generation wRfx: NONREACTIVE

## 2022-03-10 MED ORDER — PANTOPRAZOLE SODIUM 40 MG PO TBEC
40.0000 mg | DELAYED_RELEASE_TABLET | Freq: Two times a day (BID) | ORAL | Status: DC
  Administered 2022-03-10 – 2022-03-14 (×8): 40 mg via ORAL
  Filled 2022-03-10 (×8): qty 1

## 2022-03-10 MED ORDER — IBUPROFEN 200 MG PO TABS
600.0000 mg | ORAL_TABLET | Freq: Four times a day (QID) | ORAL | Status: DC | PRN
Start: 2022-03-10 — End: 2022-03-14
  Administered 2022-03-10 – 2022-03-13 (×7): 600 mg via ORAL
  Filled 2022-03-10 (×7): qty 3

## 2022-03-10 MED ORDER — ORAL CARE MOUTH RINSE: 15.0000 mL | OROMUCOSAL | Status: DC | PRN

## 2022-03-10 MED ORDER — SODIUM CHLORIDE 0.9 % IV SOLN
2.0000 g | INTRAVENOUS | Status: DC
  Administered 2022-03-10 – 2022-03-12 (×3): 2 g via INTRAVENOUS
  Filled 2022-03-10 (×3): qty 20

## 2022-03-10 MED ORDER — SODIUM CHLORIDE 0.9 % IV SOLN: INTRAVENOUS | Status: DC

## 2022-03-10 MED ORDER — LACTATED RINGERS IV SOLN: INTRAVENOUS | Status: AC

## 2022-03-10 MED ORDER — OXYCODONE HCL 5 MG PO TABS
5.0000 mg | ORAL_TABLET | Freq: Four times a day (QID) | ORAL | Status: DC | PRN
  Administered 2022-03-10 – 2022-03-14 (×10): 5 mg via ORAL
  Filled 2022-03-10 (×10): qty 1

## 2022-03-10 NOTE — Progress Notes (Signed)
[facility] PROGRESS NOTE    Progress Note  Karina [name]  UVO:[id number] DOB: 1981/07/16 DOA: 03/09/2022 PCP: Bailey Mech, PA-C     Brief Narrative:   Karina [name] is an 41 y.o. female past medical history significant for asthma biliary dyskinesia fatty liver thalassemia trait, gastric banding and GERD comes in with right upper extremity cellulitis, which did not improve despite 4 days of antibiotics   Assessment/Plan:   Sepsis due to cellulitis of right upper extremity Has remained afebrile leukocytosis 12 on admission. She was started empirically on IV vancomycin and cefepime. Cultures were ordered. We will de-escalate to IV Rocephin  Asthma Stable continue inhalers.  Anxiety/depression: Continue Xanax as needed.  PCOS: She is not taking Aldactone  Hyperglycemia: Likely due to infectious etiology A1c of 5.3. Continue to monitor.  H/O laparoscopic adjustable gastric banding Noted.  Iron deficiency anemia/thalassemia trait: Noted.  DVT prophylaxis: lovenox Family Communication:none Status is: Inpatient Remains inpatient appropriate because: Acute upper extremity cellulitis    Code Status:     Code Status Orders  (From admission, onward)           Start     Ordered   03/09/22 1940  Full code  Continuous        03/09/22 1940           Code Status History     Date Active Date Inactive Code Status Order ID Comments User Context   03/24/2020 1154 03/25/2020 0006 Full Code [id number]  Hoover Browns, MD Inpatient   06/20/2017 0050 06/21/2017 1541 Full Code [id number]  Gerrit Heck, CNM Inpatient   06/19/2017 1131 06/20/2017 0050 Full Code [id number]  Clearence Cheek, RN Inpatient   05/13/2014 1350 05/15/2014 1822 Full Code [id number]  Standard, Venus, CNM Inpatient   05/11/2014 1426 05/13/2014 1350 Full Code [id number]  Nigel Bridgeman, CNM Inpatient   04/27/2014 1923 04/29/2014 2232 Full Code [id number]  Nigel Bridgeman, CNM Inpatient       Advance Directive Documentation    Flowsheet Row Most Recent Value  Type of Advance Directive Living will  [needs to be updated, per patient]  Pre-existing out of facility DNR order (yellow form or pink MOST form) --  "MOST" Form in Place? --         IV Access:   Peripheral IV   Procedures and diagnostic studies:   DG Elbow Complete Right  Result Date: 03/09/2022 CLINICAL DATA:  RIGHT arm wound for 5 days, infection, redness and swelling with occasional fever, started antibiotics earlier this week for cellulitis at origin care EXAM: RIGHT ELBOW - COMPLETE 3+ VIEW COMPARISON:  None FINDINGS: Osseous mineralization normal. Joint spaces preserved. No acute fracture, dislocation, or bone destruction. No joint effusion. Diffuse soft tissue swelling RIGHT arm. IMPRESSION: Diffuse soft tissue swelling without acute osseous abnormalities. Electronically Signed   By: Ulyses Southward M.D.   On: 03/09/2022 12:34     Medical Consultants:   None.   Subjective:    Karina [name] she relates she still in pain.  Objective:    Vitals:   03/09/22 1800 03/09/22 2246 03/10/22 0249 03/10/22 0646  BP: (!) 97/51 124/84 130/61 (!) 147/72  Pulse: 86 86 74 88  Resp: 16 (!) 22 19 18   Temp:  97.7 F (36.5 C) 98 F (36.7 C) 98.1 F (36.7 C)  TempSrc:  Oral Oral Oral  SpO2: 95% 98% 100% 95%  Weight:      Height:  SpO2: 95 %   Intake/Output Summary (Last 24 hours) at 03/10/2022 0811 Last data filed at 03/10/2022 0259 Gross per 24 hour  Intake 1566.18 ml  Output --  Net 1566.18 ml   Filed Weights   03/09/22 1145  Weight: (!) 145.2 kg    Exam: General exam: In no acute distress. Respiratory system: Good air movement and clear to auscultation. Cardiovascular system: S1 & S2 heard, RRR. No JVD. Gastrointestinal system: Abdomen is nondistended, soft and nontender.  Extremities: No pedal edema. Skin: Right upper extremity is swollen, erythema with bullae I demarcated the  area which is erythematous and tender to touch Psychiatry: Judgement and insight appear normal. Mood & affect appropriate.    Data Reviewed:    Labs: Basic Metabolic Panel: Recent Labs  Lab 03/09/22 1241 03/10/22 0610  NA 134* 134*  K 3.5 3.9  CL 104 104  CO2 22 26  GLUCOSE 79 151*  BUN 18 13  CREATININE 0.88 0.86  CALCIUM 9.0 7.9*   GFR Estimated Creatinine Clearance: 124.2 mL/min (by C-G formula based on SCr of 0.86 mg/dL). Liver Function Tests: Recent Labs  Lab 03/09/22 1209 03/10/22 0610  AST 24 26  ALT 25 25  ALKPHOS 165* 157*  BILITOT 0.7 0.6  PROT 8.7* 7.2  ALBUMIN 3.1* 2.2*   No results for input(s): "LIPASE", "AMYLASE" in the last 168 hours. No results for input(s): "AMMONIA" in the last 168 hours. Coagulation profile No results for input(s): "INR", "PROTIME" in the last 168 hours. COVID-19 Labs  No results for input(s): "DDIMER", "FERRITIN", "LDH", "CRP" in the last 72 hours.  Lab Results  Component Value Date   SARSCOV2NAA Not Detected 09/16/2020   SARSCOV2NAA NEGATIVE 07/12/2020   SARSCOV2NAA NEGATIVE 03/23/2020    CBC: Recent Labs  Lab 03/09/22 1241 03/10/22 0610  WBC 16.8* 12.0*  NEUTROABS 12.4*  --   HGB 10.9* 10.2*  HCT 33.0* 30.9*  MCV 68.3* 69.8*  PLT 350 327   Cardiac Enzymes: No results for input(s): "CKTOTAL", "CKMB", "CKMBINDEX", "TROPONINI" in the last 168 hours. BNP (last 3 results) No results for input(s): "PROBNP" in the last 8760 hours. CBG: No results for input(s): "GLUCAP" in the last 168 hours. D-Dimer: No results for input(s): "DDIMER" in the last 72 hours. Hgb A1c: Recent Labs    03/09/22 1430  HGBA1C 5.3   Lipid Profile: No results for input(s): "CHOL", "HDL", "LDLCALC", "TRIG", "CHOLHDL", "LDLDIRECT" in the last 72 hours. Thyroid function studies: No results for input(s): "TSH", "T4TOTAL", "T3FREE", "THYROIDAB" in the last 72 hours.  Invalid input(s): "FREET3" Anemia work up: No results for input(s):  "VITAMINB12", "FOLATE", "FERRITIN", "TIBC", "IRON", "RETICCTPCT" in the last 72 hours. Sepsis Labs: Recent Labs  Lab 03/09/22 1241 03/09/22 1430 03/09/22 1606 03/10/22 0610  WBC 16.8*  --   --  12.0*  LATICACIDVEN  --  0.8 0.7  --    Microbiology Recent Results (from the past 240 hour(s))  Blood culture (routine x 2)     Status: None (Preliminary result)   Collection Time: 03/09/22 12:08 PM   Specimen: BLOOD LEFT FOREARM  Result Value Ref Range Status   Specimen Description BLOOD LEFT FOREARM  Final   Special Requests   Final    Blood Culture adequate volume Performed at [facility], [address], [place], [place] [address]    Culture PENDING  Incomplete   Report Status PENDING  Incomplete  Blood culture (routine x 2)     Status: None (Preliminary result)  Collection Time: 03/09/22 12:13 PM   Specimen: Left Antecubital; Blood  Result Value Ref Range Status   Specimen Description LEFT ANTECUBITAL  Final   Special Requests   Final    Blood Culture adequate volume Performed at [facility], [address], [place], [place] [address]    Culture PENDING  Incomplete   Report Status PENDING  Incomplete     Medications:    enoxaparin (LOVENOX) injection  40 mg Subcutaneous Q24H   sodium chloride flush  3 mL Intravenous Q12H   Continuous Infusions:  sodium chloride Stopped (03/09/22 1622)   ceFEPime (MAXIPIME) IV 2 g (03/10/22 0603)   lactated ringers 125 mL/hr at 03/09/22 2244   vancomycin        LOS: 1 day   Marinda Elk  [facility]  03/10/2022, 8:11 AM

## 2022-03-10 NOTE — Progress Notes (Signed)
Nurse reached out to [facility] provider Margo Aye, MD to follow up with patient's IV fluids. Order for IV fluids have been discontinued. Primary nurse also wanted to see if patient could have something else for pain. No new orders at this time. Will continue to monitor.

## 2022-03-11 ENCOUNTER — Encounter (HOSPITAL_COMMUNITY): Payer: Self-pay | Admitting: Internal Medicine

## 2022-03-11 DIAGNOSIS — F419 Anxiety disorder, unspecified: Secondary | ICD-10-CM | POA: Diagnosis not present

## 2022-03-11 DIAGNOSIS — K219 Gastro-esophageal reflux disease without esophagitis: Secondary | ICD-10-CM | POA: Diagnosis not present

## 2022-03-11 DIAGNOSIS — L03113 Cellulitis of right upper limb: Secondary | ICD-10-CM | POA: Diagnosis not present

## 2022-03-11 DIAGNOSIS — J4520 Mild intermittent asthma, uncomplicated: Secondary | ICD-10-CM | POA: Diagnosis not present

## 2022-03-11 IMAGING — US US THYROID
1 series · 14 of 25 positions shown · non-contrast
Comparison: None.

CLINICAL DATA: Goiter.

EXAM:
THYROID ULTRASOUND
TECHNIQUE: Ultrasound examination of the thyroid gland and adjacent soft
tissues was performed.

[Series 1: us thyroid · 0.04mm/px · 14 of 41 slices shown]
[im 1/41]
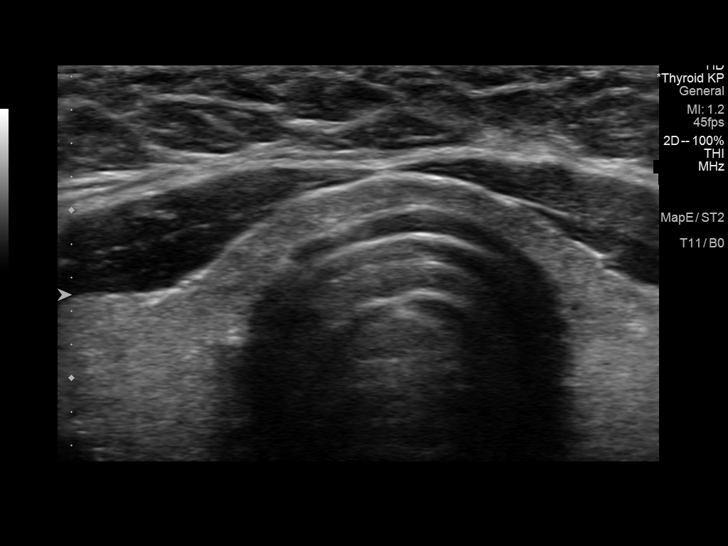
[im 4/41]
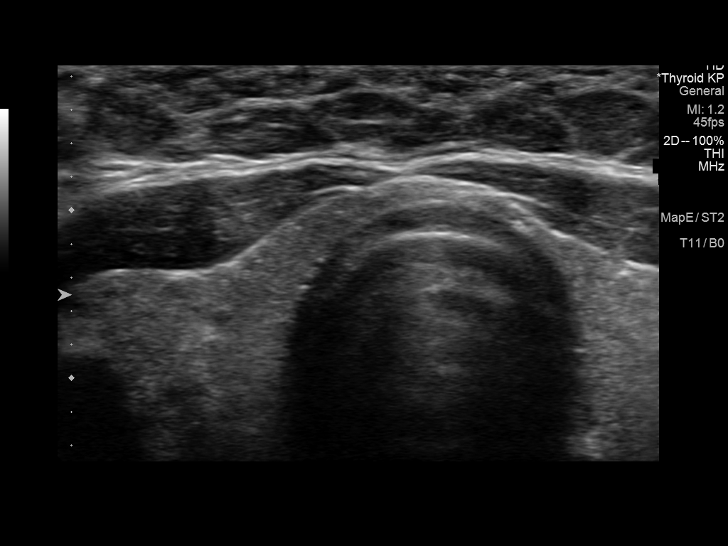
[im 7/41]
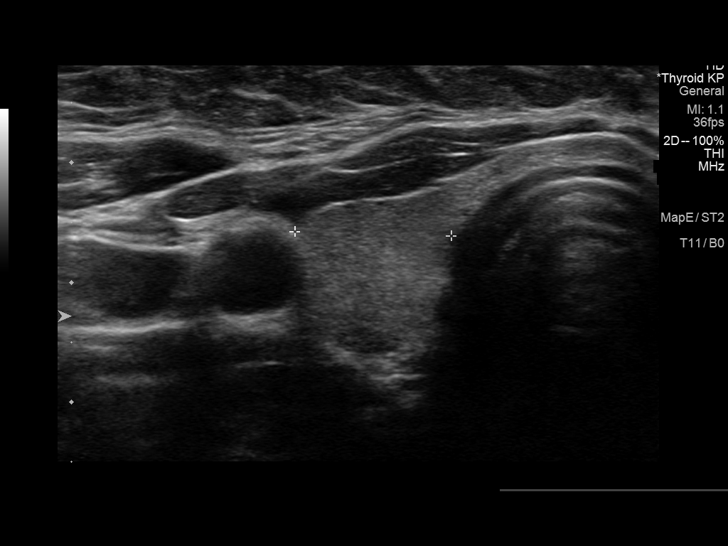
[im 11/41]
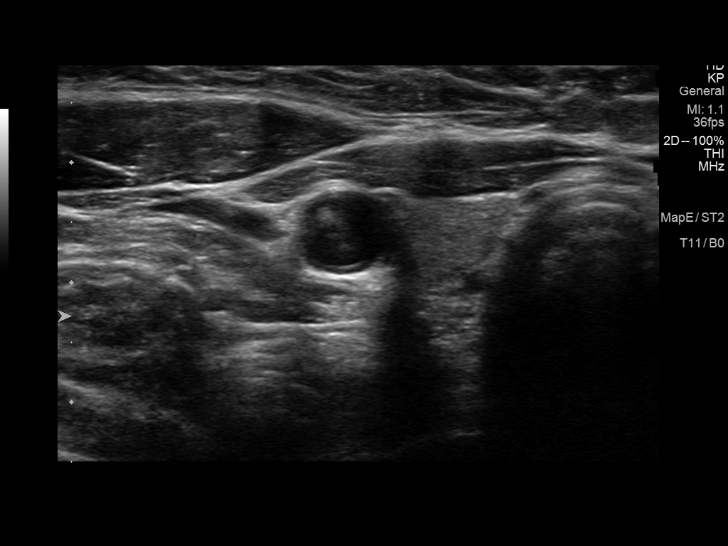
[im 14/41]
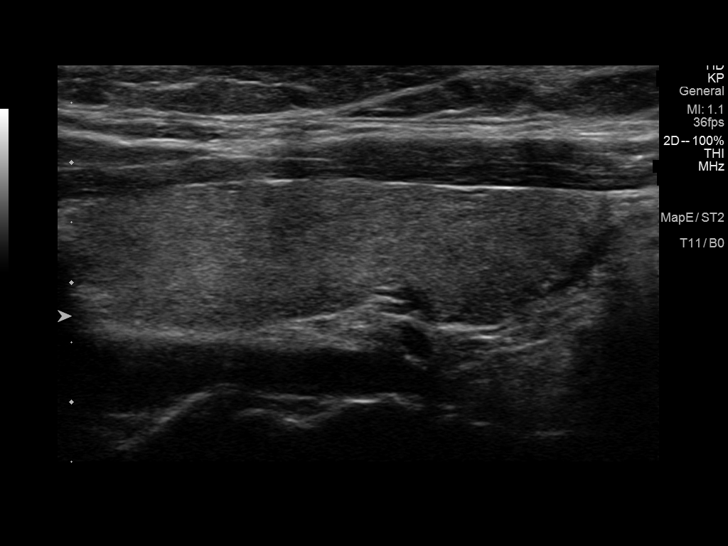
[im 16/41]
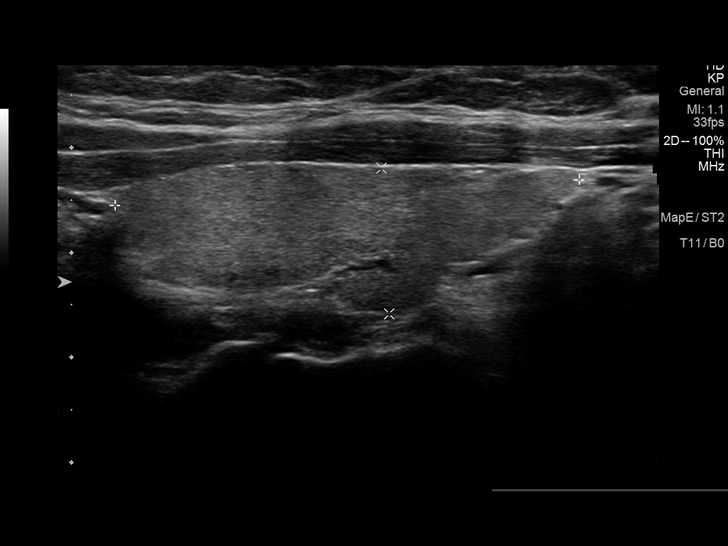
[im 19/41]
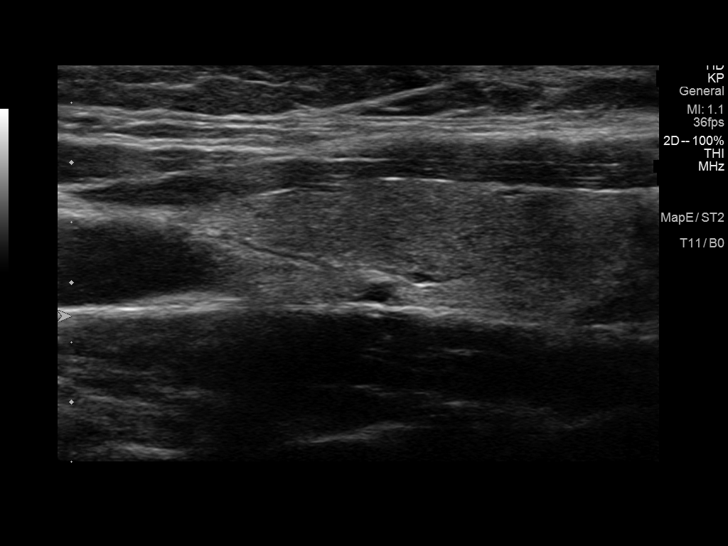
[im 22/41]
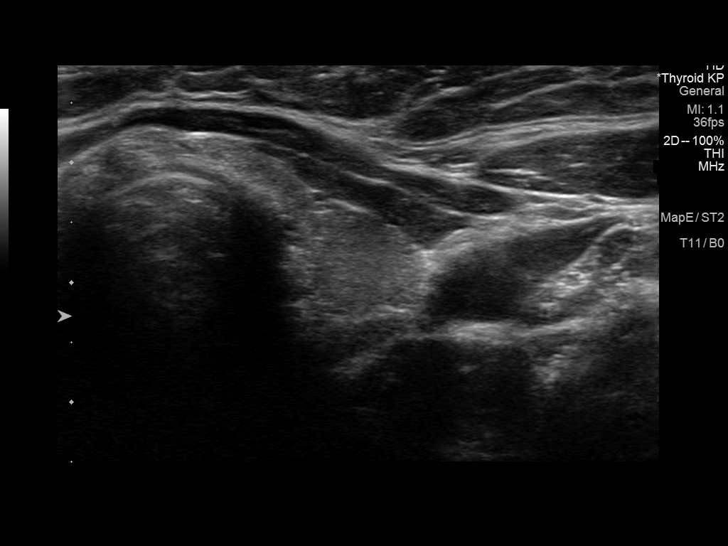
[im 26/41]
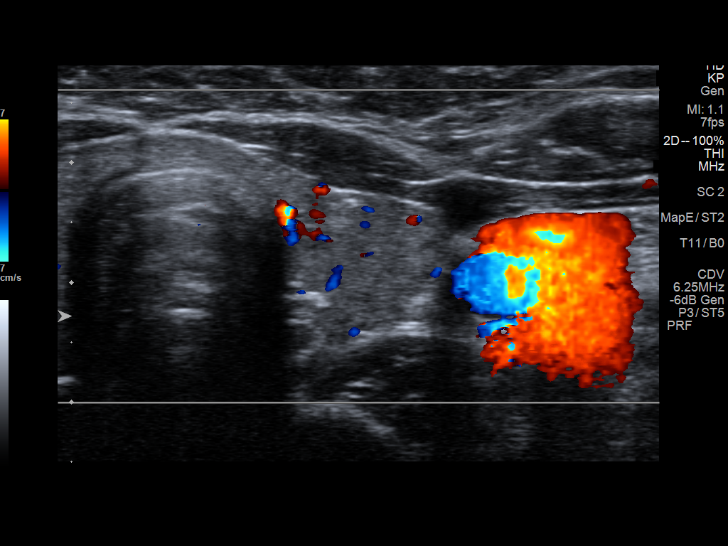
[im 27/41]
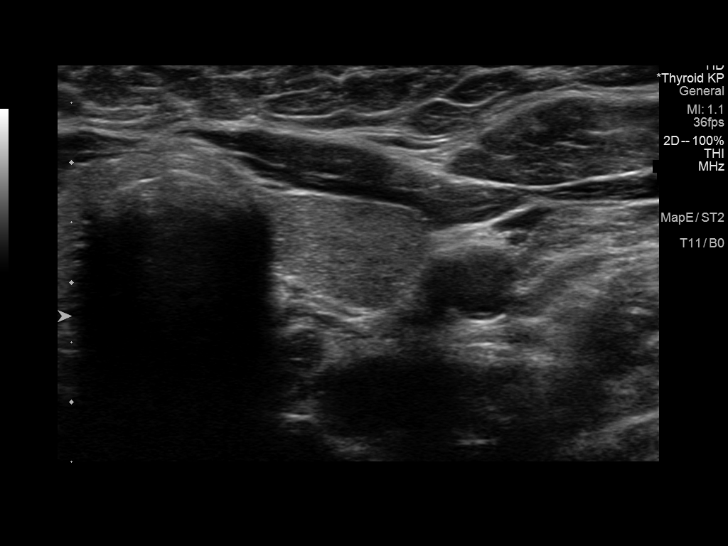
[im 31/41]
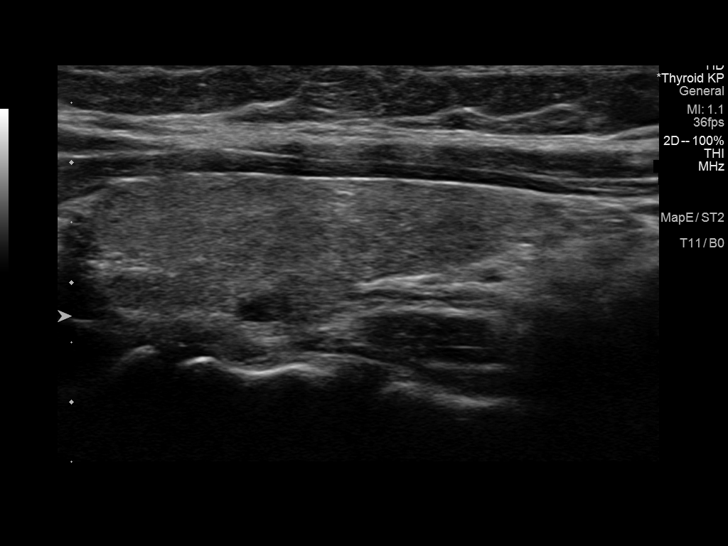
[im 34/41]
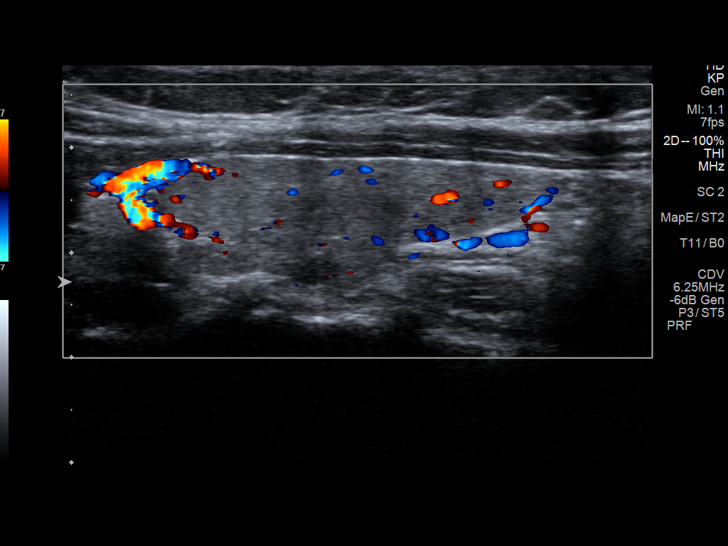
[im 37/41]
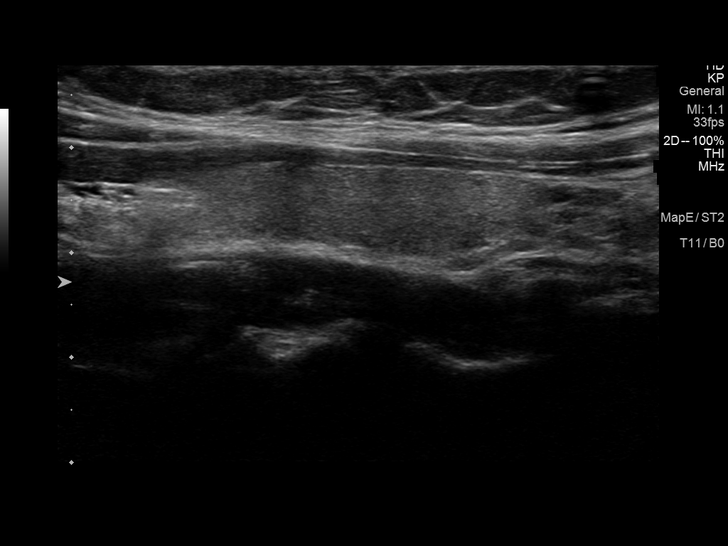
[im 41/41]
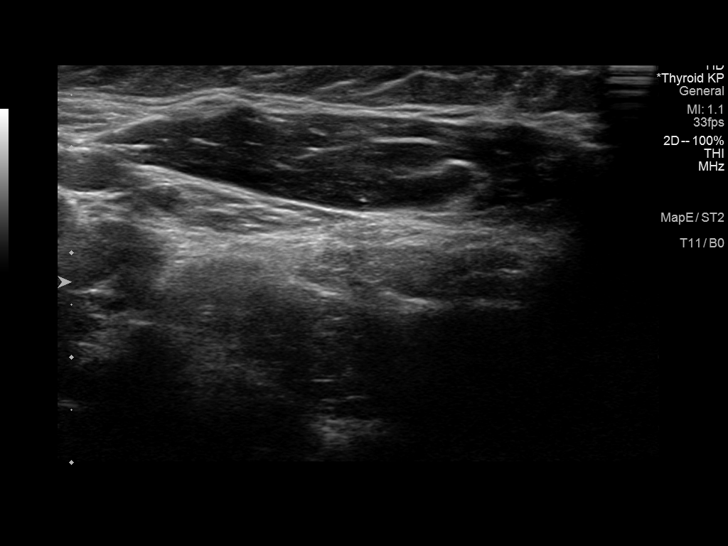

[14 of 25 positions shown; findings below may reference images not displayed]

FINDINGS: Parenchymal Echotexture: Mildly heterogeneous

Isthmus: 0.2 cm

Right lobe: 4.4 x 1.4 x 1.3 cm

Left lobe: 4.0 x 1.2 x 1.3 cm

_________________________________________________________

Estimated total number of nodules >/= 1 cm: 0

Number of spongiform nodules >/=  2 cm not described below (TR1): 0

Number of mixed cystic and solid nodules >/= 1.5 cm not described
below (TR2): 0

_________________________________________________________

4 mm hypoechoic nodule in the posterior left thyroid lobe does not
meet criteria for FNA or imaging surveillance.
IMPRESSION: No significant sonographic abnormality of the thyroid.

The above is in keeping with the ACR TI-RADS recommendations - [HOSPITAL] 4928;[DATE].

## 2022-03-11 MED ORDER — ENOXAPARIN SODIUM 60 MG/0.6ML IJ SOSY
60.0000 mg | PREFILLED_SYRINGE | INTRAMUSCULAR | Status: DC
  Administered 2022-03-11 – 2022-03-13 (×3): 60 mg via SUBCUTANEOUS
  Filled 2022-03-11 (×3): qty 0.6

## 2022-03-11 NOTE — Progress Notes (Signed)
[facility] PROGRESS NOTE    Progress Note  Karina [name]  [id number]:[id number] DOB: 05/25/1981 DOA: 03/09/2022 PCP: Bailey Mech, PA-C     Brief Narrative:   Karina [name] is an 41 y.o. female past medical history significant for asthma biliary dyskinesia fatty liver thalassemia trait, gastric banding and GERD comes in with right upper extremity cellulitis, which did not improve despite 4 days of antibiotics   Assessment/Plan:   Sepsis due to cellulitis of right upper extremity Afebrile, on IV vancomycin and Rocephin Erythema is unchanged continues to be warm and exquisitely tender to touch. Try to keep the arm elevated above heart level, erythema still in the demarcated areas.  Asthma Stable continue inhalers.  Anxiety/depression: Continue Xanax as needed.  PCOS: She is not taking Aldactone  Hyperglycemia: Likely due to infectious etiology A1c of 5.3. Continue to monitor.  H/O laparoscopic adjustable gastric banding Noted.  Iron deficiency anemia/thalassemia trait: Noted.  DVT prophylaxis: lovenox Family Communication:none Status is: Inpatient Remains inpatient appropriate because: Acute upper extremity cellulitis    Code Status:     Code Status Orders  (From admission, onward)           Start     Ordered   03/09/22 1940  Full code  Continuous        03/09/22 1940           Code Status History     Date Active Date Inactive Code Status Order ID Comments User Context   03/24/2020 1154 03/25/2020 0006 Full Code [id number]  Hoover Browns, MD Inpatient   06/20/2017 0050 06/21/2017 1541 Full Code [id number]  Gerrit Heck, CNM Inpatient   06/19/2017 1131 06/20/2017 0050 Full Code [id number]  Clearence Cheek, RN Inpatient   05/13/2014 1350 05/15/2014 1822 Full Code [id number]  Standard, Venus, CNM Inpatient   05/11/2014 1426 05/13/2014 1350 Full Code [id number]  Nigel Bridgeman, CNM Inpatient   04/27/2014 1923 04/29/2014 2232 Full Code [id number]   Nigel Bridgeman, CNM Inpatient      Advance Directive Documentation    Flowsheet Row Most Recent Value  Type of Advance Directive Living will  [needs to be updated, per patient]  Pre-existing out of facility DNR order (yellow form or pink MOST form) --  "MOST" Form in Place? --         IV Access:   Peripheral IV   Procedures and diagnostic studies:   DG Elbow Complete Right  Result Date: 03/09/2022 CLINICAL DATA:  RIGHT arm wound for 5 days, infection, redness and swelling with occasional fever, started antibiotics earlier this week for cellulitis at origin care EXAM: RIGHT ELBOW - COMPLETE 3+ VIEW COMPARISON:  None FINDINGS: Osseous mineralization normal. Joint spaces preserved. No acute fracture, dislocation, or bone destruction. No joint effusion. Diffuse soft tissue swelling RIGHT arm. IMPRESSION: Diffuse soft tissue swelling without acute osseous abnormalities. Electronically Signed   By: Ulyses Southward M.D.   On: 03/09/2022 12:34     Medical Consultants:   None.   Subjective:    Karina [name] pain, analgesics not working.  Objective:    Vitals:   03/10/22 0646 03/10/22 1336 03/10/22 2220 03/11/22 0639  BP: (!) 147/72 113/72 (!) 148/133 126/72  Pulse: 88 77 66 80  Resp: 18 18 18 18   Temp: 98.1 F (36.7 C) 98.6 F (37 C) 97.8 F (36.6 C) 97.6 F (36.4 C)  TempSrc: Oral Oral Oral Oral  SpO2: 95% 100% 100% 96%  Weight:  Height:       SpO2: 96 %   Intake/Output Summary (Last 24 hours) at 03/11/2022 1002 Last data filed at 03/11/2022 9381 Gross per 24 hour  Intake 2289 ml  Output --  Net 2289 ml    Filed Weights   03/09/22 1145  Weight: (!) 145.2 kg    Exam: General exam: In no acute distress. Respiratory system: Good air movement and clear to auscultation. Cardiovascular system: S1 & S2 heard, RRR. No JVD. Gastrointestinal system: Abdomen is nondistended, soft and nontender.  Extremities: No pedal edema. Skin: Still erythematous erythema  is not receding Psychiatry: Judgement and insight appear normal. Mood & affect appropriate.   Data Reviewed:    Labs: Basic Metabolic Panel: Recent Labs  Lab 03/09/22 1241 03/10/22 0610  NA 134* 134*  K 3.5 3.9  CL 104 104  CO2 22 26  GLUCOSE 79 151*  BUN 18 13  CREATININE 0.88 0.86  CALCIUM 9.0 7.9*    GFR Estimated Creatinine Clearance: 124.2 mL/min (by C-G formula based on SCr of 0.86 mg/dL). Liver Function Tests: Recent Labs  Lab 03/09/22 1209 03/10/22 0610  AST 24 26  ALT 25 25  ALKPHOS 165* 157*  BILITOT 0.7 0.6  PROT 8.7* 7.2  ALBUMIN 3.1* 2.2*    No results for input(s): "LIPASE", "AMYLASE" in the last 168 hours. No results for input(s): "AMMONIA" in the last 168 hours. Coagulation profile No results for input(s): "INR", "PROTIME" in the last 168 hours. COVID-19 Labs  No results for input(s): "DDIMER", "FERRITIN", "LDH", "CRP" in the last 72 hours.  Lab Results  Component Value Date   SARSCOV2NAA Not Detected 09/16/2020   SARSCOV2NAA NEGATIVE 07/12/2020   SARSCOV2NAA NEGATIVE 03/23/2020    CBC: Recent Labs  Lab 03/09/22 1241 03/10/22 0610  WBC 16.8* 12.0*  NEUTROABS 12.4*  --   HGB 10.9* 10.2*  HCT 33.0* 30.9*  MCV 68.3* 69.8*  PLT 350 327    Cardiac Enzymes: No results for input(s): "CKTOTAL", "CKMB", "CKMBINDEX", "TROPONINI" in the last 168 hours. BNP (last 3 results) No results for input(s): "PROBNP" in the last 8760 hours. CBG: No results for input(s): "GLUCAP" in the last 168 hours. D-Dimer: No results for input(s): "DDIMER" in the last 72 hours. Hgb A1c: Recent Labs    03/09/22 1430  HGBA1C 5.3    Lipid Profile: No results for input(s): "CHOL", "HDL", "LDLCALC", "TRIG", "CHOLHDL", "LDLDIRECT" in the last 72 hours. Thyroid function studies: No results for input(s): "TSH", "T4TOTAL", "T3FREE", "THYROIDAB" in the last 72 hours.  Invalid input(s): "FREET3" Anemia work up: No results for input(s): "VITAMINB12", "FOLATE",  "FERRITIN", "TIBC", "IRON", "RETICCTPCT" in the last 72 hours. Sepsis Labs: Recent Labs  Lab 03/09/22 1241 03/09/22 1430 03/09/22 1606 03/10/22 0610  WBC 16.8*  --   --  12.0*  LATICACIDVEN  --  0.8 0.7  --     Microbiology Recent Results (from the past 240 hour(s))  Blood culture (routine x 2)     Status: None (Preliminary result)   Collection Time: 03/09/22 12:08 PM   Specimen: BLOOD LEFT FOREARM  Result Value Ref Range Status   Specimen Description BLOOD LEFT FOREARM  Final   Special Requests Blood Culture adequate volume  Final   Culture   Final    NO GROWTH 2 DAYS Performed at [facility], [address] [place], [place] [address]    Report Status PENDING  Incomplete  Blood culture (routine x 2)     Status: None (  Preliminary result)   Collection Time: 03/09/22 12:13 PM   Specimen: Left Antecubital; Blood  Result Value Ref Range Status   Specimen Description LEFT ANTECUBITAL  Final   Special Requests Blood Culture adequate volume  Final   Culture   Final    NO GROWTH 2 DAYS Performed at [facility], [place] [address] [place], [place] [address]    Report Status PENDING  Incomplete     Medications:    enoxaparin (LOVENOX) injection  40 mg Subcutaneous Q24H   pantoprazole  40 mg Oral BID   sodium chloride flush  3 mL Intravenous Q12H   Continuous Infusions:  sodium chloride Stopped (03/09/22 1622)   cefTRIAXone (ROCEPHIN)  IV Stopped (03/10/22 1431)   lactated ringers 50 mL/hr at 03/10/22 2312   vancomycin 1,250 mg (03/10/22 2135)      LOS: 2 days   Charlynne Cousins  [facility]  03/11/2022, 10:02 AM

## 2022-03-12 DIAGNOSIS — F419 Anxiety disorder, unspecified: Secondary | ICD-10-CM | POA: Diagnosis not present

## 2022-03-12 DIAGNOSIS — L03113 Cellulitis of right upper limb: Secondary | ICD-10-CM | POA: Diagnosis not present

## 2022-03-12 DIAGNOSIS — K219 Gastro-esophageal reflux disease without esophagitis: Secondary | ICD-10-CM | POA: Diagnosis not present

## 2022-03-12 DIAGNOSIS — J4520 Mild intermittent asthma, uncomplicated: Secondary | ICD-10-CM | POA: Diagnosis not present

## 2022-03-12 NOTE — Plan of Care (Signed)

## 2022-03-12 NOTE — Progress Notes (Signed)
  Transition of Care [facility]) Screening Note   Patient Details  Name: [name] Date of Birth: 06/02/1981   Transition of Care [facility]) CM/SW Contact:    Otelia Santee, LCSW Phone Number: 03/12/2022, 1:16 PM    Transition of Care Department [facility]) has reviewed patient and no TOC needs have been identified at this time. We will continue to monitor patient advancement through interdisciplinary progression rounds. If new patient transition needs arise, please place a TOC consult.

## 2022-03-12 NOTE — Progress Notes (Signed)
During shift assessment patient states she has not had bowel movement since 03/09/2022. Paged Dr. David Stall for medication to help with bowel movements. Awaiting response.

## 2022-03-12 NOTE — Progress Notes (Addendum)
[facility] PROGRESS NOTE    Progress Note  Karina [name]  ZOX:[id number] DOB: 01/20/1981 DOA: 03/09/2022 PCP: Bailey Mech, PA-C     Brief Narrative:   Karina [name] is an 41 y.o. female past medical history significant for asthma biliary dyskinesia fatty liver thalassemia trait, gastric banding and GERD comes in with right upper extremity cellulitis, which did not improve despite 4 days of antibiotics   Assessment/Plan:   Sepsis due to cellulitis of right upper extremity Afebrile, continue IV vancomycin and Rocephin Continues to have significant erythema has not receded any further from the lines that were drawn on 03/10/2022. There is less swelling still exquisitely to touch and warm. Try to keep the arm elevated above heart level. The area has been demarcated.  Asthma Stable continue inhalers.  Anxiety/depression: Continue Xanax as needed.  PCOS: She is not taking Aldactone  Hyperglycemia: Likely due to infectious etiology A1c of 5.3. Continue to monitor.  H/O laparoscopic adjustable gastric banding Noted.  Iron deficiency anemia/thalassemia trait: Noted.  Severe Morbid obesity: counseling  DVT prophylaxis: lovenox Family Communication:none Status is: Inpatient Remains inpatient appropriate because: Acute upper extremity cellulitis    Code Status:     Code Status Orders  (From admission, onward)           Start     Ordered   03/09/22 1940  Full code  Continuous        03/09/22 1940           Code Status History     Date Active Date Inactive Code Status Order ID Comments User Context   03/24/2020 1154 03/25/2020 0006 Full Code [id number]  Hoover Browns, MD Inpatient   06/20/2017 0050 06/21/2017 1541 Full Code [id number]  Gerrit Heck, CNM Inpatient   06/19/2017 1131 06/20/2017 0050 Full Code [id number]  Clearence Cheek, RN Inpatient   05/13/2014 1350 05/15/2014 1822 Full Code [id number]  Standard, Venus, CNM Inpatient    05/11/2014 1426 05/13/2014 1350 Full Code [id number]  Nigel Bridgeman, CNM Inpatient   04/27/2014 1923 04/29/2014 2232 Full Code [id number]  Nigel Bridgeman, CNM Inpatient      Advance Directive Documentation    Flowsheet Row Most Recent Value  Type of Advance Directive Living will  [needs to be updated, per patient]  Pre-existing out of facility DNR order (yellow form or pink MOST form) --  "MOST" Form in Place? --         IV Access:   Peripheral IV   Procedures and diagnostic studies:   No results found.   Medical Consultants:   None.   Subjective:    Karina [name] pain is better controlled.  Objective:    Vitals:   03/11/22 0639 03/11/22 1349 03/11/22 2134 03/12/22 0531  BP: 126/72 123/77 119/79 118/60  Pulse: 80 73 76 72  Resp: 18 18    Temp: 97.6 F (36.4 C) 97.9 F (36.6 C) 98.3 F (36.8 C) 98.2 F (36.8 C)  TempSrc: Oral Oral Oral Oral  SpO2: 96% 100% 99% 100%  Weight:      Height:       SpO2: 100 %   Intake/Output Summary (Last 24 hours) at 03/12/2022 0947 Last data filed at 03/12/2022 0900 Gross per 24 hour  Intake 2228.21 ml  Output --  Net 2228.21 ml    Filed Weights   03/09/22 1145  Weight: (!) 145.2 kg    Exam: General exam: In no acute distress. Respiratory system: Good  air movement and clear to auscultation. Cardiovascular system: S1 & S2 heard, RRR. No JVD. Gastrointestinal system: Abdomen is nondistended, soft and nontender.  Extremities: No pedal edema. Skin: No rashes, lesions or ulcers Psychiatry: Judgement and insight appear normal. Mood & affect appropriate.   Data Reviewed:    Labs: Basic Metabolic Panel: Recent Labs  Lab 03/09/22 1241 03/10/22 0610  NA 134* 134*  K 3.5 3.9  CL 104 104  CO2 22 26  GLUCOSE 79 151*  BUN 18 13  CREATININE 0.88 0.86  CALCIUM 9.0 7.9*    GFR Estimated Creatinine Clearance: 124.2 mL/min (by C-G formula based on SCr of 0.86 mg/dL). Liver Function Tests: Recent Labs  Lab  03/09/22 1209 03/10/22 0610  AST 24 26  ALT 25 25  ALKPHOS 165* 157*  BILITOT 0.7 0.6  PROT 8.7* 7.2  ALBUMIN 3.1* 2.2*    No results for input(s): "LIPASE", "AMYLASE" in the last 168 hours. No results for input(s): "AMMONIA" in the last 168 hours. Coagulation profile No results for input(s): "INR", "PROTIME" in the last 168 hours. COVID-19 Labs  No results for input(s): "DDIMER", "FERRITIN", "LDH", "CRP" in the last 72 hours.  Lab Results  Component Value Date   SARSCOV2NAA Not Detected 09/16/2020   SARSCOV2NAA NEGATIVE 07/12/2020   SARSCOV2NAA NEGATIVE 03/23/2020    CBC: Recent Labs  Lab 03/09/22 1241 03/10/22 0610  WBC 16.8* 12.0*  NEUTROABS 12.4*  --   HGB 10.9* 10.2*  HCT 33.0* 30.9*  MCV 68.3* 69.8*  PLT 350 327    Cardiac Enzymes: No results for input(s): "CKTOTAL", "CKMB", "CKMBINDEX", "TROPONINI" in the last 168 hours. BNP (last 3 results) No results for input(s): "PROBNP" in the last 8760 hours. CBG: No results for input(s): "GLUCAP" in the last 168 hours. D-Dimer: No results for input(s): "DDIMER" in the last 72 hours. Hgb A1c: Recent Labs    03/09/22 1430  HGBA1C 5.3    Lipid Profile: No results for input(s): "CHOL", "HDL", "LDLCALC", "TRIG", "CHOLHDL", "LDLDIRECT" in the last 72 hours. Thyroid function studies: No results for input(s): "TSH", "T4TOTAL", "T3FREE", "THYROIDAB" in the last 72 hours.  Invalid input(s): "FREET3" Anemia work up: No results for input(s): "VITAMINB12", "FOLATE", "FERRITIN", "TIBC", "IRON", "RETICCTPCT" in the last 72 hours. Sepsis Labs: Recent Labs  Lab 03/09/22 1241 03/09/22 1430 03/09/22 1606 03/10/22 0610  WBC 16.8*  --   --  12.0*  LATICACIDVEN  --  0.8 0.7  --     Microbiology Recent Results (from the past 240 hour(s))  Blood culture (routine x 2)     Status: None (Preliminary result)   Collection Time: 03/09/22 12:08 PM   Specimen: BLOOD LEFT FOREARM  Result Value Ref Range Status   Specimen  Description BLOOD LEFT FOREARM  Final   Special Requests Blood Culture adequate volume  Final   Culture   Final    NO GROWTH 2 DAYS Performed at [facility], [address], [place], [place] [address]    Report Status PENDING  Incomplete  Blood culture (routine x 2)     Status: None (Preliminary result)   Collection Time: 03/09/22 12:13 PM   Specimen: Left Antecubital; Blood  Result Value Ref Range Status   Specimen Description LEFT ANTECUBITAL  Final   Special Requests Blood Culture adequate volume  Final   Culture   Final    NO GROWTH 2 DAYS Performed at [facility], [address], [place], [place] [address]    Report  Status PENDING  Incomplete     Medications:    enoxaparin (LOVENOX) injection  60 mg Subcutaneous Q24H   pantoprazole  40 mg Oral BID   sodium chloride flush  3 mL Intravenous Q12H   Continuous Infusions:  sodium chloride Stopped (03/09/22 1622)   cefTRIAXone (ROCEPHIN)  IV Stopped (03/11/22 1515)   lactated ringers 50 mL/hr at 03/10/22 2312   vancomycin 1,250 mg (03/12/22 0918)      LOS: 3 days   Marinda Elk  [facility]  03/12/2022, 9:47 AM

## 2022-03-12 NOTE — Progress Notes (Signed)
Pharmacy Antibiotic Note  Karina [name] is a 41 y.o. female admitted on 03/09/2022 with cellulitis.  Pharmacy has been consulted for Vanco, Cefepime dosing.  Presents with right arm wound x5 days prior to admit (popped by pt). Patient report intermittent redness/swelling to site as well as a fever. Seen by urgent care and started on antibiotics (Amoxil).  - c/o Started as muscle ache>>rash warm and painful>>rash was spreading and being associated with painful blisters that drained clear yellow liquid.  Denies any pus but also was having high fevers with a Tmax of 102 at home  Plan: Continue ceftriaxone 2g IV q24 hours per MD Continue vancomycin 1250mg  IV q12 hours (eAUC 517 w/ SCr 0.88; Vd 0.5 F/u ability to narrow antibiotics and/or switch to oral therapy  Height: 5\' 5"  (165.1 cm) Weight: (!) 145.2 kg (320 lb) IBW/kg (Calculated) : 57  Temp (24hrs), Avg:98.1 F (36.7 C), Min:97.8 F (36.6 C), Max:98.3 F (36.8 C)  Recent Labs  Lab 03/09/22 1241 03/09/22 1430 03/09/22 1606 03/10/22 0610  WBC 16.8*  --   --  12.0*  CREATININE 0.88  --   --  0.86  LATICACIDVEN  --  0.8 0.7  --     Estimated Creatinine Clearance: 124.2 mL/min (by C-G formula based on SCr of 0.86 mg/dL).    No Known Allergies  Antimicrobials this admission:  Vanco 6/30 >> Cefepime 6/30 >> 7/1 Rocephin >>   Microbiology results:  6/30 BCx: ngtd    9/1, PharmD 03/12/2022 2:47 PM

## 2022-03-13 DIAGNOSIS — D508 Other iron deficiency anemias: Secondary | ICD-10-CM | POA: Diagnosis not present

## 2022-03-13 DIAGNOSIS — K219 Gastro-esophageal reflux disease without esophagitis: Secondary | ICD-10-CM | POA: Diagnosis not present

## 2022-03-13 DIAGNOSIS — L03113 Cellulitis of right upper limb: Secondary | ICD-10-CM | POA: Diagnosis not present

## 2022-03-13 MED ORDER — SULFAMETHOXAZOLE-TRIMETHOPRIM 800-160 MG PO TABS
1.0000 | ORAL_TABLET | Freq: Two times a day (BID) | ORAL | Status: DC
  Administered 2022-03-13 – 2022-03-14 (×3): 1 via ORAL
  Filled 2022-03-13 (×4): qty 1

## 2022-03-13 NOTE — Progress Notes (Signed)
[facility] PROGRESS NOTE    Progress Note  Karina [name]  [id number] DOB: 04-Jan-1981 DOA: 03/09/2022 PCP: Jonathon Bellows, PA-C     Brief Narrative:   Karina [name] is an 41 y.o. female past medical history significant for asthma biliary dyskinesia fatty liver thalassemia trait, gastric banding and GERD comes in with right upper extremity cellulitis, which did not improve despite 4 days of antibiotics   Assessment/Plan:   Sepsis due to cellulitis of right upper extremity Afebrile, continue IV vancomycin and Rocephin Erythema has significantly receding. Discontinue IV vancomycin and Rocephin. We will start her on oral Bactrim.  We will monitor for 24 hours and can discharge on oral antibiotics. Is not exquisitely to touch as it was yesterday.  Asthma Stable continue inhalers.  Anxiety/depression: Continue Xanax as needed.  PCOS: She is not taking Aldactone  Hyperglycemia: Likely due to infectious etiology A1c of 5.3. Continue to monitor.  H/O laparoscopic adjustable gastric banding Noted.  Iron deficiency anemia/thalassemia trait: Noted.  Severe Morbid obesity: counseling  DVT prophylaxis: lovenox Family Communication:none Status is: Inpatient Remains inpatient appropriate because: Acute upper extremity cellulitis    Code Status:     Code Status Orders  (From admission, onward)           Start     Ordered   03/09/22 1940  Full code  Continuous        03/09/22 1940           Code Status History     Date Active Date Inactive Code Status Order ID Comments User Context   03/24/2020 1154 03/25/2020 0006 Full Code [id number]  Waymon Amato, MD Inpatient   06/20/2017 0050 06/21/2017 1541 Full Code [id number]  Gavin Pound, [place] Inpatient   06/19/2017 1131 06/20/2017 0050 Full Code [id number]  Christy Gentles, RN Inpatient   05/13/2014 1350 05/15/2014 1822 Full Code [id number]  Standard, Venus, CNM Inpatient   05/11/2014 1426  05/13/2014 1350 Full Code [id number]  Donnel Saxon, [place] Inpatient   04/27/2014 1923 04/29/2014 2232 Full Code [id number]  Donnel Saxon, CNM Inpatient      Advance Directive Documentation    Flowsheet Row Most Recent Value  Type of Advance Directive Living will  [needs to be updated, per patient]  Pre-existing out of facility DNR order (yellow form or pink MOST form) --  "MOST" Form in Place? --         IV Access:   Peripheral IV   Procedures and diagnostic studies:   No results found.   Medical Consultants:   None.   Subjective:    Karina [name] relates her pain is controlled.  Objective:    Vitals:   03/12/22 0531 03/12/22 1314 03/12/22 2150 03/13/22 0509  BP: 118/60 125/85 (!) 148/87 115/66  Pulse: 72 76 63 60  Resp:  16 20 16   Temp: 98.2 F (36.8 C) 97.8 F (36.6 C) 97.7 F (36.5 C) 97.8 F (36.6 C)  TempSrc: Oral Oral  Oral  SpO2: 100% 97% 99% 98%  Weight:      Height:       SpO2: 98 %   Intake/Output Summary (Last 24 hours) at 03/13/2022 0856 Last data filed at 03/13/2022 0643 Gross per 24 hour  Intake 1295.63 ml  Output 450 ml  Net 845.63 ml    Filed Weights   03/09/22 1145  Weight: (!) 145.2 kg    Exam: General exam: In no acute distress. Respiratory system: Good air  movement and clear to auscultation. Cardiovascular system: S1 & S2 heard, RRR. No JVD. Gastrointestinal system: Abdomen is nondistended, soft and nontender.  Extremities: No pedal edema. Skin: Erythema has significantly improved. Psychiatry: Judgement and insight appear normal. Mood & affect appropriate.   Data Reviewed:    Labs: Basic Metabolic Panel: Recent Labs  Lab 03/09/22 1241 03/10/22 0610  NA 134* 134*  K 3.5 3.9  CL 104 104  CO2 22 26  GLUCOSE 79 151*  BUN 18 13  CREATININE 0.88 0.86  CALCIUM 9.0 7.9*    GFR Estimated Creatinine Clearance: 124.2 mL/min (by C-G formula based on SCr of 0.86 mg/dL). Liver Function Tests: Recent Labs  Lab  03/09/22 1209 03/10/22 0610  AST 24 26  ALT 25 25  ALKPHOS 165* 157*  BILITOT 0.7 0.6  PROT 8.7* 7.2  ALBUMIN 3.1* 2.2*    No results for input(s): "LIPASE", "AMYLASE" in the last 168 hours. No results for input(s): "AMMONIA" in the last 168 hours. Coagulation profile No results for input(s): "INR", "PROTIME" in the last 168 hours. COVID-19 Labs  No results for input(s): "DDIMER", "FERRITIN", "LDH", "CRP" in the last 72 hours.  Lab Results  Component Value Date   SARSCOV2NAA Not Detected 09/16/2020   SARSCOV2NAA NEGATIVE 07/12/2020   SARSCOV2NAA NEGATIVE 03/23/2020    CBC: Recent Labs  Lab 03/09/22 1241 03/10/22 0610  WBC 16.8* 12.0*  NEUTROABS 12.4*  --   HGB 10.9* 10.2*  HCT 33.0* 30.9*  MCV 68.3* 69.8*  PLT 350 327    Cardiac Enzymes: No results for input(s): "CKTOTAL", "CKMB", "CKMBINDEX", "TROPONINI" in the last 168 hours. BNP (last 3 results) No results for input(s): "PROBNP" in the last 8760 hours. CBG: No results for input(s): "GLUCAP" in the last 168 hours. D-Dimer: No results for input(s): "DDIMER" in the last 72 hours. Hgb A1c: No results for input(s): "HGBA1C" in the last 72 hours.  Lipid Profile: No results for input(s): "CHOL", "HDL", "LDLCALC", "TRIG", "CHOLHDL", "LDLDIRECT" in the last 72 hours. Thyroid function studies: No results for input(s): "TSH", "T4TOTAL", "T3FREE", "THYROIDAB" in the last 72 hours.  Invalid input(s): "FREET3" Anemia work up: No results for input(s): "VITAMINB12", "FOLATE", "FERRITIN", "TIBC", "IRON", "RETICCTPCT" in the last 72 hours. Sepsis Labs: Recent Labs  Lab 03/09/22 1241 03/09/22 1430 03/09/22 1606 03/10/22 0610  WBC 16.8*  --   --  12.0*  LATICACIDVEN  --  0.8 0.7  --     Microbiology Recent Results (from the past 240 hour(s))  Blood culture (routine x 2)     Status: None (Preliminary result)   Collection Time: 03/09/22 12:08 PM   Specimen: BLOOD LEFT FOREARM  Result Value Ref Range Status    Specimen Description BLOOD LEFT FOREARM  Final   Special Requests Blood Culture adequate volume  Final   Culture   Final    NO GROWTH 4 DAYS Performed at [facility], [address], [place], [place] [address]    Report Status PENDING  Incomplete  Blood culture (routine x 2)     Status: None (Preliminary result)   Collection Time: 03/09/22 12:13 PM   Specimen: Left Antecubital; Blood  Result Value Ref Range Status   Specimen Description LEFT ANTECUBITAL  Final   Special Requests Blood Culture adequate volume  Final   Culture   Final    NO GROWTH 4 DAYS Performed at [facility], [address], [place], [place] [address]    Report Status PENDING  Incomplete  Medications:    enoxaparin (LOVENOX) injection  60 mg Subcutaneous Q24H   pantoprazole  40 mg Oral BID   sodium chloride flush  3 mL Intravenous Q12H   Continuous Infusions:  sodium chloride Stopped (03/09/22 1622)   cefTRIAXone (ROCEPHIN)  IV 2 g (03/12/22 1344)   vancomycin 1,250 mg (03/12/22 2242)      LOS: 4 days   Marinda Elk  [facility]  03/13/2022, 8:56 AM

## 2022-03-13 NOTE — Consult Note (Signed)
WOC Nurse Consult Note: Patient receiving care in [facility]14. Reason for Consult: Right Upper Extremity with cellulitis and ruptured bullae. Wound type: infectious Pressure Injury POA: Yes/No/NA Measurement: na Wound bed: pink Drainage (amount, consistency, odor) none Periwound: erythematous, however the erythema is receeding. Dressing procedure/placement/frequency: Super saturate the dressing to the RUE with saline or sterile water to remove. Place a Xeroform gauze [place] [phone]) over the wound area. Starting at the elbow, spiral wrap kerlix up to the axilla. Do NOT place tape on the skin.  Monitor the wound area(s) for worsening of condition such as: Signs/symptoms of infection,  Increase in size,  Development of or worsening of odor, Development of pain, or increased pain at the affected locations.  Notify the medical team if any of these develop.  Thank you for the consult.  Discussed plan of care with the patient and bedside nurse.  WOC nurse will not follow at this time.  Please re-consult the WOC team if needed.  Helmut Muster, RN, MSN, CWOCN, CNS-BC, pager [phone]

## 2022-03-14 ENCOUNTER — Other Ambulatory Visit (HOSPITAL_COMMUNITY): Payer: Self-pay

## 2022-03-14 DIAGNOSIS — F320 Major depressive disorder, single episode, mild: Secondary | ICD-10-CM

## 2022-03-14 DIAGNOSIS — K2100 Gastro-esophageal reflux disease with esophagitis, without bleeding: Secondary | ICD-10-CM

## 2022-03-14 DIAGNOSIS — F419 Anxiety disorder, unspecified: Secondary | ICD-10-CM | POA: Diagnosis not present

## 2022-03-14 DIAGNOSIS — L03113 Cellulitis of right upper limb: Secondary | ICD-10-CM | POA: Diagnosis not present

## 2022-03-14 DIAGNOSIS — J4520 Mild intermittent asthma, uncomplicated: Secondary | ICD-10-CM | POA: Diagnosis not present

## 2022-03-14 LAB — COMPREHENSIVE METABOLIC PANEL
ALT: 26 U/L (ref 0–44)
AST: 31 U/L (ref 15–41)
Albumin: 2.5 g/dL — ABNORMAL LOW (ref 3.5–5.0)
Alkaline Phosphatase: 164 U/L — ABNORMAL HIGH (ref 38–126)
Anion gap: 5 (ref 5–15)
BUN: 15 mg/dL (ref 6–20)
CO2: 25 mmol/L (ref 22–32)
Calcium: 8.5 mg/dL — ABNORMAL LOW (ref 8.9–10.3)
Chloride: 108 mmol/L (ref 98–111)
Creatinine, Ser: 0.89 mg/dL (ref 0.44–1.00)
GFR, Estimated: >60 mL/min (ref >60)
Glucose, Bld: 86 mg/dL (ref 70–99)
Potassium: 4.3 mmol/L (ref 3.5–5.1)
Sodium: 138 mmol/L (ref 135–145)
Total Bilirubin: 0.7 mg/dL (ref 0.3–1.2)
Total Protein: 8.2 g/dL — ABNORMAL HIGH (ref 6.5–8.1)

## 2022-03-14 LAB — CBC WITH DIFFERENTIAL/PLATELET
Abs Immature Granulocytes: 0.36 10*3/uL — ABNORMAL HIGH (ref 0.00–0.07)
Basophils Absolute: 0.1 10*3/uL (ref 0.0–0.1)
Basophils Relative: 1 %
Eosinophils Absolute: 0.7 10*3/uL — ABNORMAL HIGH (ref 0.0–0.5)
Eosinophils Relative: 6 %
HCT: 32.8 % — ABNORMAL LOW (ref 36.0–46.0)
Hemoglobin: 10.5 g/dL — ABNORMAL LOW (ref 12.0–15.0)
Immature Granulocytes: 3 %
Lymphocytes Relative: 31 %
Lymphs Abs: 3.5 10*3/uL (ref 0.7–4.0)
MCH: 22.5 pg — ABNORMAL LOW (ref 26.0–34.0)
MCHC: 32 g/dL (ref 30.0–36.0)
MCV: 70.4 fL — ABNORMAL LOW (ref 80.0–100.0)
Monocytes Absolute: 0.6 10*3/uL (ref 0.1–1.0)
Monocytes Relative: 5 %
Neutro Abs: 6.2 10*3/uL (ref 1.7–7.7)
Neutrophils Relative %: 54 %
Platelets: 349 10*3/uL (ref 150–400)
RBC: 4.66 MIL/uL (ref 3.87–5.11)
RDW: 17.1 % — ABNORMAL HIGH (ref 11.5–15.5)
WBC: 11.3 10*3/uL — ABNORMAL HIGH (ref 4.0–10.5)
nRBC: 0.2 % (ref 0.0–0.2)

## 2022-03-14 LAB — CULTURE, BLOOD (ROUTINE X 2)
Culture: NO GROWTH
Culture: NO GROWTH
Special Requests: ADEQUATE
Special Requests: ADEQUATE

## 2022-03-14 LAB — MAGNESIUM: Magnesium: 2.2 mg/dL (ref 1.7–2.4)

## 2022-03-14 LAB — PHOSPHORUS: Phosphorus: 4.6 mg/dL (ref 2.5–4.6)

## 2022-03-14 MED ORDER — PANTOPRAZOLE SODIUM 40 MG PO TBEC
40.0000 mg | DELAYED_RELEASE_TABLET | Freq: Two times a day (BID) | ORAL | 0 refills | Status: DC
  Filled 2022-03-14: qty 60, 30d supply, fill #0

## 2022-03-14 MED ORDER — SULFAMETHOXAZOLE-TRIMETHOPRIM 800-160 MG PO TABS
1.0000 | ORAL_TABLET | Freq: Two times a day (BID) | ORAL | 0 refills | Status: DC
  Filled 2022-03-14: qty 12, 6d supply, fill #0

## 2022-03-14 NOTE — Discharge Summary (Signed)
Physician Discharge Summary  Karina [name] [id number] DOB: 10-17-1980 DOA: 03/09/2022  PCP: Bailey Mech, PA-C  Admit date: 03/09/2022 Discharge date: 03/14/2022  Time spent: 35 minutes  Recommendations for Outpatient Follow-up:   Sepsis due to cellulitis of right upper extremity Afebrile, continue IV vancomycin and Rocephin Erythema has significantly receding. Discontinue IV vancomycin and Rocephin. We will start her on oral Bactrim.  We will monitor for 24 hours and can discharge on oral antibiotics. Is not exquisitely to touch as it was yesterday. -Patient with complicated cellulitis/sepsis will complete 10-day course of antibiotics   Asthma Stable continue inhalers.   Anxiety/depression: Continue Xanax as needed.  PCOS: She is not taking Aldactone   Hyperglycemia: Likely due to infectious etiology A1c of 5.3. Continue to monitor.   H/O laparoscopic adjustable gastric banding Noted.   Iron deficiency anemia/thalassemia trait: Noted.   Severe Morbid obesity: Counseling - Given that patient has already had gastric banding, will need to adjust nutritional intake and exercise routine.   Discharge Diagnoses:  Principal Problem:   Cellulitis of right upper extremity Active Problems:   Asthma   Anxiety   H/O laparoscopic adjustable gastric banding   Iron deficiency anemia   Depression   Gastroesophageal reflux disease   Discharge Condition: Stable  Diet recommendation: Carb modified  Filed Weights   03/09/22 1145  Weight: (!) 145.2 kg    History of present illness:  41 y.o. female past medical history significant for asthma biliary dyskinesia fatty liver thalassemia trait, gastric banding and GERD comes in with right upper extremity cellulitis, which did not improve despite 4 days of antibiotics  Hospital Course:  See above   Antibiotics Anti-infectives (From admission, onward)    Start     Ordered Stop   03/13/22 1000   sulfamethoxazole-trimethoprim (BACTRIM DS) 800-160 MG per tablet 1 tablet        03/13/22 0901     03/10/22 1400  cefTRIAXone (ROCEPHIN) 2 g in sodium chloride 0.9 % 100 mL IVPB  Status:  Discontinued        03/10/22 0812 03/13/22 0901   03/10/22 1000  vancomycin (VANCOREADY) IVPB 1250 mg/250 mL  Status:  Discontinued        03/09/22 2116 03/13/22 0901   03/09/22 2200  vancomycin (VANCOCIN) IVPB 1000 mg/200 mL premix        03/09/22 2114 03/09/22 2353   03/09/22 2200  ceFEPIme (MAXIPIME) 2 g in sodium chloride 0.9 % 100 mL IVPB  Status:  Discontinued        03/09/22 2114 03/10/22 0812   03/09/22 1345  vancomycin (VANCOCIN) IVPB 1000 mg/200 mL premix        03/09/22 1340 03/09/22 1622         Discharge Exam: Vitals:   03/13/22 0509 03/13/22 1337 03/13/22 1938 03/14/22 0424  BP: 115/66 108/68 123/79 120/76  Pulse: 60 (!) 57 (!) 56 60  Resp: 16 20 18    Temp: 97.8 F (36.6 C) 97.6 F (36.4 C) 97.8 F (36.6 C) 98 F (36.7 C)  TempSrc: Oral Oral Oral Oral  SpO2: 98% 100% 99% 99%  Weight:      Height:        General exam: In no acute distress. Respiratory system: Good air movement and clear to auscultation. Cardiovascular system: S1 & S2 heard, RRR. No JVD. Gastrointestinal system: Abdomen is nondistended, soft and nontender.  Extremities: No pedal edema. Skin: Erythema has significantly improved.  Discharge Instructions   Allergies as  of 03/14/2022   No Known Allergies      Medication List     STOP taking these medications    amoxicillin 500 MG capsule Commonly known as: AMOXIL   cabergoline 0.5 MG tablet Commonly known as: DOSTINEX   Enstilar 0.005-0.064 % Foam Generic drug: Calcipotriene-Betameth Diprop   metFORMIN 1000 MG tablet Commonly known as: GLUCOPHAGE   mupirocin ointment 2 % Commonly known as: BACTROBAN   ondansetron 4 MG tablet Commonly known as: Zofran   Opzelura 1.5 % Crea Generic drug: Ruxolitinib Phosphate   spironolactone 50 MG  tablet Commonly known as: ALDACTONE   tiZANidine 2 MG tablet Commonly known as: ZANAFLEX       TAKE these medications    acetaminophen 500 MG tablet Commonly known as: TYLENOL Take 500 mg by mouth every 6 (six) hours as needed for moderate pain.   ALPRAZolam 0.5 MG tablet Commonly known as: XANAX Take 0.5 mg by mouth 3 (three) times daily as needed for anxiety.   ibuprofen 200 MG tablet Commonly known as: ADVIL Take 200 mg by mouth every 6 (six) hours as needed for moderate pain.   pantoprazole 40 MG tablet Commonly known as: PROTONIX Take 1 tablet (40 mg total) by mouth 2 (two) times daily.   sulfamethoxazole-trimethoprim 800-160 MG tablet Commonly known as: BACTRIM DS Take 1 tablet by mouth every 12 (twelve) hours.   Ventolin HFA 108 (90 Base) MCG/ACT inhaler Generic drug: albuterol Inhale 2 puffs into the lungs every 4 hours as needed for wheezing   Vitamin D (Ergocalciferol) 1.25 MG (50000 UNIT) Caps capsule Commonly known as: DRISDOL Take 1 capsule (50,000 Units total) by mouth every 7 (seven) days.       No Known Allergies    The results of significant diagnostics from this hospitalization (including imaging, microbiology, ancillary and laboratory) are listed below for reference.    Significant Diagnostic Studies: DG Elbow Complete Right  Result Date: 03/09/2022 CLINICAL DATA:  RIGHT arm wound for 5 days, infection, redness and swelling with occasional fever, started antibiotics earlier this week for cellulitis at origin care EXAM: RIGHT ELBOW - COMPLETE 3+ VIEW COMPARISON:  None FINDINGS: Osseous mineralization normal. Joint spaces preserved. No acute fracture, dislocation, or bone destruction. No joint effusion. Diffuse soft tissue swelling RIGHT arm. IMPRESSION: Diffuse soft tissue swelling without acute osseous abnormalities. Electronically Signed   By: Ulyses Southward M.D.   On: 03/09/2022 12:34    Microbiology: Recent Results (from the past 240 hour(s))   Blood culture (routine x 2)     Status: None   Collection Time: 03/09/22 12:08 PM   Specimen: BLOOD LEFT FOREARM  Result Value Ref Range Status   Specimen Description BLOOD LEFT FOREARM  Final   Special Requests Blood Culture adequate volume  Final   Culture   Final    NO GROWTH 5 DAYS Performed at [facility], [address] [place], [place] [address]    Report Status 03/14/2022 FINAL  Final  Blood culture (routine x 2)     Status: None   Collection Time: 03/09/22 12:13 PM   Specimen: Left Antecubital; Blood  Result Value Ref Range Status   Specimen Description LEFT ANTECUBITAL  Final   Special Requests Blood Culture adequate volume  Final   Culture   Final    NO GROWTH 5 DAYS Performed at [facility], [address] [place], [place] [address]    Report Status 03/14/2022 FINAL  Final  Labs: Basic Metabolic Panel: Recent Labs  Lab 03/09/22 1241 03/10/22 0610 03/14/22 0739  NA 134* 134* 138  K 3.5 3.9 4.3  CL 104 104 108  CO2 22 26 25   GLUCOSE 79 151* 86  BUN 18 13 15   CREATININE 0.88 0.86 0.89  CALCIUM 9.0 7.9* 8.5*  MG  --   --  2.2  PHOS  --   --  4.6   Liver Function Tests: Recent Labs  Lab 03/09/22 1209 03/10/22 0610 03/14/22 0739  AST 24 26 31   ALT 25 25 26   ALKPHOS 165* 157* 164*  BILITOT 0.7 0.6 0.7  PROT 8.7* 7.2 8.2*  ALBUMIN 3.1* 2.2* 2.5*   No results for input(s): "LIPASE", "AMYLASE" in the last 168 hours. No results for input(s): "AMMONIA" in the last 168 hours. CBC: Recent Labs  Lab 03/09/22 1241 03/10/22 0610 03/14/22 0739  WBC 16.8* 12.0* 11.3*  NEUTROABS 12.4*  --  6.2  HGB 10.9* 10.2* 10.5*  HCT 33.0* 30.9* 32.8*  MCV 68.3* 69.8* 70.4*  PLT 350 327 349   Cardiac Enzymes: No results for input(s): "CKTOTAL", "CKMB", "CKMBINDEX", "TROPONINI" in the last 168 hours. BNP: BNP (last 3 results) No results for input(s): "BNP" in the last 8760 hours.  ProBNP (last 3 results) No results for input(s): "PROBNP" in  the last 8760 hours.  CBG: No results for input(s): "GLUCAP" in the last 168 hours.     Signed:  , MD [facility]

## 2022-03-14 NOTE — Progress Notes (Signed)
Pt being discharged to home with husband. Discharge instructions and medication education provided to pt.  

## 2022-03-14 NOTE — Consult Note (Signed)
   [facility] CM Inpatient Consult   03/14/2022  [name] [birth year] [id number]   [facility] [[facility]]  Accountable Care Organization [ACO] Patient: Karina [name]  *Remote review and call, patient at [facility] at this time  Primary Care Provider:  Bailey Mech, PA-C   Referral per  [facility] [name] for support.    Spoke with the patient via hospital phone, HIPAA verified with 2 identifiers and explained reason for call. Patient was generous and during call MD entered.  Express to patient for post hospital follow up and she states, "that'll be fine" Encouraged her to view her MyChart.      [name]: Patient will be followed by [facility] RN Care Coordinator.   For additional questions or referrals please contact:   Charlesetta Shanks, RN BSN CCM [facility]  [phone] business mobile phone Toll free office [phone]  Fax number: [phone] [email or web address]

## 2022-03-15 ENCOUNTER — Other Ambulatory Visit: Payer: Self-pay

## 2022-03-15 DIAGNOSIS — L03113 Cellulitis of right upper limb: Secondary | ICD-10-CM

## 2022-03-15 DIAGNOSIS — J4520 Mild intermittent asthma, uncomplicated: Secondary | ICD-10-CM

## 2022-03-15 NOTE — Patient Outreach (Signed)
Received a hospital discharge referral from Victoria Brewer, RN Hospital Liaison. ?I have assigned Carroll Spinks, NP to call for follow up and determine if there are any Case Management needs.  ?  ?Essa Wenk, CBCS, CMAA ?[facility] Care Management Assistant ?[facility] Care Management ?[phone]   ?

## 2022-03-16 ENCOUNTER — Other Ambulatory Visit (HOSPITAL_COMMUNITY): Payer: Self-pay

## 2022-03-16 DIAGNOSIS — Z0000 Encounter for general adult medical examination without abnormal findings: Secondary | ICD-10-CM | POA: Diagnosis not present

## 2022-03-16 DIAGNOSIS — A630 Anogenital (venereal) warts: Secondary | ICD-10-CM | POA: Diagnosis not present

## 2022-03-16 MED ORDER — ALBUTEROL SULFATE HFA 108 (90 BASE) MCG/ACT IN AERS
2.0000 | INHALATION_SPRAY | RESPIRATORY_TRACT | 11 refills | Status: DC | PRN
  Filled 2022-03-16: qty 18, 17d supply, fill #0
  Filled 2022-09-05: qty 18, 17d supply, fill #1
  Filled 2023-02-19 – 2023-02-21 (×2): qty 18, 17d supply, fill #2

## 2022-03-16 MED ORDER — IMIQUIMOD 5 % EX CREA
TOPICAL_CREAM | CUTANEOUS | 3 refills | Status: AC
  Filled 2022-03-16: qty 24, 56d supply, fill #0
  Filled 2023-02-19 – 2023-02-21 (×2): qty 24, 56d supply, fill #1

## 2022-03-16 MED ORDER — TIZANIDINE HCL 2 MG PO TABS
2.0000 mg | ORAL_TABLET | Freq: Three times a day (TID) | ORAL | 2 refills | Status: DC | PRN
  Filled 2022-03-16: qty 60, 10d supply, fill #0
  Filled 2022-09-05: qty 60, 10d supply, fill #1
  Filled 2023-02-19 – 2023-02-21 (×2): qty 60, 10d supply, fill #2

## 2022-03-16 MED ORDER — ALPRAZOLAM 0.5 MG PO TABS
0.5000 mg | ORAL_TABLET | Freq: Every day | ORAL | 0 refills | Status: DC | PRN
  Filled 2022-03-16: qty 30, 30d supply, fill #0

## 2022-03-16 MED ORDER — FUROSEMIDE 20 MG PO TABS
20.0000 mg | ORAL_TABLET | ORAL | 3 refills | Status: AC
Start: 2022-03-16 — End: ?
  Filled 2022-03-16: qty 45, 90d supply, fill #0
  Filled 2022-09-05: qty 45, 90d supply, fill #1

## 2022-03-16 MED ORDER — IMIQUIMOD 5 % EX CREA
1.0000 | TOPICAL_CREAM | Freq: Every evening | CUTANEOUS | 3 refills | Status: DC
  Filled 2022-03-16: qty 24, 24d supply, fill #0

## 2022-03-20 ENCOUNTER — Other Ambulatory Visit: Payer: Self-pay | Admitting: *Deleted

## 2022-03-20 NOTE — Patient Outreach (Signed)
[facility]) Care Management  03/20/2022  Karina [name] 10-04-1980 [id number]  Initial attempt for transition of care unsuccessful, left a message, sent a text and requested a return call.  [name]. [name], MSN, [facility] Gerontological Nurse Practitioner [facility] Care Management [phone]

## 2022-03-21 ENCOUNTER — Ambulatory Visit: Payer: Self-pay | Admitting: *Deleted

## 2022-03-21 NOTE — Patient Outreach (Signed)
[facility]) Care Management  03/21/2022  Karina [name] 1981/03/27 [id number]  Second telephone outreach for post hospitalization assessment and invitation to participate in [facility] Care Management services. No answer but was able to leave a message and requested a return call.  [name]. [name], MSN, [facility] Gerontological Nurse Practitioner [facility] Care Management [phone]

## 2022-03-21 NOTE — Progress Notes (Unsigned)
{  Select_TRH_Note:[id number]} 

## 2022-03-22 ENCOUNTER — Encounter: Payer: Self-pay | Admitting: *Deleted

## 2022-03-22 ENCOUNTER — Ambulatory Visit: Payer: Self-pay | Admitting: *Deleted

## 2022-03-22 NOTE — Patient Outreach (Addendum)
[facility]) Care Management  03/22/2022  [name] 03-26-1981 [id number]  Third outreach for transition of care for [facility] patient unsuccessful, left a third message requested a return call. Also called her sister, Lauris Chroman, and left a message with her. Will send unsuccessful letter.  [name]. [name], MSN, GNP-BC Gerontological Nurse Practitioner [facility] Care Management [phone]  Addendum: Mrs. [name] returned my call:   Transition of care questions done. Pt is post hospitalization for cellulitis of R arm and sepsis.   Pt has had f/u appt with MD and has another f/u next Tuesday for assessment of her arm wound and to find out if she is cleared to return to work. Her husband is helping her with her wound care since her infection was in her upper R arm. Together they do wound care twice a day: clean with saline, pat dry, apply vaseline guaze and neosporin on drying scabs, they cut peeling skin off as directed by her MD. The wound is mostly closed now. Goal is to prevent reinfection. She has completed her prescribed antibiotic.  We also discussed other health issues she thought we may address and we agree on working on education regarding diet, carb counting. She is pre-diabetic, A1C 5.3. Her weight is 320#.  NP to send diet information and will call to discuss her health status and discuss educational material in 2 weeks.  [name]. [name], MSN, [facility] Gerontological Nurse Practitioner [facility] Care Management [phone]

## 2022-03-22 NOTE — Addendum Note (Signed)
Addended by: Almetta Lovely on: 03/22/2022 05:22 PM   Modules accepted: Orders

## 2022-03-26 ENCOUNTER — Other Ambulatory Visit: Payer: Self-pay | Admitting: Obstetrics and Gynecology

## 2022-03-26 DIAGNOSIS — Z1231 Encounter for screening mammogram for malignant neoplasm of breast: Secondary | ICD-10-CM

## 2022-03-27 DIAGNOSIS — L03113 Cellulitis of right upper limb: Secondary | ICD-10-CM | POA: Diagnosis not present

## 2022-03-30 DIAGNOSIS — Z0000 Encounter for general adult medical examination without abnormal findings: Secondary | ICD-10-CM | POA: Diagnosis not present

## 2022-04-05 ENCOUNTER — Ambulatory Visit
Admission: RE | Admit: 2022-04-05 | Discharge: 2022-04-05 | Disposition: A | Discharge status: Home / Self Care | Payer: 59 | Source: Ambulatory Visit | Attending: Obstetrics and Gynecology | Admitting: Obstetrics and Gynecology

## 2022-04-05 DIAGNOSIS — Z1231 Encounter for screening mammogram for malignant neoplasm of breast: Secondary | ICD-10-CM | POA: Diagnosis not present

## 2022-04-11 ENCOUNTER — Other Ambulatory Visit: Payer: Self-pay | Admitting: Physician Assistant

## 2022-04-11 ENCOUNTER — Other Ambulatory Visit (HOSPITAL_COMMUNITY): Payer: Self-pay

## 2022-04-11 DIAGNOSIS — L309 Dermatitis, unspecified: Secondary | ICD-10-CM

## 2022-04-11 MED ORDER — ENSTILAR 0.005-0.064 % EX FOAM
1.0000 "application " | Freq: Every day | CUTANEOUS | 0 refills | Status: DC
  Filled 2022-04-11: qty 60, 30d supply, fill #0

## 2022-04-13 ENCOUNTER — Encounter: Payer: Self-pay | Admitting: *Deleted

## 2022-04-13 ENCOUNTER — Other Ambulatory Visit: Payer: Self-pay | Admitting: *Deleted

## 2022-04-13 DIAGNOSIS — L03113 Cellulitis of right upper limb: Secondary | ICD-10-CM | POA: Diagnosis not present

## 2022-04-13 DIAGNOSIS — M79601 Pain in right arm: Secondary | ICD-10-CM | POA: Diagnosis not present

## 2022-04-13 NOTE — Patient Outreach (Signed)
[facility]) Care Management  04/13/2022  Arther Abbott Nov 30, 1980 [id number]  [facility] patient, outreached for follow up regarding cellulitis of her R arm. No answer, will reschedule for another day.  [name]. [name], MSN, [facility] Gerontological Nurse Practitioner [facility] Care Management [phone]

## 2022-04-16 ENCOUNTER — Ambulatory Visit: Payer: Self-pay | Admitting: *Deleted

## 2022-04-16 NOTE — Patient Outreach (Signed)
  Care Coordination   04/16/2022 Name: Karina [name] MRN: [id number] DOB: 06/28/1981   Care Coordination Outreach Attempts:  An unsuccessful telephone outreach was attempted today to offer the patient information about available care coordination services as a benefit of their health plan.  Pt has been previously engaged for Transition of care calls.  Follow Up Plan:  Additional outreach attempts will be made to offer the patient care coordination information and services.   Encounter Outcome:  No Answer  Care Coordination Interventions Activated:  No   Care Coordination Interventions:  No, not indicated    Sebron Mcmahill C. [name], MSN, [facility] Gerontological Nurse Practitioner [facility] [place] [facility] Care Management [phone]

## 2022-04-18 ENCOUNTER — Ambulatory Visit: Payer: Self-pay | Admitting: *Deleted

## 2022-04-18 ENCOUNTER — Encounter (INDEPENDENT_AMBULATORY_CARE_PROVIDER_SITE_OTHER): Payer: Self-pay

## 2022-04-18 NOTE — Patient Outreach (Signed)
[facility]) Care Management  04/18/2022  Karina [name] 1980/10/12 [id number]   Third outreach to follow up on pt status. Left message stating this would be last call, but would like to know how pt is if she could call or text me. Closing case.  [name]. [name], MSN, [facility] Gerontological Nurse Practitioner [facility] Care Management [phone]

## 2022-04-20 ENCOUNTER — Ambulatory Visit: Payer: 59

## 2022-04-20 ENCOUNTER — Ambulatory Visit (INDEPENDENT_AMBULATORY_CARE_PROVIDER_SITE_OTHER): Payer: 59

## 2022-04-20 ENCOUNTER — Ambulatory Visit: Payer: 59 | Admitting: Podiatry

## 2022-04-20 DIAGNOSIS — M79672 Pain in left foot: Secondary | ICD-10-CM | POA: Diagnosis not present

## 2022-04-20 DIAGNOSIS — S90852A Superficial foreign body, left foot, initial encounter: Secondary | ICD-10-CM | POA: Diagnosis not present

## 2022-04-20 NOTE — Progress Notes (Unsigned)
Subjective:   Patient ID: Karina [name], female   DOB: 41 y.o.   MRN: [id number]   HPI Chief Complaint  Patient presents with   Foot Injury    Pt stepped on a stick with the left foot about 7 mths ago,  pt has been having pain, pain is under the 4th and 5th toes,  sharp pain when walking, Xrays taken today     She saw another DPM was told she had foot dry skin and was told to use cream but no other treatment. She states that she stepped on the sick it did break the skin. She had a small hole in the foot. After it closed it was swollen for a long time. No redness. No fevers or chills.    ROS      Objective:  Physical Exam  ***     Assessment:  ***     Plan:  ***

## 2022-05-02 ENCOUNTER — Other Ambulatory Visit: Payer: Self-pay | Admitting: Podiatry

## 2022-05-02 DIAGNOSIS — S90852A Superficial foreign body, left foot, initial encounter: Secondary | ICD-10-CM

## 2022-05-03 ENCOUNTER — Encounter: Payer: Self-pay | Admitting: Podiatry

## 2022-05-04 DIAGNOSIS — H5213 Myopia, bilateral: Secondary | ICD-10-CM | POA: Diagnosis not present

## 2022-05-07 ENCOUNTER — Telehealth: Payer: Self-pay

## 2022-05-07 ENCOUNTER — Other Ambulatory Visit: Payer: Self-pay | Admitting: Podiatry

## 2022-05-07 DIAGNOSIS — S90852A Superficial foreign body, left foot, initial encounter: Secondary | ICD-10-CM

## 2022-05-08 NOTE — Telephone Encounter (Signed)
No further notes are needed  

## 2022-05-11 ENCOUNTER — Ambulatory Visit
Admission: RE | Admit: 2022-05-11 | Discharge: 2022-05-11 | Disposition: A | Discharge status: Home / Self Care | Payer: 59 | Source: Ambulatory Visit | Attending: Podiatry | Admitting: Podiatry

## 2022-05-11 DIAGNOSIS — M79605 Pain in left leg: Secondary | ICD-10-CM | POA: Diagnosis not present

## 2022-05-11 DIAGNOSIS — S90852A Superficial foreign body, left foot, initial encounter: Secondary | ICD-10-CM

## 2022-05-29 ENCOUNTER — Encounter: Payer: Self-pay | Admitting: Podiatry

## 2022-05-30 ENCOUNTER — Other Ambulatory Visit: Payer: Self-pay | Admitting: Podiatry

## 2022-05-30 DIAGNOSIS — S90852A Superficial foreign body, left foot, initial encounter: Secondary | ICD-10-CM

## 2022-05-30 NOTE — Telephone Encounter (Signed)
I have ordered a MRI if you can please follow up on this. Thanks.

## 2022-06-30 ENCOUNTER — Ambulatory Visit
Admission: RE | Admit: 2022-06-30 | Discharge: 2022-06-30 | Disposition: A | Discharge status: Home / Self Care | Payer: 59 | Source: Ambulatory Visit | Attending: Podiatry | Admitting: Podiatry

## 2022-06-30 DIAGNOSIS — S90852A Superficial foreign body, left foot, initial encounter: Secondary | ICD-10-CM

## 2022-06-30 DIAGNOSIS — R600 Localized edema: Secondary | ICD-10-CM | POA: Diagnosis not present

## 2022-07-05 ENCOUNTER — Emergency Department (HOSPITAL_COMMUNITY)
Admission: EM | Admit: 2022-07-05 | Discharge: 2022-07-06 | Discharge status: Against Medical Advice | Payer: 59 | Attending: Emergency Medicine | Admitting: Emergency Medicine

## 2022-07-05 ENCOUNTER — Other Ambulatory Visit: Payer: Self-pay

## 2022-07-05 ENCOUNTER — Encounter (HOSPITAL_COMMUNITY): Payer: Self-pay | Admitting: Emergency Medicine

## 2022-07-05 DIAGNOSIS — R1013 Epigastric pain: Secondary | ICD-10-CM | POA: Diagnosis not present

## 2022-07-05 DIAGNOSIS — R079 Chest pain, unspecified: Secondary | ICD-10-CM | POA: Diagnosis not present

## 2022-07-05 DIAGNOSIS — R0789 Other chest pain: Secondary | ICD-10-CM | POA: Diagnosis not present

## 2022-07-05 DIAGNOSIS — R160 Hepatomegaly, not elsewhere classified: Secondary | ICD-10-CM | POA: Diagnosis not present

## 2022-07-05 DIAGNOSIS — K838 Other specified diseases of biliary tract: Secondary | ICD-10-CM | POA: Diagnosis not present

## 2022-07-05 DIAGNOSIS — Z5321 Procedure and treatment not carried out due to patient leaving prior to being seen by health care provider: Secondary | ICD-10-CM | POA: Diagnosis not present

## 2022-07-05 DIAGNOSIS — Z9884 Bariatric surgery status: Secondary | ICD-10-CM | POA: Diagnosis not present

## 2022-07-05 DIAGNOSIS — M5136 Other intervertebral disc degeneration, lumbar region: Secondary | ICD-10-CM | POA: Diagnosis not present

## 2022-07-05 MED ORDER — FENTANYL CITRATE PF 50 MCG/ML IJ SOSY
50.0000 ug | PREFILLED_SYRINGE | Freq: Once | INTRAMUSCULAR | Status: AC
  Administered 2022-07-06: 50 ug via INTRAVENOUS
  Filled 2022-07-05: qty 1

## 2022-07-05 NOTE — ED Triage Notes (Signed)
Patient with epigastric pain, tender upon palpation, radiates to her back and into her neck.  Patient has a history of bariatric surgery, has a lap band.  Patient ate pizza tonight.  Patient's pain started after she ate around 10pm.  No nausea or vomiting.

## 2022-07-05 NOTE — ED Provider Triage Note (Addendum)
Emergency Medicine Provider Triage Evaluation Note  Loura Halt , a 41 y.o. female  was evaluated in triage.  Pt complains of sharp epigastric pain radiating to back with posterior neck pain and L arm pain. Symptoms began around 2200 tonight. No N/V. Hx of lap band surgery, cholecystectomy, right salpingectomy. Tried tums w/o relief. Given 324mg  ASA by EMS prior to arrival which made her pain worse. Endorses FHx of ACS in parents and siblings around age 45.  Review of Systems  Positive: As above Negative: As above  Physical Exam  There were no vitals taken for this visit. Gen:   Awake, no distress. Frequent belching. Resp:  Normal effort  MSK:   Moves extremities without difficulty  Other:  Appears uncomfortable. Epigastric TTP on exam. Abdomen soft, obese w/o peritoneal signs.  Medical Decision Making  Medically screening exam initiated at 11:21 PM.  Appropriate orders placed.  Loura Halt was informed that the remainder of the evaluation will be completed by another provider, this initial triage assessment does not replace that evaluation, and the importance of remaining in the ED until their evaluation is complete.  Epigastric pain - pending labs, CXR. CT ordered given hx of lap band.   Antonietta Breach, PA-C 07/05/22 2332  11:59 PM Patient feeling pre-syncopal in [place]. Brought back to triage. Will repeat vitals. CT changed to CT dissection study given quality and location of pain.   Antonietta Breach, PA-C 07/05/22 2359    Antonietta Breach, PA-C 07/06/22 0002

## 2022-07-06 ENCOUNTER — Emergency Department (HOSPITAL_COMMUNITY): Payer: 59

## 2022-07-06 DIAGNOSIS — K838 Other specified diseases of biliary tract: Secondary | ICD-10-CM | POA: Diagnosis not present

## 2022-07-06 DIAGNOSIS — Z9884 Bariatric surgery status: Secondary | ICD-10-CM | POA: Diagnosis not present

## 2022-07-06 DIAGNOSIS — R1013 Epigastric pain: Secondary | ICD-10-CM | POA: Diagnosis not present

## 2022-07-06 DIAGNOSIS — Z5321 Procedure and treatment not carried out due to patient leaving prior to being seen by health care provider: Secondary | ICD-10-CM | POA: Diagnosis not present

## 2022-07-06 DIAGNOSIS — M5136 Other intervertebral disc degeneration, lumbar region: Secondary | ICD-10-CM | POA: Diagnosis not present

## 2022-07-06 DIAGNOSIS — R079 Chest pain, unspecified: Secondary | ICD-10-CM | POA: Diagnosis not present

## 2022-07-06 DIAGNOSIS — R160 Hepatomegaly, not elsewhere classified: Secondary | ICD-10-CM | POA: Diagnosis not present

## 2022-07-06 LAB — URINALYSIS, ROUTINE W REFLEX MICROSCOPIC
Bilirubin Urine: NEGATIVE
Glucose, UA: NEGATIVE mg/dL
Hgb urine dipstick: NEGATIVE
Ketones, ur: NEGATIVE mg/dL
Leukocytes,Ua: NEGATIVE
Nitrite: NEGATIVE
Protein, ur: NEGATIVE mg/dL
Specific Gravity, Urine: 1.019 (ref 1.005–1.030)
pH: 7 (ref 5.0–8.0)

## 2022-07-06 LAB — CBC WITH DIFFERENTIAL/PLATELET
Abs Immature Granulocytes: 0.06 10*3/uL (ref 0.00–0.07)
Basophils Absolute: 0.1 10*3/uL (ref 0.0–0.1)
Basophils Relative: 0 %
Eosinophils Absolute: 0.4 10*3/uL (ref 0.0–0.5)
Eosinophils Relative: 3 %
HCT: 33 % — ABNORMAL LOW (ref 36.0–46.0)
Hemoglobin: 10.8 g/dL — ABNORMAL LOW (ref 12.0–15.0)
Immature Granulocytes: 0 %
Lymphocytes Relative: 39 %
Lymphs Abs: 5.4 10*3/uL — ABNORMAL HIGH (ref 0.7–4.0)
MCH: 22.7 pg — ABNORMAL LOW (ref 26.0–34.0)
MCHC: 32.7 g/dL (ref 30.0–36.0)
MCV: 69.3 fL — ABNORMAL LOW (ref 80.0–100.0)
Monocytes Absolute: 1.1 10*3/uL — ABNORMAL HIGH (ref 0.1–1.0)
Monocytes Relative: 8 %
Neutro Abs: 6.8 10*3/uL (ref 1.7–7.7)
Neutrophils Relative %: 50 %
Platelets: 314 10*3/uL (ref 150–400)
RBC: 4.76 MIL/uL (ref 3.87–5.11)
RDW: 16.5 % — ABNORMAL HIGH (ref 11.5–15.5)
WBC: 13.8 10*3/uL — ABNORMAL HIGH (ref 4.0–10.5)
nRBC: 0 % (ref 0.0–0.2)

## 2022-07-06 LAB — COMPREHENSIVE METABOLIC PANEL
ALT: 44 U/L (ref 0–44)
AST: 54 U/L — ABNORMAL HIGH (ref 15–41)
Albumin: 2.7 g/dL — ABNORMAL LOW (ref 3.5–5.0)
Alkaline Phosphatase: 233 U/L — ABNORMAL HIGH (ref 38–126)
Anion gap: 11 (ref 5–15)
BUN: 16 mg/dL (ref 6–20)
CO2: 21 mmol/L — ABNORMAL LOW (ref 22–32)
Calcium: 9.2 mg/dL (ref 8.9–10.3)
Chloride: 104 mmol/L (ref 98–111)
Creatinine, Ser: 0.92 mg/dL (ref 0.44–1.00)
GFR, Estimated: >60 mL/min (ref >60)
Glucose, Bld: 115 mg/dL — ABNORMAL HIGH (ref 70–99)
Potassium: 3.6 mmol/L (ref 3.5–5.1)
Sodium: 136 mmol/L (ref 135–145)
Total Bilirubin: 0.9 mg/dL (ref 0.3–1.2)
Total Protein: 8.5 g/dL — ABNORMAL HIGH (ref 6.5–8.1)

## 2022-07-06 LAB — TROPONIN I (HIGH SENSITIVITY)
Troponin I (High Sensitivity): 3 ng/L (ref <18)
Troponin I (High Sensitivity): 3 ng/L (ref <18)

## 2022-07-06 LAB — I-STAT BETA HCG BLOOD, ED (MC, WL, AP ONLY): I-stat hCG, quantitative: <5 m[IU]/mL (ref <5)

## 2022-07-06 LAB — PREGNANCY, URINE: Preg Test, Ur: NEGATIVE

## 2022-07-06 LAB — LIPASE, BLOOD: Lipase: 50 U/L (ref 11–51)

## 2022-07-06 MED ORDER — IOHEXOL 350 MG/ML SOLN
100.0000 mL | Freq: Once | INTRAVENOUS | Status: AC | PRN
  Administered 2022-07-06: 100 mL via INTRAVENOUS

## 2022-07-06 NOTE — ED Notes (Signed)
PT decided to leave AMA PT had an IV in Right wrist. It was removed 8:14

## 2022-07-09 ENCOUNTER — Telehealth: Payer: Self-pay | Admitting: Podiatry

## 2022-07-09 ENCOUNTER — Other Ambulatory Visit (HOSPITAL_COMMUNITY): Payer: Self-pay

## 2022-07-09 MED ORDER — INFLUENZA VAC SPLIT QUAD 0.5 ML IM SUSY
PREFILLED_SYRINGE | INTRAMUSCULAR | 0 refills | Status: DC
  Filled 2022-07-09: qty 0.5, 1d supply, fill #0

## 2022-07-09 NOTE — Telephone Encounter (Signed)
Left message on vm to schedule follow up appointment

## 2022-07-09 NOTE — Telephone Encounter (Signed)
-----   Message from Trula Slade, DPM sent at 07/06/2022  7:38 AM EDT ----- Can you schedule a follow-up? I sent a message to the patient to let her know the results of the MRI as well if she asks. Thanks!

## 2022-07-16 DIAGNOSIS — R932 Abnormal findings on diagnostic imaging of liver and biliary tract: Secondary | ICD-10-CM | POA: Diagnosis not present

## 2022-07-16 DIAGNOSIS — R748 Abnormal levels of other serum enzymes: Secondary | ICD-10-CM | POA: Diagnosis not present

## 2022-07-16 DIAGNOSIS — R079 Chest pain, unspecified: Secondary | ICD-10-CM | POA: Diagnosis not present

## 2022-07-16 DIAGNOSIS — Z09 Encounter for follow-up examination after completed treatment for conditions other than malignant neoplasm: Secondary | ICD-10-CM | POA: Diagnosis not present

## 2022-07-20 DIAGNOSIS — R932 Abnormal findings on diagnostic imaging of liver and biliary tract: Secondary | ICD-10-CM | POA: Diagnosis not present

## 2022-07-20 DIAGNOSIS — R748 Abnormal levels of other serum enzymes: Secondary | ICD-10-CM | POA: Diagnosis not present

## 2022-08-15 ENCOUNTER — Other Ambulatory Visit (HOSPITAL_COMMUNITY): Payer: Self-pay

## 2022-08-15 ENCOUNTER — Ambulatory Visit: Payer: 59 | Attending: Cardiovascular Disease | Admitting: Cardiovascular Disease

## 2022-08-15 ENCOUNTER — Encounter: Payer: Self-pay | Admitting: Cardiovascular Disease

## 2022-08-15 VITALS — BP 122/82 | HR 77 | Ht 65.0 in | Wt 311.8 lb

## 2022-08-15 DIAGNOSIS — G4730 Sleep apnea, unspecified: Secondary | ICD-10-CM

## 2022-08-15 DIAGNOSIS — D509 Iron deficiency anemia, unspecified: Secondary | ICD-10-CM

## 2022-08-15 DIAGNOSIS — R0989 Other specified symptoms and signs involving the circulatory and respiratory systems: Secondary | ICD-10-CM | POA: Diagnosis not present

## 2022-08-15 DIAGNOSIS — Z9884 Bariatric surgery status: Secondary | ICD-10-CM | POA: Diagnosis not present

## 2022-08-15 DIAGNOSIS — R079 Chest pain, unspecified: Secondary | ICD-10-CM

## 2022-08-15 DIAGNOSIS — J4520 Mild intermittent asthma, uncomplicated: Secondary | ICD-10-CM

## 2022-08-15 DIAGNOSIS — R0789 Other chest pain: Secondary | ICD-10-CM

## 2022-08-15 MED ORDER — METOPROLOL TARTRATE 100 MG PO TABS
100.0000 mg | ORAL_TABLET | Freq: Once | ORAL | 0 refills | Status: DC
  Filled 2022-08-15: qty 1, 1d supply, fill #0

## 2022-08-15 NOTE — Patient Instructions (Addendum)
Medication Instructions:  Your physician recommends that you continue on your current medications as directed. Please refer to the Current Medication list given to you today.  *If you need a refill on your cardiac medications before your next appointment, please call your pharmacy*   Lab Work: Your physician recommends that you return for lab work as soon as possible to have the following labs drawn: CMET, CBC, TSH and Lipid Panel  If you have labs (blood work) drawn today and your tests are completely normal, you will receive your results only by: MyChart Message (if you have MyChart) OR A paper copy in the mail If you have any lab test that is abnormal or we need to change your treatment, we will call you to review the results.   Testing/Procedures: Your physician has requested that you have an echocardiogram. Echocardiography is a painless test that uses sound waves to create images of your heart. It provides your doctor with information about the size and shape of your heart and how well your heart's chambers and valves are working. This procedure takes approximately one hour. There are no restrictions for this procedure. Please do NOT wear cologne, perfume, aftershave, or lotions (deodorant is allowed). Please arrive 15 minutes prior to your appointment time.  Your physician has recommended that you have a sleep study. This test records several body functions during sleep, including: brain activity, eye movement, oxygen and carbon dioxide blood levels, heart rate and rhythm, breathing rate and rhythm, the flow of air through your mouth and nose, snoring, body muscle movements, and chest and belly movement.  Cardiac CT Angiography (CTA), is a special type of CT scan that uses a computer to produce multi-dimensional views of major blood vessels throughout the body. In CT angiography, a contrast material is injected through an IV to help visualize the blood vessels    Follow-Up: At [facility], you and your health needs are our priority.  As part of our continuing mission to provide you with exceptional heart care, we have created designated Provider Care Teams.  These Care Teams include your primary Cardiologist (physician) and Advanced Practice Providers (APPs -  Physician Assistants and Nurse Practitioners) who all work together to provide you with the care you need, when you need it.  We recommend signing up for the patient portal called "MyChart".  Sign up information is provided on this After Visit Summary.  MyChart is used to connect with patients for Virtual Visits (Telemedicine).  Patients are able to view lab/test results, encounter notes, upcoming appointments, etc.  Non-urgent messages can be sent to your provider as well.   To learn more about what you can do with MyChart, go to [email or web address].    Your next appointment:   10/02/2022 at 2:40pm   The format for your next appointment:   In Person  Provider:   Nicki Guadalajara, MD     Other Instructions   Your cardiac CT will be scheduled at one of the below locations:   [facility] [address] [place], [place] [address] [phone]  If scheduled at [facility], please arrive at the [facility] (Entrance C2) of [facility] 30 minutes prior to test start time. You can use the FREE valet parking offered at entrance C (encouraged to control the heart rate for the test)  Proceed to the [facility] Radiology Department (first floor) to check-in and test prep.  All radiology patients and guests should use  entrance C2 at [facility], accessed from [facility] [address], even though the hospital's physical address listed is [address]      Please follow these instructions carefully (unless otherwise directed):   On the Night Before the Test: Be sure to Drink plenty of water. Do not consume any caffeinated/decaffeinated  beverages or chocolate 12 hours prior to your test. Do not take any antihistamines 12 hours prior to your test.   On the Day of the Test: Drink plenty of water until 1 hour prior to the test. Do not eat any food 1 hour prior to test. You may take your regular medications prior to the test.  Take metoprolol (Lopressor) 100mg  two hours prior to test. FEMALES- please wear underwire-free bra if available, avoid dresses & tight clothing      After the Test: Drink plenty of water. After receiving IV contrast, you may experience a mild flushed feeling. This is normal. On occasion, you may experience a mild rash up to 24 hours after the test. This is not dangerous. If this occurs, you can take Benadryl 25 mg and increase your fluid intake. If you experience trouble breathing, this can be serious. If it is severe call 911 IMMEDIATELY. If it is mild, please call our office. If you take any of these medications: Glipizide/Metformin, Avandament, Glucavance, please do not take 48 hours after completing test unless otherwise instructed.  We will call to schedule your test 2-4 weeks out understanding that some insurance companies will need an authorization prior to the service being performed.   For non-scheduling related questions, please contact the cardiac imaging nurse navigator should you have any questions/concerns: Marchia Bond, Cardiac Imaging Nurse Navigator Gordy Clement, Cardiac Imaging Nurse Navigator [facility] Direct Office Dial: [phone]   For scheduling needs, including cancellations and rescheduling, please call [place], [phone].   Important Information About Sugar

## 2022-08-15 NOTE — Progress Notes (Signed)
Cardiology Office Note    Date:  08/26/2022   ID:  Karina [name], [place] [birth year], MRN [id number]  PCP:  Jonathon Bellows, PA-C  Cardiologist:  Shelva Majestic, MD   New cardiology consultation referred by Verdis Frederickson, PA-C for evaluation of chest pain.   History of Present Illness:  Karina [name] is a 41 y.o. female who is followed by [employer], PA-C at [facility] in [place].  Patient has experienced episodic sharp chest pain in July 06, 2022 woke up with chest pain with left arm radiation.  She went to the emergency room and in the triage evaluation note the pain was sharp epigastric rating to her back with posterior neck and left arm pain.  Symptoms began at 22:00 and was unassociated with nausea or vomiting.  She has remote history of lap band surgery, cholecystectomy right cell appendectomy.  Apparently, she left before being seen by the physician.  She underwent CT angiography of her chest abdomen and pelvis which did not reveal any evidence for aortic dissection or aneurysm.  There were no acute findings.  There were morphologic features of the liver suggestive of possible early cirrhosis.  A chest x-ray did not show active cardiopulmonary disease.  Troponins were negative.  White blood count was increased at 13.8.  MCV was low at 69.3 with a hemoglobin of 10.8 and hematocrit of 33.  There was mild transaminase elevation with AST 54 ALT 44 and alkaline phosphatase 231.  Potassium was 3.6.  Ultimately due to the long wait, she left AMA.  She admits significant weight gain of approximately 50 pounds over the last 3 years since her Lap-Band surgery.  When seen by call [facility] it was recommended that she have a nuclear stress test.  She also has a remote history of obstructive sleep apnea which was originally diagnosed in [place].  She was on CPAP remotely but has not been on therapy for at least 5 years.  She was recently  evaluated by her primary provider and is referred for cardiology consultation.  Presently, she is pain-free.  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  She typically works from 6 PM to 6 AM on Monday Tuesday and Wednesday and sleeps from 9 until 2 until she has to get her children.  She admits to fatigability.  She presents for initial evaluation   Past Medical History:  Diagnosis Date   Anemia    takes iron daily   Anxiety    no meds   Asthma    albuterol as needed   Chronic bronchitis    bronchitis x 1 event in her twenties   Complication of anesthesia    decreased respirations with lap band sx   Dysrhythmia     " arrhythmia during endoscopy procedure "    Edema extremities    hx of edema in lower ext.used to take lasix - none since twenties   Gallbladder disease    GERD (gastroesophageal reflux disease)    nexium    Hemoglobin C trait (HCC)    History of chicken pox    HSV (herpes simplex virus) anogenital infection    Neuropathy    feet   Pre-diabetes    Pregnant    svd 06/19/17   Sleep apnea    Does not use cpap nightly   SVD (spontaneous vaginal delivery)    x  2   Wears glasses     Past Surgical History:  Procedure Laterality Date   BIOPSY  07/14/2020   Procedure: BIOPSY;  Surgeon: Juanita Craver, MD;  Location: [facility];  Service: Endoscopy;;   CARPAL TUNNEL RELEASE Bilateral 2008, 2010   CHOLECYSTECTOMY  01/11/2012   Procedure: LAPAROSCOPIC CHOLECYSTECTOMY WITH INTRAOPERATIVE CHOLANGIOGRAM;  Surgeon: Gwenyth Ober, MD;  Location: [facility];  Service: General;  Laterality: N/A;   COLONOSCOPY     COLONOSCOPY WITH PROPOFOL N/A 07/14/2020   Procedure: COLONOSCOPY WITH PROPOFOL;  Surgeon: Juanita Craver, MD;  Location: [facility];  Service: Endoscopy;  Laterality: N/A;   DILATION AND EVACUATION N/A 03/24/2020   Procedure: DILATATION AND EVACUATION;  Surgeon: Waymon Amato, MD;  Location: [facility];  Service: Gynecology;  Laterality: N/A;   LAPAROSCOPIC  BILATERAL SALPINGECTOMY Right 08/14/2017   Procedure: LAPAROSCOPIC RIGHT  SALPINGECTOMY;  Surgeon: Delsa Bern, MD;  Location: [facility];  Service: Gynecology;  Laterality: Right;   LAPAROSCOPIC GASTRIC BANDING     UPPER GI ENDOSCOPY     WISDOM TOOTH EXTRACTION      Current Medications: Outpatient Medications Prior to Visit  Medication Sig Dispense Refill   acetaminophen (TYLENOL) 500 MG tablet Take 1,000 mg by mouth every 6 (six) hours as needed for moderate pain.     albuterol (VENTOLIN HFA) 108 (90 Base) MCG/ACT inhaler Inhale 2 puffs into the lungs every 4 (four) hours as needed for wheezing. 18 g 11   ALPRAZolam (XANAX) 0.5 MG tablet Take 1 tablet (0.5 mg total) by mouth daily as needed for anxiety 30 tablet 0   Calcipotriene-Betameth Diprop (ENSTILAR) 0.005-0.064 % FOAM Apply 1 application topically daily. 60 g 0   furosemide (LASIX) 20 MG tablet Take 1 tablet (20 mg total) by mouth every other day. (Patient taking differently: Take 20 mg by mouth daily as needed for fluid.) 45 tablet 3   imiquimod (ALDARA) 5 % cream Apply topically 3 (three) times a week at bedtime until warts completely disappear up to a maximum of 12 weeks. (Patient not taking: Reported on 08/20/2022) 24 each 3   influenza vac split quadrivalent PF (FLUARIX) 0.5 ML injection Inject into the muscle. 0.5 mL 0   tiZANidine (ZANAFLEX) 2 MG tablet Take 1-2 tablets (2-4 mg total) by mouth 3 (three) times daily as needed. (Patient taking differently: Take 2-4 mg by mouth 3 (three) times daily as needed for muscle spasms.) 60 tablet 2   VENTOLIN HFA 108 (90 Base) MCG/ACT inhaler Inhale 2 puffs into the lungs every 4 hours as needed for wheezing (Patient not taking: Reported on 08/20/2022) 18 g 11   ibuprofen (ADVIL) 200 MG tablet Take 200 mg by mouth every 6 (six) hours as needed for moderate pain.     No facility-administered medications prior to visit.     Allergies:   Patient has no known allergies.   Social History    Socioeconomic History   Marital status: Married    Spouse name: Not on file   Number of children: 1   Years of education: college   Highest education level: Not on file  Occupational History   Not on file  Tobacco Use   Smoking status: Former    Packs/day: 0.25    Years: 1.00    Total pack years: 0.25    Types: Cigars, Cigarettes    Quit date: 09/11/1995    Years since quitting: 26.9   Smokeless tobacco: Never  Vaping Use   Vaping Use: Never  used  Substance and Sexual Activity   Alcohol use: Not Currently    Comment: socially-none since pregnancy - SVD 06/19/17   Drug use: No   Sexual activity: Not Currently    Birth control/protection: None    Comment: 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 Martyn Malay in [place] 1-2 cups of coffee a day    Social Determinants of Health   Financial Resource Strain: Not on file  Food Insecurity: No Food Insecurity (08/19/2022)   Hunger Vital Sign    Worried About Running Out of Food in the Last Year: Never true    Ran Out of Food in the Last Year: Never true  Transportation Needs: No Transportation Needs (08/19/2022)   PRAPARE - Hydrologist (Medical): No    Lack of Transportation (Non-Medical): No  Physical Activity: Not on file  Stress: Not on file  Social Connections: Not on file    Social history is notable that her family [place] from [place]  She has lived in [place].  She is married for 9 years and has 2 children.  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  Family History:  The patient's family history includes Anesthesia problems in her mother; Asthma in her maternal grandmother and sister; Cervical cancer in her maternal aunt; Diabetes in her sister; Heart attack in her mother; Heart disease in an other family member; Hyperlipidemia in an other family  member; Hypertension in her brother, father, mother, and another family member; Kidney disease in an other family member; Liver cancer in her paternal grandfather and paternal uncle; Ovarian cancer in her maternal aunt; Prostate cancer in her maternal uncle and paternal grandfather; Stroke in her maternal grandfather, mother, and another family member; Thyroid disease in an other family member.   Both parents are alive, mother age 75 and had previous heart attack and stroke and has hypertension.  Father is 78 and has hypertension and hyperlipidemia.  She has 3 brothers 1 with asthma and high blood pressure and other with hypertension and hyperlipidemia.  She has 4 sisters all who have thyroid abnormalities and 1 with diabetes.  Her children are 68 and 62 years old.  ROS General: Negative; No fevers, chills, or night sweats;  HEENT: Negative; No changes in vision or hearing, sinus congestion, difficulty swallowing Pulmonary: Asthma Cardiovascular: See HPI GI: History of lap band bariatric surgery; negative; No nausea, vomiting, diarrhea, or abdominal pain GU: Negative; No dysuria, hematuria, or difficulty voiding Musculoskeletal: Negative; no myalgias, joint pain, or weakness Hematologic/Oncology: Negative; no easy bruising, bleeding Endocrine: Negative; no heat/cold intolerance; no diabetes Neuro: Negative; no changes in balance, headaches Skin: Eczema on enstilar Psychiatric: Negative; No behavioral problems, depression Sleep: Remote diagnosis of OSA and temporary use CPAP.  Other comprehensive 14 point system review is negative.   PHYSICAL EXAM:   VS:  BP 122/82   Pulse 77   Ht _0  (1.651 m)   Wt (!) 311 lb 12.8 oz (141.4 kg)   SpO2 (!) 10%   BMI 51.89 kg/m     Repeat blood pressure by me was 118/72  Wt Readings from Last 3 Encounters:  08/19/22 (!) 301 lb 13 oz (136.9 kg)  08/15/22 (!) 311 lb 12.8 oz (141.4 kg)  03/09/22 (!) 320 lb (145.2 kg)  She admits to  a 50 pound weight  gain over the past 3 years  General: Alert, oriented, no distress.  Skin: Eczema HEENT: Normocephalic, atraumatic. Pupils equal round and reactive to light; sclera anicteric; extraocular muscles intact;  Nose without nasal septal hypertrophy Mouth/Parynx benign; Mallinpatti scale 3 Neck: No JVD, no carotid bruits; normal carotid upstroke Lungs: clear to ausculatation and percussion; no wheezing or rales Chest wall: without tenderness to palpitation Heart: PMI not displaced, RRR, s1 s2 normal, 1/6 systolic murmur, no diastolic murmur, no rubs, gallops, thrills, or heaves Abdomen: soft, nontender; no hepatosplenomehaly, BS+; abdominal aorta nontender and not dilated by palpation. Back: no CVA tenderness Pulses 2+ Musculoskeletal: full range of motion, normal strength, no joint deformities Extremities: no clubbing cyanosis or edema, Homan's sign negative  Neurologic: grossly nonfocal; Cranial nerves grossly wnl Psychologic: Normal mood and affect   Studies/Labs Reviewed:   August 15, 2022 ECG (independently read by me) NSR at 77, sinus arrhythmia  July 05, 2022 ECG (independently read by me):  NSR at 73   Recent Labs:    Latest Ref Rng & Units 08/23/2022    4:37 AM 08/22/2022    4:35 AM 08/21/2022   11:09 AM  BMP  Glucose 70 - 99 mg/dL 142  83  97   BUN 6 - 20 mg/dL _0 Creatinine 0.44 - 1.00 mg/dL 0.73  0.87  0.69   Sodium 135 - 145 mmol/L 134  135  130   Potassium 3.5 - 5.1 mmol/L 4.4  3.8  5.2   Chloride 98 - 111 mmol/L 109  106  104   CO2 22 - 32 mmol/L _1 Calcium 8.9 - 10.3 mg/dL 8.5  8.3  8.4         Latest Ref Rng & Units 08/23/2022    4:37 AM 08/22/2022    4:35 AM 08/21/2022   11:09 AM  Hepatic Function  Total Protein 6.5 - 8.1 g/dL 8.3  7.8  9.0    8.1   Albumin 3.5 - 5.0 g/dL 2.6  2.5  2.8    2.5   AST 15 - 41 U/L 90  107  151    153   ALT 0 - 44 U/L 127  135  164    162   Alk Phosphatase 38 - 126 U/L 292  285  339    340    Total Bilirubin 0.3 - 1.2 mg/dL 1.2  1.8  3.9    3.7   Bilirubin, Direct 0.0 - 0.2 mg/dL  0.8  1.6        Latest Ref Rng & Units 08/22/2022    4:35 AM 08/21/2022   11:09 AM 08/20/2022   11:42 AM  CBC  WBC 4.0 - 10.5 K/uL 10.4  9.3  10.0   Hemoglobin 12.0 - 15.0 g/dL 9.8  10.8  11.0   Hematocrit 36.0 - 46.0 % 30.2  33.4  34.2   Platelets 150 - 400 K/uL 312  330  331    Lab Results  Component Value Date   MCV 68.8 (L) 08/22/2022   MCV 69.4 (L) 08/21/2022   MCV 69.1 (L) 08/20/2022   Lab Results  Component Value Date   TSH 2.460 04/24/2017   Lab Results  Component Value Date   HGBA1C 5.3 03/09/2022     BNP    Component Value Date/Time   BNP 27.9 08/19/2022 1429    ProBNP No results found for: "  PROBNP"   Lipid Panel     Component Value Date/Time   CHOL 141 08/20/2022 0444   TRIG 41 08/20/2022 0444   HDL 36 (L) 08/20/2022 0444   CHOLHDL 3.9 08/20/2022 0444   VLDL 8 08/20/2022 0444   LDLCALC 97 08/20/2022 0444     RADIOLOGY: DG ERCP  Result Date: 08/22/2022 CLINICAL DATA:  Choledocholithiasis EXAM: ERCP COMPARISON:  [place] IOC 01/11/2012. CT AP, 07/06/2022. MRCP, 08/21/2022. FLUOROSCOPY: Exposure Index (as provided by the fluoroscopic device): 185 mGy Kerma FINDINGS: Multiple, limited oblique planar images of the RIGHT upper quadrant obtained C-arm. Images demonstrating flexible endoscopy, biliary duct cannulation, sphincterotomy, retrograde cholangiogram and balloon sweep. No over biliary ductal dilation. Multiple common bile duct filling defects are present. Balloon sweep with no evidence of residual. IMPRESSION: Fluoroscopic imaging for ERCP. Multiple CBD filling defects are present on initial images, with successful balloon sweep and no evidence of residual. For complete description of intra procedural findings, please see performing service dictation. Electronically Signed   By: Michaelle Birks M.D.   On: 08/22/2022 12:56   MR ABDOMEN MRCP W WO CONTAST  Result  Date: 08/22/2022 CLINICAL DATA:  Biliary obstruction suspected. EXAM: MRI ABDOMEN WITHOUT AND WITH CONTRAST (INCLUDING MRCP) TECHNIQUE: Multiplanar multisequence MR imaging of the abdomen was performed both before and after the administration of intravenous contrast. Heavily T2-weighted images of the biliary and pancreatic ducts were obtained, and three-dimensional MRCP images were rendered by post processing. CONTRAST:  30m GADAVIST GADOBUTROL 1 MMOL/ML IV SOLN COMPARISON:  CT 07/06/2022 FINDINGS: Portions of exam, especially the MRCP dedicated images, are degraded by patient body habitus and less so motion. Lower chest: Normal heart size without pericardial or pleural effusion. Hepatobiliary: Mild hepatic steatosis. Suspect mild cirrhosis, as evidenced by caudate lobe enlargement and medial segment left liver lobe atrophy. A too small to characterize observation within segment 7 of 1.0 cm on 18/16 demonstrates arterial phase hypoenhancement and relative Iso enhancement on later post-contrast imaging. Cholecystectomy. There is mild intrahepatic biliary duct dilatation, with an index left hepatic duct measuring 7 mm on 15/4. This is new since 05/10/2016. Mild enhancement is suspected surrounding intrahepatic ducts, most apparent within the right hepatic lobe including on 34/20 and 30/24. The common duct is minimally dilated at 11 mm in the porta hepatis on 16/5. Tapers gradually in the region of the pancreatic head. This is also new since 2017. There is an equivocal 3 mm filling defect within the distal common duct only identified on 10/12. Pancreas:  Normal, without mass or ductal dilatation. Spleen:  Mild splenomegaly, 13.1 cm. Adrenals/Urinary Tract: Normal adrenal glands. Normal kidneys, without hydronephrosis. Stomach/Bowel: Lap band.  Normal abdominal bowel loops. Vascular/Lymphatic: Normal caliber of the aorta and branch vessels. Patent portal and splenic veins without evidence of portal venous  hypertension. Prominent porta hepatis nodes are likely related to the suspected cirrhosis. Other:  No ascites. Musculoskeletal: No acute osseous abnormality. 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 IMPRESSION: 1. Patient body habitus and less so motion mildly degraded exam. 2. Cholecystectomy with intrahepatic and less so extrahepatic biliary duct dilatation, new since 2017. Equivocal 3 mm distal common duct stone. Suggestion of mild enhancement surrounding intrahepatic ducts. Consider ERCP to evaluate for subtle choledocholithiasis and/or infectious versus inflammatory cholangitis. 3. Mild hepatic steatosis.  Suspicion of mild cirrhosis. 4. Too small to characterize segment 7 liver lesion does not have suspicious characteristics. If the patient is eventually diagnosed with cirrhosis, this would be characterized as LR 3 and warrant follow-up  MRI at 6 months. 5. Mild splenomegaly Electronically Signed   By: Abigail Miyamoto M.D.   On: 08/22/2022 08:09   MR 3D Recon At Scanner  Result Date: 08/22/2022 CLINICAL DATA:  Biliary obstruction suspected. EXAM: MRI ABDOMEN WITHOUT AND WITH CONTRAST (INCLUDING MRCP) TECHNIQUE: Multiplanar multisequence MR imaging of the abdomen was performed both before and after the administration of intravenous contrast. Heavily T2-weighted images of the biliary and pancreatic ducts were obtained, and three-dimensional MRCP images were rendered by post processing. CONTRAST:  66m GADAVIST GADOBUTROL 1 MMOL/ML IV SOLN COMPARISON:  CT 07/06/2022 FINDINGS: Portions of exam, especially the MRCP dedicated images, are degraded by patient body habitus and less so motion. Lower chest: Normal heart size without pericardial or pleural effusion. Hepatobiliary: Mild hepatic steatosis. Suspect mild cirrhosis, as evidenced by caudate lobe enlargement and medial segment left liver lobe atrophy. A too small to characterize observation within segment 7 of 1.0 cm on 18/16 demonstrates arterial  phase hypoenhancement and relative Iso enhancement on later post-contrast imaging. Cholecystectomy. There is mild intrahepatic biliary duct dilatation, with an index left hepatic duct measuring 7 mm on 15/4. This is new since 05/10/2016. Mild enhancement is suspected surrounding intrahepatic ducts, most apparent within the right hepatic lobe including on 34/20 and 30/24. The common duct is minimally dilated at 11 mm in the porta hepatis on 16/5. Tapers gradually in the region of the pancreatic head. This is also new since 2017. There is an equivocal 3 mm filling defect within the distal common duct only identified on 10/12. Pancreas:  Normal, without mass or ductal dilatation. Spleen:  Mild splenomegaly, 13.1 cm. Adrenals/Urinary Tract: Normal adrenal glands. Normal kidneys, without hydronephrosis. Stomach/Bowel: Lap band.  Normal abdominal bowel loops. Vascular/Lymphatic: Normal caliber of the aorta and branch vessels. Patent portal and splenic veins without evidence of portal venous hypertension. Prominent porta hepatis nodes are likely related to the suspected cirrhosis. Other:  No ascites. Musculoskeletal: No acute osseous abnormality. 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 IMPRESSION: 1. Patient body habitus and less so motion mildly degraded exam. 2. Cholecystectomy with intrahepatic and less so extrahepatic biliary duct dilatation, new since 2017. Equivocal 3 mm distal common duct stone. Suggestion of mild enhancement surrounding intrahepatic ducts. Consider ERCP to evaluate for subtle choledocholithiasis and/or infectious versus inflammatory cholangitis. 3. Mild hepatic steatosis.  Suspicion of mild cirrhosis. 4. Too small to characterize segment 7 liver lesion does not have suspicious characteristics. If the patient is eventually diagnosed with cirrhosis, this would be characterized as LR 3 and warrant follow-up MRI at 6 months. 5. Mild splenomegaly Electronically Signed   By: KAbigail Miyamoto M.D.   On: 08/22/2022 08:09   CT CORONARY MORPH W/CTA COR W/SCORE W/CA W/CM &/OR WO/CM  Addendum Date: 08/21/2022   ADDENDUM REPORT: 08/21/2022 12:22 EXAM: OVER-READ INTERPRETATION  CT CHEST The following report is an over-read performed by radiologist Dr. [facility]West [facility] on 08/21/2022. This over-read does not include interpretation of cardiac or coronary anatomy or pathology. The coronary CTA interpretation by the cardiologist is attached. COMPARISON:  08/19/2022 FINDINGS: Aorta normal caliber. No pericardial effusion. Pulmonary arteries patent. Esophagus unremarkable. Visualized upper abdomen remarkable only for tubing in the LEFT upper quadrant. Lungs clear. No acute osseous findings. IMPRESSION: No acute or significant extracardiac findings. Electronically Signed   By: MLavonia DanaM.D.   On: 08/21/2022 12:22   Result Date: 08/21/2022 CLINICAL DATA:  Chest pain EXAM: Cardiac/Coronary CTA TECHNIQUE: A non-contrast, gated CT scan was obtained  with axial slices of 3 mm through the heart for calcium scoring. Calcium scoring was performed using the Agatston method. A 120 kV prospective, gated, contrast cardiac scan was obtained. Gantry rotation speed was 250 msecs and collimation was 0.6 mm. Two sublingual nitroglycerin tablets (0.8 mg) were given. The 3D data set was reconstructed in 5% intervals of the 35-75% of the R-R cycle. Diastolic phases were analyzed on a dedicated workstation using MPR, MIP, and VRT modes. The patient received 95 cc of contrast. FINDINGS: Image quality: Average.  Slab artifact noted. Noise artifact is: Limited. Coronary Arteries:  Normal coronary origin.  Left dominance. Left main: The left main is a large caliber vessel with a normal take off from the left coronary cusp that bifurcates to form a left anterior descending artery and a left circumflex artery. There is no plaque or stenosis. Left anterior descending artery: The LAD is patent without evidence of  plaque or stenosis. The LAD gives off 2 patent diagonal branches. Left circumflex artery: The LCX is dominant and patent with no evidence of plaque or stenosis. The LCX gives off 2 patent obtuse marginal branches. Right coronary artery: The RCA is non-dominant with normal take off from the right coronary cusp. There is no evidence of plaque or stenosis. The RCA terminates as a PDA and right posterolateral branch without evidence of plaque or stenosis. Right Atrium: Right atrial size is within normal limits. Right Ventricle: The right ventricular cavity is within normal limits. Left Atrium: Left atrial size is normal in size with no left atrial appendage filling defect. Small PFO. Left Ventricle: The ventricular cavity size is within normal limits. Pulmonary arteries: Normal in size without proximal filling defect. Pulmonary veins: Normal pulmonary venous drainage. Pericardium: Normal thickness without significant effusion or calcium present. Cardiac valves: The aortic valve is trileaflet without significant calcification. The mitral valve is normal without significant calcification. Aorta: Normal caliber without significant disease. Extra-cardiac findings: See attached radiology report for non-cardiac structures. IMPRESSION: 1. Coronary calcium score of 0. 2. Normal coronary origin with left dominance. 3. Normal coronary arteries. RECOMMENDATIONS: 1. No evidence of CAD (0%). Consider non-atherosclerotic causes of chest pain. Eleonore Chiquito, MD Electronically Signed: By: Eleonore Chiquito M.D. On: 08/21/2022 10:42   ECHOCARDIOGRAM COMPLETE  Result Date: 08/20/2022    ECHOCARDIOGRAM REPORT   Patient Name:   OLGA LUZ PAULA DE CRUZ Date of Exam: 08/20/2022 Medical Rec #:  [id number]              Height:       65.0 in Accession #:    [id number]             Weight:       301.8 lb Date of Birth:  13-Mar-1981              BSA:          2.356 m Patient Age:    81 years               BP:           123/68 mmHg Patient Gender: F                       HR:           82 bpm. Exam Location:  Inpatient Procedure: 2D Echo, Cardiac Doppler, Color Doppler and Strain Analysis Indications:    Chest Pain R07.9  History:        Patient has prior  history of Echocardiogram examinations, most                 recent 05/07/2017. Risk Factors:Sleep Apnea.  Sonographer:    Darlina Sicilian RDCS Referring Phys: [id number] [place]  1. Left ventricular ejection fraction, by estimation, is 60 to 65%. The left ventricle has normal function. The left ventricle has no regional wall motion abnormalities. Left ventricular diastolic parameters were normal. The average left ventricular global longitudinal strain is -24.9 %. The global longitudinal strain is normal.  2. Right ventricular systolic function is normal. The right ventricular size is normal.  3. The mitral valve is normal in structure. No evidence of mitral valve regurgitation. No evidence of mitral stenosis.  4. The aortic valve is tricuspid. Aortic valve regurgitation is not visualized. No aortic stenosis is present.  5. The inferior vena cava is normal in size with greater than 50% respiratory variability, suggesting right atrial pressure of 3 mmHg. Comparison(s): No significant change from prior study. Conclusion(s)/Recommendation(s): Normal biventricular function without evidence of hemodynamically significant valvular heart disease. FINDINGS  Left Ventricle: Left ventricular ejection fraction, by estimation, is 60 to 65%. The left ventricle has normal function. The left ventricle has no regional wall motion abnormalities. The average left ventricular global longitudinal strain is -24.9 %. The global longitudinal strain is normal. The left ventricular internal cavity size was normal in size. There is no left ventricular hypertrophy. Left ventricular diastolic parameters were normal. Right Ventricle: The right ventricular size is normal. No increase in right ventricular wall thickness. Right  ventricular systolic function is normal. Left Atrium: Left atrial size was normal in size. Right Atrium: Right atrial size was normal in size. Pericardium: There is no evidence of pericardial effusion. Mitral Valve: The mitral valve is normal in structure. No evidence of mitral valve regurgitation. No evidence of mitral valve stenosis. Tricuspid Valve: The tricuspid valve is grossly normal. Tricuspid valve regurgitation is not demonstrated. No evidence of tricuspid stenosis. Aortic Valve: The aortic valve is tricuspid. Aortic valve regurgitation is not visualized. No aortic stenosis is present. Pulmonic Valve: The pulmonic valve was not well visualized. Pulmonic valve regurgitation is not visualized. No evidence of pulmonic stenosis. Aorta: The aortic root, ascending aorta, aortic arch and descending aorta are all structurally normal, with no evidence of dilitation or obstruction. Venous: The inferior vena cava is normal in size with greater than 50% respiratory variability, suggesting right atrial pressure of 3 mmHg. IAS/Shunts: No atrial level shunt detected by color flow Doppler.  LEFT VENTRICLE PLAX 2D LVIDd:         5.20 cm   Diastology LVIDs:         2.80 cm   LV e' medial:    12.80 cm/s LV PW:         0.70 cm   LV E/e' medial:  6.8 LV IVS:        0.70 cm   LV e' lateral:   14.10 cm/s LVOT diam:     2.20 cm   LV E/e' lateral: 6.2 LV SV:         93 LV SV Index:   39        2D Longitudinal Strain LVOT Area:     3.80 cm  2D Strain GLS (A2C):   -25.7 %                          2D Strain GLS (A3C):   -23.8 %  2D Strain GLS (A4C):   -25.3 %                          2D Strain GLS Avg:     -24.9 % RIGHT VENTRICLE RV S prime:     14.80 cm/s TAPSE (M-mode): 2.8 cm LEFT ATRIUM             Index        RIGHT ATRIUM           Index LA diam:        4.20 cm 1.78 cm/m   RA Area:     14.20 cm LA Vol (A2C):   20.5 ml 8.70 ml/m   RA Volume:   33.40 ml  14.18 ml/m LA Vol (A4C):   38.1 ml 16.17 ml/m  LA Biplane Vol: 28.1 ml 11.93 ml/m  AORTIC VALVE LVOT Vmax:   123.00 cm/s LVOT Vmean:  78.900 cm/s LVOT VTI:    0.244 m  AORTA Ao Root diam: 2.70 cm Ao Asc diam:  2.90 cm MITRAL VALVE MV Area (PHT): 4.60 cm    SHUNTS MV Decel Time: 165 msec    Systemic VTI:  0.24 m MV E velocity: 87.00 cm/s  Systemic Diam: 2.20 cm MV A velocity: 66.00 cm/s MV E/A ratio:  1.32 Buford Dresser MD Electronically signed by Buford Dresser MD Signature Date/Time: 08/20/2022/4:56:52 PM    Final    CT Angio Chest PE W and/or Wo Contrast  Result Date: 08/19/2022 CLINICAL DATA:  Pulmonary embolism (PE) suspected, low to intermediate prob, positive D-dimer EXAM: CT ANGIOGRAPHY CHEST WITH CONTRAST TECHNIQUE: Multidetector CT imaging of the chest was performed using the standard protocol during bolus administration of intravenous contrast. Multiplanar CT image reconstructions and MIPs were obtained to evaluate the vascular anatomy. RADIATION DOSE REDUCTION: This exam was performed according to the departmental dose-optimization program which includes automated exposure control, adjustment of the mA and/or kV according to patient size and/or use of iterative reconstruction technique. CONTRAST:  72 mL Omnipaque 350non-ionic IV contrast. COMPARISON:  07/06/2022 FINDINGS: Cardiovascular: Satisfactory opacification of the pulmonary arteries to the segmental level. No evidence of pulmonary embolism. Normal heart size. No pericardial effusion. Thoracic aorta unremarkable. Mediastinum/Nodes: No enlarged mediastinal, hilar, or axillary lymph nodes. Thyroid gland, trachea, and esophagus demonstrate no significant findings. Lungs/Pleura: Lungs are clear. No pleural effusion or pneumothorax. Upper Abdomen: No acute abnormality.  Lap band noted. Musculoskeletal: No chest wall abnormality. No acute or significant osseous findings. There are thoracic degenerative changes. Review of the MIP images confirms the above findings. IMPRESSION: No  evidence of PE, aneurysm or dissection. Electronically Signed   By: Sammie Bench M.D.   On: 08/19/2022 17:54   DG Chest 2 View  Result Date: 08/19/2022 CLINICAL DATA:  Chest pain and weakness EXAM: CHEST - 2 VIEW COMPARISON:  Chest radiograph dated 07/06/2022 FINDINGS: Normal lung volumes. No focal consolidations. No pleural effusion or pneumothorax. The heart size and mediastinal contours are within normal limits. The visualized skeletal structures are unremarkable. IMPRESSION: No active cardiopulmonary disease. Electronically Signed   By: Darrin Nipper M.D.   On: 08/19/2022 15:22     Additional studies/ records that were reviewed today include:   I reviewed the records from [facility].  Records of [place] were reviewed.    ASSESSMENT:    1. Chest pain, unspecified type   2. Sleep apnea, unspecified type   3. Morbid obesity ([place])   4.  History of LAP-BAND surgery   5. Microcytic anemia   6. Labile blood pressure   7. Mild intermittent asthma without complication     PLAN:  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  Her ancestry [place] from [place] but she had lived prior to coming to [place] in [place].  She has a history of mild asthma as well as eczema.  She had undergone prior lap band surgery, cholecystectomy, and right salpingectmy.  She admits that at times she has had blood pressure lability.  She takes furosemide 20 mg every other day. She recently began to notice sharp chest pain which awakened her from sleep associated with back, posterior neck and left arm radiation.  She was evaluated in the emergency room and was assessed in the [facility] encounter.  Subsequent CT angiography of her chest abdomen and pelvis was within normal limits chest x-ray.  Cardiac troponins were normal.  She was noted to have slight white blood count elevation of 13.8.  She had microcytic  anemia with MCV 69 hemoglobin 10.8 hematocrit 33.0.  Lipase was 50.  There was mild transaminase elevation with AST 54 ALT 44 and alkaline phosphatase at 233.  Potassium was 3.6.  She ultimately left AGAINST MEDICAL ADVICE due to the very long delay.  She has seen her primary provider Verdis Frederickson, PA-C and is now referred for evaluation.  Apparently the patient was to undergo a nuclear stress test and echo.  Her ECG today is stable and does not show signs of ischemia and is not significantly changed from her prior evaluation from the emergency room.  Presently, I am recommending she undergo a 2D echo Doppler study for evaluation of systolic and diastolic function.  With her very large body habitus with BMI 51.89 I do not feel nuclear stress imaging would be beneficial particularly due to the high likelihood of breast attenuation artifact.  As result I have recommended she undergo coronary CTA which we will definitive to assess for coronary calcification as well as percent luminal obstruction.  I am scheduling her to undergo comprehensive metabolic panel, CBC, TSH and lipid studies.  She was noted to have microcytic anemia may ultimately require iron studies or further evaluation of hemoglobinopathy.  Her work schedule is not optimal for and she is ulcerating times of when she sleeps due to working nights 3 days/week.  With her prior diagnosis of sleep apnea, super morbid obesity, I am scheduling her for a split-night in lab study for further evaluation of high likelihood of obstructive sleep apnea.  I will contact her regarding the results and see her in follow-up after the above studies are completed.   Medication Adjustments/Labs and Tests Ordered: Current medicines are reviewed at length with the patient today.  Concerns regarding medicines are outlined above.  Medication changes, Labs and Tests ordered today are listed in the Patient Instructions below. Patient Instructions  Medication Instructions:   Your physician recommends that you continue on your current medications as directed. Please refer to the Current Medication list given to you today.  *If you need a refill on your cardiac medications before your next appointment, please call your pharmacy*   Lab Work: Your physician recommends that you return for lab work as soon as possible to have the following labs drawn: CMET, CBC, TSH and Lipid Panel  If you have labs (blood work) drawn today  and your tests are completely normal, you will receive your results only by: MyChart Message (if you have MyChart) OR A paper copy in the mail If you have any lab test that is abnormal or we need to change your treatment, we will call you to review the results.   Testing/Procedures: Your physician has requested that you have an echocardiogram. Echocardiography is a painless test that uses sound waves to create images of your heart. It provides your doctor with information about the size and shape of your heart and how well your heart's chambers and valves are working. This procedure takes approximately one hour. There are no restrictions for this procedure. Please do NOT wear cologne, perfume, aftershave, or lotions (deodorant is allowed). Please arrive 15 minutes prior to your appointment time.  Your physician has recommended that you have a sleep study. This test records several body functions during sleep, including: brain activity, eye movement, oxygen and carbon dioxide blood levels, heart rate and rhythm, breathing rate and rhythm, the flow of air through your mouth and nose, snoring, body muscle movements, and chest and belly movement.  Cardiac CT Angiography (CTA), is a special type of CT scan that uses a computer to produce multi-dimensional views of major blood vessels throughout the body. In CT angiography, a contrast material is injected through an IV to help visualize the blood vessels    Follow-Up: At [facility], you and  your health needs are our priority.  As part of our continuing mission to provide you with exceptional heart care, we have created designated Provider Care Teams.  These Care Teams include your primary Cardiologist (physician) and Advanced Practice Providers (APPs -  Physician Assistants and Nurse Practitioners) who all work together to provide you with the care you need, when you need it.  We recommend signing up for the patient portal called "MyChart".  Sign up information is provided on this After Visit Summary.  MyChart is used to connect with patients for Virtual Visits (Telemedicine).  Patients are able to view lab/test results, encounter notes, upcoming appointments, etc.  Non-urgent messages can be sent to your provider as well.   To learn more about what you can do with MyChart, go to [email or web address].    Your next appointment:   10/02/2022 at 2:40pm   The format for your next appointment:   In Person  Provider:   Shelva Majestic, MD     Other Instructions   Your cardiac CT will be scheduled at one of the below locations:   [facility] [address] [place], [place] [address] [phone]  If scheduled at [facility], please arrive at the [facility] and [facility] Entrance (Entrance C2) of [facility] 30 minutes prior to test start time. You can use the FREE valet parking offered at entrance C (encouraged to control the heart rate for the test)  Proceed to the [facility] Radiology Department (first floor) to check-in and test prep.  All radiology patients and guests should use entrance C2 at [facility], accessed from [facility], even though the hospital's physical address listed is [address].      Please follow these instructions carefully (unless otherwise directed):   On the Night Before the Test: Be sure to Drink plenty of water. Do not consume any caffeinated/decaffeinated beverages or chocolate 12  hours prior to your test. Do not take any antihistamines 12 hours prior to your test.   On the Day  of the Test: Drink plenty of water until 1 hour prior to the test. Do not eat any food 1 hour prior to test. You may take your regular medications prior to the test.  Take metoprolol (Lopressor) 173m two hours prior to test. FEMALES- please wear underwire-free bra if available, avoid dresses & tight clothing      After the Test: Drink plenty of water. After receiving IV contrast, you may experience a mild flushed feeling. This is normal. On occasion, you may experience a mild rash up to 24 hours after the test. This is not dangerous. If this occurs, you can take Benadryl 25 mg and increase your fluid intake. If you experience trouble breathing, this can be serious. If it is severe call 911 IMMEDIATELY. If it is mild, please call our office. If you take any of these medications: Glipizide/Metformin, Avandament, Glucavance, please do not take 48 hours after completing test unless otherwise instructed.  We will call to schedule your test 2-4 weeks out understanding that some insurance companies will need an authorization prior to the service being performed.   For non-scheduling related questions, please contact the cardiac imaging nurse navigator should you have any questions/concerns: SMarchia Bond Cardiac Imaging Nurse Navigator MGordy Clement Cardiac Imaging Nurse Navigator [facility] Direct Office Dial: [phone]  For scheduling needs, including cancellations and rescheduling, please call [place] [phone]   Important Information About Sugar         Signed, TShelva Majestic MD  08/26/2022 11:23 AM    [facility] [address] [place]  [id number]: [phone]

## 2022-08-16 ENCOUNTER — Encounter (HOSPITAL_COMMUNITY): Payer: Self-pay

## 2022-08-19 ENCOUNTER — Emergency Department (HOSPITAL_BASED_OUTPATIENT_CLINIC_OR_DEPARTMENT_OTHER): Payer: 59

## 2022-08-19 ENCOUNTER — Emergency Department (HOSPITAL_BASED_OUTPATIENT_CLINIC_OR_DEPARTMENT_OTHER): Payer: 59 | Admitting: Radiology

## 2022-08-19 ENCOUNTER — Other Ambulatory Visit: Payer: Self-pay

## 2022-08-19 ENCOUNTER — Encounter (HOSPITAL_COMMUNITY): Payer: Self-pay

## 2022-08-19 ENCOUNTER — Encounter (HOSPITAL_BASED_OUTPATIENT_CLINIC_OR_DEPARTMENT_OTHER): Payer: Self-pay

## 2022-08-19 ENCOUNTER — Inpatient Hospital Stay (HOSPITAL_BASED_OUTPATIENT_CLINIC_OR_DEPARTMENT_OTHER)
Admission: EM | Admit: 2022-08-19 | Discharge: 2022-08-23 | DRG: 445 | Disposition: A | Discharge status: Home / Self Care | Payer: 59 | Attending: Internal Medicine | Admitting: Internal Medicine

## 2022-08-19 DIAGNOSIS — Z825 Family history of asthma and other chronic lower respiratory diseases: Secondary | ICD-10-CM | POA: Diagnosis not present

## 2022-08-19 DIAGNOSIS — J4489 Other specified chronic obstructive pulmonary disease: Secondary | ICD-10-CM | POA: Diagnosis present

## 2022-08-19 DIAGNOSIS — D563 Thalassemia minor: Secondary | ICD-10-CM | POA: Diagnosis not present

## 2022-08-19 DIAGNOSIS — D72829 Elevated white blood cell count, unspecified: Secondary | ICD-10-CM | POA: Diagnosis present

## 2022-08-19 DIAGNOSIS — Z6841 Body Mass Index (BMI) 40.0 and over, adult: Secondary | ICD-10-CM | POA: Diagnosis not present

## 2022-08-19 DIAGNOSIS — Z9049 Acquired absence of other specified parts of digestive tract: Secondary | ICD-10-CM | POA: Diagnosis not present

## 2022-08-19 DIAGNOSIS — Z87891 Personal history of nicotine dependence: Secondary | ICD-10-CM

## 2022-08-19 DIAGNOSIS — R7303 Prediabetes: Secondary | ICD-10-CM | POA: Diagnosis present

## 2022-08-19 DIAGNOSIS — Z833 Family history of diabetes mellitus: Secondary | ICD-10-CM | POA: Diagnosis not present

## 2022-08-19 DIAGNOSIS — Z823 Family history of stroke: Secondary | ICD-10-CM

## 2022-08-19 DIAGNOSIS — R7401 Elevation of levels of liver transaminase levels: Secondary | ICD-10-CM | POA: Diagnosis not present

## 2022-08-19 DIAGNOSIS — K7460 Unspecified cirrhosis of liver: Secondary | ICD-10-CM | POA: Diagnosis not present

## 2022-08-19 DIAGNOSIS — R1013 Epigastric pain: Secondary | ICD-10-CM | POA: Diagnosis not present

## 2022-08-19 DIAGNOSIS — J4520 Mild intermittent asthma, uncomplicated: Secondary | ICD-10-CM | POA: Diagnosis not present

## 2022-08-19 DIAGNOSIS — K8050 Calculus of bile duct without cholangitis or cholecystitis without obstruction: Secondary | ICD-10-CM | POA: Diagnosis not present

## 2022-08-19 DIAGNOSIS — Z8041 Family history of malignant neoplasm of ovary: Secondary | ICD-10-CM | POA: Diagnosis not present

## 2022-08-19 DIAGNOSIS — D509 Iron deficiency anemia, unspecified: Secondary | ICD-10-CM | POA: Diagnosis present

## 2022-08-19 DIAGNOSIS — I200 Unstable angina: Principal | ICD-10-CM

## 2022-08-19 DIAGNOSIS — R300 Dysuria: Secondary | ICD-10-CM | POA: Diagnosis not present

## 2022-08-19 DIAGNOSIS — R079 Chest pain, unspecified: Secondary | ICD-10-CM

## 2022-08-19 DIAGNOSIS — Z1152 Encounter for screening for COVID-19: Secondary | ICD-10-CM

## 2022-08-19 DIAGNOSIS — E282 Polycystic ovarian syndrome: Secondary | ICD-10-CM | POA: Diagnosis present

## 2022-08-19 DIAGNOSIS — Z800 Family history of malignant neoplasm of digestive organs: Secondary | ICD-10-CM

## 2022-08-19 DIAGNOSIS — I959 Hypotension, unspecified: Secondary | ICD-10-CM | POA: Diagnosis not present

## 2022-08-19 DIAGNOSIS — Z8249 Family history of ischemic heart disease and other diseases of the circulatory system: Secondary | ICD-10-CM | POA: Diagnosis not present

## 2022-08-19 DIAGNOSIS — J45909 Unspecified asthma, uncomplicated: Secondary | ICD-10-CM | POA: Diagnosis present

## 2022-08-19 DIAGNOSIS — R072 Precordial pain: Secondary | ICD-10-CM | POA: Diagnosis not present

## 2022-08-19 DIAGNOSIS — R531 Weakness: Secondary | ICD-10-CM | POA: Diagnosis not present

## 2022-08-19 DIAGNOSIS — K838 Other specified diseases of biliary tract: Secondary | ICD-10-CM | POA: Diagnosis not present

## 2022-08-19 DIAGNOSIS — F419 Anxiety disorder, unspecified: Secondary | ICD-10-CM | POA: Diagnosis present

## 2022-08-19 DIAGNOSIS — Z8049 Family history of malignant neoplasm of other genital organs: Secondary | ICD-10-CM

## 2022-08-19 DIAGNOSIS — R0600 Dyspnea, unspecified: Secondary | ICD-10-CM

## 2022-08-19 DIAGNOSIS — F32A Depression, unspecified: Secondary | ICD-10-CM | POA: Diagnosis not present

## 2022-08-19 DIAGNOSIS — R0602 Shortness of breath: Secondary | ICD-10-CM | POA: Diagnosis not present

## 2022-08-19 DIAGNOSIS — Z79899 Other long term (current) drug therapy: Secondary | ICD-10-CM

## 2022-08-19 DIAGNOSIS — K219 Gastro-esophageal reflux disease without esophagitis: Secondary | ICD-10-CM | POA: Diagnosis present

## 2022-08-19 DIAGNOSIS — D508 Other iron deficiency anemias: Secondary | ICD-10-CM | POA: Diagnosis not present

## 2022-08-19 DIAGNOSIS — G629 Polyneuropathy, unspecified: Secondary | ICD-10-CM | POA: Diagnosis present

## 2022-08-19 DIAGNOSIS — R932 Abnormal findings on diagnostic imaging of liver and biliary tract: Secondary | ICD-10-CM | POA: Diagnosis not present

## 2022-08-19 DIAGNOSIS — R7989 Other specified abnormal findings of blood chemistry: Secondary | ICD-10-CM | POA: Diagnosis not present

## 2022-08-19 DIAGNOSIS — Z9884 Bariatric surgery status: Secondary | ICD-10-CM | POA: Diagnosis not present

## 2022-08-19 DIAGNOSIS — R102 Pelvic and perineal pain: Secondary | ICD-10-CM | POA: Diagnosis present

## 2022-08-19 DIAGNOSIS — F418 Other specified anxiety disorders: Secondary | ICD-10-CM | POA: Diagnosis not present

## 2022-08-19 DIAGNOSIS — R0789 Other chest pain: Secondary | ICD-10-CM | POA: Diagnosis not present

## 2022-08-19 LAB — CBC
HCT: 33.9 % — ABNORMAL LOW (ref 36.0–46.0)
Hemoglobin: 11.1 g/dL — ABNORMAL LOW (ref 12.0–15.0)
MCH: 22.2 pg — ABNORMAL LOW (ref 26.0–34.0)
MCHC: 32.7 g/dL (ref 30.0–36.0)
MCV: 67.8 fL — ABNORMAL LOW (ref 80.0–100.0)
Platelets: 353 10*3/uL (ref 150–400)
RBC: 5 MIL/uL (ref 3.87–5.11)
RDW: 16.5 % — ABNORMAL HIGH (ref 11.5–15.5)
WBC: 13.8 10*3/uL — ABNORMAL HIGH (ref 4.0–10.5)
nRBC: 0 % (ref 0.0–0.2)

## 2022-08-19 LAB — RESP PANEL BY RT-PCR (RSV, FLU A&B, COVID)  RVPGX2
Influenza A by PCR: NEGATIVE
Influenza B by PCR: NEGATIVE
Resp Syncytial Virus by PCR: NEGATIVE
SARS Coronavirus 2 by RT PCR: NEGATIVE

## 2022-08-19 LAB — BASIC METABOLIC PANEL
Anion gap: 10 (ref 5–15)
BUN: 11 mg/dL (ref 6–20)
CO2: 22 mmol/L (ref 22–32)
Calcium: 9.2 mg/dL (ref 8.9–10.3)
Chloride: 100 mmol/L (ref 98–111)
Creatinine, Ser: 0.7 mg/dL (ref 0.44–1.00)
GFR, Estimated: >60 mL/min (ref >60)
Glucose, Bld: 98 mg/dL (ref 70–99)
Potassium: 3.7 mmol/L (ref 3.5–5.1)
Sodium: 132 mmol/L — ABNORMAL LOW (ref 135–145)

## 2022-08-19 LAB — TROPONIN I (HIGH SENSITIVITY)
Troponin I (High Sensitivity): 4 ng/L (ref <18)
Troponin I (High Sensitivity): 4 ng/L (ref <18)

## 2022-08-19 LAB — BRAIN NATRIURETIC PEPTIDE: B Natriuretic Peptide: 27.9 pg/mL (ref 0.0–100.0)

## 2022-08-19 LAB — PREGNANCY, URINE: Preg Test, Ur: NEGATIVE

## 2022-08-19 LAB — D-DIMER, QUANTITATIVE: D-Dimer, Quant: 0.74 ug/mL-FEU — ABNORMAL HIGH (ref 0.00–0.50)

## 2022-08-19 MED ORDER — PANTOPRAZOLE SODIUM 40 MG PO TBEC
40.0000 mg | DELAYED_RELEASE_TABLET | Freq: Every day | ORAL | Status: DC
  Administered 2022-08-19 – 2022-08-23 (×4): 40 mg via ORAL
  Filled 2022-08-19 (×4): qty 1

## 2022-08-19 MED ORDER — ENOXAPARIN SODIUM 80 MG/0.8ML IJ SOSY
70.0000 mg | PREFILLED_SYRINGE | INTRAMUSCULAR | Status: DC
  Administered 2022-08-20 – 2022-08-21 (×2): 70 mg via SUBCUTANEOUS
  Filled 2022-08-19 (×3): qty 0.8

## 2022-08-19 MED ORDER — MORPHINE SULFATE (PF) 2 MG/ML IV SOLN: 1.0000 mg | INTRAVENOUS | Status: DC | PRN

## 2022-08-19 MED ORDER — SODIUM CHLORIDE 0.9% FLUSH
3.0000 mL | Freq: Two times a day (BID) | INTRAVENOUS | Status: DC
  Administered 2022-08-19 – 2022-08-22 (×6): 3 mL via INTRAVENOUS

## 2022-08-19 MED ORDER — ACETAMINOPHEN 650 MG RE SUPP: 650.0000 mg | Freq: Four times a day (QID) | RECTAL | Status: DC | PRN

## 2022-08-19 MED ORDER — OXYCODONE HCL 5 MG PO TABS
5.0000 mg | ORAL_TABLET | ORAL | Status: AC | PRN
  Administered 2022-08-19 – 2022-08-20 (×3): 5 mg via ORAL
  Filled 2022-08-19 (×3): qty 1

## 2022-08-19 MED ORDER — ONDANSETRON HCL 4 MG PO TABS: 4.0000 mg | ORAL_TABLET | Freq: Four times a day (QID) | ORAL | Status: DC | PRN

## 2022-08-19 MED ORDER — NITROGLYCERIN 0.4 MG SL SUBL
0.4000 mg | SUBLINGUAL_TABLET | SUBLINGUAL | Status: DC | PRN
  Administered 2022-08-19 (×2): 0.4 mg via SUBLINGUAL
  Filled 2022-08-19 (×2): qty 1

## 2022-08-19 MED ORDER — IOHEXOL 350 MG/ML SOLN
100.0000 mL | Freq: Once | INTRAVENOUS | Status: AC | PRN
  Administered 2022-08-19: 72 mL via INTRAVENOUS

## 2022-08-19 MED ORDER — ACETAMINOPHEN 325 MG PO TABS
650.0000 mg | ORAL_TABLET | Freq: Four times a day (QID) | ORAL | Status: DC | PRN
  Administered 2022-08-21: 650 mg via ORAL
  Filled 2022-08-19: qty 2

## 2022-08-19 MED ORDER — SENNOSIDES-DOCUSATE SODIUM 8.6-50 MG PO TABS: 1.0000 | ORAL_TABLET | Freq: Every evening | ORAL | Status: DC | PRN

## 2022-08-19 MED ORDER — ALUM & MAG HYDROXIDE-SIMETH 200-200-20 MG/5ML PO SUSP
30.0000 mL | Freq: Once | ORAL | Status: AC
  Administered 2022-08-19: 30 mL via ORAL
  Filled 2022-08-19: qty 30

## 2022-08-19 MED ORDER — ONDANSETRON HCL 4 MG/2ML IJ SOLN: 4.0000 mg | Freq: Four times a day (QID) | INTRAMUSCULAR | Status: DC | PRN

## 2022-08-19 MED ORDER — ALBUTEROL SULFATE (2.5 MG/3ML) 0.083% IN NEBU: 3.0000 mL | INHALATION_SOLUTION | Freq: Four times a day (QID) | RESPIRATORY_TRACT | Status: DC | PRN

## 2022-08-19 MED ORDER — LIDOCAINE VISCOUS HCL 2 % MT SOLN
15.0000 mL | Freq: Once | OROMUCOSAL | Status: AC
  Administered 2022-08-19: 15 mL via ORAL
  Filled 2022-08-19: qty 15

## 2022-08-19 MED ORDER — ASPIRIN 325 MG PO TBEC
325.0000 mg | DELAYED_RELEASE_TABLET | Freq: Once | ORAL | Status: AC
  Administered 2022-08-19: 325 mg via ORAL
  Filled 2022-08-19: qty 1

## 2022-08-19 NOTE — Hospital Course (Signed)
Karina [name] [name] is a 41 y.o. 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

## 2022-08-19 NOTE — Assessment & Plan Note (Addendum)
Persistent nonspecific mild leukocytosis.  No obvious active infection. -Patient with some complaints of suprapubic lower abdominal pain and some dysuria. -Leukocytosis trended down. -Urinalysis done unremarkable.

## 2022-08-19 NOTE — H&P (Signed)
History and Physical    Karina [name] XKG:[id number] DOB: July 15, 1981 DOA: 08/19/2022  PCP: Bailey Mech, PA-C  Patient coming from: Home  I have personally briefly reviewed patient's old medical records in [facility]  Chief Complaint: Chest pain  HPI: Karina [name] is a 41 y.o. 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  Patient reports developing intermittent chest pain beginning 1 month ago.  Pain is described as a pressure-like sensation in her left upper sternal area.  Discomfort occurs even at rest.  She reports radiation down her left arm, to her back, and sometimes up her neck.  Usually pain resolves after short period of time but today has been fairly persistent.  During his episodes she has associated diaphoresis and a lot of burping.  She denies any feeling of heartburn, dyspnea, cough, peripheral edema.  She reports a history of gastric lap band associated with subsequent mild esophageal dysmotility.  Follows with GI, Dr. Loreta Ave.  She reports minimal tobacco use when she was in high school.  Denies any alcohol or illicit drug use.  She reports heart disease in her mother.  She was given sublingual nitroglycerin twice while in the ED.  She says that this caused her to feel funny and as if her heart was racing.  Did not really resolve her chest discomfort.  Review of vitals show that she developed mild hypotension around the time she was given the nitroglycerin.  [facility] ED Course  Labs/Imaging on admission: I have personally reviewed following labs and imaging studies.  Initial vitals showed BP 144/80, pulse 81, RR 23, temp 98.6 F, SpO2 100% on room air.  Labs show WBC 13.8, hemoglobin 11.1, platelets  353,000, sodium 132, potassium 3.7, bicarb 22, BUN 11, creatinine 0.70, serum glucose 98, troponin 4 x 2, BNP 27.9, D-dimer 0.74.  Urine pregnancy negative.  COVID, flu, RSV negative.  2 view chest x-ray negative for focal consolidation, edema, effusion.  CTA chest negative for evidence of PE, aneurysm, or dissection.  Patient was given aspirin 325 mg, sublingual nitroglycerin x 2, GI cocktail.  EDP discussed with on-call cardiology fellow, Dr. Rosita Fire, who recommended medical admission.  The hospitalist service was consulted to admit for further evaluation and management.  Review of Systems: All systems reviewed and are negative except as documented in history of present illness above.   Past Medical History:  Diagnosis Date   Anemia    takes iron daily   Anxiety    no meds   Asthma    albuterol as needed   Chronic bronchitis    bronchitis x 1 event in her twenties   Complication of anesthesia    decreased respirations with lap band sx   Dysrhythmia     " arrhythmia during endoscopy procedure "    Edema extremities    hx of edema in lower ext.used to take lasix - none since twenties   Gallbladder disease    GERD (gastroesophageal reflux disease)    nexium    Hemoglobin C trait (HCC)    History of chicken pox    HSV (herpes simplex virus) anogenital infection    Neuropathy    feet   Pre-diabetes    Pregnant    svd 06/19/17   Sleep apnea    Does  not use cpap nightly   SVD (spontaneous vaginal delivery)    x 2   Wears glasses     Past Surgical History:  Procedure Laterality Date   BIOPSY  07/14/2020   Procedure: BIOPSY;  Surgeon: Charna ElizabethMann, Jyothi, MD;  Location: [facility];  Service: Endoscopy;;   CARPAL TUNNEL RELEASE Bilateral 2008, 2010   CHOLECYSTECTOMY  01/11/2012   Procedure: LAPAROSCOPIC CHOLECYSTECTOMY WITH INTRAOPERATIVE CHOLANGIOGRAM;  Surgeon: Cherylynn RidgesJames O Wyatt, MD;  Location: [facility] OR;  Service: General;  Laterality: N/A;   COLONOSCOPY     COLONOSCOPY WITH PROPOFOL N/A  07/14/2020   Procedure: COLONOSCOPY WITH PROPOFOL;  Surgeon: Charna ElizabethMann, Jyothi, MD;  Location: [facility];  Service: Endoscopy;  Laterality: N/A;   DILATION AND EVACUATION N/A 03/24/2020   Procedure: DILATATION AND EVACUATION;  Surgeon: Hoover BrownsKulwa, Ema, MD;  Location: [facility] OR;  Service: Gynecology;  Laterality: N/A;   LAPAROSCOPIC BILATERAL SALPINGECTOMY Right 08/14/2017   Procedure: LAPAROSCOPIC RIGHT  SALPINGECTOMY;  Surgeon: Silverio Layivard, Sandra, MD;  Location: [facility];  Service: Gynecology;  Laterality: Right;   LAPAROSCOPIC GASTRIC BANDING     UPPER GI ENDOSCOPY     WISDOM TOOTH EXTRACTION      Social History:  reports that she quit smoking about 26 years ago. Her smoking use included cigars and cigarettes. She has a 0.25 pack-year smoking history. She has never used smokeless tobacco. She reports that she does not currently use alcohol. She reports that she does not use drugs.  No Known Allergies  Family History  Problem Relation Age of Onset   Diabetes Sister    Asthma Sister    Heart disease Other    Hyperlipidemia Other    Hypertension Other    Stroke Other    Kidney disease Other    Thyroid disease Other    Ovarian cancer Maternal Aunt    Prostate cancer Maternal Uncle    Cervical cancer Maternal Aunt        cervial and thyroid   Liver cancer Paternal Grandfather    Prostate cancer Paternal Grandfather    Liver cancer Paternal Uncle    Anesthesia problems Mother    Asthma Maternal Grandmother    Hypotension Neg Hx    Malignant hyperthermia Neg Hx    Pseudochol deficiency Neg Hx      Prior to Admission medications   Medication Sig Start Date End Date Taking? Authorizing Provider  acetaminophen (TYLENOL) 500 MG tablet Take 500 mg by mouth every 6 (six) hours as needed for moderate pain.    [provider]  albuterol (VENTOLIN HFA) 108 (90 Base) MCG/ACT inhaler Inhale 2 puffs into the lungs every 4 (four) hours as needed for wheezing. 03/16/22     ALPRAZolam (XANAX) 0.5 MG  tablet Take 1 tablet (0.5 mg total) by mouth daily as needed for anxiety 03/16/22     Calcipotriene-Betameth Diprop (ENSTILAR) 0.005-0.064 % FOAM Apply 1 application topically daily. 04/11/22   Sheffield, Judye BosKelli R, PA-C  furosemide (LASIX) 20 MG tablet Take 1 tablet (20 mg total) by mouth every other day. 03/16/22     ibuprofen (ADVIL) 200 MG tablet Take 200 mg by mouth every 6 (six) hours as needed for moderate pain.    [provider]  imiquimod (ALDARA) 5 % cream Apply topically 3 (three) times a week at bedtime until warts completely disappear up to a maximum of 12 weeks. 03/16/22     influenza vac split quadrivalent PF (FLUARIX) 0.5 ML injection Inject into the muscle. 07/09/22  metoprolol tartrate (LOPRESSOR) 100 MG tablet Take 1 tablet (100 mg total) by mouth once for 1 dose. 2 hours prior to your CT 08/15/22 08/16/22  Lennette Bihari, MD  tiZANidine (ZANAFLEX) 2 MG tablet Take 1-2 tablets (2-4 mg total) by mouth 3 (three) times daily as needed. 03/16/22     VENTOLIN HFA 108 (90 Base) MCG/ACT inhaler Inhale 2 puffs into the lungs every 4 hours as needed for wheezing 03/14/21       Physical Exam: Vitals:   08/19/22 1630 08/19/22 1634 08/19/22 1942 08/19/22 1956  BP: 108/70  120/67   Pulse: 86  73   Resp: 15  (!) 22   Temp:  98.6 F (37 C) 98.6 F (37 C)   TempSrc:  Axillary Oral   SpO2: 100%  100%   Weight:    (!) 136.9 kg  Height:    5\' 5"  (1.651 m)   Constitutional: Sitting up in bed, NAD, calm, comfortable Eyes: EOMI, lids and conjunctivae normal ENMT: Mucous membranes are moist. Posterior pharynx clear of any exudate or lesions.Normal dentition.  Neck: normal, supple, no masses. Respiratory: clear to auscultation bilaterally, no wheezing, no crackles. Normal respiratory effort. No accessory muscle use.  Cardiovascular: Regular rate and rhythm, no murmurs / rubs / gallops. No extremity edema. 2+ pedal pulses. Abdomen: no tenderness, no masses palpated.  Musculoskeletal: no  clubbing / cyanosis. No joint deformity upper and lower extremities. Good ROM, no contractures. Normal muscle tone.  Skin: no rashes, lesions, ulcers. No induration Neurologic: Sensation intact. Strength 5/5 in all 4.  Psychiatric: Normal judgment and insight. Alert and oriented x 3. Normal mood.   EKG: Personally reviewed. Sinus rhythm, rate 90, no acute ischemic changes.  Similar to prior.  Assessment/Plan Principal Problem:   Chest pain Active Problems:   Asthma   Iron deficiency anemia   Leukocytosis   is a 41 y.o. 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  Assessment and Plan: * Chest pain Intermittent pressure-like chest discomfort occurring at rest.  Troponin negative x 2, EKG without significant ischemic changes.  CTA chest negative for PE, aneurysm, dissection.  Scheduled for outpatient CT coronary and TTE.  If cardiac workup negative would consider upper GI etiology and follow-up with Dr. 55 as an outpatient.  EDP discussed with on-call cardiology who recommended admission for further evaluation. -Consult cardiology in a.m. -Obtain echocardiogram -Keep on telemetry -Trial oral Protonix -Will keep n.p.o. after midnight  Asthma Stable, no wheezing on exam.  Continue albuterol as needed.  Leukocytosis Persistent nonspecific mild leukocytosis.  No obvious active infection.  Repeat CBC with differential in AM.  Iron deficiency anemia Multifactorial anemia due to iron deficiency and thalassemia trait.  Hemoglobin stable.  DVT prophylaxis: enoxaparin (LOVENOX) injection 40 mg Start: 08/19/22 2130 Code Status: Full code Family Communication: Discussed with patient, she has discussed with family Disposition Plan: From home and likely  discharge to home pending clinical progress Consults called: EDP discussed with on-call cardiology, formal consult will be needed in a.m. Severity of Illness: The appropriate patient status for this patient is OBSERVATION. Observation status is judged to be reasonable and necessary in order to provide the required intensity of service to ensure the patient's safety. The patient's presenting symptoms, physical exam findings, and initial radiographic and laboratory data in the  context of their medical condition is felt to place them at decreased risk for further clinical deterioration. Furthermore, it is anticipated that the patient will be medically stable for discharge from the hospital within 2 midnights of admission.   Darreld Mclean MD [facility]  If 7PM-7AM, please contact night-coverage [email or web address]  08/19/2022, 8:46 PM

## 2022-08-19 NOTE — ED Provider Notes (Addendum)
[facility]   CSN: [id number] Arrival date & time: 08/19/22  1417     History  Chief Complaint  Patient presents with   Chest Pain    Karina [name] is a 41 y.o. female.   Chest Pain Associated symptoms: shortness of breath      41 year old female with medical history significant for GERD, gallstone disease, HSV, presenting to the emergency department with chest pain.  The patient states that she has had intermittent chest pressure and tightness over the past month.  She saw outpatient cardiology, Dr. Tresa EndoKelly in clinic on 12/6 and was scheduled for an outpatient coronary CT.  She states that today she developed substernal chest pressure with no radiation, some associated shortness of breath.  She denies any fevers or chills.  Denies any cough.  She states that earlier today she was mildly diaphoretic.  Symptoms have persisted.  Home Medications Prior to Admission medications   Medication Sig Start Date End Date Taking? Authorizing Provider  acetaminophen (TYLENOL) 500 MG tablet Take 500 mg by mouth every 6 (six) hours as needed for moderate pain.    [provider]  albuterol (VENTOLIN HFA) 108 (90 Base) MCG/ACT inhaler Inhale 2 puffs into the lungs every 4 (four) hours as needed for wheezing. 03/16/22     ALPRAZolam (XANAX) 0.5 MG tablet Take 1 tablet (0.5 mg total) by mouth daily as needed for anxiety 03/16/22     Calcipotriene-Betameth Diprop (ENSTILAR) 0.005-0.064 % FOAM Apply 1 application topically daily. 04/11/22   Sheffield, Judye BosKelli R, PA-C  furosemide (LASIX) 20 MG tablet Take 1 tablet (20 mg total) by mouth every other day. 03/16/22     ibuprofen (ADVIL) 200 MG tablet Take 200 mg by mouth every 6 (six) hours as needed for moderate pain.    [provider]  imiquimod (ALDARA) 5 % cream Apply topically 3 (three) times a week at bedtime until warts completely disappear up to a maximum of 12 weeks. 03/16/22     influenza  vac split quadrivalent PF (FLUARIX) 0.5 ML injection Inject into the muscle. 07/09/22     metoprolol tartrate (LOPRESSOR) 100 MG tablet Take 1 tablet (100 mg total) by mouth once for 1 dose. 2 hours prior to your CT 08/15/22 08/16/22  Lennette BihariKelly, Thomas A, MD  tiZANidine (ZANAFLEX) 2 MG tablet Take 1-2 tablets (2-4 mg total) by mouth 3 (three) times daily as needed. 03/16/22     VENTOLIN HFA 108 (90 Base) MCG/ACT inhaler Inhale 2 puffs into the lungs every 4 hours as needed for wheezing 03/14/21         Allergies    Patient has no known allergies.    Review of Systems   Review of Systems  Respiratory:  Positive for shortness of breath.   Cardiovascular:  Positive for chest pain.  All other systems reviewed and are negative.   Physical Exam Updated Vital Signs BP 108/70   Pulse 86   Temp 98.6 F (37 C) (Axillary)   Resp 15   LMP  (LMP Unknown)   SpO2 100%  Physical Exam Vitals and nursing note reviewed.  Constitutional:      General: She is not in acute distress.    Appearance: She is well-developed.  HENT:     Head: Normocephalic and atraumatic.  Eyes:     Conjunctiva/sclera: Conjunctivae normal.  Cardiovascular:     Rate and Rhythm: Normal rate and regular rhythm.     Heart sounds:  No murmur heard. Pulmonary:     Effort: Pulmonary effort is normal. No respiratory distress.     Breath sounds: Normal breath sounds.  Abdominal:     Palpations: Abdomen is soft.     Tenderness: There is no abdominal tenderness.  Musculoskeletal:        General: No swelling.     Cervical back: Neck supple.     Right lower leg: No edema.     Left lower leg: No edema.  Skin:    General: Skin is warm and dry.     Capillary Refill: Capillary refill takes less than 2 seconds.  Neurological:     Mental Status: She is alert.  Psychiatric:        Mood and Affect: Mood normal.     ED Results / Procedures / Treatments   Labs (all labs ordered are listed, but only abnormal results are  displayed) Labs Reviewed  BASIC METABOLIC PANEL - Abnormal; Notable for the following components:      Result Value   Sodium 132 (*)    All other components within normal limits  CBC - Abnormal; Notable for the following components:   WBC 13.8 (*)    Hemoglobin 11.1 (*)    HCT 33.9 (*)    MCV 67.8 (*)    MCH 22.2 (*)    RDW 16.5 (*)    All other components within normal limits  D-DIMER, QUANTITATIVE - Abnormal; Notable for the following components:   D-Dimer, Quant 0.74 (*)    All other components within normal limits  RESP PANEL BY RT-PCR (RSV, FLU A&B, COVID)  RVPGX2  PREGNANCY, URINE  BRAIN NATRIURETIC PEPTIDE  TROPONIN I (HIGH SENSITIVITY)  TROPONIN I (HIGH SENSITIVITY)    EKG EKG Interpretation  Date/Time:  Sunday August 19 2022 14:36:01 EST Ventricular Rate:  90 PR Interval:  148 QRS Duration: 86 QT Interval:  348 QTC Calculation: 425 R Axis:   56 Text Interpretation: Normal sinus rhythm Nonspecific ST abnormality Abnormal ECG When compared with ECG of 05-Jul-2022 23:40, No significant change was found No significant change was found Confirmed by Glynn Octave [phone]) on 08/19/2022 2:46:00 PM  Radiology CT Angio Chest PE W and/or Wo Contrast  Result Date: 08/19/2022 CLINICAL DATA:  Pulmonary embolism (PE) suspected, low to intermediate prob, positive D-dimer EXAM: CT ANGIOGRAPHY CHEST WITH CONTRAST TECHNIQUE: Multidetector CT imaging of the chest was performed using the standard protocol during bolus administration of intravenous contrast. Multiplanar CT image reconstructions and MIPs were obtained to evaluate the vascular anatomy. RADIATION DOSE REDUCTION: This exam was performed according to the departmental dose-optimization program which includes automated exposure control, adjustment of the mA and/or kV according to patient size and/or use of iterative reconstruction technique. CONTRAST:  72 mL Omnipaque 350non-ionic IV contrast. COMPARISON:  07/06/2022  FINDINGS: Cardiovascular: Satisfactory opacification of the pulmonary arteries to the segmental level. No evidence of pulmonary embolism. Normal heart size. No pericardial effusion. Thoracic aorta unremarkable. Mediastinum/Nodes: No enlarged mediastinal, hilar, or axillary lymph nodes. Thyroid gland, trachea, and esophagus demonstrate no significant findings. Lungs/Pleura: Lungs are clear. No pleural effusion or pneumothorax. Upper Abdomen: No acute abnormality.  Lap band noted. Musculoskeletal: No chest wall abnormality. No acute or significant osseous findings. There are thoracic degenerative changes. Review of the MIP images confirms the above findings. IMPRESSION: No evidence of PE, aneurysm or dissection. Electronically Signed   By: Layla Maw M.D.   On: 08/19/2022 17:54   DG Chest 2 View  Result Date:  08/19/2022 CLINICAL DATA:  Chest pain and weakness EXAM: CHEST - 2 VIEW COMPARISON:  Chest radiograph dated 07/06/2022 FINDINGS: Normal lung volumes. No focal consolidations. No pleural effusion or pneumothorax. The heart size and mediastinal contours are within normal limits. The visualized skeletal structures are unremarkable. IMPRESSION: No active cardiopulmonary disease. Electronically Signed   By: Agustin Cree M.D.   On: 08/19/2022 15:22    Procedures Procedures    Medications Ordered in ED Medications  nitroGLYCERIN (NITROSTAT) SL tablet 0.4 mg (0.4 mg Sublingual Given 08/19/22 1821)  aspirin EC tablet 325 mg (has no administration in time range)  alum & mag hydroxide-simeth (MAALOX/MYLANTA) 200-200-20 MG/5ML suspension 30 mL (30 mLs Oral Given 08/19/22 1647)    And  lidocaine (XYLOCAINE) 2 % viscous mouth solution 15 mL (15 mLs Oral Given 08/19/22 1647)  iohexol (OMNIPAQUE) 350 MG/ML injection 100 mL (72 mLs Intravenous Contrast Given 08/19/22 1734)    ED Course/ Medical Decision Making/ A&P Clinical Course as of 08/19/22 1831  Sun Aug 19, 2022  1658 D-Dimer, Quant(!): 0.74 [JL]     Clinical Course User Index [JL] Ernie Avena, MD           HEART Score: 3                Medical Decision Making Amount and/or Complexity of Data Reviewed Labs: ordered. Decision-making details documented in ED Course. Radiology: ordered.  Risk OTC drugs. Prescription drug management. Decision regarding hospitalization.     41 year old female with medical history significant for GERD, gallstone disease, HSV, presenting to the emergency department with chest pain.  The patient states that she has had intermittent chest pressure and tightness over the past month.  She saw outpatient cardiology, Dr. Tresa Endo in clinic on 12/6 and was scheduled for an outpatient coronary CT.  She states that today she developed substernal chest pressure with no radiation, some associated shortness of breath.  She denies any fevers or chills.  Denies any cough.  She states that earlier today she was mildly diaphoretic.  Symptoms have persisted.  Vitals and telemetry on arrival: Afebrile, not tachycardic, mildly tachypneic RR 23, BP 144/80, saturating 100% on room air  Pertinent exam findings include: Lungs clear to auscultation bilaterally, no lower extremity edema  Differential diagnosis includes: ACS, unstable versus stable angina, PE, pneumonia, pneumothorax, ,pericarditis/myocarditis, GERD, PUD, musculoskeletal.  Recently considered new onset heart failure, pulmonary hypertension.  EKG: Normal sinus rhythm with a rate of 90 and no evidence of acute ischemic changes, abnormal intervals, or dysrhythmia. No STEMI.  Lab results include: Troponins x 2 negative, urine pregnancy negative, BMP generally unremarkable, CBC with a nonspecific leukocytosis to 13.8, COVID, flu, RSV PCR testing negative, BNP unremarkable.  Patient's D-dimer resulted elevated at 0.74.  Imaging results include: Chest x-ray without acute cardiac or pulm abnormality.  CTA PE ordered due to elevated D-dimer: IMPRESSION:  No evidence  of PE, aneurysm or dissection.    Course of tx has consisted of: Patient was administered aspirin and nitroglycerin.  Additionally she was administered Maalox and lidocaine without significant improvement.  No evidence of PE, dissection, no evidence of pneumothorax or pneumonia.  Cardiac enzymes are normal however the patient has had roughly 1 month of anginal symptoms.  Risk factors include obesity with a BMI of 52.  Considered pulmonary hypertension diagnosis the etiology of the patient's dyspnea however this would not cause the patient's pain.  No pericardial effusion on CT to suggest pericarditis.  No friction rub heard on exam.  Lungs are clear on auscultation.  No findings on EKG of pericarditis.  Discussed the care of the with Dr. Rosita Fire, on-call cardiology fellow who recommended admission for observation in the setting of unstable angina.  Hospitalist medicine consulted for admission. Dr. Allena Katz of hospitalist medicine accepted the patient in admission.    Final Clinical Impression(s) / ED Diagnoses Final diagnoses:  Unstable angina (HCC)  Chest pain, unspecified type  Dyspnea, unspecified type    Rx / DC Orders ED Discharge Orders     None         Ernie Avena, MD 08/19/22 1831    Ernie Avena, MD 08/19/22 Ayesha Mohair    Ernie Avena, MD 08/19/22 [phone]

## 2022-08-19 NOTE — Assessment & Plan Note (Signed)
Stable, no wheezing on exam.  Continue albuterol as needed.

## 2022-08-19 NOTE — Assessment & Plan Note (Signed)
Multifactorial anemia due to iron deficiency and thalassemia trait.  Hemoglobin stable.

## 2022-08-19 NOTE — Progress Notes (Signed)
Plan of Care Note for accepted transfer   Patient: Karina [name] MRN: [id number]   DOA: 08/19/2022  Facility requesting transfer: [facility] Requesting Provider: Josephina Gip, MD (Emergency Medicine) Reason for transfer: Chest pain  Facility course:  41 year old female with history of asthma, PCOS, iron deficiency anemia/thalassemia trait, s/p gastric bypass and cholecystectomy presenting with intermittent chest pressure and tightness over the last month. Seen by cardiology outpatient, Dr. Tresa Endo, 12/6.  Arranged for outpatient coronary CT (08/27/2022) and TTE (09/13/2022).  Returns with recurrent/persistent chest pain.  BP borderline hypotensive otherwise vital stable.  Troponin negative x 2.  BNP 27.9.  D-dimer 0.74.  CTA chest negative for PE, aneurysm, dissection.  EKG shows sinus rhythm without acute ischemic changes.  EDP discussed with on-call cardiology fellow, Dr. Rosita Fire, who requested medical admission for potentially expedited ischemic workup.  Plan of care: The patient is accepted for admission to Telemetry unit, at [facility] or [facility], first available.  Cardiology will need to be consulted to determine further ischemic workup plans and ultimate disposition.  Author: Darreld Mclean, MD 08/19/2022  Check [email or web address] for on-call coverage.  Nursing staff, Please call [facility] Admits & Consults System-Wide number on Amion as soon as patient's arrival, so appropriate admitting provider can evaluate the pt.

## 2022-08-19 NOTE — ED Notes (Signed)
Pt shoes were left behind.  Bagged and labeled them and called significant other to let them know that they can pick them up here

## 2022-08-19 NOTE — Assessment & Plan Note (Signed)
Intermittent pressure-like chest discomfort occurring at rest.  Troponin negative x 2, EKG without significant ischemic changes.  CTA chest negative for PE, aneurysm, dissection.  Scheduled for outpatient CT coronary and TTE.  If cardiac workup negative would consider upper GI etiology and follow-up with Dr. Loreta Ave as an outpatient.  EDP discussed with on-call cardiology who recommended admission for further evaluation. -Consult cardiology in a.m. -Obtain echocardiogram -Keep on telemetry -Trial oral Protonix -Will keep n.p.o. after midnight

## 2022-08-20 ENCOUNTER — Observation Stay (HOSPITAL_BASED_OUTPATIENT_CLINIC_OR_DEPARTMENT_OTHER): Payer: 59

## 2022-08-20 ENCOUNTER — Encounter (HOSPITAL_COMMUNITY): Payer: Self-pay | Admitting: Internal Medicine

## 2022-08-20 DIAGNOSIS — R300 Dysuria: Secondary | ICD-10-CM | POA: Diagnosis not present

## 2022-08-20 DIAGNOSIS — D72829 Elevated white blood cell count, unspecified: Secondary | ICD-10-CM

## 2022-08-20 DIAGNOSIS — R072 Precordial pain: Secondary | ICD-10-CM

## 2022-08-20 DIAGNOSIS — J4520 Mild intermittent asthma, uncomplicated: Secondary | ICD-10-CM | POA: Diagnosis not present

## 2022-08-20 DIAGNOSIS — R079 Chest pain, unspecified: Secondary | ICD-10-CM

## 2022-08-20 DIAGNOSIS — R7401 Elevation of levels of liver transaminase levels: Secondary | ICD-10-CM | POA: Diagnosis not present

## 2022-08-20 DIAGNOSIS — D508 Other iron deficiency anemias: Secondary | ICD-10-CM | POA: Diagnosis not present

## 2022-08-20 LAB — CBC WITH DIFFERENTIAL/PLATELET
Abs Immature Granulocytes: 0.04 10*3/uL (ref 0.00–0.07)
Basophils Absolute: 0.1 10*3/uL (ref 0.0–0.1)
Basophils Relative: 1 %
Eosinophils Absolute: 0.4 10*3/uL (ref 0.0–0.5)
Eosinophils Relative: 4 %
HCT: 34.2 % — ABNORMAL LOW (ref 36.0–46.0)
Hemoglobin: 11 g/dL — ABNORMAL LOW (ref 12.0–15.0)
Immature Granulocytes: 0 %
Lymphocytes Relative: 30 %
Lymphs Abs: 3 10*3/uL (ref 0.7–4.0)
MCH: 22.2 pg — ABNORMAL LOW (ref 26.0–34.0)
MCHC: 32.2 g/dL (ref 30.0–36.0)
MCV: 69.1 fL — ABNORMAL LOW (ref 80.0–100.0)
Monocytes Absolute: 0.7 10*3/uL (ref 0.1–1.0)
Monocytes Relative: 7 %
Neutro Abs: 5.8 10*3/uL (ref 1.7–7.7)
Neutrophils Relative %: 58 %
Platelets: 331 10*3/uL (ref 150–400)
RBC: 4.95 MIL/uL (ref 3.87–5.11)
RDW: 16.7 % — ABNORMAL HIGH (ref 11.5–15.5)
WBC: 10 10*3/uL (ref 4.0–10.5)
nRBC: 0 % (ref 0.0–0.2)

## 2022-08-20 LAB — LIPID PANEL
Cholesterol: 141 mg/dL (ref 0–200)
HDL: 36 mg/dL — ABNORMAL LOW (ref >40)
LDL Cholesterol: 97 mg/dL (ref 0–99)
Total CHOL/HDL Ratio: 3.9 RATIO
Triglycerides: 41 mg/dL (ref <150)
VLDL: 8 mg/dL (ref 0–40)

## 2022-08-20 LAB — URINALYSIS, ROUTINE W REFLEX MICROSCOPIC
Glucose, UA: NEGATIVE mg/dL
Hgb urine dipstick: NEGATIVE
Ketones, ur: NEGATIVE mg/dL
Leukocytes,Ua: NEGATIVE
Nitrite: NEGATIVE
Protein, ur: NEGATIVE mg/dL
Specific Gravity, Urine: 1.019 (ref 1.005–1.030)
pH: 6 (ref 5.0–8.0)

## 2022-08-20 LAB — BASIC METABOLIC PANEL
Anion gap: 7 (ref 5–15)
BUN: 10 mg/dL (ref 6–20)
CO2: 21 mmol/L — ABNORMAL LOW (ref 22–32)
Calcium: 8.5 mg/dL — ABNORMAL LOW (ref 8.9–10.3)
Chloride: 106 mmol/L (ref 98–111)
Creatinine, Ser: 0.74 mg/dL (ref 0.44–1.00)
GFR, Estimated: >60 mL/min (ref >60)
Glucose, Bld: 109 mg/dL — ABNORMAL HIGH (ref 70–99)
Potassium: 3.5 mmol/L (ref 3.5–5.1)
Sodium: 134 mmol/L — ABNORMAL LOW (ref 135–145)

## 2022-08-20 LAB — ECHOCARDIOGRAM COMPLETE
Area-P 1/2: 4.6 cm2
Height: 65 in
S' Lateral: 2.8 cm
Weight: 4828.96 oz

## 2022-08-20 LAB — MAGNESIUM: Magnesium: 1.8 mg/dL (ref 1.7–2.4)

## 2022-08-20 MED ORDER — ALUM & MAG HYDROXIDE-SIMETH 200-200-20 MG/5ML PO SUSP
30.0000 mL | Freq: Four times a day (QID) | ORAL | Status: DC | PRN
  Administered 2022-08-20: 30 mL via ORAL
  Filled 2022-08-20: qty 30

## 2022-08-20 MED ORDER — LIDOCAINE VISCOUS HCL 2 % MT SOLN: 15.0000 mL | Freq: Four times a day (QID) | OROMUCOSAL | Status: DC | PRN

## 2022-08-20 MED ORDER — ALPRAZOLAM 0.5 MG PO TABS
0.5000 mg | ORAL_TABLET | Freq: Every day | ORAL | Status: DC | PRN
  Administered 2022-08-21: 0.5 mg via ORAL
  Filled 2022-08-20: qty 1

## 2022-08-20 MED ORDER — POTASSIUM CHLORIDE CRYS ER 10 MEQ PO TBCR
40.0000 meq | EXTENDED_RELEASE_TABLET | Freq: Once | ORAL | Status: AC
  Administered 2022-08-20: 40 meq via ORAL
  Filled 2022-08-20: qty 4

## 2022-08-20 MED ORDER — METOPROLOL TARTRATE 50 MG PO TABS: 100.0000 mg | ORAL_TABLET | Freq: Once | ORAL | Status: DC | PRN

## 2022-08-20 MED ORDER — METOPROLOL TARTRATE 50 MG PO TABS
100.0000 mg | ORAL_TABLET | Freq: Once | ORAL | Status: AC
  Administered 2022-08-21: 100 mg via ORAL
  Filled 2022-08-20: qty 2

## 2022-08-20 MED ORDER — TIZANIDINE HCL 4 MG PO TABS: 2.0000 mg | ORAL_TABLET | Freq: Three times a day (TID) | ORAL | Status: DC | PRN

## 2022-08-20 NOTE — Progress Notes (Signed)
PROGRESS NOTE    Karina [name]  ZOX:[id number] DOB: 09-01-1981 DOA: 08/19/2022 PCP: Bailey Mech, PA-C    Chief Complaint  Patient presents with   Chest Pain    Brief Narrative:  Karina [name] is a 41 y.o. 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    Assessment & Plan:  Principal Problem:   Chest pain Active Problems:   Asthma   Iron deficiency anemia   Leukocytosis   Dysuria    Assessment and Plan: * Chest pain Intermittent pressure-like chest discomfort occurring at rest.  Troponin negative x 2, EKG without significant ischemic changes.  CTA chest negative for PE, aneurysm, dissection.  Scheduled for outpatient CT coronary and TTE.  If cardiac workup negative would consider upper GI etiology and follow-up with Dr. Loreta Ave as an outpatient.   EDP discussed with on-call cardiology who recommended admission for further evaluation. -2D echo obtained this morning and pending. -High-sensitivity troponin negative. -Continue PPI, placed on GI cocktail as needed. -Patient seen in consultation by cardiology and patient for coronary CT to be done tomorrow. -Per cardiology. -Consult cardiology in a.m.  Asthma -Stable.   -Albuterol as needed.   Dysuria -Check a UA with cultures and sensitivities. -If urinalysis abnormal we will place empirically on IV antibiotics pending urine cultures.  Leukocytosis Persistent nonspecific mild leukocytosis.  No obvious active infection. -Patient with some complaints of suprapubic lower abdominal pain and some dysuria. -Repeat CBC with improvement with leukocytosis.  -Check a UA with cultures and sensitivities.    Iron deficiency anemia Multifactorial anemia due to iron deficiency and  thalassemia trait.  Hemoglobin stable.         DVT prophylaxis: Lovenox Code Status: Full Family Communication: Updated patient.  No family at bedside. Disposition: Pending cardiology evaluation.  Hopefully home in the next 1 to 2 days and when cleared by cardiology.  Status is: Observation The patient remains OBS appropriate and will d/c before 2 midnights.   Consultants:  Cardiology: Dr.Tobb 08/20/2022  Procedures:  CT angiogram chest 08/19/2022 Chest x-ray 08/19/2022 2D echo pending 08/20/2022  Antimicrobials:  None   Subjective: Up in bed on the phone.  Some complaints of dysuria and suprapubic abdominal pain.  Denies any ongoing chest pain at this time.  Stated had some chest pain earlier on this morning was given some medication with some improvement.  Objective: Vitals:   08/19/22 2334 08/20/22 0340 08/20/22 0745 08/20/22 1327  BP: 119/66 117/70 114/64 123/68  Pulse: 81 89 77 72  Resp: (!) 24 19 18 18   Temp: 97.9 F (36.6 C) 98.6 F (37 C) 98.6 F (37 C) 98.1 F (36.7 C)  TempSrc: Oral Oral Oral Oral  SpO2: 99% 97% 99% 100%  Weight:      Height:       No intake or output data in the 24 hours ending 08/20/22 1658 Filed Weights   08/19/22 1956  Weight: (!) 136.9 kg    Examination:  General exam: Appears calm and comfortable  Respiratory system: Clear to auscultation.  No wheezes, no crackles, no rhonchi.  Respiratory effort normal. Cardiovascular system: S1 & S2 heard, RRR. No JVD, murmurs, rubs, gallops or clicks. No pedal edema. Gastrointestinal system: Abdomen is nondistended, soft and some tenderness to palpation in the suprapubic region.  No rebound.  No guarding.  Central nervous system: Alert and oriented. No focal neurological deficits. Extremities: Symmetric 5 x 5 power. Skin: No rashes, lesions or ulcers Psychiatry: Judgement and insight appear normal. Mood & affect appropriate.     Data Reviewed:   CBC: Recent Labs  Lab  08/19/22 1429 08/20/22 1142  WBC 13.8* 10.0  NEUTROABS  --  5.8  HGB 11.1* 11.0*  HCT 33.9* 34.2*  MCV 67.8* 69.1*  PLT 353 331    Basic Metabolic Panel: Recent Labs  Lab 08/19/22 1429 08/20/22 0444  NA 132* 134*  K 3.7 3.5  CL 100 106  CO2 22 21*  GLUCOSE 98 109*  BUN 11 10  CREATININE 0.70 0.74  CALCIUM 9.2 8.5*  MG  --  1.8    GFR: Estimated Creatinine Clearance: 127.4 mL/min (by C-G formula based on SCr of 0.74 mg/dL).  Liver Function Tests: No results for input(s): "AST", "ALT", "ALKPHOS", "BILITOT", "PROT", "ALBUMIN" in the last 168 hours.  CBG: No results for input(s): "GLUCAP" in the last 168 hours.   Recent Results (from the past 240 hour(s))  Resp panel by RT-PCR (RSV, Flu A&B, Covid)     Status: None   Collection Time: 08/19/22  3:57 PM   Specimen: Nasal Swab  Result Value Ref Range Status   SARS Coronavirus 2 by RT PCR NEGATIVE NEGATIVE Final    Comment: (NOTE) SARS-CoV-2 target nucleic acids are NOT DETECTED.  The SARS-CoV-2 RNA is generally detectable in upper respiratory specimens during the acute phase of infection. The lowest concentration of SARS-CoV-2 viral copies this assay can detect is 138 copies/mL. A negative result does not preclude SARS-Cov-2 infection and should not be used as the sole basis for treatment or other patient management decisions. A negative result may occur with  improper specimen collection/handling, submission of specimen other than nasopharyngeal swab, presence of viral mutation(s) within the areas targeted by this assay, and inadequate number of viral copies(<138 copies/mL). A negative result must be combined with clinical observations, patient history, and epidemiological information. The expected result is Negative.  Fact Sheet for Patients:  BloggerCourse.com  Fact Sheet for Healthcare Providers:  SeriousBroker.it  This test is no t yet approved or cleared  by the [place] FDA and  has been authorized for detection and/or diagnosis of SARS-CoV-2 by FDA under an Emergency Use Authorization (EUA). This EUA will remain  in effect (meaning this test can be used) for the duration of the COVID-19 declaration under Section 564(b)(1) of the Act, 21 U.S.C.section 360bbb-3(b)(1), unless the authorization is terminated  or revoked sooner.       Influenza A by PCR NEGATIVE NEGATIVE Final   Influenza B by PCR NEGATIVE NEGATIVE Final    Comment: (NOTE) The Xpert Xpress SARS-CoV-2/FLU/RSV plus assay is intended as an aid in the diagnosis of influenza from Nasopharyngeal swab specimens and should not be used as a sole basis for treatment. Nasal washings and aspirates are unacceptable for Xpert Xpress SARS-CoV-2/FLU/RSV testing.  Fact Sheet for Patients: BloggerCourse.com  Fact Sheet for Healthcare Providers: SeriousBroker.it  This test is not yet approved or cleared by the [place] FDA and has been authorized for detection and/or diagnosis of SARS-CoV-2 by FDA under an Emergency Use Authorization (EUA). This EUA will remain in effect (meaning this test can be used) for the duration of the COVID-19 declaration under Section 564(b)(1) of the Act, 21 U.S.C. section 360bbb-3(b)(1), unless the authorization is terminated or revoked.     Resp Syncytial Virus  by PCR NEGATIVE NEGATIVE Final    Comment: (NOTE) Fact Sheet for Patients: BloggerCourse.com  Fact Sheet for Healthcare Providers: SeriousBroker.it  This test is not yet approved or cleared by the [place] FDA and has been authorized for detection and/or diagnosis of SARS-CoV-2 by FDA under an Emergency Use Authorization (EUA). This EUA will remain in effect (meaning this test can be used) for the duration of the COVID-19 declaration under Section 564(b)(1) of the Act, 21  U.S.C. section 360bbb-3(b)(1), unless the authorization is terminated or revoked.  Performed at [facility], [address] [place], [place] [address]          Radiology Studies: ECHOCARDIOGRAM COMPLETE  Result Date: 08/20/2022    ECHOCARDIOGRAM REPORT   Patient Name:   Karina [name] Date of Exam: 08/20/2022 Medical Rec #:  [id number]              Height:       65.0 in Accession #:    [id number]             Weight:       301.8 lb Date of Birth:  05/07/1981              BSA:          2.356 m Patient Age:    41 years               BP:           123/68 mmHg Patient Gender: F                      HR:           82 bpm. Exam Location:  Inpatient Procedure: 2D Echo, Cardiac Doppler, Color Doppler and Strain Analysis Indications:    Chest Pain R07.9  History:        Patient has prior history of Echocardiogram examinations, most                 recent 05/07/2017. Risk Factors:Sleep Apnea.  Sonographer:    Leta Jungling RDCS Referring Phys: [id number] VISHAL R PATEL IMPRESSIONS  1. Left ventricular ejection fraction, by estimation, is 60 to 65%. The left ventricle has normal function. The left ventricle has no regional wall motion abnormalities. Left ventricular diastolic parameters were normal. The average left ventricular global longitudinal strain is -24.9 %. The global longitudinal strain is normal.  2. Right ventricular systolic function is normal. The right ventricular size is normal.  3. The mitral valve is normal in structure. No evidence of mitral valve regurgitation. No evidence of mitral stenosis.  4. The aortic valve is tricuspid. Aortic valve regurgitation is not visualized. No aortic stenosis is present.  5. The inferior vena cava is normal in size with greater than 50% respiratory variability, suggesting right atrial pressure of 3 mmHg. Comparison(s): No significant change from prior study. Conclusion(s)/Recommendation(s): Normal biventricular function without  evidence of hemodynamically significant valvular heart disease. FINDINGS  Left Ventricle: Left ventricular ejection fraction, by estimation, is 60 to 65%. The left ventricle has normal function. The left ventricle has no regional wall motion abnormalities. The average left ventricular global longitudinal strain is -24.9 %. The global longitudinal strain is normal. The left ventricular internal cavity size was normal in size. There is no left ventricular hypertrophy. Left ventricular diastolic parameters were normal. Right Ventricle: The right ventricular size is normal. No increase in right ventricular wall thickness. Right ventricular systolic function  is normal. Left Atrium: Left atrial size was normal in size. Right Atrium: Right atrial size was normal in size. Pericardium: There is no evidence of pericardial effusion. Mitral Valve: The mitral valve is normal in structure. No evidence of mitral valve regurgitation. No evidence of mitral valve stenosis. Tricuspid Valve: The tricuspid valve is grossly normal. Tricuspid valve regurgitation is not demonstrated. No evidence of tricuspid stenosis. Aortic Valve: The aortic valve is tricuspid. Aortic valve regurgitation is not visualized. No aortic stenosis is present. Pulmonic Valve: The pulmonic valve was not well visualized. Pulmonic valve regurgitation is not visualized. No evidence of pulmonic stenosis. Aorta: The aortic root, ascending aorta, aortic arch and descending aorta are all structurally normal, with no evidence of dilitation or obstruction. Venous: The inferior vena cava is normal in size with greater than 50% respiratory variability, suggesting right atrial pressure of 3 mmHg. IAS/Shunts: No atrial level shunt detected by color flow Doppler.  LEFT VENTRICLE PLAX 2D LVIDd:         5.20 cm   Diastology LVIDs:         2.80 cm   LV e' medial:    12.80 cm/s LV PW:         0.70 cm   LV E/e' medial:  6.8 LV IVS:        0.70 cm   LV e' lateral:   14.10 cm/s LVOT  diam:     2.20 cm   LV E/e' lateral: 6.2 LV SV:         93 LV SV Index:   39        2D Longitudinal Strain LVOT Area:     3.80 cm  2D Strain GLS (A2C):   -25.7 %                          2D Strain GLS (A3C):   -23.8 %                          2D Strain GLS (A4C):   -25.3 %                          2D Strain GLS Avg:     -24.9 % RIGHT VENTRICLE RV S prime:     14.80 cm/s TAPSE (M-mode): 2.8 cm LEFT ATRIUM             Index        RIGHT ATRIUM           Index LA diam:        4.20 cm 1.78 cm/m   RA Area:     14.20 cm LA Vol (A2C):   20.5 ml 8.70 ml/m   RA Volume:   33.40 ml  14.18 ml/m LA Vol (A4C):   38.1 ml 16.17 ml/m LA Biplane Vol: 28.1 ml 11.93 ml/m  AORTIC VALVE LVOT Vmax:   123.00 cm/s LVOT Vmean:  78.900 cm/s LVOT VTI:    0.244 m  AORTA Ao Root diam: 2.70 cm Ao Asc diam:  2.90 cm MITRAL VALVE MV Area (PHT): 4.60 cm    SHUNTS MV Decel Time: 165 msec    Systemic VTI:  0.24 m MV E velocity: 87.00 cm/s  Systemic Diam: 2.20 cm MV A velocity: 66.00 cm/s MV E/A ratio:  1.32 Jodelle RedBridgette Christopher MD Electronically signed by Jodelle RedBridgette Christopher MD Signature Date/Time: 08/20/2022/4:56:52 PM  Final    CT Angio Chest PE W and/or Wo Contrast  Result Date: 08/19/2022 CLINICAL DATA:  Pulmonary embolism (PE) suspected, low to intermediate prob, positive D-dimer EXAM: CT ANGIOGRAPHY CHEST WITH CONTRAST TECHNIQUE: Multidetector CT imaging of the chest was performed using the standard protocol during bolus administration of intravenous contrast. Multiplanar CT image reconstructions and MIPs were obtained to evaluate the vascular anatomy. RADIATION DOSE REDUCTION: This exam was performed according to the departmental dose-optimization program which includes automated exposure control, adjustment of the mA and/or kV according to patient size and/or use of iterative reconstruction technique. CONTRAST:  72 mL Omnipaque 350non-ionic IV contrast. COMPARISON:  07/06/2022 FINDINGS: Cardiovascular: Satisfactory  opacification of the pulmonary arteries to the segmental level. No evidence of pulmonary embolism. Normal heart size. No pericardial effusion. Thoracic aorta unremarkable. Mediastinum/Nodes: No enlarged mediastinal, hilar, or axillary lymph nodes. Thyroid gland, trachea, and esophagus demonstrate no significant findings. Lungs/Pleura: Lungs are clear. No pleural effusion or pneumothorax. Upper Abdomen: No acute abnormality.  Lap band noted. Musculoskeletal: No chest wall abnormality. No acute or significant osseous findings. There are thoracic degenerative changes. Review of the MIP images confirms the above findings. IMPRESSION: No evidence of PE, aneurysm or dissection. Electronically Signed   By: Layla Maw M.D.   On: 08/19/2022 17:54   DG Chest 2 View  Result Date: 08/19/2022 CLINICAL DATA:  Chest pain and weakness EXAM: CHEST - 2 VIEW COMPARISON:  Chest radiograph dated 07/06/2022 FINDINGS: Normal lung volumes. No focal consolidations. No pleural effusion or pneumothorax. The heart size and mediastinal contours are within normal limits. The visualized skeletal structures are unremarkable. IMPRESSION: No active cardiopulmonary disease. Electronically Signed   By: Agustin Cree M.D.   On: 08/19/2022 15:22        Scheduled Meds:  enoxaparin (LOVENOX) injection  70 mg Subcutaneous Q24H   [START ON 08/21/2022] metoprolol tartrate  100 mg Oral Once   pantoprazole  40 mg Oral Daily   sodium chloride flush  3 mL Intravenous Q12H   Continuous Infusions:   LOS: 0 days    Time spent: 40 minutes    Ramiro Harvest, MD [facility]   To contact the attending provider between 7A-7P or the covering provider during after hours 7P-7A, please log into the web site [email or web address] and access using universal [place] password for that web site. If you do not have the password, please call the hospital operator.  08/20/2022, 4:58 PM

## 2022-08-20 NOTE — Assessment & Plan Note (Signed)
-  Urinalysis unremarkable.   -Place on Pyridium. -Supportive care.

## 2022-08-20 NOTE — Consult Note (Addendum)
Cardiology Consultation   Patient ID: Karina [name] MRN: [id number]; DOB: 1980-12-13  Admit date: 08/19/2022 Date of Consult: 08/20/2022  PCP:  Bailey Mech, PA-C   [facility] Providers Cardiologist:  Nicki Guadalajara, MD        Patient Profile:   Karina [name] is a 41 y.o. female with a hx of asthma, PCOS, history of lap band surgery in 2013 and iron deficiency/thalassemia trait who is being seen 08/20/2022 for the evaluation of chest pain at the request of Dr. Janee Morn.  History of Present Illness:   Karina [name] is a 41 year old female with past medical history of asthma, PCOS, history of lap band surgery in 2013 and iron deficiency/thalassemia trait.  She also has significant family history of cerebrovascular disease.  Patient was previously seen by Dr. Tomie China on 04/24/2017 for evaluation palpitation.  Echocardiogram obtained on 04/29/2017 showed EF 55 to 60%, no significant valve disease, overall normal echocardiogram.  48-hour Holter monitor obtained in August 2018 showed sinus rhythm with minimal heart rate 56, maximal heart rate 135, average heart rate 83 bpm, rare PAC and PVCs, however no significant finding.  In the past month, she has been having increased episodes of chest tightness in the substernal area accompanied by flushing and significant sweating episode.  Symptom lasted 30 to 40 minutes at a time and does not have direct correlation with degree of exertion.  During the episode, she sometimes would also have burping as well.  During the episode, she may also have palpitation.  Chest discomfort directly radiated to the back of the chest and may also have left shoulder and arm tingling sensation.  She would notice her blood pressure goes up during the episode.  She says her blood pressure is typically low, however during the chest pain episode, she has noticed systolic blood pressure goes up to 150s.  She saw Dr. Nicki Guadalajara of  cardiology service on 08/15/2022, given the atypical nature, echocardiogram and outpatient coronary CT was recommended.  Neither one has been done at this time.  She presented to [facility] on 08/19/2022 after having recurrent chest pain around 11:45 AM, this time symptoms did not go away for more than 2 hours prompting the patient to seek urgent medical attention.  Symptoms eventually went away.  Serial troponin negative.  CT of the chest was negative for PE.  Cardiology service consulted for chest pain.    Past Medical History:  Diagnosis Date   Anemia    takes iron daily   Anxiety    no meds   Asthma    albuterol as needed   Chronic bronchitis    bronchitis x 1 event in her twenties   Complication of anesthesia    decreased respirations with lap band sx   Dysrhythmia     " arrhythmia during endoscopy procedure "    Edema extremities    hx of edema in lower ext.used to take lasix - none since twenties   Gallbladder disease    GERD (gastroesophageal reflux disease)    nexium    Hemoglobin C trait (HCC)    History of chicken pox    HSV (herpes simplex virus) anogenital infection    Neuropathy    feet   Pre-diabetes    Pregnant    svd 06/19/17   Sleep apnea    Does not use cpap nightly   SVD (spontaneous vaginal delivery)    x 2  Wears glasses     Past Surgical History:  Procedure Laterality Date   BIOPSY  07/14/2020   Procedure: BIOPSY;  Surgeon: Charna Elizabeth, MD;  Location: [facility];  Service: Endoscopy;;   CARPAL TUNNEL RELEASE Bilateral 2008, 2010   CHOLECYSTECTOMY  01/11/2012   Procedure: LAPAROSCOPIC CHOLECYSTECTOMY WITH INTRAOPERATIVE CHOLANGIOGRAM;  Surgeon: Cherylynn Ridges, MD;  Location: [facility] OR;  Service: General;  Laterality: N/A;   COLONOSCOPY     COLONOSCOPY WITH PROPOFOL N/A 07/14/2020   Procedure: COLONOSCOPY WITH PROPOFOL;  Surgeon: Charna Elizabeth, MD;  Location: [facility];  Service: Endoscopy;  Laterality: N/A;   DILATION AND EVACUATION N/A  03/24/2020   Procedure: DILATATION AND EVACUATION;  Surgeon: Hoover Browns, MD;  Location: [facility] OR;  Service: Gynecology;  Laterality: N/A;   LAPAROSCOPIC BILATERAL SALPINGECTOMY Right 08/14/2017   Procedure: LAPAROSCOPIC RIGHT  SALPINGECTOMY;  Surgeon: Silverio Lay, MD;  Location: [facility];  Service: Gynecology;  Laterality: Right;   LAPAROSCOPIC GASTRIC BANDING     UPPER GI ENDOSCOPY     WISDOM TOOTH EXTRACTION       Home Medications:  Prior to Admission medications   Medication Sig Start Date End Date Taking? Authorizing Provider  acetaminophen (TYLENOL) 500 MG tablet Take 1,000 mg by mouth every 6 (six) hours as needed for moderate pain.   Yes [provider]  albuterol (VENTOLIN HFA) 108 (90 Base) MCG/ACT inhaler Inhale 2 puffs into the lungs every 4 (four) hours as needed for wheezing. 03/16/22  Yes   ALPRAZolam (XANAX) 0.5 MG tablet Take 1 tablet (0.5 mg total) by mouth daily as needed for anxiety 03/16/22  Yes   Calcipotriene-Betameth Diprop (ENSTILAR) 0.005-0.064 % FOAM Apply 1 application topically daily. 04/11/22  Yes Sheffield, Kelli R, PA-C  calcium carbonate (TUMS EX) 750 MG chewable tablet Chew 2 tablets by mouth daily as needed for heartburn.   Yes [provider]  furosemide (LASIX) 20 MG tablet Take 1 tablet (20 mg total) by mouth every other day. Patient taking differently: Take 20 mg by mouth daily as needed for fluid. 03/16/22  Yes   ibuprofen (ADVIL) 200 MG tablet Take 200 mg by mouth every 6 (six) hours as needed for moderate pain.   Yes [provider]  tiZANidine (ZANAFLEX) 2 MG tablet Take 1-2 tablets (2-4 mg total) by mouth 3 (three) times daily as needed. Patient taking differently: Take 2-4 mg by mouth 3 (three) times daily as needed for muscle spasms. 03/16/22  Yes   imiquimod (ALDARA) 5 % cream Apply topically 3 (three) times a week at bedtime until warts completely disappear up to a maximum of 12 weeks. Patient not taking: Reported on 08/20/2022 03/16/22      influenza vac split quadrivalent PF (FLUARIX) 0.5 ML injection Inject into the muscle. 07/09/22     metoprolol tartrate (LOPRESSOR) 100 MG tablet Take 1 tablet (100 mg total) by mouth once for 1 dose. 2 hours prior to your CT 08/15/22 08/16/22  Lennette Bihari, MD  VENTOLIN HFA 108 [phone] Base) MCG/ACT inhaler Inhale 2 puffs into the lungs every 4 hours as needed for wheezing Patient not taking: Reported on 08/20/2022 03/14/21       Inpatient Medications: Scheduled Meds:  enoxaparin (LOVENOX) injection  70 mg Subcutaneous Q24H   pantoprazole  40 mg Oral Daily   sodium chloride flush  3 mL Intravenous Q12H   Continuous Infusions:  PRN Meds: acetaminophen **OR** acetaminophen, albuterol, alum & mag hydroxide-simeth, morphine injection, nitroGLYCERIN, ondansetron **OR** ondansetron (ZOFRAN)  IV, oxyCODONE, senna-docusate  Allergies:   No Known Allergies  Social History:   Social History   Socioeconomic History   Marital status: Married    Spouse name: Not on file   Number of children: 1   Years of education: college   Highest education level: Not on file  Occupational History   Not on file  Tobacco Use   Smoking status: Former    Packs/day: 0.25    Years: 1.00    Total pack years: 0.25    Types: Cigars, Cigarettes    Quit date: 09/11/1995    Years since quitting: 26.9   Smokeless tobacco: Never  Vaping Use   Vaping Use: Never used  Substance and Sexual Activity   Alcohol use: Not Currently    Comment: socially-none since pregnancy - SVD 06/19/17   Drug use: No   Sexual activity: Not Currently    Birth control/protection: None    Comment: 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 Deirdre Priest in [place]   Drinks 1-2 cups of coffee a day    Social Determinants of Health   Financial Resource Strain: Not on file  Food Insecurity: No Food Insecurity (08/19/2022)   Hunger Vital Sign    Worried  About Running Out of Food in the Last Year: Never true    Ran Out of Food in the Last Year: Never true  Transportation Needs: No Transportation Needs (08/19/2022)   PRAPARE - Administrator, Civil Service (Medical): No    Lack of Transportation (Non-Medical): No  Physical Activity: Not on file  Stress: Not on file  Social Connections: Not on file  Intimate Partner Violence: Not At Risk (08/19/2022)   Humiliation, Afraid, Rape, and Kick questionnaire    Fear of Current or Ex-Partner: No    Emotionally Abused: No    Physically Abused: No    Sexually Abused: No    Family History:    Family History  Problem Relation Age of Onset   Diabetes Sister    Asthma Sister    Heart disease Other    Hyperlipidemia Other    Hypertension Other    Stroke Other    Kidney disease Other    Thyroid disease Other    Ovarian cancer Maternal Aunt    Prostate cancer Maternal Uncle    Cervical cancer Maternal Aunt        cervial and thyroid   Liver cancer Paternal Grandfather    Prostate cancer Paternal Grandfather    Liver cancer Paternal Uncle    Anesthesia problems Mother    Asthma Maternal Grandmother    Hypotension Neg Hx    Malignant hyperthermia Neg Hx    Pseudochol deficiency Neg Hx      ROS:  Please see the history of present illness.   All other ROS reviewed and negative.     Physical Exam/Data:   Vitals:   08/19/22 1956 08/19/22 2334 08/20/22 0340 08/20/22 0745  BP:  119/66 117/70 114/64  Pulse:  81 89 77  Resp:  (!) Temp:  97.9 F (36.6 C) 98.6 F (37 C) 98.6 F (37 C)  TempSrc:  Oral Oral Oral  SpO2:  99% 97% 99%  Weight: (!) 136.9 kg     Height:  (1.651 m)      No intake or output data in the 24 hours ending  08/20/22 0758    08/19/2022    7:56 PM 08/15/2022    3:40 PM 03/09/2022   11:45 AM  Last 3 Weights  Weight (lbs) 301 lb 13 oz 311 lb 12.8 oz 320 lb  Weight (kg) 136.9 kg 141.432 kg 145.151 kg     Body mass index is 50.22 kg/m.   General:  Well nourished, well developed, in no acute distress HEENT: normal Neck: no JVD Vascular: No carotid bruits; Distal pulses 2+ bilaterally Cardiac:  normal S1, S2; RRR; no murmur  Lungs:  clear to auscultation bilaterally, no wheezing, rhonchi or rales  Abd: soft, nontender, no hepatomegaly  Ext: no edema Musculoskeletal:  No deformities, BUE and BLE strength normal and equal Skin: warm and dry  Neuro:  CNs 2-12 intact, no focal abnormalities noted Psych:  Normal affect   EKG:  The EKG was personally reviewed and demonstrates: Normal sinus rhythm, no significant ST-T wave changes Telemetry:  Telemetry was personally reviewed and demonstrates: Normal sinus rhythm, no significant ventricular ectopy  Relevant CV Studies:  Echo 05/07/2017 LV EF: 55% -   60%  Study Conclusions   - Left ventricle: The cavity size was normal. Wall thickness was    normal. Systolic function was normal. The estimated ejection    fraction was in the range of 55% to 60%. Wall motion was normal;    there were no regional wall motion abnormalities. Left    ventricular diastolic function parameters were normal.  - Aortic valve: There was no stenosis.  - Mitral valve: There was no significant regurgitation.  - Right ventricle: The cavity size was normal. Systolic function    was normal.  - Tricuspid valve: Peak RV-RA gradient (S): 21 mm Hg.  - Pulmonary arteries: PA peak pressure: 24 mm Hg (S).  - Inferior vena cava: The vessel was normal in size. The    respirophasic diameter changes were in the normal range (>= 50%),    consistent with normal central venous pressure.   Impressions:   - Normal study.    Laboratory Data:  High Sensitivity Troponin:   Recent Labs  Lab 08/19/22 1429 08/19/22 1632  TROPONINIHS 4 4     Chemistry Recent Labs  Lab 08/19/22 1429 08/20/22 0444  NA 132* 134*  K 3.7 3.5  CL 100 106  CO2 22 21*  GLUCOSE 98 109*  BUN 11 10  CREATININE 0.70 0.74  CALCIUM  9.2 8.5*  GFRNONAA >60 >60  ANIONGAP 10 7    No results for input(s): "PROT", "ALBUMIN", "AST", "ALT", "ALKPHOS", "BILITOT" in the last 168 hours. Lipids No results for input(s): "CHOL", "TRIG", "HDL", "LABVLDL", "LDLCALC", "CHOLHDL" in the last 168 hours.  Hematology Recent Labs  Lab 08/19/22 1429  WBC 13.8*  RBC 5.00  HGB 11.1*  HCT 33.9*  MCV 67.8*  MCH 22.2*  MCHC 32.7  RDW 16.5*  PLT 353   Thyroid No results for input(s): "TSH", "FREET4" in the last 168 hours.  BNP Recent Labs  Lab 08/19/22 1429  BNP 27.9    DDimer  Recent Labs  Lab 08/19/22 1632  DDIMER 0.74*     Radiology/Studies:  CT Angio Chest PE W and/or Wo Contrast  Result Date: 08/19/2022 CLINICAL DATA:  Pulmonary embolism (PE) suspected, low to intermediate prob, positive D-dimer EXAM: CT ANGIOGRAPHY CHEST WITH CONTRAST TECHNIQUE: Multidetector CT imaging of the chest was performed using the standard protocol during bolus administration of intravenous contrast. Multiplanar CT image reconstructions and MIPs were obtained to  evaluate the vascular anatomy. RADIATION DOSE REDUCTION: This exam was performed according to the departmental dose-optimization program which includes automated exposure control, adjustment of the mA and/or kV according to patient size and/or use of iterative reconstruction technique. CONTRAST:  72 mL Omnipaque 350non-ionic IV contrast. COMPARISON:  07/06/2022 FINDINGS: Cardiovascular: Satisfactory opacification of the pulmonary arteries to the segmental level. No evidence of pulmonary embolism. Normal heart size. No pericardial effusion. Thoracic aorta unremarkable. Mediastinum/Nodes: No enlarged mediastinal, hilar, or axillary lymph nodes. Thyroid gland, trachea, and esophagus demonstrate no significant findings. Lungs/Pleura: Lungs are clear. No pleural effusion or pneumothorax. Upper Abdomen: No acute abnormality.  Lap band noted. Musculoskeletal: No chest wall abnormality. No acute or  significant osseous findings. There are thoracic degenerative changes. Review of the MIP images confirms the above findings. IMPRESSION: No evidence of PE, aneurysm or dissection. Electronically Signed   By: Layla Maw M.D.   On: 08/19/2022 17:54   DG Chest 2 View  Result Date: 08/19/2022 CLINICAL DATA:  Chest pain and weakness EXAM: CHEST - 2 VIEW COMPARISON:  Chest radiograph dated 07/06/2022 FINDINGS: Normal lung volumes. No focal consolidations. No pleural effusion or pneumothorax. The heart size and mediastinal contours are within normal limits. The visualized skeletal structures are unremarkable. IMPRESSION: No active cardiopulmonary disease. Electronically Signed   By: Agustin Cree M.D.   On: 08/19/2022 15:22     Assessment and Plan:   Chest pain  -Very atypical presentation of chest pain radiating to the back, accompanied by flushing sensation, blood pressure elevation and palpitation.  Symptom has been going on for a month.  Yesterday's episode lasted longer than 2 hours with negative troponin x 2.  Pregnancy test negative.  A D-dimer 0.74.  CTA was negative for PE.  Patient was seen by Dr. Tresa Endo on 08/16/2022 who recommended echocardiogram and coronary CT given the atypical nature of her symptoms, both has been scheduled but not done yet.  Echocardiogram has been reordered during this admission.  Will discuss with MD to obtain coronary CT during this admission as well.  Given palpitation and onset of flushing sensation in this 41 year old, consider FSH and luteinizing hormone to make sure patient is not in early perimenopause.   Risk Assessment/Risk Scores:   For questions or updates, please contact [facility] Please consult [email or web address] for contact info under  Signed, Azalee Course, [place]  08/20/2022 7:58 AM  Patient seen and examined, note reviewed with the signed Advanced Practice Provider. I personally reviewed laboratory data, imaging studies and relevant notes. I  independently examined the patient and formulated the important aspects of the plan. I have personally discussed the plan with the patient and/or family. Comments or changes to the note/plan are indicated below.  Patient seen examined at her bedside.  She was sitting up in bed when I arrived. Currently she is not experiencing any chest pain.  Her chest pain is somewhat atypical but given the intermittent persistent nature we will be beneficial to get a coronary CTA.  She is agreeable for this.  In addition she will benefit from an echocardiogram to assess her heart function and for any other structural abnormalities.  I agree that with a history of PCOS she could have other hormonal dysfunction it would be of benefit to get [facility] and also consider adrenal workup.  Hemoglobin A1c does not show any evidence of diabetes or prediabetes this was done in June 2023.  Lipid profile LDL 97, HDL 36, total cholesterol 161 and triglyceride 41-no  need for statin medication at this time but advised diet ,modification to support improving HDL.  The patient understands the need to lose weight with diet and exercise. We have discussed specific strategies for this.  Thomasene Ripple DO, MS [facility] Attending Cardiologist [facility]  [address] [place], [place] [address] [phone] Website: [email or web address]

## 2022-08-20 NOTE — Progress Notes (Signed)
  Echocardiogram 2D Echocardiogram has been performed.  Karina [name] 08/20/2022, 3:13 PM

## 2022-08-21 ENCOUNTER — Observation Stay (HOSPITAL_COMMUNITY)
Admit: 2022-08-21 | Discharge: 2022-08-21 | Disposition: A | Discharge status: Home / Self Care | Payer: 59 | Attending: Cardiovascular Disease | Admitting: Cardiovascular Disease

## 2022-08-21 ENCOUNTER — Observation Stay (HOSPITAL_COMMUNITY): Payer: 59

## 2022-08-21 ENCOUNTER — Inpatient Hospital Stay (HOSPITAL_COMMUNITY): Payer: 59

## 2022-08-21 DIAGNOSIS — Z9049 Acquired absence of other specified parts of digestive tract: Secondary | ICD-10-CM | POA: Diagnosis not present

## 2022-08-21 DIAGNOSIS — K7460 Unspecified cirrhosis of liver: Secondary | ICD-10-CM | POA: Diagnosis present

## 2022-08-21 DIAGNOSIS — J4489 Other specified chronic obstructive pulmonary disease: Secondary | ICD-10-CM | POA: Diagnosis present

## 2022-08-21 DIAGNOSIS — I959 Hypotension, unspecified: Secondary | ICD-10-CM | POA: Diagnosis present

## 2022-08-21 DIAGNOSIS — K8050 Calculus of bile duct without cholangitis or cholecystitis without obstruction: Secondary | ICD-10-CM | POA: Diagnosis not present

## 2022-08-21 DIAGNOSIS — D509 Iron deficiency anemia, unspecified: Secondary | ICD-10-CM | POA: Diagnosis present

## 2022-08-21 DIAGNOSIS — R079 Chest pain, unspecified: Secondary | ICD-10-CM | POA: Diagnosis not present

## 2022-08-21 DIAGNOSIS — R072 Precordial pain: Secondary | ICD-10-CM | POA: Diagnosis not present

## 2022-08-21 DIAGNOSIS — J45909 Unspecified asthma, uncomplicated: Secondary | ICD-10-CM | POA: Diagnosis not present

## 2022-08-21 DIAGNOSIS — Z8249 Family history of ischemic heart disease and other diseases of the circulatory system: Secondary | ICD-10-CM | POA: Diagnosis not present

## 2022-08-21 DIAGNOSIS — D508 Other iron deficiency anemias: Secondary | ICD-10-CM | POA: Diagnosis not present

## 2022-08-21 DIAGNOSIS — Z6841 Body Mass Index (BMI) 40.0 and over, adult: Secondary | ICD-10-CM | POA: Diagnosis not present

## 2022-08-21 DIAGNOSIS — Z833 Family history of diabetes mellitus: Secondary | ICD-10-CM | POA: Diagnosis not present

## 2022-08-21 DIAGNOSIS — F418 Other specified anxiety disorders: Secondary | ICD-10-CM | POA: Diagnosis not present

## 2022-08-21 DIAGNOSIS — R1013 Epigastric pain: Secondary | ICD-10-CM | POA: Diagnosis not present

## 2022-08-21 DIAGNOSIS — R932 Abnormal findings on diagnostic imaging of liver and biliary tract: Secondary | ICD-10-CM | POA: Diagnosis not present

## 2022-08-21 DIAGNOSIS — F419 Anxiety disorder, unspecified: Secondary | ICD-10-CM | POA: Diagnosis present

## 2022-08-21 DIAGNOSIS — R300 Dysuria: Secondary | ICD-10-CM | POA: Diagnosis not present

## 2022-08-21 DIAGNOSIS — F32A Depression, unspecified: Secondary | ICD-10-CM | POA: Diagnosis present

## 2022-08-21 DIAGNOSIS — Z8041 Family history of malignant neoplasm of ovary: Secondary | ICD-10-CM | POA: Diagnosis not present

## 2022-08-21 DIAGNOSIS — R7401 Elevation of levels of liver transaminase levels: Secondary | ICD-10-CM | POA: Diagnosis not present

## 2022-08-21 DIAGNOSIS — D72829 Elevated white blood cell count, unspecified: Secondary | ICD-10-CM | POA: Diagnosis not present

## 2022-08-21 DIAGNOSIS — K219 Gastro-esophageal reflux disease without esophagitis: Secondary | ICD-10-CM | POA: Diagnosis present

## 2022-08-21 DIAGNOSIS — Z823 Family history of stroke: Secondary | ICD-10-CM | POA: Diagnosis not present

## 2022-08-21 DIAGNOSIS — R0789 Other chest pain: Secondary | ICD-10-CM

## 2022-08-21 DIAGNOSIS — Z87891 Personal history of nicotine dependence: Secondary | ICD-10-CM | POA: Diagnosis not present

## 2022-08-21 DIAGNOSIS — I200 Unstable angina: Secondary | ICD-10-CM | POA: Diagnosis present

## 2022-08-21 DIAGNOSIS — Z1152 Encounter for screening for COVID-19: Secondary | ICD-10-CM | POA: Diagnosis not present

## 2022-08-21 DIAGNOSIS — Z9884 Bariatric surgery status: Secondary | ICD-10-CM | POA: Diagnosis not present

## 2022-08-21 DIAGNOSIS — E282 Polycystic ovarian syndrome: Secondary | ICD-10-CM | POA: Diagnosis present

## 2022-08-21 DIAGNOSIS — Z825 Family history of asthma and other chronic lower respiratory diseases: Secondary | ICD-10-CM | POA: Diagnosis not present

## 2022-08-21 DIAGNOSIS — D563 Thalassemia minor: Secondary | ICD-10-CM | POA: Diagnosis present

## 2022-08-21 DIAGNOSIS — Z8049 Family history of malignant neoplasm of other genital organs: Secondary | ICD-10-CM | POA: Diagnosis not present

## 2022-08-21 DIAGNOSIS — J4520 Mild intermittent asthma, uncomplicated: Secondary | ICD-10-CM | POA: Diagnosis not present

## 2022-08-21 LAB — HEPATIC FUNCTION PANEL
ALT: 162 U/L — ABNORMAL HIGH (ref 0–44)
AST: 153 U/L — ABNORMAL HIGH (ref 15–41)
Albumin: 2.5 g/dL — ABNORMAL LOW (ref 3.5–5.0)
Alkaline Phosphatase: 340 U/L — ABNORMAL HIGH (ref 38–126)
Bilirubin, Direct: 1.6 mg/dL — ABNORMAL HIGH (ref 0.0–0.2)
Indirect Bilirubin: 2.1 mg/dL — ABNORMAL HIGH (ref 0.3–0.9)
Total Bilirubin: 3.7 mg/dL — ABNORMAL HIGH (ref 0.3–1.2)
Total Protein: 8.1 g/dL (ref 6.5–8.1)

## 2022-08-21 LAB — CBC WITH DIFFERENTIAL/PLATELET
Abs Immature Granulocytes: 0.03 10*3/uL (ref 0.00–0.07)
Basophils Absolute: 0.1 10*3/uL (ref 0.0–0.1)
Basophils Relative: 1 %
Eosinophils Absolute: 0.5 10*3/uL (ref 0.0–0.5)
Eosinophils Relative: 6 %
HCT: 33.4 % — ABNORMAL LOW (ref 36.0–46.0)
Hemoglobin: 10.8 g/dL — ABNORMAL LOW (ref 12.0–15.0)
Immature Granulocytes: 0 %
Lymphocytes Relative: 32 %
Lymphs Abs: 2.9 10*3/uL (ref 0.7–4.0)
MCH: 22.5 pg — ABNORMAL LOW (ref 26.0–34.0)
MCHC: 32.3 g/dL (ref 30.0–36.0)
MCV: 69.4 fL — ABNORMAL LOW (ref 80.0–100.0)
Monocytes Absolute: 0.6 10*3/uL (ref 0.1–1.0)
Monocytes Relative: 7 %
Neutro Abs: 5.1 10*3/uL (ref 1.7–7.7)
Neutrophils Relative %: 54 %
Platelets: 330 10*3/uL (ref 150–400)
RBC: 4.81 MIL/uL (ref 3.87–5.11)
RDW: 16.8 % — ABNORMAL HIGH (ref 11.5–15.5)
WBC: 9.3 10*3/uL (ref 4.0–10.5)
nRBC: 0 % (ref 0.0–0.2)

## 2022-08-21 LAB — COMPREHENSIVE METABOLIC PANEL
ALT: 164 U/L — ABNORMAL HIGH (ref 0–44)
AST: 151 U/L — ABNORMAL HIGH (ref 15–41)
Albumin: 2.8 g/dL — ABNORMAL LOW (ref 3.5–5.0)
Alkaline Phosphatase: 339 U/L — ABNORMAL HIGH (ref 38–126)
Anion gap: 4 — ABNORMAL LOW (ref 5–15)
BUN: 10 mg/dL (ref 6–20)
CO2: 22 mmol/L (ref 22–32)
Calcium: 8.4 mg/dL — ABNORMAL LOW (ref 8.9–10.3)
Chloride: 104 mmol/L (ref 98–111)
Creatinine, Ser: 0.69 mg/dL (ref 0.44–1.00)
GFR, Estimated: >60 mL/min (ref >60)
Glucose, Bld: 97 mg/dL (ref 70–99)
Potassium: 5.2 mmol/L — ABNORMAL HIGH (ref 3.5–5.1)
Sodium: 130 mmol/L — ABNORMAL LOW (ref 135–145)
Total Bilirubin: 3.9 mg/dL — ABNORMAL HIGH (ref 0.3–1.2)
Total Protein: 9 g/dL — ABNORMAL HIGH (ref 6.5–8.1)

## 2022-08-21 LAB — URINE CULTURE

## 2022-08-21 LAB — MAGNESIUM: Magnesium: 2.2 mg/dL (ref 1.7–2.4)

## 2022-08-21 MED ORDER — GADOBUTROL 1 MMOL/ML IV SOLN
10.0000 mL | Freq: Once | INTRAVENOUS | Status: AC | PRN
  Administered 2022-08-21: 10 mL via INTRAVENOUS

## 2022-08-21 MED ORDER — NITROGLYCERIN 0.4 MG SL SUBL
0.8000 mg | SUBLINGUAL_TABLET | Freq: Once | SUBLINGUAL | Status: AC
  Administered 2022-08-21: 0.8 mg via SUBLINGUAL

## 2022-08-21 MED ORDER — PHENAZOPYRIDINE HCL 200 MG PO TABS
200.0000 mg | ORAL_TABLET | Freq: Three times a day (TID) | ORAL | Status: DC
  Administered 2022-08-21 – 2022-08-23 (×4): 200 mg via ORAL
  Filled 2022-08-21 (×8): qty 1

## 2022-08-21 MED ORDER — NITROGLYCERIN 0.4 MG SL SUBL
SUBLINGUAL_TABLET | SUBLINGUAL | Status: AC
  Filled 2022-08-21: qty 2

## 2022-08-21 MED ORDER — SODIUM CHLORIDE 0.9 % IV SOLN: INTRAVENOUS | Status: DC

## 2022-08-21 MED ORDER — IOHEXOL 350 MG/ML SOLN
115.0000 mL | Freq: Once | INTRAVENOUS | Status: AC | PRN
  Administered 2022-08-21: 115 mL via INTRAVENOUS

## 2022-08-21 NOTE — Progress Notes (Addendum)
Rounding Note    Patient Name: Karina [name] Date of Encounter: 08/21/2022  [facility] Cardiologist: Nicki Guadalajara, MD   Subjective   Denies any further chest pain last night. No significant SOB.   Inpatient Medications    Scheduled Meds:  enoxaparin (LOVENOX) injection  70 mg Subcutaneous Q24H   pantoprazole  40 mg Oral Daily   sodium chloride flush  3 mL Intravenous Q12H   Continuous Infusions:  PRN Meds: acetaminophen **OR** acetaminophen, albuterol, ALPRAZolam, alum & mag hydroxide-simeth **AND** lidocaine, morphine injection, nitroGLYCERIN, ondansetron **OR** ondansetron (ZOFRAN) IV, senna-docusate, tiZANidine   Vital Signs    Vitals:   08/20/22 0745 08/20/22 1327 08/20/22 2013 08/21/22 0526  BP: 114/64 123/68 117/61 115/61  Pulse: 77 72 81 87  Resp: 18 18 17 20   Temp: 98.6 F (37 C) 98.1 F (36.7 C) 98.3 F (36.8 C) 97.9 F (36.6 C)  TempSrc: Oral Oral Oral Oral  SpO2: 99% 100% 99% 97%  Weight:      Height:        Intake/Output Summary (Last 24 hours) at 08/21/2022 0806 Last data filed at 08/20/2022 2213 Gross per 24 hour  Intake 3 ml  Output --  Net 3 ml      08/19/2022    7:56 PM 08/15/2022    3:40 PM 03/09/2022   11:45 AM  Last 3 Weights  Weight (lbs) 301 lb 13 oz 311 lb 12.8 oz 320 lb  Weight (kg) 136.9 kg 141.432 kg 145.151 kg      Telemetry    NSR, HR 70s overnight, dropped down to 50s this morning (after the 100mg  metoprolol intended for coronary CT) - Personally Reviewed  ECG    NSR without significant ST-T wave changes - Personally Reviewed  Physical Exam   GEN: No acute distress.   Neck: No JVD Cardiac: RRR, no murmurs, rubs, or gallops.  Respiratory: Clear to auscultation bilaterally. GI: Soft, nontender, non-distended  MS: No edema; No deformity. Neuro:  Nonfocal  Psych: Normal affect   Labs    High Sensitivity Troponin:   Recent Labs  Lab 08/19/22 1429 08/19/22 1632  TROPONINIHS 4 4      Chemistry Recent Labs  Lab 08/19/22 1429 08/20/22 0444  NA 132* 134*  K 3.7 3.5  CL 100 106  CO2 22 21*  GLUCOSE 98 109*  BUN 11 10  CREATININE 0.70 0.74  CALCIUM 9.2 8.5*  MG  --  1.8  GFRNONAA >60 >60  ANIONGAP 10 7    Lipids  Recent Labs  Lab 08/20/22 0444  CHOL 141  TRIG 41  HDL 36*  LDLCALC 97  CHOLHDL 3.9    Hematology Recent Labs  Lab 08/19/22 1429 08/20/22 1142  WBC 13.8* 10.0  RBC 5.00 4.95  HGB 11.1* 11.0*  HCT 33.9* 34.2*  MCV 67.8* 69.1*  MCH 22.2* 22.2*  MCHC 32.7 32.2  RDW 16.5* 16.7*  PLT 353 331   Thyroid No results for input(s): "TSH", "FREET4" in the last 168 hours.  BNP Recent Labs  Lab 08/19/22 1429  BNP 27.9    DDimer  Recent Labs  Lab 08/19/22 1632  DDIMER 0.74*     Radiology    ECHOCARDIOGRAM COMPLETE  Result Date: 08/20/2022    ECHOCARDIOGRAM REPORT   Patient Name:   Karina [name] Date of Exam: 08/20/2022 Medical Rec #:  14/07/2022              Height:  65.0 in Accession #:    [id number]             Weight:       301.8 lb Date of Birth:  [birth year]              BSA:          2.356 m Patient Age:    55 years               BP:           123/68 mmHg Patient Gender: F                      HR:           82 bpm. Exam Location:  Inpatient Procedure: 2D Echo, Cardiac Doppler, Color Doppler and Strain Analysis Indications:    Chest Pain R07.9  History:        Patient has prior history of Echocardiogram examinations, most                 recent 05/07/2017. Risk Factors:Sleep Apnea.  Sonographer:    Darlina Sicilian RDCS Referring Phys: [id number] [place]  1. Left ventricular ejection fraction, by estimation, is 60 to 65%. The left ventricle has normal function. The left ventricle has no regional wall motion abnormalities. Left ventricular diastolic parameters were normal. The average left ventricular global longitudinal strain is -24.9 %. The global longitudinal strain is normal.  2. Right ventricular systolic  function is normal. The right ventricular size is normal.  3. The mitral valve is normal in structure. No evidence of mitral valve regurgitation. No evidence of mitral stenosis.  4. The aortic valve is tricuspid. Aortic valve regurgitation is not visualized. No aortic stenosis is present.  5. The inferior vena cava is normal in size with greater than 50% respiratory variability, suggesting right atrial pressure of 3 mmHg. Comparison(s): No significant change from prior study. Conclusion(s)/Recommendation(s): Normal biventricular function without evidence of hemodynamically significant valvular heart disease. FINDINGS  Left Ventricle: Left ventricular ejection fraction, by estimation, is 60 to 65%. The left ventricle has normal function. The left ventricle has no regional wall motion abnormalities. The average left ventricular global longitudinal strain is -24.9 %. The global longitudinal strain is normal. The left ventricular internal cavity size was normal in size. There is no left ventricular hypertrophy. Left ventricular diastolic parameters were normal. Right Ventricle: The right ventricular size is normal. No increase in right ventricular wall thickness. Right ventricular systolic function is normal. Left Atrium: Left atrial size was normal in size. Right Atrium: Right atrial size was normal in size. Pericardium: There is no evidence of pericardial effusion. Mitral Valve: The mitral valve is normal in structure. No evidence of mitral valve regurgitation. No evidence of mitral valve stenosis. Tricuspid Valve: The tricuspid valve is grossly normal. Tricuspid valve regurgitation is not demonstrated. No evidence of tricuspid stenosis. Aortic Valve: The aortic valve is tricuspid. Aortic valve regurgitation is not visualized. No aortic stenosis is present. Pulmonic Valve: The pulmonic valve was not well visualized. Pulmonic valve regurgitation is not visualized. No evidence of pulmonic stenosis. Aorta: The aortic  root, ascending aorta, aortic arch and descending aorta are all structurally normal, with no evidence of dilitation or obstruction. Venous: The inferior vena cava is normal in size with greater than 50% respiratory variability, suggesting right atrial pressure of 3 mmHg. IAS/Shunts: No atrial level shunt detected by color flow Doppler.  LEFT VENTRICLE PLAX 2D  LVIDd:         5.20 cm   Diastology LVIDs:         2.80 cm   LV e' medial:    12.80 cm/s LV PW:         0.70 cm   LV E/e' medial:  6.8 LV IVS:        0.70 cm   LV e' lateral:   14.10 cm/s LVOT diam:     2.20 cm   LV E/e' lateral: 6.2 LV SV:         93 LV SV Index:   39        2D Longitudinal Strain LVOT Area:     3.80 cm  2D Strain GLS (A2C):   -25.7 %                          2D Strain GLS (A3C):   -23.8 %                          2D Strain GLS (A4C):   -25.3 %                          2D Strain GLS Avg:     -24.9 % RIGHT VENTRICLE RV S prime:     14.80 cm/s TAPSE (M-mode): 2.8 cm LEFT ATRIUM             Index        RIGHT ATRIUM           Index LA diam:        4.20 cm 1.78 cm/m   RA Area:     14.20 cm LA Vol (A2C):   20.5 ml 8.70 ml/m   RA Volume:   33.40 ml  14.18 ml/m LA Vol (A4C):   38.1 ml 16.17 ml/m LA Biplane Vol: 28.1 ml 11.93 ml/m  AORTIC VALVE LVOT Vmax:   123.00 cm/s LVOT Vmean:  78.900 cm/s LVOT VTI:    0.244 m  AORTA Ao Root diam: 2.70 cm Ao Asc diam:  2.90 cm MITRAL VALVE MV Area (PHT): 4.60 cm    SHUNTS MV Decel Time: 165 msec    Systemic VTI:  0.24 m MV E velocity: 87.00 cm/s  Systemic Diam: 2.20 cm MV A velocity: 66.00 cm/s MV E/A ratio:  1.32 Buford Dresser MD Electronically signed by Buford Dresser MD Signature Date/Time: 08/20/2022/4:56:52 PM    Final    CT Angio Chest PE W and/or Wo Contrast  Result Date: 08/19/2022 CLINICAL DATA:  Pulmonary embolism (PE) suspected, low to intermediate prob, positive D-dimer EXAM: CT ANGIOGRAPHY CHEST WITH CONTRAST TECHNIQUE: Multidetector CT imaging of the chest was performed  using the standard protocol during bolus administration of intravenous contrast. Multiplanar CT image reconstructions and MIPs were obtained to evaluate the vascular anatomy. RADIATION DOSE REDUCTION: This exam was performed according to the departmental dose-optimization program which includes automated exposure control, adjustment of the mA and/or kV according to patient size and/or use of iterative reconstruction technique. CONTRAST:  72 mL Omnipaque 350non-ionic IV contrast. COMPARISON:  07/06/2022 FINDINGS: Cardiovascular: Satisfactory opacification of the pulmonary arteries to the segmental level. No evidence of pulmonary embolism. Normal heart size. No pericardial effusion. Thoracic aorta unremarkable. Mediastinum/Nodes: No enlarged mediastinal, hilar, or axillary lymph nodes. Thyroid gland, trachea, and esophagus demonstrate no significant findings. Lungs/Pleura: Lungs are clear. No pleural effusion  or pneumothorax. Upper Abdomen: No acute abnormality.  Lap band noted. Musculoskeletal: No chest wall abnormality. No acute or significant osseous findings. There are thoracic degenerative changes. Review of the MIP images confirms the above findings. IMPRESSION: No evidence of PE, aneurysm or dissection. Electronically Signed   By: Sammie Bench M.D.   On: 08/19/2022 17:54   DG Chest 2 View  Result Date: 08/19/2022 CLINICAL DATA:  Chest pain and weakness EXAM: CHEST - 2 VIEW COMPARISON:  Chest radiograph dated 07/06/2022 FINDINGS: Normal lung volumes. No focal consolidations. No pleural effusion or pneumothorax. The heart size and mediastinal contours are within normal limits. The visualized skeletal structures are unremarkable. IMPRESSION: No active cardiopulmonary disease. Electronically Signed   By: Darrin Nipper M.D.   On: 08/19/2022 15:22    Cardiac Studies   CCTA 08/21/2022  FINDINGS: Image quality: Average.  Slab artifact noted.   Noise artifact is: Limited.   Coronary Arteries:  Normal  coronary origin.  Left dominance.   Left main: The left main is a large caliber vessel with a normal take off from the left coronary cusp that bifurcates to form a left anterior descending artery and a left circumflex artery. There is no plaque or stenosis.   Left anterior descending artery: The LAD is patent without evidence of plaque or stenosis. The LAD gives off 2 patent diagonal branches.   Left circumflex artery: The LCX is dominant and patent with no evidence of plaque or stenosis. The LCX gives off 2 patent obtuse marginal branches.   Right coronary artery: The RCA is non-dominant with normal take off from the right coronary cusp. There is no evidence of plaque or stenosis. The RCA terminates as a PDA and right posterolateral branch without evidence of plaque or stenosis.   Right Atrium: Right atrial size is within normal limits.   Right Ventricle: The right ventricular cavity is within normal limits.   Left Atrium: Left atrial size is normal in size with no left atrial appendage filling defect. Small PFO.   Left Ventricle: The ventricular cavity size is within normal limits.   Pulmonary arteries: Normal in size without proximal filling defect.   Pulmonary veins: Normal pulmonary venous drainage.   Pericardium: Normal thickness without significant effusion or calcium present.   Cardiac valves: The aortic valve is trileaflet without significant calcification. The mitral valve is normal without significant calcification.   Aorta: Normal caliber without significant disease.   Extra-cardiac findings: See attached radiology report for non-cardiac structures.   IMPRESSION: 1. Coronary calcium score of 0.   2. Normal coronary origin with left dominance.   3. Normal coronary arteries.   RECOMMENDATIONS: 1. No evidence of CAD (0%). Consider non-atherosclerotic causes of chest pain.   Eleonore Chiquito, MD     Electronically Signed   By: Eleonore Chiquito M.D.    On: 08/21/2022 10:42 Echo 08/20/2022 IMPRESSIONS   1. Left ventricular ejection fraction, by estimation, is 60 to 65%. The  left ventricle has normal function. The left ventricle has no regional  wall motion abnormalities. Left ventricular diastolic parameters were  normal. The average left ventricular  global longitudinal strain is -24.9 %. The global longitudinal strain is  normal.   2. Right ventricular systolic function is normal. The right ventricular  size is normal.   3. The mitral valve is normal in structure. No evidence of mitral valve  regurgitation. No evidence of mitral stenosis.   4. The aortic valve is tricuspid. Aortic valve regurgitation is not  visualized. No aortic stenosis is present.   5. The inferior vena cava is normal in size with greater than 50%  respiratory variability, suggesting right atrial pressure of 3 mmHg.   Comparison(s): No significant change from prior study.   Conclusion(s)/Recommendation(s): Normal biventricular function without  evidence of hemodynamically significant valvular heart disease.    Patient Profile     41 y.o. female with PMH of asthma, PCOS, history of lap band surgery in 2013 and iron deficiency/thalassemia trait presented for evaluation of intermittent chest pain  Assessment & Plan    Chest pain             -Very atypical presentation of chest pain radiating to the back, accompanied by flushing sensation, blood pressure elevation and palpitation.  Symptom has been going on for a month.  Yesterday's episode lasted longer than 2 hours with negative troponin x 2.  Pregnancy test negative.  A D-dimer 0.74.  CTA was negative for PE.  Patient was seen by Dr. Claiborne Billings on 08/16/2022 who recommended echocardiogram and coronary CT given the atypical nature of her symptoms, both has been scheduled but not done yet.    -Echo obtained on 08/20/2022 was normal. EF 60-65%. Pending coronary CT today, if normal, no additional cardiac workup. May  consider outpatient GI eval, also consider FSH/LH to rule out early onset perimenopause given flushing sensation and palpitation symptoms      For questions or updates, please contact [place] Please consult [email or web address] for contact info under     Signed, Almyra Deforest, [place]  08/21/2022, 8:06 AM    Patient seen and examined, note reviewed with the signed Advanced Practice Provider. I personally reviewed laboratory data, imaging studies and relevant notes. I independently examined the patient and formulated the important aspects of the plan. I have personally discussed the plan with the patient and/or family. Comments or changes to the note/plan are indicated below.  Coronary CTA done today which was normal with no evidence of coronary artery disease.  Echocardiogram also normal.  This is good news from a cardiovascular standpoint. She can be discharged from a CV standpoint.  Berniece Salines DO, MS [facility] Attending Cardiologist [place]  [address] [place], [place] [address] [phone] Website: [email or web address]

## 2022-08-21 NOTE — Progress Notes (Addendum)
PROGRESS NOTE    Karina [name]  DQQ:[id number] DOB: [birth year] DOA: 08/19/2022 PCP: Jonathon Bellows, PA-C    Chief Complaint  Patient presents with   Chest Pain    Brief Narrative:  Karina [name] is a 41 y.o. 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    Assessment & Plan:  Principal Problem:   Chest pain Active Problems:   Asthma   Iron deficiency anemia   Leukocytosis   Dysuria   Transaminitis    Assessment and Plan: * Chest pain Intermittent pressure-like chest discomfort occurring at rest.  Troponin negative x 2, EKG without significant ischemic changes.   -CTA chest negative for PE, aneurysm, dissection.   -Scheduled for outpatient CT coronary and TTE.  If cardiac workup negative would consider upper GI etiology and follow-up with Dr. Collene Mares as an outpatient.   EDP discussed with on-call cardiology who recommended admission for further evaluation. -2D echo obtained with a EF of 60 to 65%, NWMA, no valvular abnormalities.   -High-sensitivity troponin negative. -Recent CTA done normal with no evidence of coronary artery disease. -Patient noted on labs this morning to have a transaminitis with elevated bilirubin level. -Check a MRCP, acute hepatitis panel, fractionate bilirubin. -Continue PPI, GI cocktail as needed. -Patient seen in consultation by cardiology and with normal 2D echo, normal coronary CT angio vision cleared from a cardiovascular standpoint.   -Due to transaminitis, improvement on PPI, consult with GI for further evaluation and management.    Asthma -Stable.   -Albuterol as needed.   Transaminitis -Patient with a transaminitis with elevated alk phosphatase, elevated AST, ALT, elevated bilirubin. -Patient  had presented with chest pain cardiac workup currently negative to date. -Check an acute hepatitis panel, fractionate bilirubin, MRCP for further evaluation and management.   -Consult with GI for further evaluation and management.  Dysuria -Urinalysis unremarkable.   -Place on Pyridium. -Supportive care.   Leukocytosis Persistent nonspecific mild leukocytosis.  No obvious active infection. -Patient with some complaints of suprapubic lower abdominal pain and some dysuria. -Leukocytosis trended down. -Urinalysis done unremarkable.  Iron deficiency anemia Multifactorial anemia due to iron deficiency and thalassemia trait.  Hemoglobin stable at 10.8.         DVT prophylaxis: Lovenox Code Status: Full Family Communication: Updated patient.  No family at bedside. Disposition: Home once workup completed for transaminitis and once cleared by GI.    Status is: Observation The patient remains OBS appropriate and will d/c before 2 midnights.   Consultants:  Cardiology: Dr.Tobb 08/20/2022  Procedures:  CT angiogram chest 08/19/2022 Chest x-ray 08/19/2022 2D echo 08/20/2022 Coronary CT 08/21/2022  Antimicrobials:  None   Subjective: Patient up in bed.  Just returned from cardiac CT.  Denies any chest pain.  No shortness of breath.  No abdominal pain.  Overall feels better today than she did on admission.   Objective: Vitals:   08/20/22 1327 08/20/22 2013 08/21/22 0526 08/21/22 1213  BP: 123/68 117/61 115/61 101/61  Pulse: 72 81 87 72  Resp: _0 (!) 22  Temp: 98.1 F (36.7 C) 98.3 F (36.8 C) 97.9 F (36.6 C) 98.2 F (36.8 C)  TempSrc: Oral Oral Oral   SpO2: 100% 99% 97% 99%  Weight:      Height:  Intake/Output Summary (Last 24 hours) at 08/21/2022 1703 Last data filed at 08/20/2022 2213 Gross per 24 hour  Intake 3 ml  Output --  Net 3 ml   Filed Weights   08/19/22 1956  Weight: (!) 136.9 kg    Examination:  General exam: NAD. Respiratory  system: Lungs clear to auscultation bilaterally.  No wheezes, no crackles, no rhonchi.  Fair air movement.  Speaking in full sentences.   Cardiovascular system: Regular rate and rhythm no murmurs rubs or gallops.  No JVD.  No lower extremity edema.   Gastrointestinal system: Abdomen is obese, soft, nontender, nondistended, positive bowel sounds.  No rebound.  No guarding. Central nervous system: Alert and oriented. No focal neurological deficits. Extremities: Symmetric 5 x 5 power. Skin: No rashes, lesions or ulcers Psychiatry: Judgement and insight appear normal. Mood & affect appropriate.     Data Reviewed:   CBC: Recent Labs  Lab 08/19/22 1429 08/20/22 1142 08/21/22 1109  WBC 13.8* 10.0 9.3  NEUTROABS  --  5.8 5.1  HGB 11.1* 11.0* 10.8*  HCT 33.9* 34.2* 33.4*  MCV 67.8* 69.1* 69.4*  PLT 353 331 672    Basic Metabolic Panel: Recent Labs  Lab 08/19/22 1429 08/20/22 0444 08/21/22 1109  NA 132* 134* 130*  K 3.7 3.5 5.2*  CL 100 106 104  CO2 22 21* 22  GLUCOSE 98 109* 97  BUN _0 CREATININE 0.70 0.74 0.69  CALCIUM 9.2 8.5* 8.4*  MG  --  1.8 2.2    GFR: Estimated Creatinine Clearance: 127.4 mL/min (by C-G formula based on SCr of 0.69 mg/dL).  Liver Function Tests: Recent Labs  Lab 08/21/22 1109  AST 153*  151*  ALT 162*  164*  ALKPHOS 340*  339*  BILITOT 3.7*  3.9*  PROT 8.1  9.0*  ALBUMIN 2.5*  2.8*    CBG: No results for input(s): "GLUCAP" in the last 168 hours.   Recent Results (from the past 240 hour(s))  Resp panel by RT-PCR (RSV, Flu A&B, Covid)     Status: None   Collection Time: 08/19/22  3:57 PM   Specimen: Nasal Swab  Result Value Ref Range Status   SARS Coronavirus 2 by RT PCR NEGATIVE NEGATIVE Final    Comment: (NOTE) SARS-CoV-2 target nucleic acids are NOT DETECTED.  The SARS-CoV-2 RNA is generally detectable in upper respiratory specimens during the acute phase of infection. The lowest concentration of SARS-CoV-2 viral  copies this assay can detect is 138 copies/mL. A negative result does not preclude SARS-Cov-2 infection and should not be used as the sole basis for treatment or other patient management decisions. A negative result may occur with  improper specimen collection/handling, submission of specimen other than nasopharyngeal swab, presence of viral mutation(s) within the areas targeted by this assay, and inadequate number of viral copies(<138 copies/mL). A negative result must be combined with clinical observations, patient history, and epidemiological information. The expected result is Negative.  Fact Sheet for Patients:  EntrepreneurPulse.com.au  Fact Sheet for Healthcare Providers:  IncredibleEmployment.be  This test is no t yet approved or cleared by the [place] FDA and  has been authorized for detection and/or diagnosis of SARS-CoV-2 by FDA under an Emergency Use Authorization (EUA). This EUA will remain  in effect (meaning this test can be used) for the duration of the COVID-19 declaration under Section 564(b)(1) of the Act, 21 U.S.C.section 360bbb-3(b)(1), unless the authorization is terminated  or revoked sooner.  Influenza A by PCR NEGATIVE NEGATIVE Final   Influenza B by PCR NEGATIVE NEGATIVE Final    Comment: (NOTE) The Xpert Xpress SARS-CoV-2/FLU/RSV plus assay is intended as an aid in the diagnosis of influenza from Nasopharyngeal swab specimens and should not be used as a sole basis for treatment. Nasal washings and aspirates are unacceptable for Xpert Xpress SARS-CoV-2/FLU/RSV testing.  Fact Sheet for Patients: EntrepreneurPulse.com.au  Fact Sheet for Healthcare Providers: IncredibleEmployment.be  This test is not yet approved or cleared by the [place] FDA and has been authorized for detection and/or diagnosis of SARS-CoV-2 by FDA under an Emergency Use Authorization (EUA). This  EUA will remain in effect (meaning this test can be used) for the duration of the COVID-19 declaration under Section 564(b)(1) of the Act, 21 U.S.C. section 360bbb-3(b)(1), unless the authorization is terminated or revoked.     Resp Syncytial Virus by PCR NEGATIVE NEGATIVE Final    Comment: (NOTE) Fact Sheet for Patients: EntrepreneurPulse.com.au  Fact Sheet for Healthcare Providers: IncredibleEmployment.be  This test is not yet approved or cleared by the [place] FDA and has been authorized for detection and/or diagnosis of SARS-CoV-2 by FDA under an Emergency Use Authorization (EUA). This EUA will remain in effect (meaning this test can be used) for the duration of the COVID-19 declaration under Section 564(b)(1) of the Act, 21 U.S.C. section 360bbb-3(b)(1), unless the authorization is terminated or revoked.  Performed at [facility], [address], [place], [place] [address]   Urine Culture     Status: Abnormal   Collection Time: 08/20/22 11:39 AM   Specimen: Urine, Clean Catch  Result Value Ref Range Status   Specimen Description   Final    URINE, CLEAN CATCH Performed at [facility], [place] [address] [place], [place] [address]    Special Requests   Final    NONE Performed at [facility], [place] [address] [place], [place] [address]    Culture MULTIPLE SPECIES PRESENT, SUGGEST RECOLLECTION (A)  Final   Report Status 08/21/2022 FINAL  Final         Radiology Studies: CT CORONARY MORPH W/CTA COR W/SCORE W/CA W/CM &/OR WO/CM  Addendum Date: 08/21/2022   ADDENDUM REPORT: 08/21/2022 12:22 EXAM: OVER-READ INTERPRETATION  CT CHEST The following report is an over-read performed by radiologist Dr. [place] Bali [facility] on 08/21/2022. This over-read does not include interpretation of cardiac or coronary anatomy or pathology. The coronary CTA interpretation by  the cardiologist is attached. COMPARISON:  08/19/2022 FINDINGS: Aorta normal caliber. No pericardial effusion. Pulmonary arteries patent. Esophagus unremarkable. Visualized upper abdomen remarkable only for tubing in the LEFT upper quadrant. Lungs clear. No acute osseous findings. IMPRESSION: No acute or significant extracardiac findings. Electronically Signed   By: Lavonia Dana M.D.   On: 08/21/2022 12:22   Result Date: 08/21/2022 CLINICAL DATA:  Chest pain EXAM: Cardiac/Coronary CTA TECHNIQUE: A non-contrast, gated CT scan was obtained with axial slices of 3 mm through the heart for calcium scoring. Calcium scoring was performed using the Agatston method. A 120 kV prospective, gated, contrast cardiac scan was obtained. Gantry rotation speed was 250 msecs and collimation was 0.6 mm. Two sublingual nitroglycerin tablets (0.8 mg) were given. The 3D data set was reconstructed in 5% intervals of the 35-75% of the R-R cycle. Diastolic phases were analyzed on a dedicated workstation using MPR, MIP, and VRT modes. The patient received 95 cc of contrast. FINDINGS: Image quality: Average.  Slab artifact noted.  Noise artifact is: Limited. Coronary Arteries:  Normal coronary origin.  Left dominance. Left main: The left main is a large caliber vessel with a normal take off from the left coronary cusp that bifurcates to form a left anterior descending artery and a left circumflex artery. There is no plaque or stenosis. Left anterior descending artery: The LAD is patent without evidence of plaque or stenosis. The LAD gives off 2 patent diagonal branches. Left circumflex artery: The LCX is dominant and patent with no evidence of plaque or stenosis. The LCX gives off 2 patent obtuse marginal branches. Right coronary artery: The RCA is non-dominant with normal take off from the right coronary cusp. There is no evidence of plaque or stenosis. The RCA terminates as a PDA and right posterolateral branch without evidence of plaque or  stenosis. Right Atrium: Right atrial size is within normal limits. Right Ventricle: The right ventricular cavity is within normal limits. Left Atrium: Left atrial size is normal in size with no left atrial appendage filling defect. Small PFO. Left Ventricle: The ventricular cavity size is within normal limits. Pulmonary arteries: Normal in size without proximal filling defect. Pulmonary veins: Normal pulmonary venous drainage. Pericardium: Normal thickness without significant effusion or calcium present. Cardiac valves: The aortic valve is trileaflet without significant calcification. The mitral valve is normal without significant calcification. Aorta: Normal caliber without significant disease. Extra-cardiac findings: See attached radiology report for non-cardiac structures. IMPRESSION: 1. Coronary calcium score of 0. 2. Normal coronary origin with left dominance. 3. Normal coronary arteries. RECOMMENDATIONS: 1. No evidence of CAD (0%). Consider non-atherosclerotic causes of chest pain. Eleonore Chiquito, MD Electronically Signed: By: Eleonore Chiquito M.D. On: 08/21/2022 10:42   ECHOCARDIOGRAM COMPLETE  Result Date: 08/20/2022    ECHOCARDIOGRAM REPORT   Patient Name:   Karina [name] Date of Exam: 08/20/2022 Medical Rec #:  [id number]              Height:       65.0 in Accession #:    [id number]             Weight:       301.8 lb Date of Birth:  06-28-1981              BSA:          2.356 m Patient Age:    13 years               BP:           123/68 mmHg Patient Gender: F                      HR:           82 bpm. Exam Location:  Inpatient Procedure: 2D Echo, Cardiac Doppler, Color Doppler and Strain Analysis Indications:    Chest Pain R07.9  History:        Patient has prior history of Echocardiogram examinations, most                 recent 05/07/2017. Risk Factors:Sleep Apnea.  Sonographer:    Darlina Sicilian RDCS Referring Phys: [id number] [place]  1. Left ventricular ejection fraction, by  estimation, is 60 to 65%. The left ventricle has normal function. The left ventricle has no regional wall motion abnormalities. Left ventricular diastolic parameters were normal. The average left ventricular global longitudinal strain is -24.9 %. The global longitudinal strain is normal.  2. Right ventricular  systolic function is normal. The right ventricular size is normal.  3. The mitral valve is normal in structure. No evidence of mitral valve regurgitation. No evidence of mitral stenosis.  4. The aortic valve is tricuspid. Aortic valve regurgitation is not visualized. No aortic stenosis is present.  5. The inferior vena cava is normal in size with greater than 50% respiratory variability, suggesting right atrial pressure of 3 mmHg. Comparison(s): No significant change from prior study. Conclusion(s)/Recommendation(s): Normal biventricular function without evidence of hemodynamically significant valvular heart disease. FINDINGS  Left Ventricle: Left ventricular ejection fraction, by estimation, is 60 to 65%. The left ventricle has normal function. The left ventricle has no regional wall motion abnormalities. The average left ventricular global longitudinal strain is -24.9 %. The global longitudinal strain is normal. The left ventricular internal cavity size was normal in size. There is no left ventricular hypertrophy. Left ventricular diastolic parameters were normal. Right Ventricle: The right ventricular size is normal. No increase in right ventricular wall thickness. Right ventricular systolic function is normal. Left Atrium: Left atrial size was normal in size. Right Atrium: Right atrial size was normal in size. Pericardium: There is no evidence of pericardial effusion. Mitral Valve: The mitral valve is normal in structure. No evidence of mitral valve regurgitation. No evidence of mitral valve stenosis. Tricuspid Valve: The tricuspid valve is grossly normal. Tricuspid valve regurgitation is not demonstrated.  No evidence of tricuspid stenosis. Aortic Valve: The aortic valve is tricuspid. Aortic valve regurgitation is not visualized. No aortic stenosis is present. Pulmonic Valve: The pulmonic valve was not well visualized. Pulmonic valve regurgitation is not visualized. No evidence of pulmonic stenosis. Aorta: The aortic root, ascending aorta, aortic arch and descending aorta are all structurally normal, with no evidence of dilitation or obstruction. Venous: The inferior vena cava is normal in size with greater than 50% respiratory variability, suggesting right atrial pressure of 3 mmHg. IAS/Shunts: No atrial level shunt detected by color flow Doppler.  LEFT VENTRICLE PLAX 2D LVIDd:         5.20 cm   Diastology LVIDs:         2.80 cm   LV e' medial:    12.80 cm/s LV PW:         0.70 cm   LV E/e' medial:  6.8 LV IVS:        0.70 cm   LV e' lateral:   14.10 cm/s LVOT diam:     2.20 cm   LV E/e' lateral: 6.2 LV SV:         93 LV SV Index:   39        2D Longitudinal Strain LVOT Area:     3.80 cm  2D Strain GLS (A2C):   -25.7 %                          2D Strain GLS (A3C):   -23.8 %                          2D Strain GLS (A4C):   -25.3 %                          2D Strain GLS Avg:     -24.9 % RIGHT VENTRICLE RV S prime:     14.80 cm/s TAPSE (M-mode): 2.8 cm LEFT ATRIUM  Index        RIGHT ATRIUM           Index LA diam:        4.20 cm 1.78 cm/m   RA Area:     14.20 cm LA Vol (A2C):   20.5 ml 8.70 ml/m   RA Volume:   33.40 ml  14.18 ml/m LA Vol (A4C):   38.1 ml 16.17 ml/m LA Biplane Vol: 28.1 ml 11.93 ml/m  AORTIC VALVE LVOT Vmax:   123.00 cm/s LVOT Vmean:  78.900 cm/s LVOT VTI:    0.244 m  AORTA Ao Root diam: 2.70 cm Ao Asc diam:  2.90 cm MITRAL VALVE MV Area (PHT): 4.60 cm    SHUNTS MV Decel Time: 165 msec    Systemic VTI:  0.24 m MV E velocity: 87.00 cm/s  Systemic Diam: 2.20 cm MV A velocity: 66.00 cm/s MV E/A ratio:  1.32 Buford Dresser MD Electronically signed by Buford Dresser MD  Signature Date/Time: 08/20/2022/4:56:52 PM    Final    CT Angio Chest PE W and/or Wo Contrast  Result Date: 08/19/2022 CLINICAL DATA:  Pulmonary embolism (PE) suspected, low to intermediate prob, positive D-dimer EXAM: CT ANGIOGRAPHY CHEST WITH CONTRAST TECHNIQUE: Multidetector CT imaging of the chest was performed using the standard protocol during bolus administration of intravenous contrast. Multiplanar CT image reconstructions and MIPs were obtained to evaluate the vascular anatomy. RADIATION DOSE REDUCTION: This exam was performed according to the departmental dose-optimization program which includes automated exposure control, adjustment of the mA and/or kV according to patient size and/or use of iterative reconstruction technique. CONTRAST:  72 mL Omnipaque 350non-ionic IV contrast. COMPARISON:  07/06/2022 FINDINGS: Cardiovascular: Satisfactory opacification of the pulmonary arteries to the segmental level. No evidence of pulmonary embolism. Normal heart size. No pericardial effusion. Thoracic aorta unremarkable. Mediastinum/Nodes: No enlarged mediastinal, hilar, or axillary lymph nodes. Thyroid gland, trachea, and esophagus demonstrate no significant findings. Lungs/Pleura: Lungs are clear. No pleural effusion or pneumothorax. Upper Abdomen: No acute abnormality.  Lap band noted. Musculoskeletal: No chest wall abnormality. No acute or significant osseous findings. There are thoracic degenerative changes. Review of the MIP images confirms the above findings. IMPRESSION: No evidence of PE, aneurysm or dissection. Electronically Signed   By: Sammie Bench M.D.   On: 08/19/2022 17:54        Scheduled Meds:  enoxaparin (LOVENOX) injection  70 mg Subcutaneous Q24H   pantoprazole  40 mg Oral Daily   phenazopyridine  200 mg Oral TID WC   sodium chloride flush  3 mL Intravenous Q12H   Continuous Infusions:   LOS: 0 days    Time spent: 40 minutes    Irine Seal, MD [facility]   To contact the attending provider between 7A-7P or the covering provider during after hours 7P-7A, please log into the web site [email or web address] and access using universal [place] password for that web site. If you do not have the password, please call the hospital operator.  08/21/2022, 5:03 PM

## 2022-08-21 NOTE — Assessment & Plan Note (Addendum)
-  Patient with a transaminitis with elevated alk phosphatase, elevated AST, ALT, elevated bilirubin. -Patient had presented with chest pain cardiac workup currently negative to date. -Check an acute hepatitis panel, fractionate bilirubin, MRCP for further evaluation and management.   -Consult with GI for further evaluation and management.

## 2022-08-21 NOTE — Progress Notes (Signed)
  Transition of Care [facility]) Screening Note   Patient Details  Name: Karina [name] Date of Birth: 1981-06-22   Transition of Care [facility]) CM/SW Contact:    Lanier Clam, RN Phone Number: 08/21/2022, 9:51 AM    Transition of Care Department [facility]) has reviewed patient and no TOC needs have been identified at this time. We will continue to monitor patient advancement through interdisciplinary progression rounds. If new patient transition needs arise, please place a TOC consult.

## 2022-08-22 ENCOUNTER — Encounter (HOSPITAL_COMMUNITY)
Admission: EM | Disposition: A | Discharge status: Home / Self Care | Payer: Self-pay | Source: Home / Self Care | Attending: Internal Medicine

## 2022-08-22 ENCOUNTER — Inpatient Hospital Stay (HOSPITAL_COMMUNITY): Payer: 59 | Admitting: Certified Registered Nurse Anesthetist

## 2022-08-22 ENCOUNTER — Inpatient Hospital Stay (HOSPITAL_COMMUNITY): Payer: 59

## 2022-08-22 ENCOUNTER — Encounter (HOSPITAL_COMMUNITY): Payer: Self-pay | Admitting: Internal Medicine

## 2022-08-22 DIAGNOSIS — J45909 Unspecified asthma, uncomplicated: Secondary | ICD-10-CM

## 2022-08-22 DIAGNOSIS — K8050 Calculus of bile duct without cholangitis or cholecystitis without obstruction: Secondary | ICD-10-CM

## 2022-08-22 DIAGNOSIS — F418 Other specified anxiety disorders: Secondary | ICD-10-CM

## 2022-08-22 DIAGNOSIS — Z87891 Personal history of nicotine dependence: Secondary | ICD-10-CM

## 2022-08-22 DIAGNOSIS — R079 Chest pain, unspecified: Secondary | ICD-10-CM | POA: Diagnosis not present

## 2022-08-22 HISTORY — PX: REMOVAL OF STONES: SHX5545

## 2022-08-22 HISTORY — PX: ERCP: SHX5425

## 2022-08-22 HISTORY — PX: SPHINCTEROTOMY: SHX5544

## 2022-08-22 LAB — BASIC METABOLIC PANEL
Anion gap: 5 (ref 5–15)
BUN: 11 mg/dL (ref 6–20)
CO2: 24 mmol/L (ref 22–32)
Calcium: 8.3 mg/dL — ABNORMAL LOW (ref 8.9–10.3)
Chloride: 106 mmol/L (ref 98–111)
Creatinine, Ser: 0.87 mg/dL (ref 0.44–1.00)
GFR, Estimated: >60 mL/min (ref >60)
Glucose, Bld: 83 mg/dL (ref 70–99)
Potassium: 3.8 mmol/L (ref 3.5–5.1)
Sodium: 135 mmol/L (ref 135–145)

## 2022-08-22 LAB — CBC WITH DIFFERENTIAL/PLATELET
Abs Immature Granulocytes: 0.04 10*3/uL (ref 0.00–0.07)
Basophils Absolute: 0.1 10*3/uL (ref 0.0–0.1)
Basophils Relative: 1 %
Eosinophils Absolute: 0.6 10*3/uL — ABNORMAL HIGH (ref 0.0–0.5)
Eosinophils Relative: 5 %
HCT: 30.2 % — ABNORMAL LOW (ref 36.0–46.0)
Hemoglobin: 9.8 g/dL — ABNORMAL LOW (ref 12.0–15.0)
Immature Granulocytes: 0 %
Lymphocytes Relative: 33 %
Lymphs Abs: 3.4 10*3/uL (ref 0.7–4.0)
MCH: 22.3 pg — ABNORMAL LOW (ref 26.0–34.0)
MCHC: 32.5 g/dL (ref 30.0–36.0)
MCV: 68.8 fL — ABNORMAL LOW (ref 80.0–100.0)
Monocytes Absolute: 0.9 10*3/uL (ref 0.1–1.0)
Monocytes Relative: 8 %
Neutro Abs: 5.5 10*3/uL (ref 1.7–7.7)
Neutrophils Relative %: 53 %
Platelets: 312 10*3/uL (ref 150–400)
RBC: 4.39 MIL/uL (ref 3.87–5.11)
RDW: 16.4 % — ABNORMAL HIGH (ref 11.5–15.5)
WBC: 10.4 10*3/uL (ref 4.0–10.5)
nRBC: 0 % (ref 0.0–0.2)

## 2022-08-22 LAB — MAGNESIUM: Magnesium: 2.2 mg/dL (ref 1.7–2.4)

## 2022-08-22 LAB — HEPATITIS PANEL, ACUTE
HCV Ab: NONREACTIVE
Hep A IgM: NONREACTIVE
Hep B C IgM: NONREACTIVE
Hepatitis B Surface Ag: NONREACTIVE

## 2022-08-22 LAB — HEPATIC FUNCTION PANEL
ALT: 135 U/L — ABNORMAL HIGH (ref 0–44)
AST: 107 U/L — ABNORMAL HIGH (ref 15–41)
Albumin: 2.5 g/dL — ABNORMAL LOW (ref 3.5–5.0)
Alkaline Phosphatase: 285 U/L — ABNORMAL HIGH (ref 38–126)
Bilirubin, Direct: 0.8 mg/dL — ABNORMAL HIGH (ref 0.0–0.2)
Indirect Bilirubin: 1 mg/dL — ABNORMAL HIGH (ref 0.3–0.9)
Total Bilirubin: 1.8 mg/dL — ABNORMAL HIGH (ref 0.3–1.2)
Total Protein: 7.8 g/dL (ref 6.5–8.1)

## 2022-08-22 SURGERY — ERCP, WITH INTERVENTION IF INDICATED
Anesthesia: General

## 2022-08-22 MED ORDER — FENTANYL CITRATE (PF) 100 MCG/2ML IJ SOLN
INTRAMUSCULAR | Status: DC | PRN
  Administered 2022-08-22 (×2): 50 ug via INTRAVENOUS

## 2022-08-22 MED ORDER — DEXAMETHASONE SODIUM PHOSPHATE 10 MG/ML IJ SOLN
INTRAMUSCULAR | Status: DC | PRN
  Administered 2022-08-22: 4 mg via INTRAVENOUS

## 2022-08-22 MED ORDER — SUGAMMADEX SODIUM 500 MG/5ML IV SOLN
INTRAVENOUS | Status: DC | PRN
  Administered 2022-08-22: 273.8 mg via INTRAVENOUS

## 2022-08-22 MED ORDER — LIDOCAINE 2% (20 MG/ML) 5 ML SYRINGE
INTRAMUSCULAR | Status: DC | PRN
  Administered 2022-08-22: 80 mg via INTRAVENOUS

## 2022-08-22 MED ORDER — GLUCAGON HCL RDNA (DIAGNOSTIC) 1 MG IJ SOLR
INTRAMUSCULAR | Status: AC
  Filled 2022-08-22: qty 1

## 2022-08-22 MED ORDER — SODIUM CHLORIDE 0.9 % IV SOLN
INTRAVENOUS | Status: DC | PRN
  Administered 2022-08-22: 25 mL

## 2022-08-22 MED ORDER — PROPOFOL 10 MG/ML IV BOLUS
INTRAVENOUS | Status: AC
  Filled 2022-08-22: qty 20

## 2022-08-22 MED ORDER — FENTANYL CITRATE (PF) 100 MCG/2ML IJ SOLN
INTRAMUSCULAR | Status: AC
  Filled 2022-08-22: qty 2

## 2022-08-22 MED ORDER — ONDANSETRON HCL 4 MG/2ML IJ SOLN
INTRAMUSCULAR | Status: DC | PRN
  Administered 2022-08-22: 4 mg via INTRAVENOUS

## 2022-08-22 MED ORDER — INDOMETHACIN 50 MG RE SUPP
RECTAL | Status: AC
  Filled 2022-08-22: qty 2

## 2022-08-22 MED ORDER — DICLOFENAC SUPPOSITORY 100 MG
RECTAL | Status: AC
  Filled 2022-08-22: qty 1

## 2022-08-22 MED ORDER — DICLOFENAC SUPPOSITORY 100 MG
RECTAL | Status: DC | PRN
  Administered 2022-08-22: 100 mg via RECTAL

## 2022-08-22 MED ORDER — MIDAZOLAM HCL 2 MG/2ML IJ SOLN
INTRAMUSCULAR | Status: AC
  Filled 2022-08-22: qty 2

## 2022-08-22 MED ORDER — ROCURONIUM BROMIDE 10 MG/ML (PF) SYRINGE
PREFILLED_SYRINGE | INTRAVENOUS | Status: DC | PRN
  Administered 2022-08-22: 60 mg via INTRAVENOUS

## 2022-08-22 MED ORDER — CIPROFLOXACIN IN D5W 400 MG/200ML IV SOLN
INTRAVENOUS | Status: AC
  Filled 2022-08-22: qty 200

## 2022-08-22 MED ORDER — MIDAZOLAM HCL 5 MG/5ML IJ SOLN
INTRAMUSCULAR | Status: DC | PRN
  Administered 2022-08-22: 2 mg via INTRAVENOUS

## 2022-08-22 MED ORDER — PROPOFOL 10 MG/ML IV BOLUS
INTRAVENOUS | Status: DC | PRN
  Administered 2022-08-22: 200 mg via INTRAVENOUS

## 2022-08-22 MED ORDER — SODIUM CHLORIDE 0.9 % IV SOLN: INTRAVENOUS | Status: DC

## 2022-08-22 MED ORDER — PHENYLEPHRINE 80 MCG/ML (10ML) SYRINGE FOR IV PUSH (FOR BLOOD PRESSURE SUPPORT)
PREFILLED_SYRINGE | INTRAVENOUS | Status: DC | PRN
  Administered 2022-08-22: 80 ug via INTRAVENOUS

## 2022-08-22 MED ORDER — LACTATED RINGERS IV SOLN: INTRAVENOUS | Status: DC

## 2022-08-22 MED ORDER — CIPROFLOXACIN IN D5W 400 MG/200ML IV SOLN
INTRAVENOUS | Status: DC | PRN
  Administered 2022-08-22: 400 mg via INTRAVENOUS

## 2022-08-22 NOTE — Consult Note (Signed)
Reason for Consult:  Abnormal liver enzymes - obstructive pattern, chest pain, and epigastric pain Referring Physician: [facility] HPI: This is a 41 year old female with a PMH of GERD, asthma, chronic bronchitis, s/p lap band for morbid obesity, and s/p lap chole for chronic cholecystitis on 01/11/2012 who presents to the hospital with intermittent chest pain.  The patient reports having chest pain on and off for the past month.  It was not necessarily correlated with any PO intake.  The pain was located in the lower chest and epigastric region with radiation to her left back.  Her pain typically lasted from a few minutes upwards to a couple of hours.  She presented to the hospital for her chest pain when it lasted longer, about 2.5 hours, and there was radiation to her left shoulder and down her left arm.  A cardiac work up was performed with Cardiology and she was noted to have a normal EF and a CAC score of 0.  No further evaluation was recommended.  Her blood work starting yesterday showed an elevation in her liver enzymes consistent with an obstructive pattern.  Review of her prior blood work did show a persistent elevation of her AP and a minimal AST elevation.  In retrospect the patient does report having some chronic intermittent right shoulder pain.  The pain is reminiscent of her chronic cholecystitis pain.  The MRCP performed yesterday is suggestive of a 3 mm stone in the distal CBD.  There is mild intrahepatic biliary ductal dilation and the proximal CBD measures around 1 cm.  Her liver enzymes improved today and she does not complain about the chest pain.  Past Medical History:  Diagnosis Date   Anemia    takes iron daily   Anxiety    no meds   Asthma    albuterol as needed   Chronic bronchitis    bronchitis x 1 event in her twenties   Complication of anesthesia    decreased respirations with lap band sx   Dysrhythmia     " arrhythmia during endoscopy  procedure "    Edema extremities    hx of edema in lower ext.used to take lasix - none since twenties   Gallbladder disease    GERD (gastroesophageal reflux disease)    nexium    Hemoglobin C trait ([place])    History of chicken pox    HSV (herpes simplex virus) anogenital infection    Neuropathy    feet   Pre-diabetes    Pregnant    svd 06/19/17   Sleep apnea    Does not use cpap nightly   SVD (spontaneous vaginal delivery)    x 2   Wears glasses     Past Surgical History:  Procedure Laterality Date   BIOPSY  07/14/2020   Procedure: BIOPSY;  Surgeon: Juanita Craver, MD;  Location: [facility];  Service: Endoscopy;;   CARPAL TUNNEL RELEASE Bilateral 2008, 2010   CHOLECYSTECTOMY  01/11/2012   Procedure: LAPAROSCOPIC CHOLECYSTECTOMY WITH INTRAOPERATIVE CHOLANGIOGRAM;  Surgeon: Gwenyth Ober, MD;  Location: [facility];  Service: General;  Laterality: N/A;   COLONOSCOPY     COLONOSCOPY WITH PROPOFOL N/A 07/14/2020   Procedure: COLONOSCOPY WITH PROPOFOL;  Surgeon: Juanita Craver, MD;  Location: [facility];  Service: Endoscopy;  Laterality: N/A;   DILATION AND EVACUATION N/A 03/24/2020   Procedure: DILATATION AND EVACUATION;  Surgeon: Waymon Amato, MD;  Location: [facility];  Service: Gynecology;  Laterality: N/A;   LAPAROSCOPIC BILATERAL SALPINGECTOMY Right 08/14/2017   Procedure: LAPAROSCOPIC RIGHT  SALPINGECTOMY;  Surgeon: Delsa Bern, MD;  Location: [facility];  Service: Gynecology;  Laterality: Right;   LAPAROSCOPIC GASTRIC BANDING     UPPER GI ENDOSCOPY     WISDOM TOOTH EXTRACTION      Family History  Problem Relation Age of Onset   Heart attack Mother    Stroke Mother    Hypertension Mother    Anesthesia problems Mother    Hypertension Father    Diabetes Sister    Asthma Sister    Hypertension Brother    Asthma Maternal Grandmother    Stroke Maternal Grandfather    Liver cancer Paternal Grandfather    Prostate cancer Paternal Grandfather    Ovarian cancer Maternal Aunt    Cervical  cancer Maternal Aunt        cervial and thyroid   Prostate cancer Maternal Uncle    Liver cancer Paternal Uncle    Heart disease Other    Hyperlipidemia Other    Hypertension Other    Stroke Other    Kidney disease Other    Thyroid disease Other    Hypotension Neg Hx    Malignant hyperthermia Neg Hx    Pseudochol deficiency Neg Hx     Social History:  reports that she quit smoking about 26 years ago. Her smoking use included cigars and cigarettes. She has a 0.25 pack-year smoking history. She has never used smokeless tobacco. She reports that she does not currently use alcohol. She reports that she does not use drugs.  Allergies: No Known Allergies  Medications: Scheduled:  pantoprazole  40 mg Oral Daily   phenazopyridine  200 mg Oral TID WC   sodium chloride flush  3 mL Intravenous Q12H   Continuous:  sodium chloride 125 mL/hr at 08/22/22 0535    Results for orders placed or performed during the hospital encounter of 08/19/22 (from the past 24 hour(s))  CBC with Differential/Platelet     Status: Abnormal   Collection Time: 08/21/22 11:09 AM  Result Value Ref Range   WBC 9.3 4.0 - 10.5 K/uL   RBC 4.81 3.87 - 5.11 MIL/uL   Hemoglobin 10.8 (L) 12.0 - 15.0 g/dL   HCT 33.4 (L) 36.0 - 46.0 %   MCV 69.4 (L) 80.0 - 100.0 fL   MCH 22.5 (L) 26.0 - 34.0 pg   MCHC 32.3 30.0 - 36.0 g/dL   RDW 16.8 (H) 11.5 - 15.5 %   Platelets 330 150 - 400 K/uL   nRBC 0.0 0.0 - 0.2 %   Neutrophils Relative % 54 %   Neutro Abs 5.1 1.7 - 7.7 K/uL   Lymphocytes Relative 32 %   Lymphs Abs 2.9 0.7 - 4.0 K/uL   Monocytes Relative 7 %   Monocytes Absolute 0.6 0.1 - 1.0 K/uL   Eosinophils Relative 6 %   Eosinophils Absolute 0.5 0.0 - 0.5 K/uL   Basophils Relative 1 %   Basophils Absolute 0.1 0.0 - 0.1 K/uL   Immature Granulocytes 0 %   Abs Immature Granulocytes 0.03 0.00 - 0.07 K/uL  Comprehensive metabolic panel     Status: Abnormal   Collection Time: 08/21/22 11:09 AM  Result Value Ref Range    Sodium 130 (L) 135 - 145 mmol/L   Potassium 5.2 (H) 3.5 - 5.1 mmol/L   Chloride 104 98 - 111 mmol/L   CO2 22 22 - 32 mmol/L   Glucose, Bld 97  70 - 99 mg/dL   BUN 10 6 - 20 mg/dL   Creatinine, Ser 0.69 0.44 - 1.00 mg/dL   Calcium 8.4 (L) 8.9 - 10.3 mg/dL   Total Protein 9.0 (H) 6.5 - 8.1 g/dL   Albumin 2.8 (L) 3.5 - 5.0 g/dL   AST 151 (H) 15 - 41 U/L   ALT 164 (H) 0 - 44 U/L   Alkaline Phosphatase 339 (H) 38 - 126 U/L   Total Bilirubin 3.9 (H) 0.3 - 1.2 mg/dL   GFR, Estimated >60 >60 mL/min   Anion gap 4 (L) 5 - 15  Magnesium     Status: None   Collection Time: 08/21/22 11:09 AM  Result Value Ref Range   Magnesium 2.2 1.7 - 2.4 mg/dL  Hepatitis panel, acute     Status: None   Collection Time: 08/21/22 11:09 AM  Result Value Ref Range   Hepatitis B Surface Ag NON REACTIVE NON REACTIVE   HCV Ab NON REACTIVE NON REACTIVE   Hep A IgM NON REACTIVE NON REACTIVE   Hep B C IgM NON REACTIVE NON REACTIVE  Hepatic function panel     Status: Abnormal   Collection Time: 08/21/22 11:09 AM  Result Value Ref Range   Total Protein 8.1 6.5 - 8.1 g/dL   Albumin 2.5 (L) 3.5 - 5.0 g/dL   AST 153 (H) 15 - 41 U/L   ALT 162 (H) 0 - 44 U/L   Alkaline Phosphatase 340 (H) 38 - 126 U/L   Total Bilirubin 3.7 (H) 0.3 - 1.2 mg/dL   Bilirubin, Direct 1.6 (H) 0.0 - 0.2 mg/dL   Indirect Bilirubin 2.1 (H) 0.3 - 0.9 mg/dL  CBC with Differential/Platelet     Status: Abnormal   Collection Time: 08/22/22  4:35 AM  Result Value Ref Range   WBC 10.4 4.0 - 10.5 K/uL   RBC 4.39 3.87 - 5.11 MIL/uL   Hemoglobin 9.8 (L) 12.0 - 15.0 g/dL   HCT 30.2 (L) 36.0 - 46.0 %   MCV 68.8 (L) 80.0 - 100.0 fL   MCH 22.3 (L) 26.0 - 34.0 pg   MCHC 32.5 30.0 - 36.0 g/dL   RDW 16.4 (H) 11.5 - 15.5 %   Platelets 312 150 - 400 K/uL   nRBC 0.0 0.0 - 0.2 %   Neutrophils Relative % 53 %   Neutro Abs 5.5 1.7 - 7.7 K/uL   Lymphocytes Relative 33 %   Lymphs Abs 3.4 0.7 - 4.0 K/uL   Monocytes Relative 8 %   Monocytes Absolute 0.9 0.1  - 1.0 K/uL   Eosinophils Relative 5 %   Eosinophils Absolute 0.6 (H) 0.0 - 0.5 K/uL   Basophils Relative 1 %   Basophils Absolute 0.1 0.0 - 0.1 K/uL   Immature Granulocytes 0 %   Abs Immature Granulocytes 0.04 0.00 - 0.07 K/uL  Magnesium     Status: None   Collection Time: 08/22/22  4:35 AM  Result Value Ref Range   Magnesium 2.2 1.7 - 2.4 mg/dL  Basic metabolic panel     Status: Abnormal   Collection Time: 08/22/22  4:35 AM  Result Value Ref Range   Sodium 135 135 - 145 mmol/L   Potassium 3.8 3.5 - 5.1 mmol/L   Chloride 106 98 - 111 mmol/L   CO2 24 22 - 32 mmol/L   Glucose, Bld 83 70 - 99 mg/dL   BUN 11 6 - 20 mg/dL   Creatinine, Ser 0.87 0.44 - 1.00 mg/dL  Calcium 8.3 (L) 8.9 - 10.3 mg/dL   GFR, Estimated >60 >60 mL/min   Anion gap 5 5 - 15  Hepatic function panel     Status: Abnormal   Collection Time: 08/22/22  4:35 AM  Result Value Ref Range   Total Protein 7.8 6.5 - 8.1 g/dL   Albumin 2.5 (L) 3.5 - 5.0 g/dL   AST 107 (H) 15 - 41 U/L   ALT 135 (H) 0 - 44 U/L   Alkaline Phosphatase 285 (H) 38 - 126 U/L   Total Bilirubin 1.8 (H) 0.3 - 1.2 mg/dL   Bilirubin, Direct 0.8 (H) 0.0 - 0.2 mg/dL   Indirect Bilirubin 1.0 (H) 0.3 - 0.9 mg/dL     MR ABDOMEN MRCP W WO CONTAST  Result Date: 08/22/2022 CLINICAL DATA:  Biliary obstruction suspected. EXAM: MRI ABDOMEN WITHOUT AND WITH CONTRAST (INCLUDING MRCP) TECHNIQUE: Multiplanar multisequence MR imaging of the abdomen was performed both before and after the administration of intravenous contrast. Heavily T2-weighted images of the biliary and pancreatic ducts were obtained, and three-dimensional MRCP images were rendered by post processing. CONTRAST:  9mL GADAVIST GADOBUTROL 1 MMOL/ML IV SOLN COMPARISON:  CT 07/06/2022 FINDINGS: Portions of exam, especially the MRCP dedicated images, are degraded by patient body habitus and less so motion. Lower chest: Normal heart size without pericardial or pleural effusion. Hepatobiliary: Mild  hepatic steatosis. Suspect mild cirrhosis, as evidenced by caudate lobe enlargement and medial segment left liver lobe atrophy. A too small to characterize observation within segment 7 of 1.0 cm on 18/16 demonstrates arterial phase hypoenhancement and relative Iso enhancement on later post-contrast imaging. Cholecystectomy. There is mild intrahepatic biliary duct dilatation, with an index left hepatic duct measuring 7 mm on 15/4. This is new since 05/10/2016. Mild enhancement is suspected surrounding intrahepatic ducts, most apparent within the right hepatic lobe including on 34/20 and 30/24. The common duct is minimally dilated at 11 mm in the porta hepatis on 16/5. Tapers gradually in the region of the pancreatic head. This is also new since 2017. There is an equivocal 3 mm filling defect within the distal common duct only identified on 10/12. Pancreas:  Normal, without mass or ductal dilatation. Spleen:  Mild splenomegaly, 13.1 cm. Adrenals/Urinary Tract: Normal adrenal glands. Normal kidneys, without hydronephrosis. Stomach/Bowel: Lap band.  Normal abdominal bowel loops. Vascular/Lymphatic: Normal caliber of the aorta and branch vessels. Patent portal and splenic veins without evidence of portal venous hypertension. Prominent porta hepatis nodes are likely related to the suspected cirrhosis. Other:  No ascites. Musculoskeletal: No acute osseous abnormality. 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 IMPRESSION: 1. Patient body habitus and less so motion mildly degraded exam. 2. Cholecystectomy with intrahepatic and less so extrahepatic biliary duct dilatation, new since 2017. Equivocal 3 mm distal common duct stone. Suggestion of mild enhancement surrounding intrahepatic ducts. Consider ERCP to evaluate for subtle choledocholithiasis and/or infectious versus inflammatory cholangitis. 3. Mild hepatic steatosis.  Suspicion of mild cirrhosis. 4. Too small to characterize segment 7 liver lesion does not  have suspicious characteristics. If the patient is eventually diagnosed with cirrhosis, this would be characterized as LR 3 and warrant follow-up MRI at 6 months. 5. Mild splenomegaly Electronically Signed   By: Abigail Miyamoto M.D.   On: 08/22/2022 08:09   MR 3D Recon At Scanner  Result Date: 08/22/2022 CLINICAL DATA:  Biliary obstruction suspected. EXAM: MRI ABDOMEN WITHOUT AND WITH CONTRAST (INCLUDING MRCP) TECHNIQUE: Multiplanar multisequence MR imaging of the abdomen was performed both before and  after the administration of intravenous contrast. Heavily T2-weighted images of the biliary and pancreatic ducts were obtained, and three-dimensional MRCP images were rendered by post processing. CONTRAST:  88mL GADAVIST GADOBUTROL 1 MMOL/ML IV SOLN COMPARISON:  CT 07/06/2022 FINDINGS: Portions of exam, especially the MRCP dedicated images, are degraded by patient body habitus and less so motion. Lower chest: Normal heart size without pericardial or pleural effusion. Hepatobiliary: Mild hepatic steatosis. Suspect mild cirrhosis, as evidenced by caudate lobe enlargement and medial segment left liver lobe atrophy. A too small to characterize observation within segment 7 of 1.0 cm on 18/16 demonstrates arterial phase hypoenhancement and relative Iso enhancement on later post-contrast imaging. Cholecystectomy. There is mild intrahepatic biliary duct dilatation, with an index left hepatic duct measuring 7 mm on 15/4. This is new since 05/10/2016. Mild enhancement is suspected surrounding intrahepatic ducts, most apparent within the right hepatic lobe including on 34/20 and 30/24. The common duct is minimally dilated at 11 mm in the porta hepatis on 16/5. Tapers gradually in the region of the pancreatic head. This is also new since 2017. There is an equivocal 3 mm filling defect within the distal common duct only identified on 10/12. Pancreas:  Normal, without mass or ductal dilatation. Spleen:  Mild splenomegaly, 13.1 cm.  Adrenals/Urinary Tract: Normal adrenal glands. Normal kidneys, without hydronephrosis. Stomach/Bowel: Lap band.  Normal abdominal bowel loops. Vascular/Lymphatic: Normal caliber of the aorta and branch vessels. Patent portal and splenic veins without evidence of portal venous hypertension. Prominent porta hepatis nodes are likely related to the suspected cirrhosis. Other:  No ascites. Musculoskeletal: No acute osseous abnormality. 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 IMPRESSION: 1. Patient body habitus and less so motion mildly degraded exam. 2. Cholecystectomy with intrahepatic and less so extrahepatic biliary duct dilatation, new since 2017. Equivocal 3 mm distal common duct stone. Suggestion of mild enhancement surrounding intrahepatic ducts. Consider ERCP to evaluate for subtle choledocholithiasis and/or infectious versus inflammatory cholangitis. 3. Mild hepatic steatosis.  Suspicion of mild cirrhosis. 4. Too small to characterize segment 7 liver lesion does not have suspicious characteristics. If the patient is eventually diagnosed with cirrhosis, this would be characterized as LR 3 and warrant follow-up MRI at 6 months. 5. Mild splenomegaly Electronically Signed   By: Abigail Miyamoto M.D.   On: 08/22/2022 08:09   CT CORONARY MORPH W/CTA COR W/SCORE W/CA W/CM &/OR WO/CM  Addendum Date: 08/21/2022   ADDENDUM REPORT: 08/21/2022 12:22 EXAM: OVER-READ INTERPRETATION  CT CHEST The following report is an over-read performed by radiologist Dr. [facility] on 08/21/2022. This over-read does not include interpretation of cardiac or coronary anatomy or pathology. The coronary CTA interpretation by the cardiologist is attached. COMPARISON:  08/19/2022 FINDINGS: Aorta normal caliber. No pericardial effusion. Pulmonary arteries patent. Esophagus unremarkable. Visualized upper abdomen remarkable only for tubing in the LEFT upper quadrant. Lungs clear. No acute osseous findings.  IMPRESSION: No acute or significant extracardiac findings. Electronically Signed   By: Lavonia Dana M.D.   On: 08/21/2022 12:22   Result Date: 08/21/2022 CLINICAL DATA:  Chest pain EXAM: Cardiac/Coronary CTA TECHNIQUE: A non-contrast, gated CT scan was obtained with axial slices of 3 mm through the heart for calcium scoring. Calcium scoring was performed using the Agatston method. A 120 kV prospective, gated, contrast cardiac scan was obtained. Gantry rotation speed was 250 msecs and collimation was 0.6 mm. Two sublingual nitroglycerin tablets (0.8 mg) were given. The 3D data set was reconstructed in 5% intervals of the  35-75% of the R-R cycle. Diastolic phases were analyzed on a dedicated workstation using MPR, MIP, and VRT modes. The patient received 95 cc of contrast. FINDINGS: Image quality: Average.  Slab artifact noted. Noise artifact is: Limited. Coronary Arteries:  Normal coronary origin.  Left dominance. Left main: The left main is a large caliber vessel with a normal take off from the left coronary cusp that bifurcates to form a left anterior descending artery and a left circumflex artery. There is no plaque or stenosis. Left anterior descending artery: The LAD is patent without evidence of plaque or stenosis. The LAD gives off 2 patent diagonal branches. Left circumflex artery: The LCX is dominant and patent with no evidence of plaque or stenosis. The LCX gives off 2 patent obtuse marginal branches. Right coronary artery: The RCA is non-dominant with normal take off from the right coronary cusp. There is no evidence of plaque or stenosis. The RCA terminates as a PDA and right posterolateral branch without evidence of plaque or stenosis. Right Atrium: Right atrial size is within normal limits. Right Ventricle: The right ventricular cavity is within normal limits. Left Atrium: Left atrial size is normal in size with no left atrial appendage filling defect. Small PFO. Left Ventricle: The ventricular cavity  size is within normal limits. Pulmonary arteries: Normal in size without proximal filling defect. Pulmonary veins: Normal pulmonary venous drainage. Pericardium: Normal thickness without significant effusion or calcium present. Cardiac valves: The aortic valve is trileaflet without significant calcification. The mitral valve is normal without significant calcification. Aorta: Normal caliber without significant disease. Extra-cardiac findings: See attached radiology report for non-cardiac structures. IMPRESSION: 1. Coronary calcium score of 0. 2. Normal coronary origin with left dominance. 3. Normal coronary arteries. RECOMMENDATIONS: 1. No evidence of CAD (0%). Consider non-atherosclerotic causes of chest pain. Eleonore Chiquito, MD Electronically Signed: By: Eleonore Chiquito M.D. On: 08/21/2022 10:42   ECHOCARDIOGRAM COMPLETE  Result Date: 08/20/2022    ECHOCARDIOGRAM REPORT   Patient Name:   OLGA LUZ PAULA DE CRUZ Date of Exam: 08/20/2022 Medical Rec #:  [id number]              Height:       65.0 in Accession #:    [id number]             Weight:       301.8 lb Date of Birth:  1980/12/26              BSA:          2.356 m Patient Age:    41 years               BP:           123/68 mmHg Patient Gender: F                      HR:           82 bpm. Exam Location:  Inpatient Procedure: 2D Echo, Cardiac Doppler, Color Doppler and Strain Analysis Indications:    Chest Pain R07.9  History:        Patient has prior history of Echocardiogram examinations, most                 recent 05/07/2017. Risk Factors:Sleep Apnea.  Sonographer:    Darlina Sicilian RDCS Referring Phys: [id number] [place]  1. Left ventricular ejection fraction, by estimation, is 60 to 65%. The left ventricle has normal function. The left  ventricle has no regional wall motion abnormalities. Left ventricular diastolic parameters were normal. The average left ventricular global longitudinal strain is -24.9 %. The global longitudinal strain is  normal.  2. Right ventricular systolic function is normal. The right ventricular size is normal.  3. The mitral valve is normal in structure. No evidence of mitral valve regurgitation. No evidence of mitral stenosis.  4. The aortic valve is tricuspid. Aortic valve regurgitation is not visualized. No aortic stenosis is present.  5. The inferior vena cava is normal in size with greater than 50% respiratory variability, suggesting right atrial pressure of 3 mmHg. Comparison(s): No significant change from prior study. Conclusion(s)/Recommendation(s): Normal biventricular function without evidence of hemodynamically significant valvular heart disease. FINDINGS  Left Ventricle: Left ventricular ejection fraction, by estimation, is 60 to 65%. The left ventricle has normal function. The left ventricle has no regional wall motion abnormalities. The average left ventricular global longitudinal strain is -24.9 %. The global longitudinal strain is normal. The left ventricular internal cavity size was normal in size. There is no left ventricular hypertrophy. Left ventricular diastolic parameters were normal. Right Ventricle: The right ventricular size is normal. No increase in right ventricular wall thickness. Right ventricular systolic function is normal. Left Atrium: Left atrial size was normal in size. Right Atrium: Right atrial size was normal in size. Pericardium: There is no evidence of pericardial effusion. Mitral Valve: The mitral valve is normal in structure. No evidence of mitral valve regurgitation. No evidence of mitral valve stenosis. Tricuspid Valve: The tricuspid valve is grossly normal. Tricuspid valve regurgitation is not demonstrated. No evidence of tricuspid stenosis. Aortic Valve: The aortic valve is tricuspid. Aortic valve regurgitation is not visualized. No aortic stenosis is present. Pulmonic Valve: The pulmonic valve was not well visualized. Pulmonic valve regurgitation is not visualized. No evidence of  pulmonic stenosis. Aorta: The aortic root, ascending aorta, aortic arch and descending aorta are all structurally normal, with no evidence of dilitation or obstruction. Venous: The inferior vena cava is normal in size with greater than 50% respiratory variability, suggesting right atrial pressure of 3 mmHg. IAS/Shunts: No atrial level shunt detected by color flow Doppler.  LEFT VENTRICLE PLAX 2D LVIDd:         5.20 cm   Diastology LVIDs:         2.80 cm   LV e' medial:    12.80 cm/s LV PW:         0.70 cm   LV E/e' medial:  6.8 LV IVS:        0.70 cm   LV e' lateral:   14.10 cm/s LVOT diam:     2.20 cm   LV E/e' lateral: 6.2 LV SV:         93 LV SV Index:   39        2D Longitudinal Strain LVOT Area:     3.80 cm  2D Strain GLS (A2C):   -25.7 %                          2D Strain GLS (A3C):   -23.8 %                          2D Strain GLS (A4C):   -25.3 %                          2D  Strain GLS Avg:     -24.9 % RIGHT VENTRICLE RV S prime:     14.80 cm/s TAPSE (M-mode): 2.8 cm LEFT ATRIUM             Index        RIGHT ATRIUM           Index LA diam:        4.20 cm 1.78 cm/m   RA Area:     14.20 cm LA Vol (A2C):   20.5 ml 8.70 ml/m   RA Volume:   33.40 ml  14.18 ml/m LA Vol (A4C):   38.1 ml 16.17 ml/m LA Biplane Vol: 28.1 ml 11.93 ml/m  AORTIC VALVE LVOT Vmax:   123.00 cm/s LVOT Vmean:  78.900 cm/s LVOT VTI:    0.244 m  AORTA Ao Root diam: 2.70 cm Ao Asc diam:  2.90 cm MITRAL VALVE MV Area (PHT): 4.60 cm    SHUNTS MV Decel Time: 165 msec    Systemic VTI:  0.24 m MV E velocity: 87.00 cm/s  Systemic Diam: 2.20 cm MV A velocity: 66.00 cm/s MV E/A ratio:  1.32 Buford Dresser MD Electronically signed by Buford Dresser MD Signature Date/Time: 08/20/2022/4:56:52 PM    Final     ROS:  As stated above in the HPI otherwise negative.  Blood pressure (!) 113/55, pulse 75, temperature 97.9 F (36.6 C), temperature source Oral, resp. rate 20, height 5\' 5"  (1.651 m), weight (!) 136.9 kg, SpO2 100 %,  currently breastfeeding.    PE: Gen: NAD, Alert and Oriented HEENT:  [place]/AT, EOMI Neck: Supple, no LAD Lungs: CTA Bilaterally CV: RRR without M/G/R ABD: Soft, NTND, +BS Ext: No C/C/E  Assessment/Plan: 1) Choledocholithiasis. 2) Abnormal liver enzymes - obstructive pattern. 3) Morbid obesity.   The results of the MRCP were discussed with the patient.  The plan is for an ERCP with stone extraction.  The procedure was discussed with the patient and its potential complications, namely, pancreatitis.  She will proceed with the ERCP.  Plan: 1) Hopefully an ERCP today.  If it cannot be performed today or if it is not successful, the next availability will be on Friday. 2) Maintain NPO. 3) D/C Lovenox injection for today.  Mammie Meras D 08/22/2022, 8:23 AM

## 2022-08-22 NOTE — Anesthesia Postprocedure Evaluation (Signed)
Anesthesia Post Note  Patient: Karina [name]  Procedure(s) Performed: ENDOSCOPIC RETROGRADE CHOLANGIOPANCREATOGRAPHY (ERCP) SPHINCTEROTOMY REMOVAL OF STONES     Patient location during evaluation: Endoscopy Anesthesia Type: General Level of consciousness: awake Pain management: pain level controlled Vital Signs Assessment: post-procedure vital signs reviewed and stable Respiratory status: spontaneous breathing, nonlabored ventilation and respiratory function stable Cardiovascular status: blood pressure returned to baseline and stable Postop Assessment: no apparent nausea or vomiting Anesthetic complications: no   No notable events documented.  Last Vitals:  Vitals:   08/22/22 1320 08/22/22 1334  BP: 110/60 (!) 108/53  Pulse: 60 62  Resp: 16 18  Temp:  36.7 C  SpO2: 100% 98%    Last Pain:  Vitals:   08/22/22 1334  TempSrc: Oral  PainSc:                  Sascha Baugher P Grisell Bissette

## 2022-08-22 NOTE — Transfer of Care (Signed)
Immediate Anesthesia Transfer of Care Note  Patient: Karina [name]  Procedure(s) Performed: ENDOSCOPIC RETROGRADE CHOLANGIOPANCREATOGRAPHY (ERCP) SPHINCTEROTOMY REMOVAL OF STONES  Patient Location: Endoscopy Unit  Anesthesia Type:General  Level of Consciousness: drowsy and patient cooperative  Airway & Oxygen Therapy: Patient Spontanous Breathing and Patient connected to face mask oxygen  Post-op Assessment: Report given to RN and Post -op Vital signs reviewed and stable  Post vital signs: Reviewed and stable  Last Vitals:  Vitals Value Taken Time  BP 121/52 08/22/22 1250  Temp    Pulse 73 08/22/22 1251  Resp 0 08/22/22 1251  SpO2 100 % 08/22/22 1251  Vitals shown include unvalidated device data.  Last Pain:  Vitals:   08/22/22 1249  TempSrc: Temporal  PainSc:       Patients Stated Pain Goal: 0 (08/21/22 0948)  Complications: No notable events documented.

## 2022-08-22 NOTE — Op Note (Signed)
[facility] Patient Name: Karina [name] Procedure Date: 08/22/2022 MRN: [id number] Attending MD: Jeani Hawking , MD, [id number] Date of Birth: Jul 02, 1981 CSN: [id number] Age: 41 Admit Type: Inpatient Procedure:                ERCP Indications:              Common bile duct stone(s) Providers:                Jeani Hawking, MD, Lorenza Evangelist, RN, Salley Scarlet, Technician Referring MD:              Medicines:                General Anesthesia Complications:            No immediate complications. Estimated Blood Loss:     Estimated blood loss: none. Procedure:                Pre-Anesthesia Assessment:                           - Prior to the procedure, a History and Physical                            was performed, and patient medications and                            allergies were reviewed. The patient's tolerance of                            previous anesthesia was also reviewed. The risks                            and benefits of the procedure and the sedation                            options and risks were discussed with the patient.                            All questions were answered, and informed consent                            was obtained. Prior Anticoagulants: The patient has                            taken no anticoagulant or antiplatelet agents. ASA                            Grade Assessment: III - A patient with severe                            systemic disease. After reviewing the risks and  benefits, the patient was deemed in satisfactory                            condition to undergo the procedure.                           - Sedation was administered by an anesthesia                            professional. General anesthesia was attained.                           After obtaining informed consent, the scope was                            passed under direct vision.  Throughout the                            procedure, the patient's blood pressure, pulse, and                            oxygen saturations were monitored continuously. The                            [id number]-Q190V ([id number] Olympus duodenoscope was                            introduced through the mouth, and used to inject                            contrast into and used to inject contrast into the                            bile duct. The ERCP was accomplished without                            difficulty. The patient tolerated the procedure                            well. Scope In: Scope Out: Findings:      The major papilla was normal. The bile duct was deeply cannulated with       the short-nosed traction sphincterotome. Contrast was injected. I       personally interpreted the bile duct images. There was brisk flow of       contrast through the ducts. Image quality was excellent. Contrast       extended to the main bile duct. The common bile duct contained filling       defect(s) thought to be a stone. A short 0.035 inch Soft Jagwire was       passed into the biliary tree. A 12 mm biliary sphincterotomy was made       with a traction (standard) sphincterotome using ERBE electrocautery.       There was no post-sphincterotomy bleeding. The biliary tree was swept       with a 12 mm balloon  starting at the bifurcation. Sludge was swept from       the duct. All stones were removed. Nothing was found.      Cannulation was performed with ease, however, the wire was repeatedly       going into the long cystic duct with a low take off. After multiple       repositionings the guidewire was able to be advanced up into the left       hepatic ducts. Contarst injection revealed a mid to distl CBD filling       defect. A generous sphincterotomy was created and there was drainage of       sludge. The biliary tract was swept four times and a stone as well as       sludge were all extracted. The final  occlusion cholangiogram was       negative for any retained stones with fluoroscopic examination. Impression:               - The major papilla appeared normal.                           - A filling defect consistent with a stone was seen                            on the cholangiogram.                           - Choledocholithiasis was found. Complete removal                            was accomplished by biliary sphincterotomy and                            balloon extraction.                           - A biliary sphincterotomy was performed.                           - The biliary tree was swept and nothing was found. Moderate Sedation:      Not Applicable - Patient had care per Anesthesia. Recommendation:           - Return patient to hospital ward for ongoing care.                           - Resume regular diet.                           - Follow liver enzymes.                           - If asymptomatic tomorrow she can be discharged                            home with follow up with Dr. Loreta AveMann in 2-4 weeks. Procedure Code(s):        --- Professional ---  [phone], Endoscopic retrograde                            cholangiopancreatography (ERCP); with removal of                            calculi/debris from biliary/pancreatic duct(s)                           [id number], Endoscopic retrograde                            cholangiopancreatography (ERCP); with                            sphincterotomy/papillotomy                           [phone], Endoscopic catheterization of the biliary                            ductal system, radiological supervision and                            interpretation Diagnosis Code(s):        --- Professional ---                           K80.50, Calculus of bile duct without cholangitis                            or cholecystitis without obstruction                           R93.2, Abnormal findings on diagnostic imaging of                             liver and biliary tract CPT copyright 2022 [employer]. All rights reserved. The codes documented in this report are preliminary and upon coder review may  be revised to meet current compliance requirements. Jeani Hawking, MD Jeani Hawking, MD 08/22/2022 12:43:25 PM This report has been signed electronically. Number of Addenda: 0

## 2022-08-22 NOTE — Anesthesia Procedure Notes (Signed)
Procedure Name: Intubation Date/Time: 08/22/2022 12:40 PM  Performed by: Nikolette Reindl D, CRNAPre-anesthesia Checklist: Patient identified, Emergency Drugs available, Suction available and Patient being monitored Patient Re-evaluated:Patient Re-evaluated prior to induction Oxygen Delivery Method: Circle system utilized Preoxygenation: Pre-oxygenation with 100% oxygen Induction Type: IV induction Ventilation: Mask ventilation without difficulty Laryngoscope Size: Mac and 4 Grade View: Grade I Tube type: Oral Tube size: 7.0 mm Number of attempts: 1 Airway Equipment and Method: Stylet and Oral airway Placement Confirmation: ETT inserted through vocal cords under direct vision, positive ETCO2 and breath sounds checked- equal and bilateral Secured at: 21 cm Tube secured with: Tape Dental Injury: Teeth and Oropharynx as per pre-operative assessment

## 2022-08-22 NOTE — Progress Notes (Signed)
[facility] Progress Note Patient: Karina [name] EZM:[id number] DOB: 1981-01-07 DOA: 08/19/2022  DOS: the patient was seen and examined on 08/22/2022  Brief hospital course: Karina [name] is a 41 y.o. 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 Assessment and Plan: Choledocholithiasis. Transaminitis Likely causing patient's chest pain. LFT currently improving. Underwent ERCP on 12/13. Stones were removed. Outpatient follow-up with GI recommended.  Chest pain. Cardiac workup as well as PE negative. Echocardiogram shows preserved EF. Outpatient follow-up with GI recommended.  Asthma. Stable. Monitor.  Dysuria. Resolved. Monitor.  Iron deficiency anemia. Patient has chronic iron deficiency anemia. Workup in the past actually has been unremarkable for any significant trait but does have a hemoglobin C. Has followed up with hematology outpatient. Monitor.  Morbid obesity. Body mass index is 50.22 kg/m.  Placing the patient at high risk for poor outcome.   Subjective: No nausea no vomiting no fever no chills.  Seen after the procedure.  Passing gas.  Physical Exam: General: in mild distress;  Cardiovascular: S1 and S2 Present, no Murmur Respiratory: good respiratory effort, Bilateral Air entry present, no Crackles, no wheezes Abdomen: Bowel Sound present, Non tender  Extremities: no edma Neurology: alert and oriented to time, place, and person   Data Reviewed: I have Reviewed nursing notes, Vitals, and Lab results. Since last encounter, pertinent lab results CBC and CMP   . I have ordered test including CBC and CMP  .   Disposition: Status is: Inpatient Remains inpatient appropriate because: Need for observation overnight after  sphincterectomy  Family Communication: No one at bedside Level of care: Telemetry switch to MedSurg.  Vitals:   08/22/22 1310 08/22/22 1319 08/22/22 1320 08/22/22 1334  BP: (!) 116/47 (!) 116/47 110/60 (!) 108/53  Pulse: 72 66 60 62  Resp: 14 12 16 18   Temp:    98 F (36.7 C)  TempSrc:    Oral  SpO2: 99% 100% 100% 98%  Weight:      Height:         Author: , MD 08/22/2022 7:54 PM  Please look on [email or web address] to find out who is on call.

## 2022-08-22 NOTE — Plan of Care (Signed)
Patient lying in bed with spouse at bedside.  Temporay feeling of light headedness at beginning of shift back to normal. Iv infusiing .

## 2022-08-22 NOTE — Anesthesia Preprocedure Evaluation (Addendum)
Anesthesia Evaluation  Patient identified by MRN, date of birth, ID band Patient awake    Reviewed: Allergy & Precautions, NPO status , Patient's Chart, lab work & pertinent test results  Airway Mallampati: I  TM Distance: >3 FB Neck ROM: Full    Dental no notable dental hx.    Pulmonary asthma , former smoker   Pulmonary exam normal        Cardiovascular negative cardio ROS Normal cardiovascular exam     Neuro/Psych  PSYCHIATRIC DISORDERS Anxiety Depression    negative neurological ROS     GI/Hepatic negative GI ROS, Neg liver ROS,,,  Endo/Other    Morbid obesity  Renal/GU negative Renal ROS     Musculoskeletal negative musculoskeletal ROS (+)    Abdominal  (+) + obese  Peds  Hematology  (+) Blood dyscrasia, anemia   Anesthesia Other Findings Choledocholithiasis  Reproductive/Obstetrics Hcg negative                             Anesthesia Physical Anesthesia Plan  ASA: 3  Anesthesia Plan: General   Post-op Pain Management:    Induction: Intravenous  PONV Risk Score and Plan: 3 and Ondansetron, Dexamethasone, Midazolam and Treatment may vary due to age or medical condition  Airway Management Planned: Oral ETT  Additional Equipment:   Intra-op Plan:   Post-operative Plan: Extubation in OR  Informed Consent: I have reviewed the patients History and Physical, chart, labs and discussed the procedure including the risks, benefits and alternatives for the proposed anesthesia with the patient or authorized representative who has indicated his/her understanding and acceptance.     Dental advisory given  Plan Discussed with: CRNA  Anesthesia Plan Comments:        Anesthesia Quick Evaluation

## 2022-08-23 ENCOUNTER — Other Ambulatory Visit (HOSPITAL_COMMUNITY): Payer: Self-pay

## 2022-08-23 DIAGNOSIS — R079 Chest pain, unspecified: Secondary | ICD-10-CM | POA: Diagnosis not present

## 2022-08-23 LAB — COMPREHENSIVE METABOLIC PANEL
ALT: 127 U/L — ABNORMAL HIGH (ref 0–44)
AST: 90 U/L — ABNORMAL HIGH (ref 15–41)
Albumin: 2.6 g/dL — ABNORMAL LOW (ref 3.5–5.0)
Alkaline Phosphatase: 292 U/L — ABNORMAL HIGH (ref 38–126)
Anion gap: 4 — ABNORMAL LOW (ref 5–15)
BUN: 13 mg/dL (ref 6–20)
CO2: 21 mmol/L — ABNORMAL LOW (ref 22–32)
Calcium: 8.5 mg/dL — ABNORMAL LOW (ref 8.9–10.3)
Chloride: 109 mmol/L (ref 98–111)
Creatinine, Ser: 0.73 mg/dL (ref 0.44–1.00)
GFR, Estimated: >60 mL/min (ref >60)
Glucose, Bld: 142 mg/dL — ABNORMAL HIGH (ref 70–99)
Potassium: 4.4 mmol/L (ref 3.5–5.1)
Sodium: 134 mmol/L — ABNORMAL LOW (ref 135–145)
Total Bilirubin: 1.2 mg/dL (ref 0.3–1.2)
Total Protein: 8.3 g/dL — ABNORMAL HIGH (ref 6.5–8.1)

## 2022-08-23 LAB — IRON AND TIBC
Iron: 25 ug/dL — ABNORMAL LOW (ref 28–170)
Saturation Ratios: 6 % — ABNORMAL LOW (ref 10.4–31.8)
TIBC: 400 ug/dL (ref 250–450)
UIBC: 375 ug/dL

## 2022-08-23 LAB — MAGNESIUM: Magnesium: 2.2 mg/dL (ref 1.7–2.4)

## 2022-08-23 LAB — FERRITIN: Ferritin: 17 ng/mL (ref 11–307)

## 2022-08-23 LAB — FOLATE: Folate: 7.2 ng/mL (ref >5.9)

## 2022-08-23 LAB — VITAMIN B12: Vitamin B-12: 416 pg/mL (ref 180–914)

## 2022-08-23 MED ORDER — PANTOPRAZOLE SODIUM 40 MG PO TBEC
40.0000 mg | DELAYED_RELEASE_TABLET | Freq: Every day | ORAL | 0 refills | Status: AC
  Filled 2022-08-23: qty 14, 14d supply, fill #0

## 2022-08-23 MED ORDER — PHENAZOPYRIDINE HCL 200 MG PO TABS
200.0000 mg | ORAL_TABLET | Freq: Three times a day (TID) | ORAL | 0 refills | Status: DC
  Filled 2022-08-23: qty 10, 4d supply, fill #0

## 2022-08-23 MED ORDER — SODIUM CHLORIDE 0.9 % IV SOLN: 200.0000 mg | Freq: Once | INTRAVENOUS | Status: DC

## 2022-08-23 MED ORDER — SODIUM CHLORIDE 0.9 % IV SOLN
250.0000 mg | Freq: Once | INTRAVENOUS | Status: AC
  Administered 2022-08-23: 250 mg via INTRAVENOUS
  Filled 2022-08-23: qty 15

## 2022-08-23 MED ORDER — B COMPLEX-C PO TABS
1.0000 | ORAL_TABLET | Freq: Every day | ORAL | 0 refills | Status: AC
  Filled 2022-08-23: qty 120, 120d supply, fill #0

## 2022-08-23 NOTE — H&P (View-Only) (Signed)
Subjective: Feeling better.  No complaints.  Objective: Vital signs in last 24 hours: Temp:  [97.7 F (36.5 C)-97.9 F (36.6 C)] 97.9 F (36.6 C) (12/14 1332) Pulse Rate:  [45-130] 84 (12/14 1332) Resp:  [18-20] 20 (12/14 1332) BP: (108-131)/(62-75) 131/75 (12/14 1332) SpO2:  [98 %-100 %] 100 % (12/14 1332) Last BM Date : 08/21/22  Intake/Output from previous day: 12/13 0701 - 12/14 0700 In: 1930 [I.V.:1730; IV Piggyback:200] Out: 0  Intake/Output this shift: No intake/output data recorded.  General appearance: alert and no distress GI: minimal epigastric pain  Lab Results: Recent Labs    08/21/22 1109 08/22/22 0435  WBC 9.3 10.4  HGB 10.8* 9.8*  HCT 33.4* 30.2*  PLT 330 312   BMET Recent Labs    08/21/22 1109 08/22/22 0435 08/23/22 0437  NA 130* 135 134*  K 5.2* 3.8 4.4  CL 104 106 109  CO2 22 24 21*  GLUCOSE 97 83 142*  BUN 10 11 13  CREATININE 0.69 0.87 0.73  CALCIUM 8.4* 8.3* 8.5*   LFT Recent Labs    08/22/22 0435 08/23/22 0437  PROT 7.8 8.3*  ALBUMIN 2.5* 2.6*  AST 107* 90*  ALT 135* 127*  ALKPHOS 285* 292*  BILITOT 1.8* 1.2  BILIDIR 0.8*  --   IBILI 1.0*  --    PT/INR No results for input(s): "LABPROT", "INR" in the last 72 hours. Hepatitis Panel Recent Labs    08/21/22 1109  HEPBSAG NON REACTIVE  HCVAB NON REACTIVE  HEPAIGM NON REACTIVE  HEPBIGM NON REACTIVE   C-Diff No results for input(s): "CDIFFTOX" in the last 72 hours. Fecal Lactopherrin No results for input(s): "FECLLACTOFRN" in the last 72 hours.  Studies/Results: DG ERCP  Result Date: 08/22/2022 CLINICAL DATA:  Choledocholithiasis EXAM: ERCP COMPARISON:  [place] IOC 01/11/2012. CT AP, 07/06/2022. MRCP, 08/21/2022. FLUOROSCOPY: Exposure Index (as provided by the fluoroscopic device): 185 mGy Kerma FINDINGS: Multiple, limited oblique planar images of the RIGHT upper quadrant obtained C-arm. Images demonstrating flexible endoscopy, biliary duct cannulation, sphincterotomy,  retrograde cholangiogram and balloon sweep. No over biliary ductal dilation. Multiple common bile duct filling defects are present. Balloon sweep with no evidence of residual. IMPRESSION: Fluoroscopic imaging for ERCP. Multiple CBD filling defects are present on initial images, with successful balloon sweep and no evidence of residual. For complete description of intra procedural findings, please see performing service dictation. Electronically Signed   By: Jon  Mugweru M.D.   On: 08/22/2022 12:56   MR ABDOMEN MRCP W WO CONTAST  Result Date: 08/22/2022 CLINICAL DATA:  Biliary obstruction suspected. EXAM: MRI ABDOMEN WITHOUT AND WITH CONTRAST (INCLUDING MRCP) TECHNIQUE: Multiplanar multisequence MR imaging of the abdomen was performed both before and after the administration of intravenous contrast. Heavily T2-weighted images of the biliary and pancreatic ducts were obtained, and three-dimensional MRCP images were rendered by post processing. CONTRAST:  10mL GADAVIST GADOBUTROL 1 MMOL/ML IV SOLN COMPARISON:  CT 07/06/2022 FINDINGS: Portions of exam, especially the MRCP dedicated images, are degraded by patient body habitus and less so motion. Lower chest: Normal heart size without pericardial or pleural effusion. Hepatobiliary: Mild hepatic steatosis. Suspect mild cirrhosis, as evidenced by caudate lobe enlargement and medial segment left liver lobe atrophy. A too small to characterize observation within segment 7 of 1.0 cm on 18/16 demonstrates arterial phase hypoenhancement and relative Iso enhancement on later post-contrast imaging. Cholecystectomy. There is mild intrahepatic biliary duct dilatation, with an index left hepatic duct measuring 7 mm on 15/4. This is new since   05/10/2016. Mild enhancement is suspected surrounding intrahepatic ducts, most apparent within the right hepatic lobe including on 34/20 and 30/24. The common duct is minimally dilated at 11 mm in the porta hepatis on 16/5. Tapers gradually  in the region of the pancreatic head. This is also new since 2017. There is an equivocal 3 mm filling defect within the distal common duct only identified on 10/12. Pancreas:  Normal, without mass or ductal dilatation. Spleen:  Mild splenomegaly, 13.1 cm. Adrenals/Urinary Tract: Normal adrenal glands. Normal kidneys, without hydronephrosis. Stomach/Bowel: Lap band.  Normal abdominal bowel loops. Vascular/Lymphatic: Normal caliber of the aorta and branch vessels. Patent portal and splenic veins without evidence of portal venous hypertension. Prominent porta hepatis nodes are likely related to the suspected cirrhosis. Other:  No ascites. Musculoskeletal: No acute osseous abnormality. 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 IMPRESSION: 1. Patient body habitus and less so motion mildly degraded exam. 2. Cholecystectomy with intrahepatic and less so extrahepatic biliary duct dilatation, new since 2017. Equivocal 3 mm distal common duct stone. Suggestion of mild enhancement surrounding intrahepatic ducts. Consider ERCP to evaluate for subtle choledocholithiasis and/or infectious versus inflammatory cholangitis. 3. Mild hepatic steatosis.  Suspicion of mild cirrhosis. 4. Too small to characterize segment 7 liver lesion does not have suspicious characteristics. If the patient is eventually diagnosed with cirrhosis, this would be characterized as LR 3 and warrant follow-up MRI at 6 months. 5. Mild splenomegaly Electronically Signed   By: Jeronimo Greaves M.D.   On: 08/22/2022 08:09   MR 3D Recon At Scanner  Result Date: 08/22/2022 CLINICAL DATA:  Biliary obstruction suspected. EXAM: MRI ABDOMEN WITHOUT AND WITH CONTRAST (INCLUDING MRCP) TECHNIQUE: Multiplanar multisequence MR imaging of the abdomen was performed both before and after the administration of intravenous contrast. Heavily T2-weighted images of the biliary and pancreatic ducts were obtained, and three-dimensional MRCP images were rendered by post  processing. CONTRAST:  71mL GADAVIST GADOBUTROL 1 MMOL/ML IV SOLN COMPARISON:  CT 07/06/2022 FINDINGS: Portions of exam, especially the MRCP dedicated images, are degraded by patient body habitus and less so motion. Lower chest: Normal heart size without pericardial or pleural effusion. Hepatobiliary: Mild hepatic steatosis. Suspect mild cirrhosis, as evidenced by caudate lobe enlargement and medial segment left liver lobe atrophy. A too small to characterize observation within segment 7 of 1.0 cm on 18/16 demonstrates arterial phase hypoenhancement and relative Iso enhancement on later post-contrast imaging. Cholecystectomy. There is mild intrahepatic biliary duct dilatation, with an index left hepatic duct measuring 7 mm on 15/4. This is new since 05/10/2016. Mild enhancement is suspected surrounding intrahepatic ducts, most apparent within the right hepatic lobe including on 34/20 and 30/24. The common duct is minimally dilated at 11 mm in the porta hepatis on 16/5. Tapers gradually in the region of the pancreatic head. This is also new since 2017. There is an equivocal 3 mm filling defect within the distal common duct only identified on 10/12. Pancreas:  Normal, without mass or ductal dilatation. Spleen:  Mild splenomegaly, 13.1 cm. Adrenals/Urinary Tract: Normal adrenal glands. Normal kidneys, without hydronephrosis. Stomach/Bowel: Lap band.  Normal abdominal bowel loops. Vascular/Lymphatic: Normal caliber of the aorta and branch vessels. Patent portal and splenic veins without evidence of portal venous hypertension. Prominent porta hepatis nodes are likely related to the suspected cirrhosis. Other:  No ascites. Musculoskeletal: No acute osseous abnormality. 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 IMPRESSION: 1. Patient body habitus and less so motion mildly degraded exam. 2. Cholecystectomy with intrahepatic and less  so extrahepatic biliary duct dilatation, new since 2017. Equivocal 3 mm distal  common duct stone. Suggestion of mild enhancement surrounding intrahepatic ducts. Consider ERCP to evaluate for subtle choledocholithiasis and/or infectious versus inflammatory cholangitis. 3. Mild hepatic steatosis.  Suspicion of mild cirrhosis. 4. Too small to characterize segment 7 liver lesion does not have suspicious characteristics. If the patient is eventually diagnosed with cirrhosis, this would be characterized as LR 3 and warrant follow-up MRI at 6 months. 5. Mild splenomegaly Electronically Signed   By: Kyle  Talbot M.D.   On: 08/22/2022 08:09    Medications: Scheduled:  pantoprazole  40 mg Oral Daily   phenazopyridine  200 mg Oral TID WC   sodium chloride flush  3 mL Intravenous Q12H   Continuous:  Assessment/Plan: 1) Choledocholithiasis. 2) Noncardiac chest pain - resolved.   No post-ERCP complications.  She feels well and there is a definite improvement in her pain complaints.  She is tolerating PO without any difficulty.  Plan: 1) Follow up with Dr. Mann in 2-4 weeks. 2) Signing off.  LOS: 2 days   Olivine Hiers D 08/23/2022, 2:57 PM 

## 2022-08-23 NOTE — Progress Notes (Signed)
Subjective: Feeling better.  No complaints.  Objective: Vital signs in last 24 hours: Temp:  [97.7 F (36.5 C)-97.9 F (36.6 C)] 97.9 F (36.6 C) (12/14 1332) Pulse Rate:  [45-130] 84 (12/14 1332) Resp:  [18-20] 20 (12/14 1332) BP: (108-131)/(62-75) 131/75 (12/14 1332) SpO2:  [98 %-100 %] 100 % (12/14 1332) Last BM Date : 08/21/22  Intake/Output from previous day: 12/13 0701 - 12/14 0700 In: 1930 [I.V.:1730; IV Piggyback:200] Out: 0  Intake/Output this shift: No intake/output data recorded.  General appearance: alert and no distress GI: minimal epigastric pain  Lab Results: Recent Labs    08/21/22 1109 08/22/22 0435  WBC 9.3 10.4  HGB 10.8* 9.8*  HCT 33.4* 30.2*  PLT 330 312   BMET Recent Labs    08/21/22 1109 08/22/22 0435 08/23/22 0437  NA 130* 135 134*  K 5.2* 3.8 4.4  CL 104 106 109  CO2 22 24 21*  GLUCOSE 97 83 142*  BUN 10 11 13   CREATININE 0.69 0.87 0.73  CALCIUM 8.4* 8.3* 8.5*   LFT Recent Labs    08/22/22 0435 08/23/22 0437  PROT 7.8 8.3*  ALBUMIN 2.5* 2.6*  AST 107* 90*  ALT 135* 127*  ALKPHOS 285* 292*  BILITOT 1.8* 1.2  BILIDIR 0.8*  --   IBILI 1.0*  --    PT/INR No results for input(s): "LABPROT", "INR" in the last 72 hours. Hepatitis Panel Recent Labs    08/21/22 1109  HEPBSAG NON REACTIVE  HCVAB NON REACTIVE  HEPAIGM NON REACTIVE  HEPBIGM NON REACTIVE   C-Diff No results for input(s): "CDIFFTOX" in the last 72 hours. Fecal Lactopherrin No results for input(s): "FECLLACTOFRN" in the last 72 hours.  Studies/Results: DG ERCP  Result Date: 08/22/2022 CLINICAL DATA:  Choledocholithiasis EXAM: ERCP COMPARISON:  [place] IOC 01/11/2012. CT AP, 07/06/2022. MRCP, 08/21/2022. FLUOROSCOPY: Exposure Index (as provided by the fluoroscopic device): 185 mGy Kerma FINDINGS: Multiple, limited oblique planar images of the RIGHT upper quadrant obtained C-arm. Images demonstrating flexible endoscopy, biliary duct cannulation, sphincterotomy,  retrograde cholangiogram and balloon sweep. No over biliary ductal dilation. Multiple common bile duct filling defects are present. Balloon sweep with no evidence of residual. IMPRESSION: Fluoroscopic imaging for ERCP. Multiple CBD filling defects are present on initial images, with successful balloon sweep and no evidence of residual. For complete description of intra procedural findings, please see performing service dictation. Electronically Signed   By: 14/08/2022 M.D.   On: 08/22/2022 12:56   MR ABDOMEN MRCP W WO CONTAST  Result Date: 08/22/2022 CLINICAL DATA:  Biliary obstruction suspected. EXAM: MRI ABDOMEN WITHOUT AND WITH CONTRAST (INCLUDING MRCP) TECHNIQUE: Multiplanar multisequence MR imaging of the abdomen was performed both before and after the administration of intravenous contrast. Heavily T2-weighted images of the biliary and pancreatic ducts were obtained, and three-dimensional MRCP images were rendered by post processing. CONTRAST:  70mL GADAVIST GADOBUTROL 1 MMOL/ML IV SOLN COMPARISON:  CT 07/06/2022 FINDINGS: Portions of exam, especially the MRCP dedicated images, are degraded by patient body habitus and less so motion. Lower chest: Normal heart size without pericardial or pleural effusion. Hepatobiliary: Mild hepatic steatosis. Suspect mild cirrhosis, as evidenced by caudate lobe enlargement and medial segment left liver lobe atrophy. A too small to characterize observation within segment 7 of 1.0 cm on 18/16 demonstrates arterial phase hypoenhancement and relative Iso enhancement on later post-contrast imaging. Cholecystectomy. There is mild intrahepatic biliary duct dilatation, with an index left hepatic duct measuring 7 mm on 15/4. This is new since  05/10/2016. Mild enhancement is suspected surrounding intrahepatic ducts, most apparent within the right hepatic lobe including on 34/20 and 30/24. The common duct is minimally dilated at 11 mm in the porta hepatis on 16/5. Tapers gradually  in the region of the pancreatic head. This is also new since 2017. There is an equivocal 3 mm filling defect within the distal common duct only identified on 10/12. Pancreas:  Normal, without mass or ductal dilatation. Spleen:  Mild splenomegaly, 13.1 cm. Adrenals/Urinary Tract: Normal adrenal glands. Normal kidneys, without hydronephrosis. Stomach/Bowel: Lap band.  Normal abdominal bowel loops. Vascular/Lymphatic: Normal caliber of the aorta and branch vessels. Patent portal and splenic veins without evidence of portal venous hypertension. Prominent porta hepatis nodes are likely related to the suspected cirrhosis. Other:  No ascites. Musculoskeletal: No acute osseous abnormality. 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 IMPRESSION: 1. Patient body habitus and less so motion mildly degraded exam. 2. Cholecystectomy with intrahepatic and less so extrahepatic biliary duct dilatation, new since 2017. Equivocal 3 mm distal common duct stone. Suggestion of mild enhancement surrounding intrahepatic ducts. Consider ERCP to evaluate for subtle choledocholithiasis and/or infectious versus inflammatory cholangitis. 3. Mild hepatic steatosis.  Suspicion of mild cirrhosis. 4. Too small to characterize segment 7 liver lesion does not have suspicious characteristics. If the patient is eventually diagnosed with cirrhosis, this would be characterized as LR 3 and warrant follow-up MRI at 6 months. 5. Mild splenomegaly Electronically Signed   By: Jeronimo Greaves M.D.   On: 08/22/2022 08:09   MR 3D Recon At Scanner  Result Date: 08/22/2022 CLINICAL DATA:  Biliary obstruction suspected. EXAM: MRI ABDOMEN WITHOUT AND WITH CONTRAST (INCLUDING MRCP) TECHNIQUE: Multiplanar multisequence MR imaging of the abdomen was performed both before and after the administration of intravenous contrast. Heavily T2-weighted images of the biliary and pancreatic ducts were obtained, and three-dimensional MRCP images were rendered by post  processing. CONTRAST:  71mL GADAVIST GADOBUTROL 1 MMOL/ML IV SOLN COMPARISON:  CT 07/06/2022 FINDINGS: Portions of exam, especially the MRCP dedicated images, are degraded by patient body habitus and less so motion. Lower chest: Normal heart size without pericardial or pleural effusion. Hepatobiliary: Mild hepatic steatosis. Suspect mild cirrhosis, as evidenced by caudate lobe enlargement and medial segment left liver lobe atrophy. A too small to characterize observation within segment 7 of 1.0 cm on 18/16 demonstrates arterial phase hypoenhancement and relative Iso enhancement on later post-contrast imaging. Cholecystectomy. There is mild intrahepatic biliary duct dilatation, with an index left hepatic duct measuring 7 mm on 15/4. This is new since 05/10/2016. Mild enhancement is suspected surrounding intrahepatic ducts, most apparent within the right hepatic lobe including on 34/20 and 30/24. The common duct is minimally dilated at 11 mm in the porta hepatis on 16/5. Tapers gradually in the region of the pancreatic head. This is also new since 2017. There is an equivocal 3 mm filling defect within the distal common duct only identified on 10/12. Pancreas:  Normal, without mass or ductal dilatation. Spleen:  Mild splenomegaly, 13.1 cm. Adrenals/Urinary Tract: Normal adrenal glands. Normal kidneys, without hydronephrosis. Stomach/Bowel: Lap band.  Normal abdominal bowel loops. Vascular/Lymphatic: Normal caliber of the aorta and branch vessels. Patent portal and splenic veins without evidence of portal venous hypertension. Prominent porta hepatis nodes are likely related to the suspected cirrhosis. Other:  No ascites. Musculoskeletal: No acute osseous abnormality. 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 IMPRESSION: 1. Patient body habitus and less so motion mildly degraded exam. 2. Cholecystectomy with intrahepatic and less  so extrahepatic biliary duct dilatation, new since 2017. Equivocal 3 mm distal  common duct stone. Suggestion of mild enhancement surrounding intrahepatic ducts. Consider ERCP to evaluate for subtle choledocholithiasis and/or infectious versus inflammatory cholangitis. 3. Mild hepatic steatosis.  Suspicion of mild cirrhosis. 4. Too small to characterize segment 7 liver lesion does not have suspicious characteristics. If the patient is eventually diagnosed with cirrhosis, this would be characterized as LR 3 and warrant follow-up MRI at 6 months. 5. Mild splenomegaly Electronically Signed   By: Jeronimo Greaves M.D.   On: 08/22/2022 08:09    Medications: Scheduled:  pantoprazole  40 mg Oral Daily   phenazopyridine  200 mg Oral TID WC   sodium chloride flush  3 mL Intravenous Q12H   Continuous:  Assessment/Plan: 1) Choledocholithiasis. 2) Noncardiac chest pain - resolved.   No post-ERCP complications.  She feels well and there is a definite improvement in her pain complaints.  She is tolerating PO without any difficulty.  Plan: 1) Follow up with Dr. Loreta Ave in 2-4 weeks. 2) Signing off.  LOS: 2 days   Bethani Brugger D 08/23/2022, 2:57 PM

## 2022-08-24 ENCOUNTER — Other Ambulatory Visit (HOSPITAL_COMMUNITY): Payer: Self-pay

## 2022-08-26 ENCOUNTER — Encounter: Payer: Self-pay | Admitting: Cardiovascular Disease

## 2022-08-27 ENCOUNTER — Ambulatory Visit (HOSPITAL_COMMUNITY): Payer: 59

## 2022-08-27 DIAGNOSIS — R1013 Epigastric pain: Secondary | ICD-10-CM | POA: Diagnosis not present

## 2022-08-28 ENCOUNTER — Other Ambulatory Visit (HOSPITAL_COMMUNITY): Payer: Self-pay

## 2022-08-28 ENCOUNTER — Other Ambulatory Visit: Payer: Self-pay | Admitting: Gastroenterology

## 2022-08-28 MED ORDER — CIPROFLOXACIN HCL 500 MG PO TABS
500.0000 mg | ORAL_TABLET | Freq: Two times a day (BID) | ORAL | 0 refills | Status: DC
  Filled 2022-08-28: qty 12, 6d supply, fill #0
  Filled 2022-08-28: qty 8, 4d supply, fill #0

## 2022-08-28 NOTE — Discharge Summary (Signed)
Physician Discharge Summary   Patient: Karina [name] MRN: [id number] DOB: [birth year]  Admit date:     08/19/2022  Discharge date: 08/23/2022  Discharge Physician: Lynden Oxford  PCP: Bailey Mech, PA-C  Recommendations at discharge:  Follow up with PCP in 1 week and Gastroenterology as recommended   Follow-up Information     Podraza, Rudy Jew, PA-C. Schedule an appointment as soon as possible for a visit in 1 week(s).   Specialty: Physician Assistant Contact information: [address] [facility] [place] [address] [phone]         Charna Elizabeth, MD. Schedule an appointment as soon as possible for a visit in 3 week(s).   Specialty: Gastroenterology Contact information: [address] [place] [address] [phone]                Discharge Diagnoses: Principal Problem:   Chest pain Active Problems:   Asthma   Iron deficiency anemia   Leukocytosis   Dysuria   Transaminitis  Hospital Course: Karina [name] is a 41 y.o. 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 Assessment and Plan  Choledocholithiasis. Transaminitis Likely causing patient's chest pain. LFT currently improving. Underwent ERCP on 12/13. Stones were removed. Outpatient follow-up with GI recommended.   Chest pain. Cardiac workup as well as PE negative. Echocardiogram shows preserved EF. Outpatient follow-up with GI recommended.   Asthma. Stable. Monitor.   Dysuria. Resolved. Monitor.   Iron deficiency anemia. Patient has chronic iron deficiency anemia. Workup in the past actually has been unremarkable for any significant trait but does have a hemoglobin C. Has followed up with hematology  outpatient.   Morbid obesity. Body mass index is 50.22 kg/m.  Placing the patient at high risk for poor outcome.   Consultants:  Gastroenterology   Procedures performed:  ERCP  DISCHARGE MEDICATION: Allergies as of 08/23/2022   No Known Allergies      Medication List     STOP taking these medications    ibuprofen 200 MG tablet Commonly known as: ADVIL   metoprolol tartrate 100 MG tablet Commonly known as: LOPRESSOR       TAKE these medications    acetaminophen 500 MG tablet Commonly known as: TYLENOL Take 1,000 mg by mouth every 6 (six) hours as needed for moderate pain.   ALPRAZolam 0.5 MG tablet Commonly known as: XANAX Take 1 tablet (0.5 mg total) by mouth daily as needed for anxiety   B-complex with vitamin C tablet Take 1 tablet by mouth daily.   calcium carbonate 750 MG chewable tablet Commonly known as: TUMS EX Chew 2 tablets by mouth daily as needed for heartburn.   Enstilar 0.005-0.064 % Foam Generic drug: Calcipotriene-Betameth Diprop Apply 1 application topically daily. What changed:  when to take this reasons to take this   furosemide 20 MG tablet Commonly known as: LASIX Take 1 tablet (20 mg total) by mouth every other day. What changed:  when to take this reasons to take this   imiquimod 5 % cream Commonly known as: ALDARA Apply topically 3 (three) times a week at bedtime until warts completely disappear up to a maximum of 12 weeks. What changed:  how much to take when to take this additional instructions   pantoprazole 40 MG tablet Commonly known as:  PROTONIX Take 1 tablet (40 mg total) by mouth daily.   phenazopyridine 200 MG tablet Commonly known as: PYRIDIUM Take 1 tablet (200 mg total) by mouth 3 (three) times daily with meals.   tiZANidine 2 MG tablet Commonly known as: ZANAFLEX Take 1-2 tablets (2-4 mg total) by mouth 3 (three) times daily as needed. What changed: reasons to take this   Ventolin HFA 108 (90  Base) MCG/ACT inhaler Generic drug: albuterol Inhale 2 puffs into the lungs every 4 (four) hours as needed for wheezing.       Disposition: Home Diet recommendation: Cardiac diet  Discharge Exam: Vitals:   08/22/22 2033 08/23/22 0535 08/23/22 0537 08/23/22 1332  BP: 109/64 108/62  131/75  Pulse: 77 (!) 130 (!) 45 84  Resp: Temp: 97.9 F (36.6 C) 97.7 F (36.5 C)  97.9 F (36.6 C)  TempSrc: Oral Oral  Oral  SpO2: 98% 99% 99% 100%  Weight:      Height:       General: Appear in mild distress; no visible Abnormal Neck Mass Or lumps, Conjunctiva normal Cardiovascular: S1 and S2 Present, no Murmur, Respiratory: good respiratory effort, Bilateral Air entry present and CTA, no Crackles, no wheezes Abdomen: Bowel Sound present, Non tender  Extremities: no Pedal edema Neurology: alert and oriented to time, place, and person  Filed Weights   08/19/22 1956  Weight: (!) 136.9 kg   Condition at discharge: stable  The results of significant diagnostics from this hospitalization (including imaging, microbiology, ancillary and laboratory) are listed below for reference.   Imaging Studies: DG ERCP  Result Date: 08/22/2022 CLINICAL DATA:  Choledocholithiasis EXAM: ERCP COMPARISON:  FL IOC 01/11/2012. CT AP, 07/06/2022. MRCP, 08/21/2022. FLUOROSCOPY: Exposure Index (as provided by the fluoroscopic device): 185 mGy Kerma FINDINGS: Multiple, limited oblique planar images of the RIGHT upper quadrant obtained C-arm. Images demonstrating flexible endoscopy, biliary duct cannulation, sphincterotomy, retrograde cholangiogram and balloon sweep. No over biliary ductal dilation. Multiple common bile duct filling defects are present. Balloon sweep with no evidence of residual. IMPRESSION: Fluoroscopic imaging for ERCP. Multiple CBD filling defects are present on initial images, with successful balloon sweep and no evidence of residual. For complete description of intra procedural findings,  please see performing service dictation. Electronically Signed   By: Roanna Banning M.D.   On: 08/22/2022 12:56   MR ABDOMEN MRCP W WO CONTAST  Result Date: 08/22/2022 CLINICAL DATA:  Biliary obstruction suspected. EXAM: MRI ABDOMEN WITHOUT AND WITH CONTRAST (INCLUDING MRCP) TECHNIQUE: Multiplanar multisequence MR imaging of the abdomen was performed both before and after the administration of intravenous contrast. Heavily T2-weighted images of the biliary and pancreatic ducts were obtained, and three-dimensional MRCP images were rendered by post processing. CONTRAST:  10mL GADAVIST GADOBUTROL 1 MMOL/ML IV SOLN COMPARISON:  CT 07/06/2022 FINDINGS: Portions of exam, especially the MRCP dedicated images, are degraded by patient body habitus and less so motion. Lower chest: Normal heart size without pericardial or pleural effusion. Hepatobiliary: Mild hepatic steatosis. Suspect mild cirrhosis, as evidenced by caudate lobe enlargement and medial segment left liver lobe atrophy. A too small to characterize observation within segment 7 of 1.0 cm on 18/16 demonstrates arterial phase hypoenhancement and relative Iso enhancement on later post-contrast imaging. Cholecystectomy. There is mild intrahepatic biliary duct dilatation, with an index left hepatic duct measuring 7 mm on 15/4. This is new since 05/10/2016. Mild enhancement is suspected surrounding intrahepatic ducts, most apparent within the right hepatic lobe including on 34/20  and 30/24. The common duct is minimally dilated at 11 mm in the porta hepatis on 16/5. Tapers gradually in the region of the pancreatic head. This is also new since 2017. There is an equivocal 3 mm filling defect within the distal common duct only identified on 10/12. Pancreas:  Normal, without mass or ductal dilatation. Spleen:  Mild splenomegaly, 13.1 cm. Adrenals/Urinary Tract: Normal adrenal glands. Normal kidneys, without hydronephrosis. Stomach/Bowel: Lap band.  Normal abdominal bowel  loops. Vascular/Lymphatic: Normal caliber of the aorta and branch vessels. Patent portal and splenic veins without evidence of portal venous hypertension. Prominent porta hepatis nodes are likely related to the suspected cirrhosis. Other:  No ascites. Musculoskeletal: No acute osseous abnormality. 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 IMPRESSION: 1. Patient body habitus and less so motion mildly degraded exam. 2. Cholecystectomy with intrahepatic and less so extrahepatic biliary duct dilatation, new since 2017. Equivocal 3 mm distal common duct stone. Suggestion of mild enhancement surrounding intrahepatic ducts. Consider ERCP to evaluate for subtle choledocholithiasis and/or infectious versus inflammatory cholangitis. 3. Mild hepatic steatosis.  Suspicion of mild cirrhosis. 4. Too small to characterize segment 7 liver lesion does not have suspicious characteristics. If the patient is eventually diagnosed with cirrhosis, this would be characterized as LR 3 and warrant follow-up MRI at 6 months. 5. Mild splenomegaly Electronically Signed   By: Jeronimo Greaves M.D.   On: 08/22/2022 08:09   MR 3D Recon At Scanner  Result Date: 08/22/2022 CLINICAL DATA:  Biliary obstruction suspected. EXAM: MRI ABDOMEN WITHOUT AND WITH CONTRAST (INCLUDING MRCP) TECHNIQUE: Multiplanar multisequence MR imaging of the abdomen was performed both before and after the administration of intravenous contrast. Heavily T2-weighted images of the biliary and pancreatic ducts were obtained, and three-dimensional MRCP images were rendered by post processing. CONTRAST:  58mL GADAVIST GADOBUTROL 1 MMOL/ML IV SOLN COMPARISON:  CT 07/06/2022 FINDINGS: Portions of exam, especially the MRCP dedicated images, are degraded by patient body habitus and less so motion. Lower chest: Normal heart size without pericardial or pleural effusion. Hepatobiliary: Mild hepatic steatosis. Suspect mild cirrhosis, as evidenced by caudate lobe enlargement  and medial segment left liver lobe atrophy. A too small to characterize observation within segment 7 of 1.0 cm on 18/16 demonstrates arterial phase hypoenhancement and relative Iso enhancement on later post-contrast imaging. Cholecystectomy. There is mild intrahepatic biliary duct dilatation, with an index left hepatic duct measuring 7 mm on 15/4. This is new since 05/10/2016. Mild enhancement is suspected surrounding intrahepatic ducts, most apparent within the right hepatic lobe including on 34/20 and 30/24. The common duct is minimally dilated at 11 mm in the porta hepatis on 16/5. Tapers gradually in the region of the pancreatic head. This is also new since 2017. There is an equivocal 3 mm filling defect within the distal common duct only identified on 10/12. Pancreas:  Normal, without mass or ductal dilatation. Spleen:  Mild splenomegaly, 13.1 cm. Adrenals/Urinary Tract: Normal adrenal glands. Normal kidneys, without hydronephrosis. Stomach/Bowel: Lap band.  Normal abdominal bowel loops. Vascular/Lymphatic: Normal caliber of the aorta and branch vessels. Patent portal and splenic veins without evidence of portal venous hypertension. Prominent porta hepatis nodes are likely related to the suspected cirrhosis. Other:  No ascites. Musculoskeletal: No acute osseous abnormality. 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 IMPRESSION: 1. Patient body habitus and less so motion mildly degraded exam. 2. Cholecystectomy with intrahepatic and less so extrahepatic biliary duct dilatation, new since 2017. Equivocal 3 mm distal common duct stone. Suggestion of mild  enhancement surrounding intrahepatic ducts. Consider ERCP to evaluate for subtle choledocholithiasis and/or infectious versus inflammatory cholangitis. 3. Mild hepatic steatosis.  Suspicion of mild cirrhosis. 4. Too small to characterize segment 7 liver lesion does not have suspicious characteristics. If the patient is eventually diagnosed with  cirrhosis, this would be characterized as LR 3 and warrant follow-up MRI at 6 months. 5. Mild splenomegaly Electronically Signed   By: Jeronimo Greaves M.D.   On: 08/22/2022 08:09   CT CORONARY MORPH W/CTA COR W/SCORE W/CA W/CM &/OR WO/CM  Addendum Date: 08/21/2022   ADDENDUM REPORT: 08/21/2022 12:22 EXAM: OVER-READ INTERPRETATION  CT CHEST The following report is an over-read performed by radiologist Dr. Karle Barr [facility] on 08/21/2022. This over-read does not include interpretation of cardiac or coronary anatomy or pathology. The coronary CTA interpretation by the cardiologist is attached. COMPARISON:  08/19/2022 FINDINGS: Aorta normal caliber. No pericardial effusion. Pulmonary arteries patent. Esophagus unremarkable. Visualized upper abdomen remarkable only for tubing in the LEFT upper quadrant. Lungs clear. No acute osseous findings. IMPRESSION: No acute or significant extracardiac findings. Electronically Signed   By: Ulyses Southward M.D.   On: 08/21/2022 12:22   Result Date: 08/21/2022 CLINICAL DATA:  Chest pain EXAM: Cardiac/Coronary CTA TECHNIQUE: A non-contrast, gated CT scan was obtained with axial slices of 3 mm through the heart for calcium scoring. Calcium scoring was performed using the Agatston method. A 120 kV prospective, gated, contrast cardiac scan was obtained. Gantry rotation speed was 250 msecs and collimation was 0.6 mm. Two sublingual nitroglycerin tablets (0.8 mg) were given. The 3D data set was reconstructed in 5% intervals of the 35-75% of the R-R cycle. Diastolic phases were analyzed on a dedicated workstation using MPR, MIP, and VRT modes. The patient received 95 cc of contrast. FINDINGS: Image quality: Average.  Slab artifact noted. Noise artifact is: Limited. Coronary Arteries:  Normal coronary origin.  Left dominance. Left main: The left main is a large caliber vessel with a normal take off from the left coronary cusp that bifurcates to form a left anterior  descending artery and a left circumflex artery. There is no plaque or stenosis. Left anterior descending artery: The LAD is patent without evidence of plaque or stenosis. The LAD gives off 2 patent diagonal branches. Left circumflex artery: The LCX is dominant and patent with no evidence of plaque or stenosis. The LCX gives off 2 patent obtuse marginal branches. Right coronary artery: The RCA is non-dominant with normal take off from the right coronary cusp. There is no evidence of plaque or stenosis. The RCA terminates as a PDA and right posterolateral branch without evidence of plaque or stenosis. Right Atrium: Right atrial size is within normal limits. Right Ventricle: The right ventricular cavity is within normal limits. Left Atrium: Left atrial size is normal in size with no left atrial appendage filling defect. Small PFO. Left Ventricle: The ventricular cavity size is within normal limits. Pulmonary arteries: Normal in size without proximal filling defect. Pulmonary veins: Normal pulmonary venous drainage. Pericardium: Normal thickness without significant effusion or calcium present. Cardiac valves: The aortic valve is trileaflet without significant calcification. The mitral valve is normal without significant calcification. Aorta: Normal caliber without significant disease. Extra-cardiac findings: See attached radiology report for non-cardiac structures. IMPRESSION: 1. Coronary calcium score of 0. 2. Normal coronary origin with left dominance. 3. Normal coronary arteries. RECOMMENDATIONS: 1. No evidence of CAD (0%). Consider non-atherosclerotic causes of chest pain. Lennie Odor, MD Electronically Signed: By: Lennie Odor  M.D. On: 08/21/2022 10:42   ECHOCARDIOGRAM COMPLETE  Result Date: 08/20/2022    ECHOCARDIOGRAM REPORT   Patient Name:   Karina [name] Date of Exam: 08/20/2022 Medical Rec #:  [id number]              Height:       65.0 in Accession #:    [id number]             Weight:        301.8 lb Date of Birth:  September 12, 1980              BSA:          2.356 m Patient Age:    41 years               BP:           123/68 mmHg Patient Gender: F                      HR:           82 bpm. Exam Location:  Inpatient Procedure: 2D Echo, Cardiac Doppler, Color Doppler and Strain Analysis Indications:    Chest Pain R07.9  History:        Patient has prior history of Echocardiogram examinations, most                 recent 05/07/2017. Risk Factors:Sleep Apnea.  Sonographer:    Leta Jungling RDCS Referring Phys: [id number] VISHAL R Javoni Lucken IMPRESSIONS  1. Left ventricular ejection fraction, by estimation, is 60 to 65%. The left ventricle has normal function. The left ventricle has no regional wall motion abnormalities. Left ventricular diastolic parameters were normal. The average left ventricular global longitudinal strain is -24.9 %. The global longitudinal strain is normal.  2. Right ventricular systolic function is normal. The right ventricular size is normal.  3. The mitral valve is normal in structure. No evidence of mitral valve regurgitation. No evidence of mitral stenosis.  4. The aortic valve is tricuspid. Aortic valve regurgitation is not visualized. No aortic stenosis is present.  5. The inferior vena cava is normal in size with greater than 50% respiratory variability, suggesting right atrial pressure of 3 mmHg. Comparison(s): No significant change from prior study. Conclusion(s)/Recommendation(s): Normal biventricular function without evidence of hemodynamically significant valvular heart disease. FINDINGS  Left Ventricle: Left ventricular ejection fraction, by estimation, is 60 to 65%. The left ventricle has normal function. The left ventricle has no regional wall motion abnormalities. The average left ventricular global longitudinal strain is -24.9 %. The global longitudinal strain is normal. The left ventricular internal cavity size was normal in size. There is no left ventricular hypertrophy. Left  ventricular diastolic parameters were normal. Right Ventricle: The right ventricular size is normal. No increase in right ventricular wall thickness. Right ventricular systolic function is normal. Left Atrium: Left atrial size was normal in size. Right Atrium: Right atrial size was normal in size. Pericardium: There is no evidence of pericardial effusion. Mitral Valve: The mitral valve is normal in structure. No evidence of mitral valve regurgitation. No evidence of mitral valve stenosis. Tricuspid Valve: The tricuspid valve is grossly normal. Tricuspid valve regurgitation is not demonstrated. No evidence of tricuspid stenosis. Aortic Valve: The aortic valve is tricuspid. Aortic valve regurgitation is not visualized. No aortic stenosis is present. Pulmonic Valve: The pulmonic valve was not well visualized. Pulmonic valve regurgitation is not visualized. No evidence of pulmonic stenosis. Aorta: The  aortic root, ascending aorta, aortic arch and descending aorta are all structurally normal, with no evidence of dilitation or obstruction. Venous: The inferior vena cava is normal in size with greater than 50% respiratory variability, suggesting right atrial pressure of 3 mmHg. IAS/Shunts: No atrial level shunt detected by color flow Doppler.  LEFT VENTRICLE PLAX 2D LVIDd:         5.20 cm   Diastology LVIDs:         2.80 cm   LV e' medial:    12.80 cm/s LV PW:         0.70 cm   LV E/e' medial:  6.8 LV IVS:        0.70 cm   LV e' lateral:   14.10 cm/s LVOT diam:     2.20 cm   LV E/e' lateral: 6.2 LV SV:         93 LV SV Index:   39        2D Longitudinal Strain LVOT Area:     3.80 cm  2D Strain GLS (A2C):   -25.7 %                          2D Strain GLS (A3C):   -23.8 %                          2D Strain GLS (A4C):   -25.3 %                          2D Strain GLS Avg:     -24.9 % RIGHT VENTRICLE RV S prime:     14.80 cm/s TAPSE (M-mode): 2.8 cm LEFT ATRIUM             Index        RIGHT ATRIUM           Index LA diam:         4.20 cm 1.78 cm/m   RA Area:     14.20 cm LA Vol (A2C):   20.5 ml 8.70 ml/m   RA Volume:   33.40 ml  14.18 ml/m LA Vol (A4C):   38.1 ml 16.17 ml/m LA Biplane Vol: 28.1 ml 11.93 ml/m  AORTIC VALVE LVOT Vmax:   123.00 cm/s LVOT Vmean:  78.900 cm/s LVOT VTI:    0.244 m  AORTA Ao Root diam: 2.70 cm Ao Asc diam:  2.90 cm MITRAL VALVE MV Area (PHT): 4.60 cm    SHUNTS MV Decel Time: 165 msec    Systemic VTI:  0.24 m MV E velocity: 87.00 cm/s  Systemic Diam: 2.20 cm MV A velocity: 66.00 cm/s MV E/A ratio:  1.32 Jodelle RedBridgette Christopher MD Electronically signed by Jodelle RedBridgette Christopher MD Signature Date/Time: 08/20/2022/4:56:52 PM    Final    CT Angio Chest PE W and/or Wo Contrast  Result Date: 08/19/2022 CLINICAL DATA:  Pulmonary embolism (PE) suspected, low to intermediate prob, positive D-dimer EXAM: CT ANGIOGRAPHY CHEST WITH CONTRAST TECHNIQUE: Multidetector CT imaging of the chest was performed using the standard protocol during bolus administration of intravenous contrast. Multiplanar CT image reconstructions and MIPs were obtained to evaluate the vascular anatomy. RADIATION DOSE REDUCTION: This exam was performed according to the departmental dose-optimization program which includes automated exposure control, adjustment of the mA and/or kV according to patient size and/or use of iterative reconstruction technique. CONTRAST:  72 mL Omnipaque  350non-ionic IV contrast. COMPARISON:  07/06/2022 FINDINGS: Cardiovascular: Satisfactory opacification of the pulmonary arteries to the segmental level. No evidence of pulmonary embolism. Normal heart size. No pericardial effusion. Thoracic aorta unremarkable. Mediastinum/Nodes: No enlarged mediastinal, hilar, or axillary lymph nodes. Thyroid gland, trachea, and esophagus demonstrate no significant findings. Lungs/Pleura: Lungs are clear. No pleural effusion or pneumothorax. Upper Abdomen: No acute abnormality.  Lap band noted. Musculoskeletal: No chest wall  abnormality. No acute or significant osseous findings. There are thoracic degenerative changes. Review of the MIP images confirms the above findings. IMPRESSION: No evidence of PE, aneurysm or dissection. Electronically Signed   By: Layla Maw M.D.   On: 08/19/2022 17:54   DG Chest 2 View  Result Date: 08/19/2022 CLINICAL DATA:  Chest pain and weakness EXAM: CHEST - 2 VIEW COMPARISON:  Chest radiograph dated 07/06/2022 FINDINGS: Normal lung volumes. No focal consolidations. No pleural effusion or pneumothorax. The heart size and mediastinal contours are within normal limits. The visualized skeletal structures are unremarkable. IMPRESSION: No active cardiopulmonary disease. Electronically Signed   By: Agustin Cree M.D.   On: 08/19/2022 15:22    Microbiology: Results for orders placed or performed during the hospital encounter of 08/19/22  Resp panel by RT-PCR (RSV, Flu A&B, Covid)     Status: None   Collection Time: 08/19/22  3:57 PM   Specimen: Nasal Swab  Result Value Ref Range Status   SARS Coronavirus 2 by RT PCR NEGATIVE NEGATIVE Final    Comment: (NOTE) SARS-CoV-2 target nucleic acids are NOT DETECTED.  The SARS-CoV-2 RNA is generally detectable in upper respiratory specimens during the acute phase of infection. The lowest concentration of SARS-CoV-2 viral copies this assay can detect is 138 copies/mL. A negative result does not preclude SARS-Cov-2 infection and should not be used as the sole basis for treatment or other patient management decisions. A negative result may occur with  improper specimen collection/handling, submission of specimen other than nasopharyngeal swab, presence of viral mutation(s) within the areas targeted by this assay, and inadequate number of viral copies(<138 copies/mL). A negative result must be combined with clinical observations, patient history, and epidemiological information. The expected result is Negative.  Fact Sheet for Patients:   BloggerCourse.com  Fact Sheet for Healthcare Providers:  SeriousBroker.it  This test is no t yet approved or cleared by the [place] FDA and  has been authorized for detection and/or diagnosis of SARS-CoV-2 by FDA under an Emergency Use Authorization (EUA). This EUA will remain  in effect (meaning this test can be used) for the duration of the COVID-19 declaration under Section 564(b)(1) of the Act, 21 U.S.C.section 360bbb-3(b)(1), unless the authorization is terminated  or revoked sooner.       Influenza A by PCR NEGATIVE NEGATIVE Final   Influenza B by PCR NEGATIVE NEGATIVE Final    Comment: (NOTE) The Xpert Xpress SARS-CoV-2/FLU/RSV plus assay is intended as an aid in the diagnosis of influenza from Nasopharyngeal swab specimens and should not be used as a sole basis for treatment. Nasal washings and aspirates are unacceptable for Xpert Xpress SARS-CoV-2/FLU/RSV testing.  Fact Sheet for Patients: BloggerCourse.com  Fact Sheet for Healthcare Providers: SeriousBroker.it  This test is not yet approved or cleared by the [place] FDA and has been authorized for detection and/or diagnosis of SARS-CoV-2 by FDA under an Emergency Use Authorization (EUA). This EUA will remain in effect (meaning this test can be used) for the duration of the COVID-19 declaration under Section 564(b)(1) of the Act,  21 U.S.C. section 360bbb-3(b)(1), unless the authorization is terminated or revoked.     Resp Syncytial Virus by PCR NEGATIVE NEGATIVE Final    Comment: (NOTE) Fact Sheet for Patients: BloggerCourse.com  Fact Sheet for Healthcare Providers: SeriousBroker.it  This test is not yet approved or cleared by the [place] FDA and has been authorized for detection and/or diagnosis of SARS-CoV-2 by FDA under an Emergency Use  Authorization (EUA). This EUA will remain in effect (meaning this test can be used) for the duration of the COVID-19 declaration under Section 564(b)(1) of the Act, 21 U.S.C. section 360bbb-3(b)(1), unless the authorization is terminated or revoked.  Performed at [facility], [address], [place], [place] [address]   Urine Culture     Status: Abnormal   Collection Time: 08/20/22 11:39 AM   Specimen: Urine, Clean Catch  Result Value Ref Range Status   Specimen Description   Final    URINE, CLEAN CATCH Performed at [facility], [address] [place], [place] [address]    Special Requests   Final    NONE Performed at [facility], [address], [place], [place] [address]    Culture MULTIPLE SPECIES PRESENT, SUGGEST RECOLLECTION (A)  Final   Report Status 08/21/2022 FINAL  Final   Labs: CBC: Recent Labs  Lab 08/22/22 0435  WBC 10.4  NEUTROABS 5.5  HGB 9.8*  HCT 30.2*  MCV 68.8*  PLT 312   Basic Metabolic Panel: Recent Labs  Lab 08/22/22 0435 08/23/22 0437  NA 135 134*  K 3.8 4.4  CL 106 109  CO2 24 21*  GLUCOSE 83 142*  BUN 11 13  CREATININE 0.87 0.73  CALCIUM 8.3* 8.5*  MG 2.2 2.2   Liver Function Tests: Recent Labs  Lab 08/22/22 0435 08/23/22 0437  AST 107* 90*  ALT 135* 127*  ALKPHOS 285* 292*  BILITOT 1.8* 1.2  PROT 7.8 8.3*  ALBUMIN 2.5* 2.6*   CBG: No results for input(s): "GLUCAP" in the last 168 hours.  Discharge time spent: greater than 30 minutes.  Signed: Lynden Oxford, MD [facility] 08/23/2022

## 2022-08-28 NOTE — Anesthesia Preprocedure Evaluation (Signed)
Anesthesia Evaluation  Patient identified by MRN, date of birth, ID band Patient awake    Reviewed: Allergy & Precautions, NPO status , Patient's Chart, lab work & pertinent test results  History of Anesthesia Complications (+) history of anesthetic complications (needed to be reminded to breath in PACU last time per pt)  Airway Mallampati: I  TM Distance: >3 FB Neck ROM: Full    Dental  (+) Teeth Intact, Dental Advisory Given   Pulmonary asthma , sleep apnea , former smoker Quit smoking 1997 Stopped wearing CPAP several years ago (about 10 years) Asthma well controlled, only uses inhaler with URIs- used this morning prevntatively   Pulmonary exam normal breath sounds clear to auscultation       Cardiovascular negative cardio ROS Normal cardiovascular exam Rhythm:Regular Rate:Normal     Neuro/Psych  PSYCHIATRIC DISORDERS Anxiety Depression    negative neurological ROS     GI/Hepatic ,GERD  Medicated and Controlled,,S/p ERCP 08/22/22 S/p lap band   Endo/Other  diabetes (prediabetic)  Morbid obesityBMI 51  Renal/GU negative Renal ROS  negative genitourinary   Musculoskeletal negative musculoskeletal ROS (+)    Abdominal  (+) + obese  Peds  Hematology  (+) Blood dyscrasia, anemia Hb 9.8   Anesthesia Other Findings   Reproductive/Obstetrics negative OB ROS                             Anesthesia Physical Anesthesia Plan  ASA: 3  Anesthesia Plan: General   Post-op Pain Management:    Induction: Intravenous  PONV Risk Score and Plan: 3 and Ondansetron, Dexamethasone, Treatment may vary due to age or medical condition and Midazolam  Airway Management Planned: Oral ETT  Additional Equipment: None  Intra-op Plan:   Post-operative Plan: Extubation in OR  Informed Consent: I have reviewed the patients History and Physical, chart, labs and discussed the procedure including the  risks, benefits and alternatives for the proposed anesthesia with the patient or authorized representative who has indicated his/her understanding and acceptance.     Dental advisory given  Plan Discussed with: CRNA  Anesthesia Plan Comments: (Last airway for ERCP 08/22/22: Ventilation: Mask ventilation without difficulty Laryngoscope Size: Mac and 4 Grade View: Grade I Tube type: Oral Tube size: 7.0 mm Number of attempts: 1 )        Anesthesia Quick Evaluation

## 2022-08-29 ENCOUNTER — Ambulatory Visit (HOSPITAL_COMMUNITY): Payer: 59 | Admitting: Anesthesiology

## 2022-08-29 ENCOUNTER — Ambulatory Visit (HOSPITAL_COMMUNITY): Payer: 59

## 2022-08-29 ENCOUNTER — Encounter (HOSPITAL_COMMUNITY): Payer: Self-pay | Admitting: Gastroenterology

## 2022-08-29 ENCOUNTER — Ambulatory Visit (HOSPITAL_BASED_OUTPATIENT_CLINIC_OR_DEPARTMENT_OTHER): Payer: 59 | Admitting: Anesthesiology

## 2022-08-29 ENCOUNTER — Other Ambulatory Visit: Payer: Self-pay

## 2022-08-29 ENCOUNTER — Encounter (HOSPITAL_COMMUNITY)
Admission: RE | Disposition: A | Discharge status: Home / Self Care | Payer: Self-pay | Source: Home / Self Care | Attending: Gastroenterology

## 2022-08-29 ENCOUNTER — Ambulatory Visit (HOSPITAL_COMMUNITY)
Admission: RE | Admit: 2022-08-29 | Discharge: 2022-08-29 | Disposition: A | Discharge status: Home / Self Care | Payer: 59 | Attending: Gastroenterology | Admitting: Gastroenterology

## 2022-08-29 DIAGNOSIS — K8050 Calculus of bile duct without cholangitis or cholecystitis without obstruction: Secondary | ICD-10-CM | POA: Diagnosis not present

## 2022-08-29 DIAGNOSIS — Z87891 Personal history of nicotine dependence: Secondary | ICD-10-CM | POA: Diagnosis not present

## 2022-08-29 DIAGNOSIS — R7303 Prediabetes: Secondary | ICD-10-CM | POA: Diagnosis not present

## 2022-08-29 DIAGNOSIS — G4730 Sleep apnea, unspecified: Secondary | ICD-10-CM | POA: Insufficient documentation

## 2022-08-29 DIAGNOSIS — J45909 Unspecified asthma, uncomplicated: Secondary | ICD-10-CM | POA: Insufficient documentation

## 2022-08-29 DIAGNOSIS — R932 Abnormal findings on diagnostic imaging of liver and biliary tract: Secondary | ICD-10-CM | POA: Diagnosis not present

## 2022-08-29 DIAGNOSIS — Z4659 Encounter for fitting and adjustment of other gastrointestinal appliance and device: Secondary | ICD-10-CM | POA: Diagnosis not present

## 2022-08-29 DIAGNOSIS — Z9884 Bariatric surgery status: Secondary | ICD-10-CM | POA: Diagnosis not present

## 2022-08-29 DIAGNOSIS — Z6841 Body Mass Index (BMI) 40.0 and over, adult: Secondary | ICD-10-CM | POA: Insufficient documentation

## 2022-08-29 DIAGNOSIS — F418 Other specified anxiety disorders: Secondary | ICD-10-CM | POA: Diagnosis not present

## 2022-08-29 HISTORY — PX: SPYGLASS CHOLANGIOSCOPY: SHX5441

## 2022-08-29 HISTORY — PX: BILIARY STENT PLACEMENT: SHX5538

## 2022-08-29 HISTORY — PX: ENDOSCOPIC RETROGRADE CHOLANGIOPANCREATOGRAPHY (ERCP) WITH PROPOFOL: SHX5810

## 2022-08-29 HISTORY — PX: REMOVAL OF STONES: SHX5545

## 2022-08-29 HISTORY — PX: SPYGLASS LITHOTRIPSY: SHX5537

## 2022-08-29 HISTORY — PX: STONE EXTRACTION WITH BASKET: SHX5318

## 2022-08-29 LAB — PREGNANCY, URINE: Preg Test, Ur: NEGATIVE

## 2022-08-29 SURGERY — ENDOSCOPIC RETROGRADE CHOLANGIOPANCREATOGRAPHY (ERCP) WITH PROPOFOL
Anesthesia: General

## 2022-08-29 MED ORDER — MIDAZOLAM HCL 2 MG/2ML IJ SOLN
INTRAMUSCULAR | Status: AC
  Filled 2022-08-29: qty 2

## 2022-08-29 MED ORDER — SODIUM CHLORIDE 0.9 % IV SOLN: INTRAVENOUS | Status: DC

## 2022-08-29 MED ORDER — DICLOFENAC SUPPOSITORY 100 MG
RECTAL | Status: DC | PRN
  Administered 2022-08-29: 100 mg via RECTAL

## 2022-08-29 MED ORDER — DEXAMETHASONE SODIUM PHOSPHATE 10 MG/ML IJ SOLN
INTRAMUSCULAR | Status: DC | PRN
  Administered 2022-08-29: 5 mg via INTRAVENOUS

## 2022-08-29 MED ORDER — SODIUM CHLORIDE 0.9 % IV SOLN
INTRAVENOUS | Status: DC | PRN
  Administered 2022-08-29: 80 mL

## 2022-08-29 MED ORDER — SUGAMMADEX SODIUM 500 MG/5ML IV SOLN
INTRAVENOUS | Status: DC | PRN
  Administered 2022-08-29: 300 mg via INTRAVENOUS

## 2022-08-29 MED ORDER — AMISULPRIDE (ANTIEMETIC) 5 MG/2ML IV SOLN
10.0000 mg | Freq: Once | INTRAVENOUS | Status: AC
  Filled 2022-08-29: qty 4

## 2022-08-29 MED ORDER — ROCURONIUM BROMIDE 10 MG/ML (PF) SYRINGE
PREFILLED_SYRINGE | INTRAVENOUS | Status: DC | PRN
  Administered 2022-08-29: 100 mg via INTRAVENOUS

## 2022-08-29 MED ORDER — GLUCAGON HCL RDNA (DIAGNOSTIC) 1 MG IJ SOLR
INTRAMUSCULAR | Status: AC
  Filled 2022-08-29: qty 1

## 2022-08-29 MED ORDER — FENTANYL CITRATE (PF) 100 MCG/2ML IJ SOLN
INTRAMUSCULAR | Status: DC | PRN
  Administered 2022-08-29: 100 ug via INTRAVENOUS

## 2022-08-29 MED ORDER — AMISULPRIDE (ANTIEMETIC) 5 MG/2ML IV SOLN
INTRAVENOUS | Status: AC
  Filled 2022-08-29: qty 2

## 2022-08-29 MED ORDER — LACTATED RINGERS IV SOLN
INTRAVENOUS | Status: AC | PRN
  Administered 2022-08-29: 10 mL/h via INTRAVENOUS

## 2022-08-29 MED ORDER — DICLOFENAC SUPPOSITORY 100 MG
RECTAL | Status: AC
  Filled 2022-08-29: qty 1

## 2022-08-29 MED ORDER — INDOMETHACIN 50 MG RE SUPP
RECTAL | Status: AC
  Filled 2022-08-29: qty 2

## 2022-08-29 MED ORDER — ONDANSETRON HCL 4 MG/2ML IJ SOLN
INTRAMUSCULAR | Status: DC | PRN
  Administered 2022-08-29: 4 mg via INTRAVENOUS

## 2022-08-29 MED ORDER — PROPOFOL 10 MG/ML IV BOLUS
INTRAVENOUS | Status: AC
  Filled 2022-08-29: qty 20

## 2022-08-29 MED ORDER — LIDOCAINE 2% (20 MG/ML) 5 ML SYRINGE
INTRAMUSCULAR | Status: DC | PRN
  Administered 2022-08-29: 100 mg via INTRAVENOUS

## 2022-08-29 MED ORDER — CIPROFLOXACIN IN D5W 400 MG/200ML IV SOLN
INTRAVENOUS | Status: AC
  Filled 2022-08-29: qty 200

## 2022-08-29 MED ORDER — FENTANYL CITRATE (PF) 100 MCG/2ML IJ SOLN
INTRAMUSCULAR | Status: AC
  Filled 2022-08-29: qty 2

## 2022-08-29 MED ORDER — AMISULPRIDE (ANTIEMETIC) 5 MG/2ML IV SOLN
INTRAVENOUS | Status: AC
  Administered 2022-08-29: 10 mg via INTRAVENOUS
  Filled 2022-08-29: qty 2

## 2022-08-29 MED ORDER — MIDAZOLAM HCL 5 MG/5ML IJ SOLN
INTRAMUSCULAR | Status: DC | PRN
  Administered 2022-08-29: 2 mg via INTRAVENOUS

## 2022-08-29 MED ORDER — PROPOFOL 10 MG/ML IV BOLUS
INTRAVENOUS | Status: DC | PRN
  Administered 2022-08-29: 200 mg via INTRAVENOUS

## 2022-08-29 NOTE — Op Note (Signed)
[facility] Patient Name: Karina [name] Procedure Date: 08/29/2022 MRN: [id number] Attending MD: Carol Ada , MD, [id number] Date of Birth: 06-12-1981 CSN: [id number] Age: 41 Admit Type: Outpatient Procedure:                ERCP Indications:              Common bile duct stone(s) Providers:                Carol Ada, MD, Fanny Skates RN, RN, Luan Moore, Technician Referring MD:              Medicines:                General Anesthesia Complications:            No immediate complications. Estimated Blood Loss:     Estimated blood loss: none. Procedure:                Pre-Anesthesia Assessment:                           - Prior to the procedure, a History and Physical                            was performed, and patient medications and                            allergies were reviewed. The patient's tolerance of                            previous anesthesia was also reviewed. The risks                            and benefits of the procedure and the sedation                            options and risks were discussed with the patient.                            All questions were answered, and informed consent                            was obtained. Prior Anticoagulants: The patient has                            taken no anticoagulant or antiplatelet agents. ASA                            Grade Assessment: III - A patient with severe                            systemic disease. After reviewing the risks and  benefits, the patient was deemed in satisfactory                            condition to undergo the procedure.                           - Sedation was administered by an anesthesia                            professional. General anesthesia was attained.                           After obtaining informed consent, the scope was                            passed under direct vision.  Throughout the                            procedure, the patient's blood pressure, pulse, and                            oxygen saturations were monitored continuously. The                            [id number]) Olympus duodenoscope was                            introduced through the mouth, and used to inject                            contrast into and used for direct visualization of                            the bile duct. The ERCP was performed with                            difficulty. The patient tolerated the procedure                            well. Scope In: Scope Out: Findings:      A biliary sphincterotomy had been performed. The sphincterotomy appeared       open. The bile duct was deeply cannulated with the short-nosed traction       sphincterotome. Contrast was injected. I personally interpreted the bile       duct images. There was brisk flow of contrast through the ducts. Image       quality was excellent. Contrast extended to the hepatic ducts. The       common bile duct contained filling defect(s) thought to be a stone. A       short 0.035 inch Soft Jagwire was passed into the biliary tree.       Electrohydraulic lithotripsy was successful. The biliary tree was swept       with a 12 mm balloon starting at the left main hepatic duct. All stones       were removed. One  7 Fr by 9 cm plastic stent with a single external flap       and a single internal flap was placed 8 cm into the common bile duct.       Bile flowed through the stent. The stent was in good position.      The ampulla was patent s/p sphincterotomy last week. There was no       evidence of any bleeding from this site. The CBD was cannulated with       ease and the guidewire was advanced, but stopped around the site of the       cholecystectomy clips. Contrast injection revealed a filling defect in       the proximal CBD. There was also a stricture at the level of the       cholecystectomy clips  and proximal to this area the left hepatic duct       was dilated up to 12-14 mm. Several balloon sweeps were performed of the       proximal CBD stone, but it was not able to be moved. A wire basket was       employed, but it failed to capture the stone. Spyglass was then used to       further elucidate the situation and to perform a lithotripsy.       Advancement of the Spyglass to the proximal CBD was negative for any       stone. The Spyglass was further advanced and there was evidence of       mucosal friability at the level of the cholcystectomy clips. Following       the ducts proximally lead to the right intrahepatic ducts and a right       intrahepatic stone was encountered. Lithotripsy was performed in a slow       and methodical manner to avoid injury of the biliary epithelium. The       stone was successfully fragmented and it appeared to be blind end in       this region. This area was not the site of the left intrahepatic duct       dilation. The Spyglass was slowly withdrawn and there was no evidence of       any main duct stones or CBD stones. Recannulation of the biliary tract       with the spinchtertome was performed and the guidewire was able to be       advanced into the dilated left intrahepatic duct. Contrast injection was       performed and there was no evidence of any filling defects. Further       opacification of the distal biliary tract did not show any filling       defects and the prior stenosis at the cholecystectomy site did not       appear as narrowed. The duct was swept from the proximal left       intrahepatic duct to remove any stone fragments. Outside of some       airbubbles, the occlusion cholangiogram did not show any retained       stones. Because of the difficulty of the case, the proximal biliary       stenosis, and the finding of intrahepatic stones, a 7 Fr x 9 cm stent       was successfully placed. The proximal portion of the stent was above  the       stenosis. Impression:               -  Prior biliary sphincterotomy appeared open.                           - A filling defect consistent with a stone was seen                            on the cholangiogram.                           - Choledocholithiasis was found. Complete removal                            was accomplished by balloon extraction.                           - Lithotripsy was successful.                           - The biliary tree was swept.                           - One plastic stent was placed into the common bile                            duct. Moderate Sedation:      Not Applicable - Patient had care per Anesthesia. Recommendation:           - Patient has a contact number available for                            emergencies. The signs and symptoms of potential                            delayed complications were discussed with the                            patient. Return to normal activities tomorrow.                            Written discharge instructions were provided to the                            patient.                           - Repeat ERCP in 3 months for stent removal and                            possible repeat Spyglass interrogation.                           - Follow up in the office in 2 weeks for blood work                            and update of her clinical status. Procedure Code(s):        ---  Professional ---                           [phone], Endoscopic retrograde                            cholangiopancreatography (ERCP); with placement of                            endoscopic stent into biliary or pancreatic duct,                            including pre- and post-dilation and guide wire                            passage, when performed, including sphincterotomy,                            when performed, each stent                           [id number], Endoscopic retrograde                             cholangiopancreatography (ERCP); with destruction                            of calculi, any method (eg, mechanical,                            electrohydraulic, lithotripsy)                           [id number], Endoscopic catheterization of the biliary                            ductal system, radiological supervision and                            interpretation Diagnosis Code(s):        --- Professional ---                           K80.50, Calculus of bile duct without cholangitis                            or cholecystitis without obstruction                           R93.2, Abnormal findings on diagnostic imaging of                            liver and biliary tract CPT copyright 2022 American Medical Association. All rights reserved. The codes documented in this report are preliminary and upon coder review may  be revised to meet current compliance requirements. Jeani Hawking, MD Jeani Hawking, MD 08/29/2022 12:38:21 PM This report has been signed electronically. Number of Addenda: 0

## 2022-08-29 NOTE — Discharge Instructions (Signed)

## 2022-08-29 NOTE — Interval H&P Note (Signed)
History and Physical Interval Note:  08/29/2022 10:47 AM  Karina [name]  has presented today for surgery, with the diagnosis of stent placement.  The various methods of treatment have been discussed with the patient and family. After consideration of risks, benefits and other options for treatment, the patient has consented to  Procedure(s): ENDOSCOPIC RETROGRADE CHOLANGIOPANCREATOGRAPHY (ERCP) WITH PROPOFOL (N/A) BILIARY STENT PLACEMENT (N/A) as a surgical intervention.  The patient's history has been reviewed, patient examined, no change in status, stable for surgery.  I have reviewed the patient's chart and labs.  Questions were answered to the patient's satisfaction.     Celestine Prim D

## 2022-08-29 NOTE — Anesthesia Procedure Notes (Signed)
Procedure Name: Intubation Date/Time: 08/29/2022 10:55 AM  Performed by: Lind Covert, CRNAPre-anesthesia Checklist: Patient identified, Emergency Drugs available, Suction available and Patient being monitored Patient Re-evaluated:Patient Re-evaluated prior to induction Oxygen Delivery Method: Circle system utilized Preoxygenation: Pre-oxygenation with 100% oxygen Induction Type: IV induction Ventilation: Mask ventilation without difficulty Laryngoscope Size: Mac and 4 Grade View: Grade I Tube type: Oral Tube size: 7.0 mm Number of attempts: 1 Airway Equipment and Method: Stylet Placement Confirmation: ETT inserted through vocal cords under direct vision, positive ETCO2 and breath sounds checked- equal and bilateral Secured at: 22 cm Tube secured with: Tape Dental Injury: Teeth and Oropharynx as per pre-operative assessment

## 2022-08-29 NOTE — Anesthesia Postprocedure Evaluation (Signed)
Anesthesia Post Note  Patient: Karina [name]  Procedure(s) Performed: ENDOSCOPIC RETROGRADE CHOLANGIOPANCREATOGRAPHY (ERCP) WITH PROPOFOL BILIARY STENT PLACEMENT SPYGLASS LITHOTRIPSY SPYGLASS CHOLANGIOSCOPY REMOVAL OF STONES STONE EXTRACTION WITH BASKET     Patient location during evaluation: PACU Anesthesia Type: General Level of consciousness: awake and alert, oriented and patient cooperative Pain management: pain level controlled Vital Signs Assessment: post-procedure vital signs reviewed and stable Respiratory status: spontaneous breathing, nonlabored ventilation and respiratory function stable Cardiovascular status: blood pressure returned to baseline and stable Postop Assessment: no apparent nausea or vomiting Anesthetic complications: no   No notable events documented.  Last Vitals:  Vitals:   08/29/22 1240 08/29/22 1250  BP: 128/70 135/60  Pulse: 79 67  Resp: 11 11  Temp:    SpO2: 100% 100%    Last Pain:  Vitals:   08/29/22 1240  TempSrc:   PainSc: 0-No pain                 Lannie Fields

## 2022-08-29 NOTE — Transfer of Care (Signed)
Immediate Anesthesia Transfer of Care Note  Patient: Karina [name]  Procedure(s) Performed: ENDOSCOPIC RETROGRADE CHOLANGIOPANCREATOGRAPHY (ERCP) WITH PROPOFOL BILIARY STENT PLACEMENT SPYGLASS LITHOTRIPSY SPYGLASS CHOLANGIOSCOPY REMOVAL OF STONES STONE EXTRACTION WITH BASKET  Patient Location: PACU  Anesthesia Type:General  Level of Consciousness: sedated  Airway & Oxygen Therapy: Patient Spontanous Breathing and Patient connected to face mask oxygen  Post-op Assessment: Report given to RN and Post -op Vital signs reviewed and stable  Post vital signs: Reviewed and stable  Last Vitals:  Vitals Value Taken Time  BP    Temp    Pulse 92 08/29/22 1230  Resp 15 08/29/22 1230  SpO2 100 % 08/29/22 1230  Vitals shown include unvalidated device data.  Last Pain:  Vitals:   08/29/22 1024  TempSrc: Temporal  PainSc: 0-No pain         Complications: No notable events documented.

## 2022-08-30 DIAGNOSIS — Z09 Encounter for follow-up examination after completed treatment for conditions other than malignant neoplasm: Secondary | ICD-10-CM | POA: Diagnosis not present

## 2022-08-30 DIAGNOSIS — K8050 Calculus of bile duct without cholangitis or cholecystitis without obstruction: Secondary | ICD-10-CM | POA: Diagnosis not present

## 2022-09-03 ENCOUNTER — Encounter (HOSPITAL_COMMUNITY): Payer: Self-pay | Admitting: Gastroenterology

## 2022-09-05 ENCOUNTER — Other Ambulatory Visit: Payer: Self-pay

## 2022-09-05 ENCOUNTER — Other Ambulatory Visit (HOSPITAL_COMMUNITY): Payer: Self-pay

## 2022-09-05 DIAGNOSIS — K8050 Calculus of bile duct without cholangitis or cholecystitis without obstruction: Secondary | ICD-10-CM | POA: Diagnosis not present

## 2022-09-13 ENCOUNTER — Other Ambulatory Visit (HOSPITAL_COMMUNITY): Payer: Self-pay

## 2022-09-17 DIAGNOSIS — K760 Fatty (change of) liver, not elsewhere classified: Secondary | ICD-10-CM | POA: Diagnosis not present

## 2022-09-17 DIAGNOSIS — K8050 Calculus of bile duct without cholangitis or cholecystitis without obstruction: Secondary | ICD-10-CM | POA: Diagnosis not present

## 2022-09-17 DIAGNOSIS — R932 Abnormal findings on diagnostic imaging of liver and biliary tract: Secondary | ICD-10-CM | POA: Diagnosis not present

## 2022-09-18 ENCOUNTER — Other Ambulatory Visit (HOSPITAL_COMMUNITY): Payer: Self-pay | Admitting: Gastroenterology

## 2022-09-18 DIAGNOSIS — K760 Fatty (change of) liver, not elsewhere classified: Secondary | ICD-10-CM

## 2022-09-21 ENCOUNTER — Other Ambulatory Visit (HOSPITAL_COMMUNITY): Payer: Self-pay

## 2022-09-21 DIAGNOSIS — Z6841 Body Mass Index (BMI) 40.0 and over, adult: Secondary | ICD-10-CM | POA: Diagnosis not present

## 2022-09-21 DIAGNOSIS — E559 Vitamin D deficiency, unspecified: Secondary | ICD-10-CM | POA: Diagnosis not present

## 2022-09-21 MED ORDER — WEGOVY 0.25 MG/0.5ML ~~LOC~~ SOAJ
0.2500 mg | SUBCUTANEOUS | 0 refills | Status: DC
  Filled 2022-09-21 – 2023-02-06 (×4): qty 2, 28d supply, fill #0

## 2022-09-21 MED ORDER — CHOLECALCIFEROL 1.25 MG (50000 UT) PO CAPS
50000.0000 [IU] | ORAL_CAPSULE | ORAL | 1 refills | Status: AC
  Filled 2022-09-21: qty 12, 84d supply, fill #0
  Filled 2022-12-18: qty 12, 84d supply, fill #1

## 2022-09-27 ENCOUNTER — Other Ambulatory Visit (HOSPITAL_COMMUNITY): Payer: Self-pay

## 2022-09-27 MED ORDER — SAXENDA 18 MG/3ML ~~LOC~~ SOPN
PEN_INJECTOR | SUBCUTANEOUS | 1 refills | Status: DC
  Filled 2022-09-27: qty 15, 90d supply, fill #0
  Filled 2022-10-03: qty 15, 30d supply, fill #0
  Filled 2022-10-05: qty 3, 30d supply, fill #0
  Filled 2022-11-02: qty 6, 30d supply, fill #1
  Filled 2022-11-29: qty 6, 30d supply, fill #2
  Filled 2023-01-03: qty 9, 30d supply, fill #2
  Filled 2023-01-29 – 2023-03-13 (×5): qty 9, 30d supply, fill #3

## 2022-09-28 ENCOUNTER — Other Ambulatory Visit (HOSPITAL_COMMUNITY): Payer: Self-pay

## 2022-10-02 ENCOUNTER — Ambulatory Visit: Payer: Commercial Managed Care - PPO | Attending: Cardiovascular Disease | Admitting: Cardiovascular Disease

## 2022-10-02 ENCOUNTER — Encounter: Payer: Self-pay | Admitting: Cardiovascular Disease

## 2022-10-02 ENCOUNTER — Other Ambulatory Visit (HOSPITAL_COMMUNITY): Payer: Self-pay

## 2022-10-02 VITALS — BP 128/70 | HR 81 | Ht 64.5 in | Wt 298.0 lb

## 2022-10-02 DIAGNOSIS — D582 Other hemoglobinopathies: Secondary | ICD-10-CM | POA: Diagnosis not present

## 2022-10-02 DIAGNOSIS — K8050 Calculus of bile duct without cholangitis or cholecystitis without obstruction: Secondary | ICD-10-CM

## 2022-10-02 DIAGNOSIS — G4733 Obstructive sleep apnea (adult) (pediatric): Secondary | ICD-10-CM | POA: Diagnosis not present

## 2022-10-02 DIAGNOSIS — D509 Iron deficiency anemia, unspecified: Secondary | ICD-10-CM | POA: Diagnosis not present

## 2022-10-02 DIAGNOSIS — R079 Chest pain, unspecified: Secondary | ICD-10-CM | POA: Diagnosis not present

## 2022-10-02 NOTE — Patient Instructions (Signed)
  Follow-Up: At [facility], you and your health needs are our priority.  As part of our continuing mission to provide you with exceptional heart care, we have created designated Provider Care Teams.  These Care Teams include your primary Cardiologist (physician) and Advanced Practice Providers (APPs -  Physician Assistants and Nurse Practitioners) who all work together to provide you with the care you need, when you need it.  We recommend signing up for the patient portal called "MyChart".  Sign up information is provided on this After Visit Summary.  MyChart is used to connect with patients for Virtual Visits (Telemedicine).  Patients are able to view lab/test results, encounter notes, upcoming appointments, etc.  Non-urgent messages can be sent to your provider as well.   To learn more about what you can do with MyChart, go to [email or web address].    Your next appointment:   4 month(s)  Provider:   Shelva Majestic, MD

## 2022-10-02 NOTE — Progress Notes (Signed)
Cardiology Office Note    Date:  10/07/2022   ID:  Karina [name], [place] 1981-05-16, MRN [id number]  PCP:  Bailey Mech, PA-C  Cardiologist:  Nicki Guadalajara, MD   6 week F/U cardiology evaluation initially  referred by Karina Guy, PA-C for evaluation of chest pain.   History of Present Illness:  Karina [name] is a 42 y.o. female who is followed by [name], PA-C at [facility] in [place].  Patient has experienced episodic sharp chest pain in July 06, 2022 woke up with chest pain with left arm radiation.  She went to the emergency room and in the triage evaluation note the pain was sharp epigastric rating to her back with posterior neck and left arm pain.  Symptoms began at 22:00 and was unassociated with nausea or vomiting.  She has remote history of lap band surgery, cholecystectomy right cell appendectomy.  Apparently, she left before being seen by the physician.  She underwent CT angiography of her chest abdomen and pelvis which did not reveal any evidence for aortic dissection or aneurysm.  There were no acute findings.  There were morphologic features of the liver suggestive of possible early cirrhosis.  A chest x-ray did not show active cardiopulmonary disease.  Troponins were negative.  White blood count was increased at 13.8.  MCV was low at 69.3 with a hemoglobin of 10.8 and hematocrit of 33.  There was mild transaminase elevation with AST 54 ALT 44 and alkaline phosphatase 231.  Potassium was 3.6.  Ultimately due to the long wait, she left AMA.  She admits significant weight gain of approximately 50 pounds over the last 3 years since her Lap-Band surgery.  When seen by call Podrazza it was recommended that she have a nuclear stress test.  She also has a remote history of obstructive sleep apnea which was originally diagnosed in [place].  She was on CPAP remotely but has not been on therapy for at least 5 years.  She  recently evaluated by her primary provider and is referred for cardiology consultation.  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  She typically works from 6 PM to 6 AM on Monday Tuesday and Wednesday and sleeps from 9 until 2 until she has to get her children.  She admits to fatigability.  During that evaluation, I recommended she undergo a 2D echo Doppler study.  Although her primary physician had recommended she undergo a nuclear stress test with her large body habitus and high likelihood for breast attenuation artifact I recommended she undergo coronary CTA for more definitive evaluation.  I also recommended follow-up laboratory in with her microcytic anemia she may ultimately require iron studies or evaluation for hemoglobinopathy.  Also recommended she undergo a split-night study for evaluation of sleep apnea with her super morbid obesity.  She was hospitalized from December 10 through December 14 and was found to have choledocholithiasis, transaminitis and underwent ERCP on December 13.  She was found to have common bile duct stones and a stent was placed and stones were removed.  CT angio of her chest was negative for PE, aneurysm or dissection.  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 A 2D echo Doppler study  on August 19, 2022  showed an EF of 60 to 65%.  She had normal strain.  There was no significant valvular abnormalities.  Coronary CTA on August 21, 2022 showed a calcium score of 0.  She had normal coronary origin with left dominance.  There was a suggestion of a possible small PFO.  Chest CT over read did not show any acute or significant extracardiac findings.    Presently, she has felt well.  She has not yet had her split-night sleep study.  Her abdominal pain has improved.  She is to be followed by Dr. Elnoria Howard in  GI who will remove her stents in approximately 2 months.  Presently she is on Emgality for weight loss and also has been taking Saxenda.  She presents for follow-up cardiology evaluation.  Past Medical History:  Diagnosis Date   Anemia    takes iron daily   Anxiety    no meds   Asthma    albuterol as needed   Chronic bronchitis    bronchitis x 1 event in her twenties   Complication of anesthesia    decreased respirations with lap band sx   Dysrhythmia     " arrhythmia during endoscopy procedure "    Edema extremities    hx of edema in lower ext.used to take lasix - none since twenties   Gallbladder disease    GERD (gastroesophageal reflux disease)    nexium    Hemoglobin C trait (HCC)    History of chicken pox    HSV (herpes simplex virus) anogenital infection    Neuropathy    feet   Pre-diabetes    Pregnant    svd 06/19/17   Sleep apnea    Does not use cpap nightly   SVD (spontaneous vaginal delivery)    x 2   Wears glasses     Past Surgical History:  Procedure Laterality Date   BILIARY STENT PLACEMENT N/A 08/29/2022   Procedure: BILIARY STENT PLACEMENT;  Surgeon: Jeani Hawking, MD;  Location: [facility];  Service: Gastroenterology;  Laterality: N/A;   BIOPSY  07/14/2020   Procedure: BIOPSY;  Surgeon: Charna Elizabeth, MD;  Location: [facility];  Service: Endoscopy;;   CARPAL TUNNEL RELEASE Bilateral 2008, 2010   CHOLECYSTECTOMY  01/11/2012   Procedure: LAPAROSCOPIC CHOLECYSTECTOMY WITH INTRAOPERATIVE CHOLANGIOGRAM;  Surgeon: Cherylynn Ridges, MD;  Location: [facility] OR;  Service: General;  Laterality: N/A;   COLONOSCOPY     COLONOSCOPY WITH PROPOFOL N/A 07/14/2020   Procedure: COLONOSCOPY WITH PROPOFOL;  Surgeon: Charna Elizabeth, MD;  [facility]: [facility];  Service: Endoscopy;  Laterality: N/A;   DILATION AND EVACUATION N/A 03/24/2020   Procedure: DILATATION AND EVACUATION;  Surgeon: Hoover Browns, MD;  Location: [facility];  Service: Gynecology;  Laterality: N/A;   ENDOSCOPIC RETROGRADE  CHOLANGIOPANCREATOGRAPHY (ERCP) WITH PROPOFOL N/A 08/29/2022   Procedure: ENDOSCOPIC RETROGRADE CHOLANGIOPANCREATOGRAPHY (ERCP) WITH PROPOFOL;  Surgeon: Jeani Hawking, MD;  [facility]: [facility];  Service: Gastroenterology;  Laterality: N/A;   ERCP N/A 08/22/2022   Procedure: ENDOSCOPIC RETROGRADE CHOLANGIOPANCREATOGRAPHY (ERCP);  Surgeon: Jeani Hawking, MD;  [facility];  Service: Gastroenterology;  Laterality: N/A;   LAPAROSCOPIC BILATERAL SALPINGECTOMY Right 08/14/2017   Procedure: LAPAROSCOPIC RIGHT  SALPINGECTOMY;  Surgeon: Silverio Lay, MD;  Location: [facility];  Service: Gynecology;  Laterality: Right;   LAPAROSCOPIC GASTRIC BANDING     REMOVAL OF STONES  08/22/2022   Procedure: REMOVAL OF STONES;  Surgeon: Jeani Hawking, MD;  [facility]: [facility];  Service: Gastroenterology;;   REMOVAL OF STONES  08/29/2022   Procedure: REMOVAL OF STONES;  Surgeon: Carol Ada, MD;  [facility];  Service: Gastroenterology;;   Joan Mayans  08/22/2022   Procedure: Joan Mayans;  Surgeon: Carol Ada, MD;  [facility];  Service: Gastroenterology;;   Bess Kinds CHOLANGIOSCOPY N/A 08/29/2022   Procedure: [id number] CHOLANGIOSCOPY;  Surgeon: Carol Ada, MD;  [facility];  Service: Gastroenterology;  Laterality: N/A;   SPYGLASS LITHOTRIPSY N/A 08/29/2022   Procedure: TMHDQQIW LITHOTRIPSY;  Surgeon: Carol Ada, MD;  [facility];  Service: Gastroenterology;  Laterality: N/A;   STONE EXTRACTION WITH BASKET  08/29/2022   Procedure: STONE EXTRACTION WITH BASKET;  Surgeon: Carol Ada, MD;  [facility]: [facility];  Service: Gastroenterology;;   UPPER GI ENDOSCOPY     WISDOM TOOTH EXTRACTION      Current Medications: Outpatient Medications Prior to Visit  Medication Sig Dispense Refill   acetaminophen (TYLENOL) 500 MG tablet Take 1,000 mg by mouth every 6 (six) hours as needed for moderate pain.     albuterol (VENTOLIN HFA) 108 (90 Base)  MCG/ACT inhaler Inhale 2 puffs into the lungs every 4 (four) hours as needed for wheezing. 18 g 11   ALPRAZolam (XANAX) 0.5 MG tablet Take 1 tablet (0.5 mg total) by mouth daily as needed for anxiety 30 tablet 0   B Complex-C (B-COMPLEX WITH VITAMIN C) tablet Take 1 tablet by mouth daily. 120 tablet 0   Calcipotriene-Betameth Diprop (ENSTILAR) 0.005-0.064 % FOAM Apply topically.     calcium carbonate (TUMS EX) 750 MG chewable tablet Chew 2 tablets by mouth daily as needed for heartburn.     Cholecalciferol 1.25 MG (50000 UT) capsule Take 1 capsule (50,000 Units total) by mouth once a week. 12 capsule 1   furosemide (LASIX) 20 MG tablet Take 1 tablet (20 mg total) by mouth every other day. (Patient taking differently: Take 20 mg by mouth daily as needed for fluid.) 45 tablet 3   imiquimod (ALDARA) 5 % cream Apply topically 3 (three) times a week at bedtime until warts completely disappear up to a maximum of 12 weeks. (Patient taking differently: Apply 1 Application topically See admin instructions. Up to 3 times a week at bedtime for warts) 24 each 3   Liraglutide -Weight Management (SAXENDA) 18 MG/3ML SOPN Inject 0.6 mg into the skin daily for 30 days, THEN 1.2 mg daily for 30 days, THEN 1.8 mg daily for 30 days. 15 mL 1   pantoprazole (PROTONIX) 40 MG tablet Take 1 tablet (40 mg total) by mouth daily. 14 tablet 0   phenazopyridine (PYRIDIUM) 200 MG tablet Take 1 tablet (200 mg total) by mouth 3 (three) times daily with meals. 10 tablet 0   Semaglutide-Weight Management (WEGOVY) 0.25 MG/0.5ML SOAJ Inject 0.25 mg into the skin once a week. 2 mL 0   tiZANidine (ZANAFLEX) 2 MG tablet Take 1-2 tablets (2-4 mg total) by mouth 3 (three) times daily as needed. (Patient taking differently: Take 2-4 mg by mouth 3 (three) times daily as needed for muscle spasms.) 60 tablet 2   Calcipotriene-Betameth Diprop (ENSTILAR) 0.005-0.064 % FOAM Apply 1 application topically daily. (Patient taking differently: Apply 1  application  topically daily as needed (psoriasis on hands).) 60 g 0   ciprofloxacin (CIPRO) 500 MG tablet Take 1 tablet (500 mg total) by mouth every 12 (twelve) hours for 10 days 20 tablet 0   No facility-administered medications prior to visit.     Allergies:   Patient has no known allergies.   Social History  Socioeconomic History   Marital status: Married    Spouse name: Not on file   Number of children: 1   Years of education: college   Highest education level: Not on file  Occupational History   Not on file  Tobacco Use   Smoking status: Former    Packs/day: 0.25    Years: 1.00    Total pack years: 0.25    Types: Cigars, Cigarettes    Quit date: 09/11/1995    Years since quitting: 27.0   Smokeless tobacco: Never  Vaping Use   Vaping Use: Never used  Substance and Sexual Activity   Alcohol use: Not Currently    Comment: socially-none since pregnancy - SVD 06/19/17   Drug use: No   Sexual activity: Not Currently    Birth control/protection: None    Comment: 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 Deirdre PriestIra Goldwaisser in [place]   Drinks 1-2 cups of coffee a day    Social Determinants of Health   Financial Resource Strain: Not on file  Food Insecurity: No Food Insecurity (08/19/2022)   Hunger Vital Sign    Worried About Running Out of Food in the Last Year: Never true    Ran Out of Food in the Last Year: Never true  Transportation Needs: No Transportation Needs (08/19/2022)   PRAPARE - Administrator, Civil ServiceTransportation    Lack of Transportation (Medical): No    Lack of Transportation (Non-Medical): No  Physical Activity: Not on file  Stress: Not on file  Social Connections: Not on file    Social history is notable that her family is from the [place].  She has lived in [place].  She is married for 9 years and has 2 children.  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  Family History:  The patient's family history includes Anesthesia problems in her mother; Asthma in her maternal grandmother and sister; Cervical cancer in her maternal aunt; Diabetes in her sister; Heart attack in her mother; Heart disease in an other family member; Hyperlipidemia in an other family member; Hypertension in her brother, father, mother, and another family member; Kidney disease in an other family member; Liver cancer in her paternal grandfather and paternal uncle; Ovarian cancer in her maternal aunt; Prostate cancer in her maternal uncle and paternal grandfather; Stroke in her maternal grandfather, mother, and another family member; Thyroid disease in an other family member.   Both parents are alive, mother age 44 and had previous heart attack and stroke and has hypertension.  Father is 4881 and has hypertension and hyperlipidemia.  She has 3 brothers 1 with asthma and high blood pressure and other with hypertension and hyperlipidemia.  She has 4 sisters all who have thyroid abnormalities and 1 with diabetes.  Her children are 248 and 44 years old.  ROS General: Negative; No fevers, chills, or night sweats;  HEENT: Negative; No changes in vision or hearing, sinus congestion, difficulty swallowing Pulmonary: Asthma Cardiovascular: See HPI GI: History of lap band bariatric surgery; negative; No nausea, vomiting, diarrhea, or abdominal pain GU: Negative; No dysuria, hematuria, or difficulty voiding Musculoskeletal: Negative; no myalgias, joint pain, or weakness Hematologic/Oncology: Negative; no easy bruising, bleeding Endocrine: Negative; no heat/cold intolerance; no diabetes Neuro: Negative; no changes in balance, headaches Skin: Eczema on enstilar Psychiatric: Negative; No behavioral problems, depression Sleep: Remote diagnosis of OSA and  temporary use CPAP.  Other comprehensive 14 point system review is negative.   PHYSICAL EXAM:   VS:  BP 128/70   Pulse 81    Ht 5' 4.5" (1.638 m)   Wt 298 lb (135.2 kg)   SpO2 95%   BMI 50.36 kg/m     Repeat blood pressure by me was 126/70  Wt Readings from Last 3 Encounters:  10/02/22 298 lb (135.2 kg)  08/29/22 299 lb 13.2 oz (136 kg)  08/19/22 (!) 301 lb 13 oz (136.9 kg)  She admits to a 50 pound weight gain over the past 3 years  Weight has improved from 311-2 98 over the past 6 weeks  General: Alert, oriented, no distress.  Super morbid obesity Skin: normal turgor, no rashes, warm and dry HEENT: Normocephalic, atraumatic. Pupils equal round and reactive to light; sclera anicteric; extraocular muscles intact;  Nose without nasal septal hypertrophy Mouth/Parynx benign; Mallinpatti scale 3 Neck: No JVD, no carotid bruits; normal carotid upstroke Lungs: clear to ausculatation and percussion; no wheezing or rales Chest wall: without tenderness to palpitation Heart: PMI not displaced, RRR, s1 s2 normal, 1/6 systolic murmur, no diastolic murmur, no rubs, gallops, thrills, or heaves Abdomen: soft, nontender; no hepatosplenomehaly, BS+; abdominal aorta nontender and not dilated by palpation. Back: no CVA tenderness Pulses 2+ Musculoskeletal: full range of motion, normal strength, no joint deformities Extremities: no clubbing cyanosis or edema, Homan's sign negative  Neurologic: grossly nonfocal; Cranial nerves grossly wnl Psychologic: Normal mood and affect    Studies/Labs Reviewed:   October 02, 2022 ECG (independently read by me): NSR at 81, no ectopy, normal intervals  August 15, 2022 ECG (independently read by me) NSR at 77, sinus arrhythmia  July 05, 2022 ECG (independently read by me):  NSR at 73   Recent Labs:    Latest Ref Rng & Units 08/23/2022    4:37 AM 08/22/2022    4:35 AM 08/21/2022   11:09 AM  BMP  Glucose 70 - 99 mg/dL 161142  83  97   BUN 6 - 20 mg/dL 13  11  10    Creatinine 0.44 - 1.00 mg/dL 0.960.73  0.450.87  4.090.69   Sodium 135 - 145 mmol/L 134  135  130   Potassium 3.5 - 5.1  mmol/L 4.4  3.8  5.2   Chloride 98 - 111 mmol/L 109  106  104   CO2 22 - 32 mmol/L 21  24  22    Calcium 8.9 - 10.3 mg/dL 8.5  8.3  8.4         Latest Ref Rng & Units 08/23/2022    4:37 AM 08/22/2022    4:35 AM 08/21/2022   11:09 AM  Hepatic Function  Total Protein 6.5 - 8.1 g/dL 8.3  7.8  9.0    8.1   Albumin 3.5 - 5.0 g/dL 2.6  2.5  2.8    2.5   AST 15 - 41 U/L 90  107  151    153   ALT 0 - 44 U/L 127  135  164    162   Alk Phosphatase 38 - 126 U/L 292  285  339    340   Total Bilirubin 0.3 - 1.2 mg/dL 1.2  1.8  3.9    3.7   Bilirubin, Direct 0.0 - 0.2 mg/dL  0.8  1.6        Latest Ref Rng & Units 08/22/2022    4:35 AM 08/21/2022   11:09 AM  08/20/2022   11:42 AM  CBC  WBC 4.0 - 10.5 K/uL 10.4  9.3  10.0   Hemoglobin 12.0 - 15.0 g/dL 9.8  10.8  11.0   Hematocrit 36.0 - 46.0 % 30.2  33.4  34.2   Platelets 150 - 400 K/uL 312  330  331    Lab Results  Component Value Date   MCV 68.8 (L) 08/22/2022   MCV 69.4 (L) 08/21/2022   MCV 69.1 (L) 08/20/2022   Lab Results  Component Value Date   TSH 2.460 04/24/2017   Lab Results  Component Value Date   HGBA1C 5.3 03/09/2022     BNP    Component Value Date/Time   BNP 27.9 08/19/2022 1429    ProBNP No results found for: "PROBNP"   Lipid Panel     Component Value Date/Time   CHOL 141 08/20/2022 0444   TRIG 41 08/20/2022 0444   HDL 36 (L) 08/20/2022 0444   CHOLHDL 3.9 08/20/2022 0444   VLDL 8 08/20/2022 0444   LDLCALC 97 08/20/2022 0444     RADIOLOGY: US ABDOMEN COMPLETE W/ELASTOGRAPHY  Result Date: 10/04/2022 CLINICAL DATA:  Fatty liver EXAM: ULTRASOUND ABDOMEN ULTRASOUND HEPATIC ELASTOGRAPHY TECHNIQUE: Sonography of the upper abdomen was performed. In addition, ultrasound elastography evaluation of the liver was performed. A region of interest was placed within the right lobe of the liver. Following application of a compressive sonographic pulse, tissue compressibility was assessed. Multiple  assessments were performed at the selected site. Median tissue compressibility was determined. Previously, hepatic stiffness was assessed by shear wave velocity. Based on recently published Society of Radiologists in Ultrasound consensus article, reporting is now recommended to be performed in the SI units of pressure (kiloPascals) representing hepatic stiffness/elasticity. The obtained result is compared to the published reference standards. (cACLD = compensated Advanced Chronic Liver Disease) COMPARISON:  MRI 08/21/2022 FINDINGS: ULTRASOUND ABDOMEN Gallbladder: Prior cholecystectomy Common bile duct: Diameter: Normal caliber, 3 mm Liver: Heterogeneous, increased echotexture compatible with fatty infiltration. No focal hepatic abnormality. Portal vein is patent on color Doppler imaging with normal direction of blood flow towards the liver. IVC: No abnormality visualized. Pancreas: Visualized portion unremarkable. Spleen: Size and appearance within normal limits. Right Kidney: Length: 12.3 cm. Echogenicity within normal limits. No mass or hydronephrosis visualized. Left Kidney: Length: 11.8 cm. Echogenicity within normal limits. No mass or hydronephrosis visualized. Abdominal aorta: No aneurysm visualized. Other findings: None. ULTRASOUND HEPATIC ELASTOGRAPHY Device: Siemens Helix VTQ Patient position: Supine Transducer DAX Number of measurements: 12 Hepatic segment:  8 Median kPa: 7.6 IQR: 2.7 IQR/Median kPa ratio: 0.36 Data quality:  Good Diagnostic category: < or = 9 kPa: in the absence of other known clinical signs, rules out cACLD 5 The use of hepatic elastography is applicable to patients with viral hepatitis and non-alcoholic fatty liver disease. At this time, there is insufficient data for the referenced cut-off values and use in other causes of liver disease, including alcoholic liver disease. Patients, however, may be assessed by elastography and serve as their own reference standard/baseline. In patients  with non-alcoholic liver disease, the values suggesting compensated advanced chronic liver disease (cACLD) may be lower, and patients may need additional testing with elasticity results of 7-9 kPa. Please note that abnormal hepatic elasticity and shear wave velocities may also be identified in clinical settings other than with hepatic fibrosis, such as: acute hepatitis, elevated right heart and central venous pressures including use of beta blockers, veno-occlusive disease (Budd-Chiari), infiltrative processes such as mastocytosis/amyloidosis/infiltrative tumor/lymphoma, extrahepatic cholestasis,  with hyperemia in the post-prandial state, and with liver transplantation. Correlation with patient history, laboratory data, and clinical condition recommended. Diagnostic Categories: < or =5 kPa: high probability of being normal < or =9 kPa: in the absence of other known clinical signs, rules out cACLD >9 kPa and ?13 kPa: suggestive of cACLD, but needs further testing >13 kPa: highly suggestive of cACLD > or =17 kPa: highly suggestive of cACLD with an increased probability of clinically significant portal hypertension IMPRESSION: ULTRASOUND ABDOMEN: Heterogeneous, increased echotexture throughout the liver suggesting fatty infiltration. ULTRASOUND HEPATIC ELASTOGRAPHY: Median kPa:  7.6 Diagnostic category: < or = 9 kPa: in the absence of other known clinical signs, rules out cACLD Electronically Signed   By: Charlett NoseKevin  Dover M.D.   On: 10/04/2022 11:06     Additional studies/ records that were reviewed today include:   I [facility].  Records of [facility] were reviewed.  Her recent [facility] hospitalization from December 10 through December 14 was reviewed.    ASSESSMENT:    1. Chest pain, noncardiac   2. Choledocholithiasis   3. Morbid obesity (HCC)   4. Evaluate for OSA   5. 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  Her ancestry is from the [place] but she had lived prior to coming to [place] in [place].  She has a history of mild asthma as well as eczema.  She had undergone prior lap band surgery, cholecystectomy, and right salpingectmy.  She admits that at times she has had blood pressure lability.  She recently began to notice sharp chest pain which awakened her from sleep associated with back, posterior neck and left arm radiation.  She was evaluated in the emergency room and was assessed in the [facility] encounter.  Subsequent CT angiography of her chest abdomen and pelvis was within normal limits chest x-ray.  Cardiac troponins were normal.  She was noted to have slight white blood count elevation of 13.8.  She had microcytic anemia with MCV 69 hemoglobin 10.8 hematocrit 33.0.  Lipase was 50.  There was mild transaminase elevation with AST 54 ALT 44 and alkaline phosphatase at 233.  Potassium was 3.6.  She ultimately left AGAINST MEDICAL ADVICE due to the very long delay.  She has seen her primary provider Karina Guyole Pedraza, PA-C and was referred to me for initial evaluation on August 15, 2022.  She subsequently was hospitalized from December 10 through December 14 and was found to have common bile duct stones leading to transaminitis.  She underwent ERCP and ultimate removal of stones with stent placement by Dr. Elnoria HowardHung.  Her cardiac evaluation was done in the hospital and she had negative chest CT for PE.  Essentially normal echo Doppler study with EF 60 to 65%.  Coronary CTA showed a calcium score of 0.  Incidentally she was noted to have a very small PFO.  When I initially had seen her I was also concerned for significant sleep apnea.  She has not yet had her split-night sleep study this again will be scheduled.  She will be seeing Dr. Elnoria HowardHung for GI follow-up and will need to have her stent removed approximately 2  months.  I discussed the importance of continued weight loss and exercise.  We discussed Mediterranean heart healthy diet.  I will see  her in 4 months for reevaluation or sooner as needed.   Medication Adjustments/Labs and Tests Ordered: Current medicines are reviewed at length with the patient today.  Concerns regarding medicines are outlined above.  Medication changes, Labs and Tests ordered today are listed in the Patient Instructions below. Patient Instructions   Follow-Up: At [facility], you and your health needs are our priority.  As part of our continuing mission to provide you with exceptional heart care, we have created designated Provider Care Teams.  These Care Teams include your primary Cardiologist (physician) and Advanced Practice Providers (APPs -  Physician Assistants and Nurse Practitioners) who all work together to provide you with the care you need, when you need it.  We recommend signing up for the patient portal called "MyChart".  Sign up information is provided on this After Visit Summary.  MyChart is used to connect with patients for Virtual Visits (Telemedicine).  Patients are able to view lab/test results, encounter notes, upcoming appointments, etc.  Non-urgent messages can be sent to your provider as well.   To learn more about what you can do with MyChart, go to [email or web address].    Your next appointment:   4 month(s)  Provider:   Nicki Guadalajara, MD        Signed, Nicki Guadalajara, MD  10/07/2022 10:05 AM    [facility] [address], [address], [place], [place]  [address] Phone: [phone]

## 2022-10-03 ENCOUNTER — Other Ambulatory Visit (HOSPITAL_COMMUNITY): Payer: Self-pay

## 2022-10-04 ENCOUNTER — Ambulatory Visit (HOSPITAL_COMMUNITY)
Admission: RE | Admit: 2022-10-04 | Discharge: 2022-10-04 | Disposition: A | Discharge status: Home / Self Care | Payer: Commercial Managed Care - PPO | Source: Ambulatory Visit | Attending: Gastroenterology | Admitting: Gastroenterology

## 2022-10-04 DIAGNOSIS — K760 Fatty (change of) liver, not elsewhere classified: Secondary | ICD-10-CM | POA: Diagnosis not present

## 2022-10-05 ENCOUNTER — Other Ambulatory Visit (HOSPITAL_COMMUNITY): Payer: Self-pay

## 2022-10-07 ENCOUNTER — Encounter: Payer: Self-pay | Admitting: Cardiovascular Disease

## 2022-10-08 ENCOUNTER — Other Ambulatory Visit (HOSPITAL_COMMUNITY): Payer: Self-pay

## 2022-10-08 ENCOUNTER — Telehealth: Payer: Self-pay | Admitting: *Deleted

## 2022-10-08 NOTE — Telephone Encounter (Signed)
Prior Authorization for split night sleep study sent to [facility] via Phone. Per automated system no PA is required.  Call Reference # [id number].

## 2022-10-10 ENCOUNTER — Other Ambulatory Visit (HOSPITAL_COMMUNITY): Payer: Self-pay

## 2022-10-12 ENCOUNTER — Other Ambulatory Visit (HOSPITAL_COMMUNITY): Payer: Self-pay

## 2022-10-15 ENCOUNTER — Other Ambulatory Visit (HOSPITAL_COMMUNITY): Payer: Self-pay

## 2022-10-17 ENCOUNTER — Telehealth: Payer: Self-pay | Admitting: *Deleted

## 2022-10-17 ENCOUNTER — Other Ambulatory Visit: Payer: Self-pay | Admitting: Cardiovascular Disease

## 2022-10-17 DIAGNOSIS — G4730 Sleep apnea, unspecified: Secondary | ICD-10-CM

## 2022-10-17 NOTE — Telephone Encounter (Signed)
My chart message sent to patient asking her to contact the sleep lab.

## 2022-10-19 ENCOUNTER — Other Ambulatory Visit (HOSPITAL_COMMUNITY): Payer: Self-pay

## 2022-10-19 MED ORDER — TECHLITE PEN NEEDLES 31G X 5 MM MISC
1.0000 | Freq: Every day | 0 refills | Status: AC
  Filled 2022-10-19: qty 100, 90d supply, fill #0

## 2022-10-24 ENCOUNTER — Other Ambulatory Visit (HOSPITAL_COMMUNITY): Payer: Self-pay

## 2022-10-30 DIAGNOSIS — R4189 Other symptoms and signs involving cognitive functions and awareness: Secondary | ICD-10-CM | POA: Diagnosis not present

## 2022-10-30 DIAGNOSIS — R748 Abnormal levels of other serum enzymes: Secondary | ICD-10-CM | POA: Diagnosis not present

## 2022-11-02 ENCOUNTER — Other Ambulatory Visit (HOSPITAL_COMMUNITY): Payer: Self-pay

## 2022-11-02 DIAGNOSIS — R748 Abnormal levels of other serum enzymes: Secondary | ICD-10-CM | POA: Diagnosis not present

## 2022-11-02 DIAGNOSIS — R4189 Other symptoms and signs involving cognitive functions and awareness: Secondary | ICD-10-CM | POA: Diagnosis not present

## 2022-11-27 ENCOUNTER — Telehealth: Payer: Self-pay | Admitting: Hematology and Oncology

## 2022-11-27 NOTE — Telephone Encounter (Signed)
Reached out to patient to schedule follow up, patient aware of date and time of appointment,

## 2022-11-29 ENCOUNTER — Other Ambulatory Visit (HOSPITAL_COMMUNITY): Payer: Self-pay

## 2022-11-29 DIAGNOSIS — K5190 Ulcerative colitis, unspecified, without complications: Secondary | ICD-10-CM | POA: Diagnosis not present

## 2022-11-29 DIAGNOSIS — K760 Fatty (change of) liver, not elsewhere classified: Secondary | ICD-10-CM | POA: Diagnosis not present

## 2022-12-03 ENCOUNTER — Other Ambulatory Visit (HOSPITAL_COMMUNITY): Payer: Self-pay

## 2022-12-05 ENCOUNTER — Other Ambulatory Visit (HOSPITAL_COMMUNITY): Payer: Self-pay

## 2022-12-10 ENCOUNTER — Other Ambulatory Visit (HOSPITAL_COMMUNITY): Payer: Self-pay

## 2022-12-17 ENCOUNTER — Other Ambulatory Visit (HOSPITAL_COMMUNITY): Payer: Self-pay

## 2022-12-18 ENCOUNTER — Other Ambulatory Visit (HOSPITAL_COMMUNITY): Payer: Self-pay

## 2022-12-19 ENCOUNTER — Other Ambulatory Visit (HOSPITAL_COMMUNITY): Payer: Self-pay

## 2022-12-19 MED ORDER — WEGOVY 0.5 MG/0.5ML ~~LOC~~ SOAJ
0.5000 mg | SUBCUTANEOUS | 3 refills | Status: DC
  Filled 2022-12-19: qty 3, 28d supply, fill #0

## 2022-12-20 ENCOUNTER — Other Ambulatory Visit (HOSPITAL_COMMUNITY): Payer: Self-pay

## 2022-12-20 ENCOUNTER — Other Ambulatory Visit: Payer: Self-pay | Admitting: Hematology and Oncology

## 2022-12-20 DIAGNOSIS — D508 Other iron deficiency anemias: Secondary | ICD-10-CM

## 2022-12-21 ENCOUNTER — Other Ambulatory Visit: Payer: Self-pay

## 2022-12-21 ENCOUNTER — Inpatient Hospital Stay: Payer: Commercial Managed Care - PPO | Attending: Hematology and Oncology | Admitting: Hematology and Oncology

## 2022-12-21 ENCOUNTER — Encounter: Payer: Self-pay | Admitting: Hematology and Oncology

## 2022-12-21 VITALS — BP 118/69 | HR 78 | Temp 97.4°F | Resp 18 | Ht 64.5 in | Wt 287.2 lb

## 2022-12-21 DIAGNOSIS — E538 Deficiency of other specified B group vitamins: Secondary | ICD-10-CM | POA: Insufficient documentation

## 2022-12-21 DIAGNOSIS — Z9884 Bariatric surgery status: Secondary | ICD-10-CM | POA: Diagnosis not present

## 2022-12-21 DIAGNOSIS — F5089 Other specified eating disorder: Secondary | ICD-10-CM | POA: Insufficient documentation

## 2022-12-21 DIAGNOSIS — D508 Other iron deficiency anemias: Secondary | ICD-10-CM | POA: Diagnosis not present

## 2022-12-21 DIAGNOSIS — Z79899 Other long term (current) drug therapy: Secondary | ICD-10-CM | POA: Diagnosis not present

## 2022-12-21 DIAGNOSIS — E559 Vitamin D deficiency, unspecified: Secondary | ICD-10-CM | POA: Diagnosis not present

## 2022-12-21 DIAGNOSIS — D509 Iron deficiency anemia, unspecified: Secondary | ICD-10-CM | POA: Diagnosis not present

## 2022-12-21 DIAGNOSIS — I878 Other specified disorders of veins: Secondary | ICD-10-CM

## 2022-12-21 NOTE — Assessment & Plan Note (Signed)
She is adamant she would not get any central venous access placement We will try to consult IV team for future IV access when she returns for intravenous iron

## 2022-12-21 NOTE — Assessment & Plan Note (Signed)
She has recurrent iron deficiency anemia Her recent hemoglobin check a month ago was around 10 I recommend 2 doses of intravenous iron sucrose 400 mg She has several appointments to see various different providers I encouraged the patient to get her labs rechecked again in 2 to 3 months She knows how to reach me through MyChart message and will let me know if she needs further IV iron infusion

## 2022-12-21 NOTE — Progress Notes (Signed)
[facility] OFFICE PROGRESS NOTE  Podraza, Rudy Jew, PA-C  ASSESSMENT & PLAN:  Iron deficiency anemia She has recurrent iron deficiency anemia Her recent hemoglobin check a month ago was around 10 I recommend 2 doses of intravenous iron sucrose 400 mg She has several appointments to see various different providers I encouraged the patient to get her labs rechecked again in 2 to 3 months She knows how to reach me through MyChart message and will let me know if she needs further IV iron infusion  Poor venous access She is adamant she would not get any central venous access placement We will try to consult IV team for future IV access when she returns for intravenous iron  No orders of the defined types were placed in this encounter.   The total time spent in the appointment was 20 minutes encounter with patients including review of chart and various tests results, discussions about plan of care and coordination of care plan   All questions were answered. The patient knows to call the clinic with any problems, questions or concerns. No barriers to learning was detected.    Artis Delay, MD 4/12/202410:07 AM  INTERVAL HISTORY: Arther Abbott 42 y.o. female returns for further evaluation for recurrent iron deficiency anemia She had significant changes to her health Recently, she needed biliary stent placement She also had recent chest pain and underwent cardiology evaluation Overall, it was thought to be related to untreated sleep apnea She complained of excessive fatigue She had numerous blood draws recently, complicated by poor venous access Her latest blood draw a month ago show hemoglobin around 10 She received 1 dose of intravenous iron infusion in December while she was hospitalized She had history of colitis but denies recent rectal bleeding  SUMMARY OF HEMATOLOGIC HISTORY:  [name] is here because of severe iron deficiency She  has diagnosis of thalassemia and history of bariatric surgery causing multiple mineral deficiencies: specifically, B12, iron, thiamine and vitamin D Her baseline blood work in July 2015 was normal. She had no recent labs since February 2017. She was found to have severe iron deficiency anemia Recently, she desired to become pregnant. She has a daughter, born in 47. Soon after that, she has an early trimester miscarriage She was evaluated by GYN and is currently on fertility treatment She was found to have abnormal CBC from recent blood work. She is recommended Iv iron therapy prior to becoming pregnant She complained of recent chest pain on exertion, shortness of breath on minimal exertion, pre-syncopal episodes, fatigue and palpitations. She had not noticed any recent bleeding such as epistaxis, hematuria or hematochezia. She had history of menorhagia The patient denies over the counter NSAID ingestion. She is not on antiplatelets agents. Her last colonoscopy was normal on 02/26/13. She had EGD on 08/28/12 which showed gastritis She had no prior history or diagnosis of cancer. Her age appropriate screening programs are up-to-date. 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 She never donated blood or received blood transfusion The patient was prescribed oral iron supplements and she takes three daily but it caused severe constipation without success of improving her anemia She received 2 doses of intravenous iron in July 2017 with improvement of ferritin level She was lost to follow-up.  In August 2020, she received further intravenous iron for recurrent iron deficiency In November 2021, she underwent colonoscopy and was diagnosed with colitis  I have reviewed the past medical history, past surgical history, social history and family history with the patient and they are unchanged from previous note.  ALLERGIES:  has No Known Allergies.  MEDICATIONS:   Current Outpatient Medications  Medication Sig Dispense Refill   acetaminophen (TYLENOL) 500 MG tablet Take 1,000 mg by mouth every 6 (six) hours as needed for moderate pain.     albuterol (VENTOLIN HFA) 108 (90 Base) MCG/ACT inhaler Inhale 2 puffs into the lungs every 4 (four) hours as needed for wheezing. 18 g 11   ALPRAZolam (XANAX) 0.5 MG tablet Take 1 tablet (0.5 mg total) by mouth daily as needed for anxiety 30 tablet 0   B Complex-C (B-COMPLEX WITH VITAMIN C) tablet Take 1 tablet by mouth daily. 120 tablet 0   Calcipotriene-Betameth Diprop (ENSTILAR) 0.005-0.064 % FOAM Apply topically.     calcium carbonate (TUMS EX) 750 MG chewable tablet Chew 2 tablets by mouth daily as needed for heartburn.     Cholecalciferol 1.25 MG (50000 UT) capsule Take 1 capsule (50,000 Units total) by mouth once a week. 12 capsule 1   furosemide (LASIX) 20 MG tablet Take 1 tablet (20 mg total) by mouth every other day. (Patient taking differently: Take 20 mg by mouth daily as needed for fluid.) 45 tablet 3   imiquimod (ALDARA) 5 % cream Apply topically 3 (three) times a week at bedtime until warts completely disappear up to a maximum of 12 weeks. (Patient taking differently: Apply 1 Application topically See admin instructions. Up to 3 times a week at bedtime for warts) 24 each 3   Insulin Pen Needle (TECHLITE PEN NEEDLES) 31G X 5 MM MISC Use 1 pen needle daily to inject saxenda 100 each 0   Liraglutide -Weight Management (SAXENDA) 18 MG/3ML SOPN Inject 0.6 mg into the skin daily for 30 days, THEN 1.2 mg daily for 30 days, THEN 1.8 mg daily for 30 days. 15 mL 1   pantoprazole (PROTONIX) 40 MG tablet Take 1 tablet (40 mg total) by mouth daily. 14 tablet 0   phenazopyridine (PYRIDIUM) 200 MG tablet Take 1 tablet (200 mg total) by mouth 3 (three) times daily with meals. 10 tablet 0   Semaglutide-Weight Management (WEGOVY) 0.25 MG/0.5ML SOAJ Inject 0.25 mg into the skin once a week. 2 mL 0   Semaglutide-Weight  Management (WEGOVY) 0.5 MG/0.5ML SOAJ Inject 0.5 mg into the skin once a week. 3 mL 3   tiZANidine (ZANAFLEX) 2 MG tablet Take 1-2 tablets (2-4 mg total) by mouth 3 (three) times daily as needed. (Patient taking differently: Take 2-4 mg by mouth 3 (three) times daily as needed for muscle spasms.) 60 tablet 2   No current facility-administered medications for this visit.     REVIEW OF SYSTEMS:   Constitutional: Denies fevers, chills or night sweats Eyes: Denies blurriness of vision Ears, nose, mouth, throat, and face: Denies mucositis or sore throat Respiratory: Denies cough, dyspnea or wheezes Cardiovascular: Denies palpitation, chest discomfort or lower extremity swelling Gastrointestinal:  Denies nausea, heartburn or change in bowel habits Skin: Denies abnormal skin rashes Lymphatics: Denies new lymphadenopathy or easy bruising Neurological:Denies numbness, tingling or new weaknesses Behavioral/Psych: Mood is stable, no new changes  All other systems were reviewed with the patient and are negative.  PHYSICAL EXAMINATION: ECOG PERFORMANCE STATUS: 1 - Symptomatic but completely ambulatory  Vitals:   12/21/22 0833  BP: 118/69  Pulse: 78  Resp: 18  Temp: (!) 97.4 F (36.3 C)  SpO2: 100%  Filed Weights   12/21/22 0833  Weight: 287 lb 3.2 oz (130.3 kg)    GENERAL:alert, no distress and comfortable  NEURO: alert & oriented x 3 with fluent speech, no focal motor/sensory deficits  LABORATORY DATA:  I have reviewed the data as listed     Component Value Date/Time   NA 134 (L) 08/23/2022 0437   K 4.4 08/23/2022 0437   CL 109 08/23/2022 0437   CO2 21 (L) 08/23/2022 0437   GLUCOSE 142 (H) 08/23/2022 0437   BUN 13 08/23/2022 0437   CREATININE 0.73 08/23/2022 0437   CALCIUM 8.5 (L) 08/23/2022 0437   PROT 8.3 (H) 08/23/2022 0437   ALBUMIN 2.6 (L) 08/23/2022 0437   AST 90 (H) 08/23/2022 0437   ALT 127 (H) 08/23/2022 0437   ALKPHOS 292 (H) 08/23/2022 0437   BILITOT 1.2  08/23/2022 0437   GFRNONAA >60 08/23/2022 0437   GFRAA >60 08/09/2017 1500    No results found for: "SPEP", "UPEP"  Lab Results  Component Value Date   WBC 10.4 08/22/2022   NEUTROABS 5.5 08/22/2022   HGB 9.8 (L) 08/22/2022   HCT 30.2 (L) 08/22/2022   MCV 68.8 (L) 08/22/2022   PLT 312 08/22/2022      Chemistry      Component Value Date/Time   NA 134 (L) 08/23/2022 0437   K 4.4 08/23/2022 0437   CL 109 08/23/2022 0437   CO2 21 (L) 08/23/2022 0437   BUN 13 08/23/2022 0437   CREATININE 0.73 08/23/2022 0437      Component Value Date/Time   CALCIUM 8.5 (L) 08/23/2022 0437   ALKPHOS 292 (H) 08/23/2022 0437   AST 90 (H) 08/23/2022 0437   ALT 127 (H) 08/23/2022 0437   BILITOT 1.2 08/23/2022 0437

## 2022-12-25 ENCOUNTER — Other Ambulatory Visit: Payer: Self-pay | Admitting: Gastroenterology

## 2022-12-28 ENCOUNTER — Other Ambulatory Visit: Payer: Self-pay | Admitting: Hematology and Oncology

## 2022-12-28 ENCOUNTER — Inpatient Hospital Stay: Payer: Commercial Managed Care - PPO

## 2022-12-28 ENCOUNTER — Other Ambulatory Visit: Payer: Self-pay

## 2022-12-28 VITALS — BP 112/67 | HR 60 | Temp 98.3°F | Resp 16

## 2022-12-28 DIAGNOSIS — Z9884 Bariatric surgery status: Secondary | ICD-10-CM | POA: Diagnosis not present

## 2022-12-28 DIAGNOSIS — D508 Other iron deficiency anemias: Secondary | ICD-10-CM

## 2022-12-28 DIAGNOSIS — E559 Vitamin D deficiency, unspecified: Secondary | ICD-10-CM | POA: Diagnosis not present

## 2022-12-28 DIAGNOSIS — D509 Iron deficiency anemia, unspecified: Secondary | ICD-10-CM | POA: Diagnosis not present

## 2022-12-28 DIAGNOSIS — F5089 Other specified eating disorder: Secondary | ICD-10-CM | POA: Diagnosis not present

## 2022-12-28 DIAGNOSIS — E538 Deficiency of other specified B group vitamins: Secondary | ICD-10-CM | POA: Diagnosis not present

## 2022-12-28 DIAGNOSIS — Z79899 Other long term (current) drug therapy: Secondary | ICD-10-CM | POA: Diagnosis not present

## 2022-12-28 MED ORDER — SODIUM CHLORIDE 0.9 % IV SOLN
400.0000 mg | Freq: Once | INTRAVENOUS | Status: AC
  Administered 2022-12-28: 400 mg via INTRAVENOUS
  Filled 2022-12-28: qty 20

## 2022-12-28 MED ORDER — SODIUM CHLORIDE 0.9 % IV SOLN: Freq: Once | INTRAVENOUS | Status: AC

## 2022-12-28 NOTE — Patient Instructions (Signed)

## 2023-01-02 ENCOUNTER — Other Ambulatory Visit (HOSPITAL_COMMUNITY): Payer: Self-pay

## 2023-01-03 ENCOUNTER — Encounter (HOSPITAL_COMMUNITY): Payer: Self-pay

## 2023-01-03 ENCOUNTER — Other Ambulatory Visit: Payer: Self-pay

## 2023-01-03 ENCOUNTER — Other Ambulatory Visit (HOSPITAL_COMMUNITY): Payer: Self-pay

## 2023-01-04 ENCOUNTER — Inpatient Hospital Stay: Payer: Commercial Managed Care - PPO

## 2023-01-04 VITALS — BP 109/71 | HR 74 | Temp 98.6°F | Resp 16

## 2023-01-04 DIAGNOSIS — F5089 Other specified eating disorder: Secondary | ICD-10-CM | POA: Diagnosis not present

## 2023-01-04 DIAGNOSIS — E538 Deficiency of other specified B group vitamins: Secondary | ICD-10-CM | POA: Diagnosis not present

## 2023-01-04 DIAGNOSIS — Z9884 Bariatric surgery status: Secondary | ICD-10-CM | POA: Diagnosis not present

## 2023-01-04 DIAGNOSIS — D509 Iron deficiency anemia, unspecified: Secondary | ICD-10-CM | POA: Diagnosis not present

## 2023-01-04 DIAGNOSIS — Z79899 Other long term (current) drug therapy: Secondary | ICD-10-CM | POA: Diagnosis not present

## 2023-01-04 DIAGNOSIS — E559 Vitamin D deficiency, unspecified: Secondary | ICD-10-CM | POA: Diagnosis not present

## 2023-01-04 DIAGNOSIS — D508 Other iron deficiency anemias: Secondary | ICD-10-CM

## 2023-01-04 MED ORDER — SODIUM CHLORIDE 0.9 % IV SOLN
400.0000 mg | Freq: Once | INTRAVENOUS | Status: AC
  Administered 2023-01-04: 400 mg via INTRAVENOUS
  Filled 2023-01-04: qty 20

## 2023-01-04 MED ORDER — METHYLPREDNISOLONE SODIUM SUCC 40 MG IJ SOLR
40.0000 mg | Freq: Once | INTRAMUSCULAR | Status: AC
  Administered 2023-01-04: 40 mg via INTRAVENOUS
  Filled 2023-01-04: qty 1

## 2023-01-04 MED ORDER — SODIUM CHLORIDE 0.9 % IV SOLN: Freq: Once | INTRAVENOUS | Status: AC

## 2023-01-04 MED ORDER — FAMOTIDINE IN NACL 20-0.9 MG/50ML-% IV SOLN
20.0000 mg | Freq: Once | INTRAVENOUS | Status: AC
  Administered 2023-01-04: 20 mg via INTRAVENOUS
  Filled 2023-01-04: qty 50

## 2023-01-04 NOTE — Patient Instructions (Signed)

## 2023-01-04 NOTE — Progress Notes (Signed)
Patient reports she had some chest pain and SOB with last infusion, patient reports she did not tell infusion nurse, just used her inhaler and it went away. Pepcid and Solumedrol added to treatment plan for Venofer 400mg .   Patient completed iron infusion without any side effects. She stayed for 30 minute observation. VSS at discharge.

## 2023-01-07 ENCOUNTER — Other Ambulatory Visit (HOSPITAL_COMMUNITY): Payer: Self-pay

## 2023-01-11 ENCOUNTER — Ambulatory Visit (INDEPENDENT_AMBULATORY_CARE_PROVIDER_SITE_OTHER): Payer: Commercial Managed Care - PPO | Admitting: Orthopedic Surgery

## 2023-01-11 ENCOUNTER — Other Ambulatory Visit (INDEPENDENT_AMBULATORY_CARE_PROVIDER_SITE_OTHER): Payer: Commercial Managed Care - PPO

## 2023-01-11 ENCOUNTER — Encounter: Payer: Self-pay | Admitting: Orthopedic Surgery

## 2023-01-11 VITALS — BP 111/74 | HR 82 | Ht 64.5 in | Wt 288.0 lb

## 2023-01-11 DIAGNOSIS — G8929 Other chronic pain: Secondary | ICD-10-CM

## 2023-01-11 DIAGNOSIS — M5441 Lumbago with sciatica, right side: Secondary | ICD-10-CM

## 2023-01-11 DIAGNOSIS — M5416 Radiculopathy, lumbar region: Secondary | ICD-10-CM

## 2023-01-11 NOTE — Progress Notes (Signed)
Orthopedic Spine Surgery Office Note  Assessment: Patient is a 42 y.o. female with progressive worsening of her chronic low back pain with right-sided radicular symptoms (describes an L5 or S1 distribution)   Plan: -Patient has tried muscle relaxers, PT, NSAIDs, Tylenol  -Patient has tried over 6 weeks of conservative treatment without any relief, so recommended MRI of the lumbar spine to evaluate for radiculopathy -Encouraged her to lose weight since that will likely help with her pain.  She would not be a candidate for elective spine surgery until she gets down to a weight of 220 pounds -Told her to work on core strengthening as well to help with her pain -Patient should return to office in 5 weeks, x-rays at next visit: None   Patient expressed understanding of the plan and all questions were answered to the patient's satisfaction.   ___________________________________________________________________________   History:  Patient is a 42 y.o. female who presents today for lumbar spine.  Patient has had about 20 years of low back pain that was previously tolerable.  It then got progressively worse starting about 2 years ago.  She noted pain in her low back radiating into her right lower extremity.  It goes along the posterior aspect of the thigh into the lateral aspect of the leg and into the plantar aspect of the foot.  She does not have any radiating pain on the left lower extremity.  Pain has gotten so severe that she is not able to play with her children or dance which are things that she enjoys to do.  She feels pain on a daily basis.   Weakness: Has felt weakness in her back.  No other weakness noted Symptoms of imbalance: Denies Paresthesias and numbness: Denies Bowel or bladder incontinence: Denies Saddle anesthesia: Denies  Treatments tried: muscle relaxers, PT, NSAIDs, Tylenol  Review of systems: Denies fevers and chills, night sweats, unexplained weight loss, history of  cancer, pain that wakes them at night  Past medical history: Asthma Iron deficiency Vitamin D deficiency Cirrhosis Neuropathy Chronic pain Anxiety GERD OSA  Allergies: NKDA  Past surgical history:  Bilateral carpal tunnel release Cholecystectomy Biliary stent placement Gastric band Spyglass lithotripsy  Social history: Denies use of nicotine product (smoking, vaping, patches, smokeless) Alcohol use: denies Denies recreational drug use   Physical Exam:  BMI of 48.7  General: no acute distress, appears stated age Neurologic: alert, answering questions appropriately, following commands Respiratory: unlabored breathing on room air, symmetric chest rise Psychiatric: appropriate affect, normal cadence to speech   MSK (spine):  -Strength exam      Left  Right EHL    5/5  5/5 TA    5/5  5/5 GSC    5/5  5/5 Knee extension  5/5  5/5 Hip flexion   5/5  5/5  -Sensory exam    Sensation intact to light touch in L3-S1 nerve distributions of bilateral lower extremities  -Achilles DTR: 1/4 on the left, 1/4 on the right -Patellar tendon DTR: 1/4 on the left, 1/4 on the right  -Straight leg raise: Negative bilaterally -Femoral nerve stretch test: Negative bilaterally -Clonus: no beats bilaterally  -Left hip exam: No pain through range of motion, negative Stinchfield, negative FABER -Right hip exam: No pain through range of motion, negative Stinchfield, negative FABER  Imaging: XR of the lumbar spine from 01/11/2023 was independently reviewed and interpreted, showing disc height loss with anterior osteophyte formation at L3/4 and L4/5.  No evidence of instability on flexion/extension views.  No fracture or dislocation seen.   Patient name: Karina [name] Patient MRN: [id number] Date of visit: 01/11/23

## 2023-01-17 ENCOUNTER — Encounter: Payer: Self-pay | Admitting: Hematology and Oncology

## 2023-01-28 DIAGNOSIS — Z6841 Body Mass Index (BMI) 40.0 and over, adult: Secondary | ICD-10-CM | POA: Diagnosis not present

## 2023-01-29 ENCOUNTER — Encounter: Payer: Self-pay | Admitting: Orthopedic Surgery

## 2023-01-29 ENCOUNTER — Other Ambulatory Visit: Payer: Self-pay

## 2023-01-29 ENCOUNTER — Other Ambulatory Visit (HOSPITAL_COMMUNITY): Payer: Self-pay

## 2023-02-05 ENCOUNTER — Encounter: Payer: Self-pay | Admitting: Hematology and Oncology

## 2023-02-05 ENCOUNTER — Other Ambulatory Visit (HOSPITAL_COMMUNITY): Payer: Self-pay

## 2023-02-06 ENCOUNTER — Other Ambulatory Visit (HOSPITAL_COMMUNITY): Payer: Self-pay

## 2023-02-06 ENCOUNTER — Encounter: Payer: Self-pay | Admitting: Hematology and Oncology

## 2023-02-08 ENCOUNTER — Encounter (HOSPITAL_COMMUNITY): Payer: Self-pay | Admitting: Gastroenterology

## 2023-02-11 ENCOUNTER — Other Ambulatory Visit (HOSPITAL_COMMUNITY): Payer: Self-pay

## 2023-02-11 DIAGNOSIS — N946 Dysmenorrhea, unspecified: Secondary | ICD-10-CM | POA: Diagnosis not present

## 2023-02-11 DIAGNOSIS — Z3040 Encounter for surveillance of contraceptives, unspecified: Secondary | ICD-10-CM | POA: Diagnosis not present

## 2023-02-11 DIAGNOSIS — Z01419 Encounter for gynecological examination (general) (routine) without abnormal findings: Secondary | ICD-10-CM | POA: Diagnosis not present

## 2023-02-11 DIAGNOSIS — E221 Hyperprolactinemia: Secondary | ICD-10-CM | POA: Diagnosis not present

## 2023-02-11 DIAGNOSIS — Z1231 Encounter for screening mammogram for malignant neoplasm of breast: Secondary | ICD-10-CM | POA: Diagnosis not present

## 2023-02-11 DIAGNOSIS — Z6841 Body Mass Index (BMI) 40.0 and over, adult: Secondary | ICD-10-CM | POA: Diagnosis not present

## 2023-02-11 DIAGNOSIS — Z1211 Encounter for screening for malignant neoplasm of colon: Secondary | ICD-10-CM | POA: Diagnosis not present

## 2023-02-11 DIAGNOSIS — N920 Excessive and frequent menstruation with regular cycle: Secondary | ICD-10-CM | POA: Diagnosis not present

## 2023-02-11 DIAGNOSIS — E88819 Insulin resistance, unspecified: Secondary | ICD-10-CM | POA: Diagnosis not present

## 2023-02-11 MED ORDER — METFORMIN HCL ER 500 MG PO TB24
1000.0000 mg | ORAL_TABLET | Freq: Every day | ORAL | 3 refills | Status: AC
  Filled 2023-02-11: qty 180, 90d supply, fill #0
  Filled 2023-11-20: qty 180, 90d supply, fill #1

## 2023-02-13 ENCOUNTER — Ambulatory Visit: Payer: Commercial Managed Care - PPO | Admitting: Orthopedic Surgery

## 2023-02-15 ENCOUNTER — Ambulatory Visit (HOSPITAL_BASED_OUTPATIENT_CLINIC_OR_DEPARTMENT_OTHER): Payer: Commercial Managed Care - PPO | Admitting: Anesthesiology

## 2023-02-15 ENCOUNTER — Other Ambulatory Visit: Payer: Self-pay

## 2023-02-15 ENCOUNTER — Encounter (HOSPITAL_COMMUNITY)
Admission: RE | Disposition: A | Discharge status: Home / Self Care | Payer: Self-pay | Source: Home / Self Care | Attending: Gastroenterology

## 2023-02-15 ENCOUNTER — Ambulatory Visit (HOSPITAL_COMMUNITY)
Admission: RE | Admit: 2023-02-15 | Discharge: 2023-02-15 | Disposition: A | Discharge status: Home / Self Care | Payer: Commercial Managed Care - PPO | Attending: Gastroenterology | Admitting: Gastroenterology

## 2023-02-15 ENCOUNTER — Other Ambulatory Visit (HOSPITAL_COMMUNITY): Payer: Self-pay

## 2023-02-15 ENCOUNTER — Encounter (HOSPITAL_COMMUNITY): Payer: Self-pay | Admitting: Gastroenterology

## 2023-02-15 ENCOUNTER — Ambulatory Visit (HOSPITAL_COMMUNITY): Payer: Commercial Managed Care - PPO

## 2023-02-15 ENCOUNTER — Ambulatory Visit (HOSPITAL_COMMUNITY): Payer: Commercial Managed Care - PPO | Admitting: Anesthesiology

## 2023-02-15 DIAGNOSIS — Z6841 Body Mass Index (BMI) 40.0 and over, adult: Secondary | ICD-10-CM | POA: Diagnosis not present

## 2023-02-15 DIAGNOSIS — E282 Polycystic ovarian syndrome: Secondary | ICD-10-CM | POA: Insufficient documentation

## 2023-02-15 DIAGNOSIS — J4489 Other specified chronic obstructive pulmonary disease: Secondary | ICD-10-CM | POA: Diagnosis not present

## 2023-02-15 DIAGNOSIS — K838 Other specified diseases of biliary tract: Secondary | ICD-10-CM | POA: Insufficient documentation

## 2023-02-15 DIAGNOSIS — K8050 Calculus of bile duct without cholangitis or cholecystitis without obstruction: Secondary | ICD-10-CM | POA: Diagnosis not present

## 2023-02-15 DIAGNOSIS — G4730 Sleep apnea, unspecified: Secondary | ICD-10-CM | POA: Diagnosis not present

## 2023-02-15 DIAGNOSIS — Z8249 Family history of ischemic heart disease and other diseases of the circulatory system: Secondary | ICD-10-CM | POA: Diagnosis not present

## 2023-02-15 DIAGNOSIS — J45909 Unspecified asthma, uncomplicated: Secondary | ICD-10-CM | POA: Diagnosis not present

## 2023-02-15 DIAGNOSIS — Z9049 Acquired absence of other specified parts of digestive tract: Secondary | ICD-10-CM | POA: Diagnosis not present

## 2023-02-15 DIAGNOSIS — Z87891 Personal history of nicotine dependence: Secondary | ICD-10-CM | POA: Insufficient documentation

## 2023-02-15 DIAGNOSIS — R932 Abnormal findings on diagnostic imaging of liver and biliary tract: Secondary | ICD-10-CM | POA: Diagnosis not present

## 2023-02-15 DIAGNOSIS — R109 Unspecified abdominal pain: Secondary | ICD-10-CM | POA: Diagnosis not present

## 2023-02-15 DIAGNOSIS — Z4659 Encounter for fitting and adjustment of other gastrointestinal appliance and device: Secondary | ICD-10-CM | POA: Diagnosis not present

## 2023-02-15 HISTORY — PX: STENT REMOVAL: SHX6421

## 2023-02-15 HISTORY — PX: REMOVAL OF STONES: SHX5545

## 2023-02-15 HISTORY — PX: SPYGLASS CHOLANGIOSCOPY: SHX5441

## 2023-02-15 HISTORY — PX: BIOPSY: SHX5522

## 2023-02-15 HISTORY — PX: ENDOSCOPIC RETROGRADE CHOLANGIOPANCREATOGRAPHY (ERCP) WITH PROPOFOL: SHX5810

## 2023-02-15 LAB — PREGNANCY, URINE: Preg Test, Ur: NEGATIVE

## 2023-02-15 SURGERY — ENDOSCOPIC RETROGRADE CHOLANGIOPANCREATOGRAPHY (ERCP) WITH PROPOFOL
Anesthesia: General

## 2023-02-15 MED ORDER — SUCCINYLCHOLINE CHLORIDE 200 MG/10ML IV SOSY
PREFILLED_SYRINGE | INTRAVENOUS | Status: DC | PRN
  Administered 2023-02-15: 120 mg via INTRAVENOUS

## 2023-02-15 MED ORDER — DICLOFENAC SUPPOSITORY 100 MG
RECTAL | Status: DC | PRN
  Administered 2023-02-15: 100 mg via RECTAL

## 2023-02-15 MED ORDER — MIDAZOLAM HCL 2 MG/2ML IJ SOLN
INTRAMUSCULAR | Status: AC
  Filled 2023-02-15: qty 2

## 2023-02-15 MED ORDER — SUGAMMADEX SODIUM 200 MG/2ML IV SOLN
INTRAVENOUS | Status: DC | PRN
  Administered 2023-02-15: 300 mg via INTRAVENOUS

## 2023-02-15 MED ORDER — MIDAZOLAM HCL 5 MG/5ML IJ SOLN
INTRAMUSCULAR | Status: DC | PRN
  Administered 2023-02-15: 2 mg via INTRAVENOUS

## 2023-02-15 MED ORDER — GLUCAGON HCL RDNA (DIAGNOSTIC) 1 MG IJ SOLR
INTRAMUSCULAR | Status: AC
  Filled 2023-02-15: qty 1

## 2023-02-15 MED ORDER — ROCURONIUM BROMIDE 10 MG/ML (PF) SYRINGE
PREFILLED_SYRINGE | INTRAVENOUS | Status: DC | PRN
  Administered 2023-02-15: 30 mg via INTRAVENOUS

## 2023-02-15 MED ORDER — ONDANSETRON HCL 4 MG/2ML IJ SOLN
INTRAMUSCULAR | Status: DC | PRN
  Administered 2023-02-15: 4 mg via INTRAVENOUS

## 2023-02-15 MED ORDER — PROPOFOL 10 MG/ML IV BOLUS
INTRAVENOUS | Status: DC | PRN
  Administered 2023-02-15: 200 mg via INTRAVENOUS

## 2023-02-15 MED ORDER — SODIUM CHLORIDE 0.9 % IV SOLN
INTRAVENOUS | Status: DC | PRN
  Administered 2023-02-15: 45 mL

## 2023-02-15 MED ORDER — LACTATED RINGERS IV SOLN: INTRAVENOUS | Status: DC

## 2023-02-15 MED ORDER — FENTANYL CITRATE (PF) 250 MCG/5ML IJ SOLN
INTRAMUSCULAR | Status: DC | PRN
  Administered 2023-02-15 (×2): 50 ug via INTRAVENOUS

## 2023-02-15 MED ORDER — DEXAMETHASONE SODIUM PHOSPHATE 10 MG/ML IJ SOLN
INTRAMUSCULAR | Status: DC | PRN
  Administered 2023-02-15: 10 mg via INTRAVENOUS

## 2023-02-15 MED ORDER — CIPROFLOXACIN IN D5W 400 MG/200ML IV SOLN
INTRAVENOUS | Status: AC
  Filled 2023-02-15: qty 200

## 2023-02-15 MED ORDER — PROPOFOL 10 MG/ML IV BOLUS
INTRAVENOUS | Status: AC
  Filled 2023-02-15: qty 20

## 2023-02-15 MED ORDER — SODIUM CHLORIDE 0.9 % IV SOLN: INTRAVENOUS | Status: DC

## 2023-02-15 MED ORDER — DICLOFENAC SUPPOSITORY 100 MG
RECTAL | Status: AC
  Filled 2023-02-15: qty 1

## 2023-02-15 MED ORDER — FENTANYL CITRATE (PF) 100 MCG/2ML IJ SOLN
INTRAMUSCULAR | Status: AC
  Filled 2023-02-15: qty 2

## 2023-02-15 MED ORDER — LIDOCAINE 2% (20 MG/ML) 5 ML SYRINGE
INTRAMUSCULAR | Status: DC | PRN
  Administered 2023-02-15: 60 mg via INTRAVENOUS

## 2023-02-15 NOTE — H&P (Signed)
Karina [name] HPI: The patient has a history of choledocholithiasis.  During the last ERCP a plastic biliary stent was placed.  She is here today for stent removal and reevaluation of the biliary tract.  There was also the possibility of a biliary stricture.  Past Medical History:  Diagnosis Date   Anemia    takes iron daily   Anxiety    no meds   Asthma    albuterol as needed   Chronic bronchitis    bronchitis x 1 event in her twenties   Complication of anesthesia    decreased respirations with lap band sx   Dysrhythmia     " arrhythmia during endoscopy procedure "    Edema extremities    hx of edema in lower ext.used to take lasix - none since twenties   Gallbladder disease    GERD (gastroesophageal reflux disease)    nexium    Hemoglobin C trait (HCC)    History of chicken pox    HSV (herpes simplex virus) anogenital infection    Neuropathy    feet   Pre-diabetes    Pregnant    svd 06/19/17   Sleep apnea    Does not use cpap nightly   SVD (spontaneous vaginal delivery)    x 2   Wears glasses     Past Surgical History:  Procedure Laterality Date   BILIARY STENT PLACEMENT N/A 08/29/2022   Procedure: BILIARY STENT PLACEMENT;  Surgeon: Jeani Hawking, MD;  Location: [facility];  Service: Gastroenterology;  Laterality: N/A;   BIOPSY  07/14/2020   Procedure: BIOPSY;  Surgeon: Charna Elizabeth, MD;  Location: [facility];  Service: Endoscopy;;   CARPAL TUNNEL RELEASE Bilateral 2008, 2010   CHOLECYSTECTOMY  01/11/2012   Procedure: LAPAROSCOPIC CHOLECYSTECTOMY WITH INTRAOPERATIVE CHOLANGIOGRAM;  Surgeon: Cherylynn Ridges, MD;  Location: [facility] OR;  Service: General;  Laterality: N/A;   COLONOSCOPY     COLONOSCOPY WITH PROPOFOL N/A 07/14/2020   Procedure: COLONOSCOPY WITH PROPOFOL;  Surgeon: Charna Elizabeth, MD;  Location: [facility];  Service: Endoscopy;  Laterality: N/A;   DILATION AND EVACUATION N/A 03/24/2020   Procedure: DILATATION AND EVACUATION;  Surgeon: Hoover Browns, MD;   Location: [facility] OR;  Service: Gynecology;  Laterality: N/A;   ENDOSCOPIC RETROGRADE CHOLANGIOPANCREATOGRAPHY (ERCP) WITH PROPOFOL N/A 08/29/2022   Procedure: ENDOSCOPIC RETROGRADE CHOLANGIOPANCREATOGRAPHY (ERCP) WITH PROPOFOL;  Surgeon: Jeani Hawking, MD;  Location: [facility];  Service: Gastroenterology;  Laterality: N/A;   ERCP N/A 08/22/2022   Procedure: ENDOSCOPIC RETROGRADE CHOLANGIOPANCREATOGRAPHY (ERCP);  Surgeon: Jeani Hawking, MD;  Location: [facility];  Service: Gastroenterology;  Laterality: N/A;   LAPAROSCOPIC BILATERAL SALPINGECTOMY Right 08/14/2017   Procedure: LAPAROSCOPIC RIGHT  SALPINGECTOMY;  Surgeon: Silverio Lay, MD;  Location: [facility];  Service: Gynecology;  Laterality: Right;   LAPAROSCOPIC GASTRIC BANDING     REMOVAL OF STONES  08/22/2022   Procedure: REMOVAL OF STONES;  Surgeon: Jeani Hawking, MD;  Location: [facility];  Service: Gastroenterology;;   REMOVAL OF STONES  08/29/2022   Procedure: REMOVAL OF STONES;  Surgeon: Jeani Hawking, MD;  Location: [facility];  Service: Gastroenterology;;   Dennison Mascot  08/22/2022   Procedure: Dennison Mascot;  Surgeon: Jeani Hawking, MD;  Location: [facility];  Service: Gastroenterology;;   Burman Freestone CHOLANGIOSCOPY N/A 08/29/2022   Procedure: [id number] CHOLANGIOSCOPY;  Surgeon: Jeani Hawking, MD;  Location: [facility];  Service: Gastroenterology;  Laterality: N/A;   SPYGLASS LITHOTRIPSY N/A 08/29/2022   Procedure: [id number] LITHOTRIPSY;  Surgeon: Jeani Hawking, MD;  Location: [facility];  Service: Gastroenterology;  Laterality: N/A;   STONE EXTRACTION WITH BASKET  08/29/2022   Procedure: STONE EXTRACTION WITH BASKET;  Surgeon: Jeani Hawking, MD;  Location: [facility];  Service: Gastroenterology;;   UPPER GI ENDOSCOPY     WISDOM TOOTH EXTRACTION      Family History  Problem Relation Age of Onset   Heart attack Mother    Stroke Mother    Hypertension Mother    Anesthesia problems Mother    Hypertension Father     Diabetes Sister    Asthma Sister    Hypertension Brother    Asthma Maternal Grandmother    Stroke Maternal Grandfather    Liver cancer Paternal Grandfather    Prostate cancer Paternal Grandfather    Ovarian cancer Maternal Aunt    Cervical cancer Maternal Aunt        cervial and thyroid   Prostate cancer Maternal Uncle    Liver cancer Paternal Uncle    Heart disease Other    Hyperlipidemia Other    Hypertension Other    Stroke Other    Kidney disease Other    Thyroid disease Other    Hypotension Neg Hx    Malignant hyperthermia Neg Hx    Pseudochol deficiency Neg Hx     Social History:  reports that she quit smoking about 27 years ago. Her smoking use included cigars and cigarettes. She has a 0.25 pack-year smoking history. She has never used smokeless tobacco. She reports that she does not currently use alcohol. She reports that she does not use drugs.  Allergies: Not on File  Medications: Scheduled: Continuous:  sodium chloride     lactated ringers 50 mL/hr at 02/15/23 1000    Results for orders placed or performed during the hospital encounter of 02/15/23 (from the past 24 hour(s))  Pregnancy, urine     Status: None   Collection Time: 02/15/23  9:31 AM  Result Value Ref Range   Preg Test, Ur NEGATIVE NEGATIVE     No results found.  ROS:  As stated above in the HPI otherwise negative.  Blood pressure 126/69, pulse 81, temperature 99.1 F (37.3 C), temperature source Temporal, resp. rate 20, weight 136 kg, last menstrual period 02/03/2023, SpO2 100 %, currently breastfeeding.    PE: Gen: NAD, Alert and Oriented HEENT:  [place]/AT, EOMI Neck: Supple, no LAD Lungs: CTA Bilaterally CV: RRR without M/G/R ABD: Soft, NTND, +BS, morbidly obese Ext: No C/C/E  Assessment/Plan: 1) Biliary stent - ERCP with stent removal.  Grayden Burley D 02/15/2023, 10:04 AM

## 2023-02-15 NOTE — Transfer of Care (Signed)
Immediate Anesthesia Transfer of Care Note  Patient: Karina [name]  Procedure(s) Performed: Procedure(s): ENDOSCOPIC RETROGRADE CHOLANGIOPANCREATOGRAPHY (ERCP) WITH PROPOFOL (N/A)  Patient Location: PACU  Anesthesia Type:General  Level of Consciousness: Alert, Awake, Oriented  Airway & Oxygen Therapy: Patient Spontanous Breathing  Post-op Assessment: Report given to RN  Post vital signs: Reviewed and stable  Last Vitals:  Vitals:   02/15/23 0941  BP: 126/69  Pulse: 81  Resp: 20  Temp: 37.3 C  SpO2: 100%    Complications: No apparent anesthesia complications

## 2023-02-15 NOTE — Discharge Instructions (Signed)

## 2023-02-15 NOTE — Op Note (Signed)
[facility] Patient Name: Karina [name] Procedure Date: 02/15/2023 MRN: [id number] Attending MD: Jeani Hawking , MD, [id number] Date of Birth: [birth year] CSN: [id number] Age: 42 Admit Type: Outpatient Procedure:                ERCP Indications:              Stent removal Providers:                Jeani Hawking, MD, Lorenza Evangelist, RN, Salley Scarlet, Technician, Romie Minus, CRNA Referring MD:              Medicines:                General Anesthesia Complications:            No immediate complications. Estimated Blood Loss:     Estimated blood loss: none. Procedure:                Pre-Anesthesia Assessment:                           - Prior to the procedure, a History and Physical                            was performed, and patient medications and                            allergies were reviewed. The patient's tolerance of                            previous anesthesia was also reviewed. The risks                            and benefits of the procedure and the sedation                            options and risks were discussed with the patient.                            All questions were answered, and informed consent                            was obtained. Prior Anticoagulants: The patient has                            taken no anticoagulant or antiplatelet agents. ASA                            Grade Assessment: III - A patient with severe                            systemic disease. After reviewing the risks and  benefits, the patient was deemed in satisfactory                            condition to undergo the procedure.                           - Sedation was administered by an anesthesia                            professional. General anesthesia was attained.                           After obtaining informed consent, the scope was                            passed under direct vision.  Throughout the                            procedure, the patient's blood pressure, pulse, and                            oxygen saturations were monitored continuously. The                            [id number]-Q190V ([id number]) Olympus duodenoscope was                            introduced through the mouth, and used to inject                            contrast into and used to inject contrast into the                            bile duct. The ERCP was technically difficult and                            complex. The patient tolerated the procedure well. Scope In: Scope Out: Findings:      A biliary stent was visible on the scout film. The esophagus was       successfully intubated under direct vision. The scope was advanced to a       normal major papilla in the descending duodenum without detailed       examination of the pharynx, larynx and associated structures, and upper       GI tract. The upper GI tract was grossly normal. The bile duct was       deeply cannulated with the short-nosed traction sphincterotome. Contrast       was injected. I personally interpreted the bile duct images. There was       brisk flow of contrast through the ducts. Image quality was excellent.       Contrast extended to the hepatic ducts. The common hepatic duct was       markedly dilated and diffusely dilated, acquired. The largest diameter       was 17 mm. A cholecystectomy had been performed. A long 0.035 inch Stiff  Al Pimple was passed into the biliary tree. The bile duct was explored       endoscopically using the SpyGlass direct visualization system. The       SpyScope was advanced to the upper third of the main bile duct.       Visibility with the scope was excellent. Mucosa with a villiform       appearance was found in the lower third of the main bile duct and common       bile duct. The lower third of the main bile duct was biopsied with a       SpyBite miniature biopsy forceps for histology. The  biliary tree was       swept with a 15 mm balloon starting at the bifurcation. Sludge was swept       from the duct. All stones were removed.      The duodenoscope was advanced to the second portion of the duodenum with       ease. The biliary stent was noted to be in excellent position       endoscopically and fluoroscopically. The stent was clogged and it was       easily removed with the snare. Cannulation of the CBD was performed       without any difficulty and the guidewire was secured in the left       intrahepatic ducts. Contrast injection revealed a dilated proximal       hepatic/left bile duct. There was an ectatic component to the distal       portion of the CHD and the proximal CBD. It was not clear if this       represented stones. There was also a "waist" at this transition point       and it was near the cholecystectomy clips. The bile duct was swept       multiple times moving from distal sweeping to proximal sweeping. Small       stone fragments were extracted as well as sludge. At the "waist" the       balloon had difficult sweeping this area and it required a deflation       from 15 mm to 12 mm. However, even at 12 mm, there was still some       resistance with pulling the balloon through. Because of the complexity       of the case and the fluoroscopic findings, a Spyglass was employed to       further evaluate the lumen/mucosa endoscopically. With Spyglass, there       was no evidence of any retained stones. The CHD was dilated, but no       mucosal abnormalities were found. At the "waist" the lumen was smaller       in caliber, but there was evidence of some acute angulations. There was       evidence of some inflammation/villiform mucosa and this was biopsied       with SpyBite. The noted erythema may be from the prior stent extraction.       With the dilated CHD and the angulations in the distal CHD/proximal CBD,       she may be at risk for future development of  stones. A final occlusion       cholangiogram was performed and there was no evidence of any retained       stones and under hydrostatic pressure the waisting decreased. Impression:               -  Villiform appearance of the lower third of the                            main bile duct and common bile duct mucosa was                            found.                           - The common hepatic duct was markedly dilated,                            acquired.                           - The patient has had a cholecystectomy.                           - Choledocholithiasis was found. Complete removal                            was accomplished by balloon extraction.                           - Biopsy was performed in the lower third of the                            main duct.                           - The biliary tree was swept. Moderate Sedation:      Not Applicable - Patient had care per Anesthesia. Recommendation:           - Patient has a contact number available for                            emergencies. The signs and symptoms of potential                            delayed complications were discussed with the                            patient. Return to normal activities tomorrow.                            Written discharge instructions were provided to the                            patient.                           - Resume regular diet.                           - Return to GI clinic in 4 weeks. Procedure Code(s):        --- Professional ---  16109, Endoscopic retrograde                            cholangiopancreatography (ERCP); with removal of                            calculi/debris from biliary/pancreatic duct(s)                           43261, Endoscopic retrograde                            cholangiopancreatography (ERCP); with biopsy,                            single or multiple                           43273, Endoscopic cannulation  of papilla with                            direct visualization of pancreatic/common bile                            duct(s) (List separately in addition to code(s) for                            primary procedure)                           60454, Endoscopic catheterization of the biliary                            ductal system, radiological supervision and                            interpretation Diagnosis Code(s):        --- Professional ---                           K80.50, Calculus of bile duct without cholangitis                            or cholecystitis without obstruction                           K83.9, Disease of biliary tract, unspecified                           Z90.49, Acquired absence of other specified parts                            of digestive tract                           Z46.59, Encounter for fitting and adjustment of  other gastrointestinal appliance and device                           K83.8, Other specified diseases of biliary tract CPT copyright 2022 American [employer] Association. All rights reserved. The codes documented in this report are preliminary and upon coder review may  be revised to meet current compliance requirements. Jeani Hawking, MD Jeani Hawking, MD 02/15/2023 12:01:04 PM This report has been signed electronically. Number of Addenda: 0

## 2023-02-15 NOTE — Anesthesia Procedure Notes (Signed)
Procedure Name: Intubation Date/Time: 02/15/2023 10:33 AM  Performed by: Lovie Chol, CRNAPre-anesthesia Checklist: Patient identified, Emergency Drugs available, Suction available and Patient being monitored Patient Re-evaluated:Patient Re-evaluated prior to induction Oxygen Delivery Method: Circle System Utilized Preoxygenation: Pre-oxygenation with 100% oxygen Induction Type: IV induction Ventilation: Mask ventilation without difficulty Laryngoscope Size: Miller and 3 Grade View: Grade I Tube type: Oral Tube size: 7.5 mm Number of attempts: 1 Airway Equipment and Method: Stylet and Oral airway Placement Confirmation: ETT inserted through vocal cords under direct vision, positive ETCO2 and breath sounds checked- equal and bilateral Secured at: 21 cm Tube secured with: Tape Dental Injury: Teeth and Oropharynx as per pre-operative assessment

## 2023-02-15 NOTE — Anesthesia Preprocedure Evaluation (Addendum)
Anesthesia Evaluation  Patient identified by MRN, date of birth, ID band Patient awake    Reviewed: Allergy & Precautions, Patient's Chart, lab work & pertinent test results  Airway Mallampati: I  TM Distance: >3 FB Neck ROM: Full    Dental  (+) Missing, Dental Advisory Given   Pulmonary asthma , sleep apnea , former smoker   Pulmonary exam normal        Cardiovascular negative cardio ROS Normal cardiovascular exam     Neuro/Psych  PSYCHIATRIC DISORDERS Anxiety Depression    negative neurological ROS     GI/Hepatic negative GI ROS, Neg liver ROS,,,  Endo/Other    Morbid obesity (SUPER)Patient on GLP-1 Agonist PCOS  Renal/GU negative Renal ROS     Musculoskeletal negative musculoskeletal ROS (+)    Abdominal  (+) + obese  Peds  Hematology  (+) Blood dyscrasia   Anesthesia Other Findings abd pain ele LFTs abnormal biliary tract  Reproductive/Obstetrics Hcg negative                              Anesthesia Physical Anesthesia Plan  ASA: 4  Anesthesia Plan: General   Post-op Pain Management:    Induction: Intravenous  PONV Risk Score and Plan: 4 or greater and Ondansetron, Dexamethasone, Midazolam and Treatment may vary due to age or medical condition  Airway Management Planned: Oral ETT  Additional Equipment:   Intra-op Plan:   Post-operative Plan: Extubation in OR  Informed Consent: I have reviewed the patients History and Physical, chart, labs and discussed the procedure including the risks, benefits and alternatives for the proposed anesthesia with the patient or authorized representative who has indicated his/her understanding and acceptance.     Dental advisory given  Plan Discussed with: CRNA  Anesthesia Plan Comments:         Anesthesia Quick Evaluation

## 2023-02-15 NOTE — Anesthesia Postprocedure Evaluation (Signed)
Anesthesia Post Note  Patient: Karina [name]  Procedure(s) Performed: ENDOSCOPIC RETROGRADE CHOLANGIOPANCREATOGRAPHY (ERCP) WITH PROPOFOL BIOPSY STENT REMOVAL REMOVAL OF STONES SPYGLASS CHOLANGIOSCOPY     Patient location during evaluation: PACU Anesthesia Type: General Level of consciousness: awake Pain management: pain level controlled Vital Signs Assessment: post-procedure vital signs reviewed and stable Respiratory status: spontaneous breathing, nonlabored ventilation and respiratory function stable Cardiovascular status: blood pressure returned to baseline and stable Postop Assessment: no apparent nausea or vomiting Anesthetic complications: no   No notable events documented.  Last Vitals:  Vitals:   02/15/23 1230 02/15/23 1240  BP: 115/66 117/73  Pulse: 61 65  Resp: 12 15  Temp:    SpO2: 100% 100%    Last Pain:  Vitals:   02/15/23 1240  TempSrc:   PainSc: 5                  Kieu Quiggle P Shae Augello

## 2023-02-18 LAB — SURGICAL PATHOLOGY

## 2023-02-19 ENCOUNTER — Other Ambulatory Visit (HOSPITAL_COMMUNITY): Payer: Self-pay

## 2023-02-19 MED ORDER — ALPRAZOLAM 0.5 MG PO TABS
0.5000 mg | ORAL_TABLET | Freq: Every day | ORAL | 0 refills | Status: DC | PRN
  Filled 2023-02-19: qty 30, 30d supply, fill #0

## 2023-02-20 ENCOUNTER — Other Ambulatory Visit: Payer: Self-pay

## 2023-02-20 ENCOUNTER — Encounter (HOSPITAL_COMMUNITY): Payer: Self-pay | Admitting: Gastroenterology

## 2023-02-20 DIAGNOSIS — R1011 Right upper quadrant pain: Secondary | ICD-10-CM | POA: Diagnosis not present

## 2023-02-21 ENCOUNTER — Other Ambulatory Visit (HOSPITAL_COMMUNITY): Payer: Self-pay

## 2023-02-21 ENCOUNTER — Encounter: Payer: Self-pay | Admitting: Hematology and Oncology

## 2023-02-22 ENCOUNTER — Other Ambulatory Visit (HOSPITAL_COMMUNITY): Payer: Self-pay

## 2023-02-22 MED ORDER — PANTOPRAZOLE SODIUM 40 MG PO TBEC
40.0000 mg | DELAYED_RELEASE_TABLET | Freq: Every day | ORAL | 5 refills | Status: DC
  Filled 2023-02-22: qty 30, 30d supply, fill #0

## 2023-02-23 ENCOUNTER — Ambulatory Visit
Admission: RE | Admit: 2023-02-23 | Discharge: 2023-02-23 | Disposition: A | Discharge status: Home / Self Care | Payer: Commercial Managed Care - PPO | Source: Ambulatory Visit | Attending: Orthopedic Surgery | Admitting: Orthopedic Surgery

## 2023-02-23 DIAGNOSIS — M47816 Spondylosis without myelopathy or radiculopathy, lumbar region: Secondary | ICD-10-CM | POA: Diagnosis not present

## 2023-02-23 DIAGNOSIS — M5126 Other intervertebral disc displacement, lumbar region: Secondary | ICD-10-CM | POA: Diagnosis not present

## 2023-02-23 DIAGNOSIS — M5416 Radiculopathy, lumbar region: Secondary | ICD-10-CM

## 2023-02-25 ENCOUNTER — Other Ambulatory Visit (HOSPITAL_COMMUNITY): Payer: Self-pay

## 2023-02-25 DIAGNOSIS — E88819 Insulin resistance, unspecified: Secondary | ICD-10-CM | POA: Diagnosis not present

## 2023-02-25 DIAGNOSIS — D259 Leiomyoma of uterus, unspecified: Secondary | ICD-10-CM | POA: Diagnosis not present

## 2023-02-25 DIAGNOSIS — E282 Polycystic ovarian syndrome: Secondary | ICD-10-CM | POA: Diagnosis not present

## 2023-02-25 DIAGNOSIS — E221 Hyperprolactinemia: Secondary | ICD-10-CM | POA: Diagnosis not present

## 2023-02-25 DIAGNOSIS — D509 Iron deficiency anemia, unspecified: Secondary | ICD-10-CM | POA: Diagnosis not present

## 2023-02-25 DIAGNOSIS — N920 Excessive and frequent menstruation with regular cycle: Secondary | ICD-10-CM | POA: Diagnosis not present

## 2023-02-25 DIAGNOSIS — Z319 Encounter for procreative management, unspecified: Secondary | ICD-10-CM | POA: Diagnosis not present

## 2023-02-25 MED ORDER — CABERGOLINE 0.5 MG PO TABS
0.2500 mg | ORAL_TABLET | ORAL | 4 refills | Status: AC
  Filled 2023-02-25: qty 8, 56d supply, fill #0
  Filled 2023-09-13: qty 8, 56d supply, fill #1

## 2023-02-25 MED ORDER — TRANEXAMIC ACID 650 MG PO TABS
1300.0000 mg | ORAL_TABLET | Freq: Three times a day (TID) | ORAL | 11 refills | Status: AC
  Filled 2023-02-25: qty 30, 5d supply, fill #0
  Filled 2023-04-19: qty 30, 5d supply, fill #1
  Filled 2023-07-04: qty 30, 5d supply, fill #2
  Filled 2023-09-13: qty 30, 5d supply, fill #3
  Filled 2023-11-20: qty 30, 5d supply, fill #4
  Filled 2024-01-31: qty 30, 5d supply, fill #5

## 2023-02-26 ENCOUNTER — Other Ambulatory Visit (HOSPITAL_COMMUNITY): Payer: Self-pay

## 2023-03-01 ENCOUNTER — Ambulatory Visit: Payer: Commercial Managed Care - PPO | Admitting: Cardiovascular Disease

## 2023-03-01 ENCOUNTER — Other Ambulatory Visit (HOSPITAL_COMMUNITY): Payer: Self-pay

## 2023-03-11 ENCOUNTER — Ambulatory Visit: Payer: Commercial Managed Care - PPO | Admitting: Orthopedic Surgery

## 2023-03-11 DIAGNOSIS — M5416 Radiculopathy, lumbar region: Secondary | ICD-10-CM

## 2023-03-11 NOTE — Progress Notes (Signed)
Orthopedic Spine Surgery Office Note   Assessment: Patient is a 42 y.o. female with progressive worsening of her chronic low back pain with right-sided radicular symptoms. 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  She would not be a candidate for elective spine surgery until she gets down to a weight of 220 pounds -Told her to work on core strengthening as well to help with her pain -Patient should return to office in 3 weeks, x-rays at next visit: AP/lateral/flex/ex cervical     Patient expressed understanding of the plan and all questions were answered to the patient's satisfaction.    ___________________________________________________________________________     History:   Patient is a 42 y.o. female who presents today for routine follow-up on her lumbar spine.  She still states that she is having low back pain that is similar to the last time I saw her.  She has pain that radiates along the posterior aspect of thigh and the lateral aspect of the leg.  This pain is on the right side.  She has not had any pain radiating to the left lower extremity.  Pain radiating to her leg has not changed since last time I saw her.  She still feels pain on a daily basis and feels that it is interfering with her ability to do things as simple as playing with her daughter or dancing.     Treatments tried: muscle relaxers, PT, NSAIDs, Tylenol    Physical Exam:     General: no acute distress, appears stated age Neurologic: alert, answering questions appropriately, following commands Respiratory: unlabored breathing on room air, symmetric chest rise Psychiatric: appropriate affect, normal cadence to speech     MSK (spine):   -Strength exam                                                    Left                  Right EHL                              5/5                  5/5 TA                                 5/5                  5/5 GSC                             5/5                  5/5 Knee extension            5/5                  5/5 Hip flexion  5/5                  5/5   -Sensory exam                           Sensation intact to light touch in L3-S1 nerve distributions of bilateral lower extremities   -Straight leg raise: Negative bilaterally -Femoral nerve stretch test: Negative bilaterally -Clonus: no beats bilaterally     Imaging: XR of the lumbar spine from 01/11/2023 was previously independently reviewed and interpreted, showing disc height loss with anterior osteophyte formation at L3/4 and L4/5.  No evidence of instability on flexion/extension views.  No fracture or dislocation seen.  MRI of the lumbar spine from 02/23/2023 was independently reviewed and interpreted, showing DDD at L3/4 and L4/5.  Central and right paracentral disc herniation at L3/4.  Small central disc herniation at L5/S1.     Patient name: Karina [name] Patient MRN: [id number] Date of visit: 03/11/23

## 2023-03-13 ENCOUNTER — Other Ambulatory Visit (HOSPITAL_COMMUNITY): Payer: Self-pay

## 2023-03-18 DIAGNOSIS — K760 Fatty (change of) liver, not elsewhere classified: Secondary | ICD-10-CM | POA: Diagnosis not present

## 2023-03-18 DIAGNOSIS — R1011 Right upper quadrant pain: Secondary | ICD-10-CM | POA: Diagnosis not present

## 2023-03-18 DIAGNOSIS — R1013 Epigastric pain: Secondary | ICD-10-CM | POA: Diagnosis not present

## 2023-03-19 ENCOUNTER — Other Ambulatory Visit (HOSPITAL_COMMUNITY): Payer: Self-pay

## 2023-03-19 DIAGNOSIS — Z0000 Encounter for general adult medical examination without abnormal findings: Secondary | ICD-10-CM | POA: Diagnosis not present

## 2023-03-19 MED ORDER — TIZANIDINE HCL 2 MG PO TABS
2.0000 mg | ORAL_TABLET | Freq: Three times a day (TID) | ORAL | 2 refills | Status: AC | PRN
  Filled 2023-03-19: qty 60, 10d supply, fill #0

## 2023-03-19 MED ORDER — OMEPRAZOLE 40 MG PO CPDR
40.0000 mg | DELAYED_RELEASE_CAPSULE | Freq: Two times a day (BID) | ORAL | 1 refills | Status: AC
  Filled 2023-03-19: qty 180, 90d supply, fill #0
  Filled 2023-09-13: qty 180, 90d supply, fill #1

## 2023-03-19 MED ORDER — VENTOLIN HFA 108 (90 BASE) MCG/ACT IN AERS
2.0000 | INHALATION_SPRAY | RESPIRATORY_TRACT | 11 refills | Status: AC | PRN
  Filled 2023-03-19: qty 18, 17d supply, fill #0
  Filled 2023-11-20: qty 18, 25d supply, fill #1
  Filled 2023-11-20: qty 18, 17d supply, fill #1

## 2023-03-19 MED ORDER — ALPRAZOLAM 0.5 MG PO TABS
0.5000 mg | ORAL_TABLET | Freq: Every day | ORAL | 0 refills | Status: DC | PRN
  Filled 2023-03-19 – 2023-07-16 (×2): qty 30, 30d supply, fill #0

## 2023-03-19 MED ORDER — VITAMIN D (ERGOCALCIFEROL) 1.25 MG (50000 UNIT) PO CAPS
50000.0000 [IU] | ORAL_CAPSULE | ORAL | 1 refills | Status: AC
  Filled 2023-03-19: qty 12, 84d supply, fill #0
  Filled 2023-09-13: qty 12, 84d supply, fill #1

## 2023-03-19 MED ORDER — URSODIOL 250 MG PO TABS
250.0000 mg | ORAL_TABLET | Freq: Two times a day (BID) | ORAL | 2 refills | Status: AC
  Filled 2023-03-19: qty 60, 30d supply, fill #0
  Filled 2023-09-13: qty 60, 30d supply, fill #1
  Filled 2023-11-20: qty 60, 30d supply, fill #2

## 2023-03-20 ENCOUNTER — Other Ambulatory Visit (HOSPITAL_COMMUNITY): Payer: Self-pay

## 2023-03-20 ENCOUNTER — Encounter: Payer: Self-pay | Admitting: Hematology and Oncology

## 2023-04-04 ENCOUNTER — Ambulatory Visit: Payer: Commercial Managed Care - PPO | Admitting: Orthopedic Surgery

## 2023-04-05 DIAGNOSIS — E221 Hyperprolactinemia: Secondary | ICD-10-CM | POA: Diagnosis not present

## 2023-04-05 DIAGNOSIS — Z0000 Encounter for general adult medical examination without abnormal findings: Secondary | ICD-10-CM | POA: Diagnosis not present

## 2023-04-05 DIAGNOSIS — Z319 Encounter for procreative management, unspecified: Secondary | ICD-10-CM | POA: Diagnosis not present

## 2023-04-17 ENCOUNTER — Other Ambulatory Visit (HOSPITAL_COMMUNITY): Payer: Self-pay | Admitting: Gastroenterology

## 2023-04-17 ENCOUNTER — Other Ambulatory Visit (HOSPITAL_COMMUNITY): Payer: Self-pay

## 2023-04-17 DIAGNOSIS — K769 Liver disease, unspecified: Secondary | ICD-10-CM

## 2023-04-17 MED ORDER — PREDNISONE 10 MG PO TABS
40.0000 mg | ORAL_TABLET | Freq: Every day | ORAL | 3 refills | Status: AC
  Filled 2023-04-17: qty 120, 30d supply, fill #0
  Filled 2023-09-13: qty 120, 30d supply, fill #1
  Filled 2023-11-20: qty 120, 30d supply, fill #2

## 2023-04-19 ENCOUNTER — Other Ambulatory Visit (HOSPITAL_COMMUNITY): Payer: Self-pay

## 2023-04-23 DIAGNOSIS — H524 Presbyopia: Secondary | ICD-10-CM | POA: Diagnosis not present

## 2023-04-24 ENCOUNTER — Other Ambulatory Visit (HOSPITAL_COMMUNITY): Payer: Self-pay

## 2023-04-24 MED ORDER — LORAZEPAM 1 MG PO TABS
1.0000 mg | ORAL_TABLET | Freq: Once | ORAL | 0 refills | Status: AC
  Filled 2023-04-24: qty 1, 1d supply, fill #0

## 2023-04-26 ENCOUNTER — Ambulatory Visit: Payer: Self-pay

## 2023-04-26 ENCOUNTER — Ambulatory Visit (HOSPITAL_COMMUNITY)
Admission: RE | Admit: 2023-04-26 | Discharge: 2023-04-26 | Disposition: A | Discharge status: Home / Self Care | Payer: Commercial Managed Care - PPO | Source: Ambulatory Visit | Attending: Gastroenterology | Admitting: Gastroenterology

## 2023-04-26 ENCOUNTER — Ambulatory Visit (INDEPENDENT_AMBULATORY_CARE_PROVIDER_SITE_OTHER): Payer: Commercial Managed Care - PPO

## 2023-04-26 ENCOUNTER — Ambulatory Visit: Payer: Commercial Managed Care - PPO | Admitting: Orthopedic Surgery

## 2023-04-26 DIAGNOSIS — R1011 Right upper quadrant pain: Secondary | ICD-10-CM | POA: Diagnosis not present

## 2023-04-26 DIAGNOSIS — M546 Pain in thoracic spine: Secondary | ICD-10-CM

## 2023-04-26 DIAGNOSIS — M542 Cervicalgia: Secondary | ICD-10-CM

## 2023-04-26 DIAGNOSIS — K769 Liver disease, unspecified: Secondary | ICD-10-CM | POA: Diagnosis not present

## 2023-04-26 DIAGNOSIS — R932 Abnormal findings on diagnostic imaging of liver and biliary tract: Secondary | ICD-10-CM | POA: Diagnosis not present

## 2023-04-26 DIAGNOSIS — K7689 Other specified diseases of liver: Secondary | ICD-10-CM | POA: Diagnosis not present

## 2023-04-26 MED ORDER — GADOBUTROL 1 MMOL/ML IV SOLN
10.0000 mL | Freq: Once | INTRAVENOUS | Status: AC | PRN
  Administered 2023-04-26: 10 mL via INTRAVENOUS

## 2023-04-26 NOTE — Progress Notes (Signed)
Orthopedic Spine Surgery Office Note  Assessment: Patient is a 42 y.o. female with pain at the cervicothoracic junction especially with extension, suspect facetogenic in nature. No radicular pain or symptoms of myelopathy.    Plan: -Explained that initially conservative treatment is tried as a significant number of patients may experience relief with these treatment modalities. Discussed that the conservative treatments include:  -activity modification  -physical therapy  -over the counter pain medications  -medrol dosepak  -cervical steroid injections -Patient has tried no specific treatments -Recommended heating, Tylenol up to 1000 mg 3 times a day, topical treatments (including lidocaine patches, lidocaine gel, Voltaren gel) -I also told her she could try acupuncture or chiropractor.  I explained that there is no scientific evidence to backup these treatments but some patients do find relief with these treatments -We did not talk about her lumbar spine today, but she should get the diagnostic/therapeutic injection that we talked about last time in the next couple of weeks -Patient should return to office on an as-needed basis   Patient expressed understanding of the plan and all questions were answered to the patient's satisfaction.   ___________________________________________________________________________   History:  Patient is a 42 y.o. female who presents today for cervical spine.  Patient has had lower cervical and upper thoracic back pain for approximately 1.5 years now.  She does not have any pain radiating to either upper extremity.  She notes the pain is worse with motion at the neck.  She says it is worse with rotation and extension.  There is no trauma or injury that preceded the onset of pain.  She also notices a cracking and popping sensation in her neck with the extremes of motion.  She has not tried any specific treatment so far.  She has not noticed any unsteadiness  with gait.  No trouble with fine motor skills in the hands.    Treatments tried: No specific treatments tried for her neck so far  Physical Exam:  General: no acute distress, appears stated age Neurologic: alert, answering questions appropriately, following commands Respiratory: unlabored breathing on room air, symmetric chest rise Psychiatric: appropriate affect, normal cadence to speech   MSK (spine):  -Strength exam      Left  Right Grip strength                5/5  5/5 Interosseus   5/5   5/5 Wrist extension  5/5  5/5 Wrist flexion   5/5  5/5 Elbow flexion   5/5  5/5 Deltoid    5/5  5/5  -Sensory exam    Sensation intact to light touch in C5-T1 nerve distributions of bilateral upper extremities  -Brachioradialis DTR: 2/4 on the left, 2/4 on the right -Biceps DTR: 2/4 on the left, 2/4 on the right  -Spurling: Negative bilaterally -Hoffman sign: Negative bilaterally -Clonus: No beats bilaterally -Interosseous wasting: None seen -Grip and release test: Negative -Romberg: Negative -Gait: Normal  Neck taken through range of motion passively with pain most significant with extension  Left shoulder exam: No pain through range of motion, negative Jobe, no weakness with external rotation with arm at side, negative belly press Right shoulder exam: No pain through range of motion, negative Jobe, no weakness with external rotation with arm at side, negative belly press  Imaging: XR of the cervical spine from 04/26/2023 was independently reviewed and interpreted, showing disc height loss at C5/6. Joint space narrowing in the lower cervical facet joints.No evidence of instability on flexion/extension  views. No fracture or dislocation seen.   XR of the thoracic spine from 04/26/2023 was independently reviewed and interpreted, showing physiologic thoracic kyphosis. No fracture or dislocation seen. No significant degenerative changes.    Patient name: Karina [name] Patient MRN: [id number] Date of visit: 04/26/23

## 2023-04-29 ENCOUNTER — Other Ambulatory Visit (HOSPITAL_COMMUNITY): Payer: Self-pay | Admitting: Gastroenterology

## 2023-04-29 DIAGNOSIS — R748 Abnormal levels of other serum enzymes: Secondary | ICD-10-CM | POA: Diagnosis not present

## 2023-04-29 DIAGNOSIS — K769 Liver disease, unspecified: Secondary | ICD-10-CM

## 2023-04-29 DIAGNOSIS — K5900 Constipation, unspecified: Secondary | ICD-10-CM | POA: Diagnosis not present

## 2023-05-08 ENCOUNTER — Other Ambulatory Visit (HOSPITAL_COMMUNITY): Payer: Self-pay | Admitting: Gastroenterology

## 2023-05-08 DIAGNOSIS — K760 Fatty (change of) liver, not elsewhere classified: Secondary | ICD-10-CM

## 2023-05-08 DIAGNOSIS — R748 Abnormal levels of other serum enzymes: Secondary | ICD-10-CM

## 2023-05-09 DIAGNOSIS — E221 Hyperprolactinemia: Secondary | ICD-10-CM | POA: Diagnosis not present

## 2023-05-09 DIAGNOSIS — E88819 Insulin resistance, unspecified: Secondary | ICD-10-CM | POA: Diagnosis not present

## 2023-05-09 DIAGNOSIS — E049 Nontoxic goiter, unspecified: Secondary | ICD-10-CM | POA: Diagnosis not present

## 2023-05-14 DIAGNOSIS — E221 Hyperprolactinemia: Secondary | ICD-10-CM | POA: Diagnosis not present

## 2023-05-14 DIAGNOSIS — E049 Nontoxic goiter, unspecified: Secondary | ICD-10-CM | POA: Diagnosis not present

## 2023-05-14 DIAGNOSIS — E559 Vitamin D deficiency, unspecified: Secondary | ICD-10-CM | POA: Diagnosis not present

## 2023-05-15 ENCOUNTER — Other Ambulatory Visit: Payer: Self-pay | Admitting: Endocrinology

## 2023-05-15 DIAGNOSIS — E049 Nontoxic goiter, unspecified: Secondary | ICD-10-CM

## 2023-05-17 ENCOUNTER — Other Ambulatory Visit: Payer: Self-pay | Admitting: Endocrinology

## 2023-05-17 DIAGNOSIS — E221 Hyperprolactinemia: Secondary | ICD-10-CM

## 2023-05-28 ENCOUNTER — Ambulatory Visit
Admission: RE | Admit: 2023-05-28 | Discharge: 2023-05-28 | Disposition: A | Discharge status: Home / Self Care | Payer: Commercial Managed Care - PPO | Source: Ambulatory Visit | Attending: Endocrinology | Admitting: Endocrinology

## 2023-05-28 DIAGNOSIS — E049 Nontoxic goiter, unspecified: Secondary | ICD-10-CM

## 2023-06-16 ENCOUNTER — Other Ambulatory Visit (HOSPITAL_COMMUNITY): Payer: Self-pay

## 2023-06-21 DIAGNOSIS — K760 Fatty (change of) liver, not elsewhere classified: Secondary | ICD-10-CM | POA: Diagnosis not present

## 2023-06-21 DIAGNOSIS — R748 Abnormal levels of other serum enzymes: Secondary | ICD-10-CM | POA: Diagnosis not present

## 2023-06-25 ENCOUNTER — Other Ambulatory Visit: Payer: Self-pay | Admitting: Radiology

## 2023-06-25 DIAGNOSIS — R7989 Other specified abnormal findings of blood chemistry: Secondary | ICD-10-CM

## 2023-06-25 NOTE — Progress Notes (Signed)
Chief Complaint: Patient was seen in consultation today for image guided random core liver biopsy  Referring Physician(s): Hung,Patrick  Supervising Physician: Mir, Mauri Reading  Patient Status: [facility] - Out-pt  History of Present Illness: Karina [name] is a 42 y.o. female with past medical history significant for anemia, anxiety, asthma, hemoglobin C trait, prediabetes, sleep apnea, prior cholecystectomy, choledocholithiasis who presents now with elevated LFTs/elevated ASMA and recent imaging findings suggestive of cirrhosis, cholangitis.  She is scheduled today for image guided random core liver biopsy for further evaluation.  Past Medical History:  Diagnosis Date   Anemia    takes iron daily   Anxiety    no meds   Asthma    albuterol as needed   Chronic bronchitis    bronchitis x 1 event in her twenties   Complication of anesthesia    decreased respirations with lap band sx   Dysrhythmia     " arrhythmia during endoscopy procedure "    Edema extremities    hx of edema in lower ext.used to take lasix - none since twenties   Gallbladder disease    GERD (gastroesophageal reflux disease)    nexium    Hemoglobin C trait (HCC)    History of chicken pox    HSV (herpes simplex virus) anogenital infection    Neuropathy    feet   Pre-diabetes    Pregnant    svd 06/19/17   Sleep apnea    Does not use cpap nightly   SVD (spontaneous vaginal delivery)    x 2   Wears glasses     Past Surgical History:  Procedure Laterality Date   BILIARY STENT PLACEMENT N/A 08/29/2022   Procedure: BILIARY STENT PLACEMENT;  Surgeon: Jeani Hawking, MD;  Location: [facility];  Service: Gastroenterology;  Laterality: N/A;   BIOPSY  07/14/2020   Procedure: BIOPSY;  Surgeon: Charna Elizabeth, MD;  [facility]: [facility];  Service: Endoscopy;;   BIOPSY  02/15/2023   Procedure: BIOPSY;  Surgeon: Jeani Hawking, MD;  Location: [facility];  Service: Gastroenterology;;   Fidela Salisbury RELEASE  Bilateral 2008, 2010   CHOLECYSTECTOMY  01/11/2012   Procedure: LAPAROSCOPIC CHOLECYSTECTOMY WITH INTRAOPERATIVE CHOLANGIOGRAM;  Surgeon: Cherylynn Ridges, MD;  Location: [facility] OR;  Service: General;  Laterality: N/A;   COLONOSCOPY     COLONOSCOPY WITH PROPOFOL N/A 07/14/2020   Procedure: COLONOSCOPY WITH PROPOFOL;  Surgeon: Charna Elizabeth, MD;  [facility];  Service: Endoscopy;  Laterality: N/A;   DILATION AND EVACUATION N/A 03/24/2020   Procedure: DILATATION AND EVACUATION;  Surgeon: Hoover Browns, MD;  Location: [facility] OR;  Service: Gynecology;  Laterality: N/A;   ENDOSCOPIC RETROGRADE CHOLANGIOPANCREATOGRAPHY (ERCP) WITH PROPOFOL N/A 08/29/2022   Procedure: ENDOSCOPIC RETROGRADE CHOLANGIOPANCREATOGRAPHY (ERCP) WITH PROPOFOL;  Surgeon: Jeani Hawking, MD;  [facility];  Service: Gastroenterology;  Laterality: N/A;   ENDOSCOPIC RETROGRADE CHOLANGIOPANCREATOGRAPHY (ERCP) WITH PROPOFOL N/A 02/15/2023   Procedure: ENDOSCOPIC RETROGRADE CHOLANGIOPANCREATOGRAPHY (ERCP) WITH PROPOFOL;  Surgeon: Jeani Hawking, MD;  [facility];  Service: Gastroenterology;  Laterality: N/A;   ERCP N/A 08/22/2022   Procedure: ENDOSCOPIC RETROGRADE CHOLANGIOPANCREATOGRAPHY (ERCP);  Surgeon: Jeani Hawking, MD;  Location: [facility];  Service: Gastroenterology;  Laterality: N/A;   LAPAROSCOPIC BILATERAL SALPINGECTOMY Right 08/14/2017   Procedure: LAPAROSCOPIC RIGHT  SALPINGECTOMY;  Surgeon: Silverio Lay, MD;  Location: [facility];  Service: Gynecology;  Laterality: Right;   LAPAROSCOPIC GASTRIC BANDING     REMOVAL OF STONES  08/22/2022   Procedure: REMOVAL OF STONES;  Surgeon:  Jeani Hawking, MD;  Location: [facility];  Service: Gastroenterology;;   REMOVAL OF STONES  08/29/2022   Procedure: REMOVAL OF STONES;  Surgeon: Jeani Hawking, MD;  Location: [facility];  Service: Gastroenterology;;   REMOVAL OF STONES  02/15/2023   Procedure: REMOVAL OF STONES;  Surgeon: Jeani Hawking, MD;  Location: [facility];   Service: Gastroenterology;;   Dennison Mascot  08/22/2022   Procedure: Dennison Mascot;  Surgeon: Jeani Hawking, MD;  Location: [facility];  Service: Gastroenterology;;   Burman Freestone CHOLANGIOSCOPY N/A 08/29/2022   Procedure: ZOXWRUEA CHOLANGIOSCOPY;  Surgeon: Jeani Hawking, MD;  [facility];  Service: Gastroenterology;  Laterality: N/A;   SPYGLASS CHOLANGIOSCOPY N/A 02/15/2023   Procedure: VWUJWJXB CHOLANGIOSCOPY;  Surgeon: Jeani Hawking, MD;  [facility];  Service: Gastroenterology;  Laterality: N/A;   SPYGLASS LITHOTRIPSY N/A 08/29/2022   Procedure: JYNWGNFA LITHOTRIPSY;  Surgeon: Jeani Hawking, MD;  [facility];  Service: Gastroenterology;  Laterality: N/A;   STENT REMOVAL  02/15/2023   Procedure: STENT REMOVAL;  Surgeon: Jeani Hawking, MD;  Location: [facility];  Service: Gastroenterology;;   STONE EXTRACTION WITH BASKET  08/29/2022   Procedure: STONE EXTRACTION WITH BASKET;  Surgeon: Jeani Hawking, MD;  Location: [facility];  Service: Gastroenterology;;   UPPER GI ENDOSCOPY     WISDOM TOOTH EXTRACTION      Allergies: Patient has no known allergies.  Medications: Prior to Admission medications   Medication Sig Start Date End Date Taking? Authorizing Provider  ALPRAZolam Prudy Feeler) 0.5 MG tablet Take 1 tablet (0.5 mg total) by mouth daily as needed for anxiety 03/19/23     B Complex-C (B-COMPLEX WITH VITAMIN C) tablet Take 1 tablet by mouth daily. Patient not taking: Reported on 02/13/2023 08/23/22   Rolly Salter, MD  cabergoline (DOSTINEX) 0.5 MG tablet Take 1/2 tablet (0.25 mg total) by mouth 2 (two) times a week. 02/25/23     Calcipotriene-Betameth Diprop (ENSTILAR) 0.005-0.064 % FOAM Apply 1 Application topically daily as needed (eczema). 04/11/22   [provider]  Cholecalciferol 1.25 MG (50000 UT) capsule Take 1 capsule (50,000 Units total) by mouth once a week. 09/21/22     furosemide (LASIX) 20 MG tablet Take 1 tablet (20 mg total) by mouth  every other day. Patient not taking: Reported on 02/13/2023 03/16/22     imiquimod (ALDARA) 5 % cream Apply topically 3 (three) times a week at bedtime until warts completely disappear up to a maximum of 12 weeks. Patient taking differently: Apply 1 Application topically at bedtime as needed (warts). Up to 3 times a week at bedtime 03/16/22     Insulin Pen Needle (TECHLITE PEN NEEDLES) 31G X 5 MM MISC Use 1 pen needle daily to inject saxenda 10/19/22   Podraza, Rudy Jew, PA-C  Liraglutide -Weight Management (SAXENDA) 18 MG/3ML SOPN Inject 0.6 mg into the skin daily for 30 days, THEN 1.2 mg daily for 30 days, THEN 1.8 mg daily for 30 days. Patient taking differently: Inject 1.8 mg into the skin daily. 09/27/22     metFORMIN (GLUCOPHAGE-XR) 500 MG 24 hr tablet Take 2 tablets (1,000 mg total) by mouth daily. 02/11/23     omeprazole (PRILOSEC) 40 MG capsule Take 1 capsule (40 mg total) by mouth 2 (two) times daily. 03/19/23     pantoprazole (PROTONIX) 40 MG tablet Take 1 tablet (40 mg total) by mouth daily. Patient not taking: Reported on 02/13/2023 08/23/22   Rolly Salter, MD  pantoprazole (PROTONIX) 40 MG tablet Take 1 tablet (40 mg total)  by mouth daily. 02/22/23     predniSONE (DELTASONE) 10 MG tablet Take 4 tablets (40 mg total) by mouth daily. 04/17/23     Semaglutide-Weight Management (WEGOVY) 0.25 MG/0.5ML SOAJ Inject 0.25 mg into the skin once a week. Patient not taking: Reported on 02/13/2023 09/21/22     Semaglutide-Weight Management (WEGOVY) 0.5 MG/0.5ML SOAJ Inject 0.5 mg into the skin once a week. Patient not taking: Reported on 02/13/2023 12/19/22     tiZANidine (ZANAFLEX) 2 MG tablet Take 1-2 tablets (2-4 mg total) by mouth 3 (three) times daily as needed for muscle spasms 03/19/23     tranexamic acid (LYSTEDA) 650 MG TABS tablet Take 2 tablets (1,300 mg total) by mouth 3 (three) times daily for 5 days. 02/25/23     ursodiol (ACTIGALL) 250 MG tablet Take 1 tablet (250 mg total) by mouth 2 (two) times  daily. 03/19/23     VENTOLIN HFA 108 (90 Base) MCG/ACT inhaler Inhale 2 puffs into the lungs every 4 (four) hours as needed for wheezing 03/19/23     Vitamin D, Ergocalciferol, (DRISDOL) 1.25 MG (50000 UNIT) CAPS capsule Take 1 capsule (50,000 Units total) by mouth every 7 (seven) days. 03/19/23        Family History  Problem Relation Age of Onset   Heart attack Mother    Stroke Mother    Hypertension Mother    Anesthesia problems Mother    Hypertension Father    Diabetes Sister    Asthma Sister    Hypertension Brother    Asthma Maternal Grandmother    Stroke Maternal Grandfather    Liver cancer Paternal Grandfather    Prostate cancer Paternal Grandfather    Ovarian cancer Maternal Aunt    Cervical cancer Maternal Aunt        cervial and thyroid   Prostate cancer Maternal Uncle    Liver cancer Paternal Uncle    Heart disease Other    Hyperlipidemia Other    Hypertension Other    Stroke Other    Kidney disease Other    Thyroid disease Other    Hypotension Neg Hx    Malignant hyperthermia Neg Hx    Pseudochol deficiency Neg Hx     Social History   Socioeconomic History   Marital status: Married    Spouse name: Not on file   Number of children: 1   Years of education: college   Highest education level: Not on file  Occupational History   Not on file  Tobacco Use   Smoking status: Former    Current packs/day: 0.00    Average packs/day: 0.3 packs/day for 1 year (0.3 ttl pk-yrs)    Types: Cigars, Cigarettes    Start date: 09/10/1994    Quit date: 09/11/1995    Years since quitting: 27.8   Smokeless tobacco: Never  Vaping Use   Vaping status: Never Used  Substance and Sexual Activity   Alcohol use: Not Currently    Comment: socially-none since pregnancy - SVD 06/19/17   Drug use: No   Sexual activity: Not Currently    Birth control/protection: None    Comment: 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   Overall Financial Resource Strain (CARDIA)    Difficulty of Paying Living Expenses: Not hard at all  Food Insecurity: Low Risk  (10/30/2022)   [facility]   Hunger Vital Sign    Worried About Running Out of Food in the Last Year: Never true    Within the past 12 months, the food you bought just didn't last and you didn't have money to get more: Not on file  Transportation Needs: No Transportation Needs (10/30/2022)   [facility]   Transportation    In the past 12 months, has lack of reliable transportation kept you from medical appointments, meetings, work or from getting things needed for daily living? : No  Physical Activity: Unknown (03/16/2022)   [facility], [facility] prior to 11/10/2022., [facility] visits prior to 11/10/2022., [facility] visits prior to 11/10/2022.   Exercise Vital Sign    Days of Exercise per Week: 0 days    Minutes of Exercise per Session: Not on file  Stress: Not on file  Social Connections: Unknown (03/16/2022)   [facility], [facility] visits prior to 11/10/2022., [facility] visits prior to 11/10/2022., [facility] visits prior to 11/10/2022.   Social Advertising account executive [NHANES]    Frequency of Communication with Friends and Family: More than three times a week    Frequency of Social Gatherings with Friends and Family: Not on file    Attends  Religious Services: Not on file    Active Member of Clubs or Organizations: Not on file    Attends Banker Meetings: Not on file    Marital Status: Married      Review of Systems  Vital Signs:  Code Status:     Physical Exam  Imaging: US THYROID  Result Date: 05/29/2023 CLINICAL DATA:  Goiter. EXAM: THYROID ULTRASOUND TECHNIQUE: Ultrasound examination of the thyroid gland and adjacent soft tissues was performed. COMPARISON:  September 2022 FINDINGS: Parenchymal Echotexture: Mildly heterogenous Isthmus: 0.2 cm Right lobe: 4.6 x 1.3 x 1.2 cm Left lobe: 4.3 x 1.4 x 1.3 cm _________________________________________________________ Estimated total number of nodules >/= 1 cm: 0 Number of spongiform nodules >/=  2 cm not described below (TR1): 0 Number of mixed cystic and solid nodules >/= 1.5 cm not described below (TR2): 0 _________________________________________________________ No discrete nodules are seen within the thyroid gland. IMPRESSION: Normal size of the thyroid gland.  No discrete thyroid nodule. Electronically Signed   By: Olive Bass M.D.   On: 05/29/2023 16:24    Labs:  CBC: Recent Labs    08/19/22 1429 08/20/22 1142 08/21/22 1109 08/22/22 0435  WBC 13.8* 10.0 9.3 10.4  HGB 11.1* 11.0* 10.8* 9.8*  HCT 33.9* 34.2* 33.4* 30.2*  PLT 353 331 330 312    COAGS: No results for input(s): "INR", "APTT" in the last 8760 hours.  BMP: Recent Labs    08/20/22 0444 08/21/22 1109 08/22/22 0435 08/23/22 0437  NA 134* 130* 135 134*  K 3.5 5.2* 3.8 4.4  CL 106 104 106 109  CO2 21* 22 24 21*  GLUCOSE 109* 97 83 142*  BUN 10 10 11 13   CALCIUM 8.5* 8.4* 8.3*  8.5*  CREATININE 0.74 0.69 0.87 0.73  GFRNONAA >60 >60 >60 >60    LIVER FUNCTION TESTS: Recent Labs    07/05/22 2339 08/21/22 1109 08/22/22 0435 08/23/22 0437  BILITOT 0.9 3.7*  3.9* 1.8* 1.2  AST 54* 153*  151* 107* 90*  ALT 44 162*  164* 135* 127*  ALKPHOS 233* 340*  339* 285* 292*   PROT 8.5* 8.1  9.0* 7.8 8.3*  ALBUMIN 2.7* 2.5*  2.8* 2.5* 2.6*    TUMOR MARKERS: No results for input(s): "AFPTM", "CEA", "CA199", "CHROMGRNA" in the last 8760 hours.  Assessment and Plan: 42 y.o. female with past medical history significant for anemia, anxiety, asthma, hemoglobin C trait, prediabetes, sleep apnea, prior cholecystectomy, choledocholithiasis who presents now with elevated LFTs/elevated ASMA and recent imaging findings suggestive of cirrhosis, cholangitis.  She is scheduled today for image guided random core liver biopsy for further evaluation.Risks and benefits of *** was discussed with the patient and/or patient's family including, but not limited to bleeding, infection, damage to adjacent structures or low yield requiring additional tests.  All of the questions were answered and there is agreement to proceed.  Consent signed and in chart.    Thank you for this interesting consult.  I greatly enjoyed meeting Karina [name] and look forward to participating in their care.  A copy of this report was sent to the requesting provider on this date.  Electronically Signed: D. Jeananne Rama, PA-C 06/25/2023, 5:08 PM   I spent a total of  25 minutes   in face to face in clinical consultation, greater than 50% of which was counseling/coordinating care for image guided random core liver biopsy

## 2023-06-26 ENCOUNTER — Ambulatory Visit (HOSPITAL_COMMUNITY)
Admission: RE | Admit: 2023-06-26 | Discharge: 2023-06-26 | Disposition: A | Discharge status: Home / Self Care | Payer: Commercial Managed Care - PPO | Source: Ambulatory Visit | Attending: Gastroenterology

## 2023-06-26 ENCOUNTER — Encounter (HOSPITAL_COMMUNITY): Payer: Self-pay

## 2023-06-26 ENCOUNTER — Other Ambulatory Visit: Payer: Self-pay

## 2023-06-26 ENCOUNTER — Ambulatory Visit (HOSPITAL_COMMUNITY)
Admission: RE | Admit: 2023-06-26 | Discharge: 2023-06-26 | Disposition: A | Discharge status: Home / Self Care | Payer: Commercial Managed Care - PPO | Source: Ambulatory Visit | Attending: Gastroenterology | Admitting: Gastroenterology

## 2023-06-26 DIAGNOSIS — R7303 Prediabetes: Secondary | ICD-10-CM | POA: Diagnosis not present

## 2023-06-26 DIAGNOSIS — K760 Fatty (change of) liver, not elsewhere classified: Secondary | ICD-10-CM | POA: Insufficient documentation

## 2023-06-26 DIAGNOSIS — G4730 Sleep apnea, unspecified: Secondary | ICD-10-CM | POA: Diagnosis not present

## 2023-06-26 DIAGNOSIS — R7989 Other specified abnormal findings of blood chemistry: Secondary | ICD-10-CM | POA: Insufficient documentation

## 2023-06-26 DIAGNOSIS — Z9049 Acquired absence of other specified parts of digestive tract: Secondary | ICD-10-CM | POA: Diagnosis not present

## 2023-06-26 DIAGNOSIS — K739 Chronic hepatitis, unspecified: Secondary | ICD-10-CM | POA: Insufficient documentation

## 2023-06-26 DIAGNOSIS — J4489 Other specified chronic obstructive pulmonary disease: Secondary | ICD-10-CM | POA: Diagnosis not present

## 2023-06-26 DIAGNOSIS — F419 Anxiety disorder, unspecified: Secondary | ICD-10-CM | POA: Diagnosis not present

## 2023-06-26 DIAGNOSIS — R748 Abnormal levels of other serum enzymes: Secondary | ICD-10-CM

## 2023-06-26 LAB — COMPREHENSIVE METABOLIC PANEL
ALT: 34 U/L (ref 0–44)
AST: 39 U/L (ref 15–41)
Albumin: 3.5 g/dL (ref 3.5–5.0)
Alkaline Phosphatase: 276 U/L — ABNORMAL HIGH (ref 38–126)
Anion gap: 9 (ref 5–15)
BUN: 15 mg/dL (ref 6–20)
CO2: 23 mmol/L (ref 22–32)
Calcium: 8.9 mg/dL (ref 8.9–10.3)
Chloride: 103 mmol/L (ref 98–111)
Creatinine, Ser: 0.9 mg/dL (ref 0.44–1.00)
GFR, Estimated: >60 mL/min (ref >60)
Glucose, Bld: 82 mg/dL (ref 70–99)
Potassium: 3.8 mmol/L (ref 3.5–5.1)
Sodium: 135 mmol/L (ref 135–145)
Total Bilirubin: 1.6 mg/dL — ABNORMAL HIGH (ref 0.3–1.2)
Total Protein: 9.4 g/dL — ABNORMAL HIGH (ref 6.5–8.1)

## 2023-06-26 LAB — CBC WITH DIFFERENTIAL/PLATELET
Abs Immature Granulocytes: 0.04 10*3/uL (ref 0.00–0.07)
Basophils Absolute: 0.1 10*3/uL (ref 0.0–0.1)
Basophils Relative: 1 %
Eosinophils Absolute: 0.5 10*3/uL (ref 0.0–0.5)
Eosinophils Relative: 4 %
HCT: 35.7 % — ABNORMAL LOW (ref 36.0–46.0)
Hemoglobin: 11.6 g/dL — ABNORMAL LOW (ref 12.0–15.0)
Immature Granulocytes: 0 %
Lymphocytes Relative: 40 %
Lymphs Abs: 4.9 10*3/uL — ABNORMAL HIGH (ref 0.7–4.0)
MCH: 22.6 pg — ABNORMAL LOW (ref 26.0–34.0)
MCHC: 32.5 g/dL (ref 30.0–36.0)
MCV: 69.6 fL — ABNORMAL LOW (ref 80.0–100.0)
Monocytes Absolute: 0.8 10*3/uL (ref 0.1–1.0)
Monocytes Relative: 7 %
Neutro Abs: 6 10*3/uL (ref 1.7–7.7)
Neutrophils Relative %: 48 %
Platelets: 288 10*3/uL (ref 150–400)
RBC: 5.13 MIL/uL — ABNORMAL HIGH (ref 3.87–5.11)
RDW: 15.3 % (ref 11.5–15.5)
WBC: 12.3 10*3/uL — ABNORMAL HIGH (ref 4.0–10.5)
nRBC: 0 % (ref 0.0–0.2)

## 2023-06-26 LAB — PROTIME-INR
INR: 1 (ref 0.8–1.2)
Prothrombin Time: 13.8 s (ref 11.4–15.2)

## 2023-06-26 MED ORDER — LIDOCAINE HCL 1 % IJ SOLN
INTRAMUSCULAR | Status: AC
  Filled 2023-06-26: qty 20

## 2023-06-26 MED ORDER — FENTANYL CITRATE (PF) 100 MCG/2ML IJ SOLN
INTRAMUSCULAR | Status: AC
  Filled 2023-06-26: qty 2

## 2023-06-26 MED ORDER — SODIUM CHLORIDE 0.9 % IV SOLN: INTRAVENOUS | Status: DC

## 2023-06-26 MED ORDER — FENTANYL CITRATE (PF) 100 MCG/2ML IJ SOLN
INTRAMUSCULAR | Status: AC | PRN
Start: 2023-06-26 — End: 2023-06-26
  Administered 2023-06-26 (×2): 50 ug via INTRAVENOUS

## 2023-06-26 MED ORDER — GELATIN ABSORBABLE 12-7 MM EX MISC
CUTANEOUS | Status: AC
  Filled 2023-06-26: qty 1

## 2023-06-26 MED ORDER — MIDAZOLAM HCL 2 MG/2ML IJ SOLN
INTRAMUSCULAR | Status: AC | PRN
Start: 2023-06-26 — End: 2023-06-26
  Administered 2023-06-26 (×2): 1 mg via INTRAVENOUS

## 2023-06-26 MED ORDER — MIDAZOLAM HCL 2 MG/2ML IJ SOLN
INTRAMUSCULAR | Status: AC
  Filled 2023-06-26: qty 2

## 2023-06-26 NOTE — Procedures (Signed)
Vascular and Interventional Radiology Procedure Note  Patient: Karina [name] DOB: 12/07/1980 Medical Record Number: [id number] Note Date/Time: 06/26/23 1:12 PM   Performing Physician: Roanna Banning, MD Assistant(s): None  Diagnosis: Elevated LFTs   Procedure: LIVER BIOPSY, NON-TARGETED  Anesthesia: Conscious Sedation Complications: None Estimated Blood Loss: Minimal Specimens: Sent for Pathology  Findings:  Successful Ultrasound-guided biopsy of liver. A total of 3 samples were obtained. Hemostasis of the tract was achieved using Manual Pressure.  Plan: Bed rest for 2 hours.  See detailed procedure note with images in PACS. The patient tolerated the procedure well without incident or complication and was returned to Recovery in stable condition.    Roanna Banning, MD Vascular and Interventional Radiology [facility]   Pager. [phone] Clinic. [phone]

## 2023-06-26 NOTE — Discharge Instructions (Addendum)
Discharge Instructions:   Please call Interventional Radiology clinic [phone] with any questions or concerns.  You may remove your  bandaid and shower tomorrow.   Liver Biopsy, Care After The following information offers guidance on how to care for yourself after your procedure. Your health care provider may also give you more specific instructions. If you have problems or questions, contact your health care provider. What can I expect after the procedure? After your procedure, it is common to: Have pain and soreness in the area where the biopsy was done. Have bruising around the area where the biopsy was done. Feel tired for 1-2 days. Follow these instructions at home: Medicines Take over-the-counter and prescription medicines only as told by your health care provider. If you were prescribed an antibiotic medicine, take it as told by your health care provider. Do not stop taking the antibiotic, even if you start to feel better. Do not take medicines, such as aspirin and ibuprofen, unless your doctor tells you to take them. Ask your health care provider if the medicine prescribed to you: Requires you to avoid driving or using machinery. Can cause constipation. You may need to take these actions to prevent or treat constipation: Drink enough fluid to keep your urine pale yellow. Take over-the-counter or prescription medicines. Eat foods that are high in fiber, such as beans, whole grains, and fresh fruits and vegetables. Limit foods that are high in fat and processed sugars, such as fried or sweet foods. Incision care Follow instructions from your health care provider about how to take care of your incisions. Make sure you: Wash your hands with soap and water for at least 20 seconds before and after you change your bandage (dressing). If soap and water are not available, use hand sanitizer. Change your dressing as told by your health care provider. Leave stitches (sutures), skin  glue, or adhesive strips in place. These skin closures may need to stay in place for 2 weeks or longer. If adhesive strip edges start to loosen and curl up, you may trim the loose edges. Do not remove adhesive strips completely unless your health care provider tells you to do that. Check your incision areas every day for signs of infection. Check for: Redness, swelling, or more pain. Fluid or blood. Warmth. Pus or a bad smell. Do not take baths, swim, or use a hot tub until your health care provider says it is okay to do so. Activity Rest at home for 1-2 days, or as directed by your health care provider. Avoid sitting for a long time without moving. Get up to take short walks every 1-2 hours. This is important to improve blood flow and breathing. Ask for help if you feel weak or unsteady. Do not lift anything that is heavier than 10 lb (4.5 kg), or the limit that your health care provider tells you, until he or she says that it is safe. Do not play contact sports for 2 weeks after the procedure. Return to your normal activities as told by your health care provider. Ask your health care provider what activities are safe for you. General instructions  Do not drink alcohol in the first week after the procedure. Plan to have a responsible adult care for you for the time you are told after you leave the hospital or clinic. This is important. It is up to you to get the results of your procedure. Ask your health care provider, or the department that is doing the procedure, when your  results will be ready. Keep all follow-up visits. This is important. Contact a health care provider if: You have increased bleeding from an incision. You have redness, swelling, or increasing pain in any incisions. You notice a discharge or a bad smell coming from any of your incisions. You develop a rash. You have a fever or chills. Get help right away if: You develop swelling, bloating, or pain in your abdomen. You  become dizzy or faint. You have nausea or vomiting. You have shortness of breath or difficulty breathing. You develop chest pain. You have problems with your speech or vision. You have trouble with your balance or moving your arms or legs. These symptoms may represent a serious problem that is an emergency. Do not wait to see if the symptoms will go away. Get medical help right away. Call your local emergency services (911 in the [place] Do not drive yourself to the hospital. Summary After the liver biopsy, it is common to have pain, soreness, and bruising in the area, as well as tiredness (fatigue). Take over-the-counter and prescription medicines only as told by your health care provider. Follow instructions from your health care provider about how to care for your incisions. Check your incision areas daily for signs of infection. This information is not intended to replace advice given to you by your health care provider. Make sure you discuss any questions you have with your health care provider. Document Revised: 07/11/2020 Document Reviewed: 07/11/2020 Elsevier Patient Education  2023 Elsevier Inc[employer]   Moderate Conscious Sedation, Adult, Care After This sheet gives you information about how to care for yourself after your procedure. Your health care provider may also give you more specific instructions. If you have problems or questions, contact your health care provider. What can I expect after the procedure? After the procedure, it is common to have: Sleepiness for several hours. Impaired judgment for several hours. Difficulty with balance. Vomiting if you eat too soon. Follow these instructions at home: For the time period you were told by your health care provider: Rest. Do not participate in activities where you could fall or become injured. Do not drive or use machinery. Do not drink alcohol. Do not take sleeping pills or medicines that cause drowsiness. Do not make  important decisions or sign legal documents. Do not take care of children on your own. Eating and drinking  Follow the diet recommended by your health care provider. Drink enough fluid to keep your urine pale yellow. If you vomit: Drink water, juice, or soup when you can drink without vomiting. Make sure you have little or no nausea before eating solid foods. General instructions Take over-the-counter and prescription medicines only as told by your health care provider. Have a responsible adult stay with you for the time you are told. It is important to have someone help care for you until you are awake and alert. Do not smoke. Keep all follow-up visits as told by your health care provider. This is important. Contact a health care provider if: You are still sleepy or having trouble with balance after 24 hours. You feel light-headed. You keep feeling nauseous or you keep vomiting. You develop a rash. You have a fever. You have redness or swelling around the IV site. Get help right away if: You have trouble breathing. You have new-onset confusion at home. Summary After the procedure, it is common to feel sleepy, have impaired judgment, or feel nauseous if you eat too soon. Rest after you get home.  Know the things you should not do after the procedure. Follow the diet recommended by your health care provider and drink enough fluid to keep your urine pale yellow. Get help right away if you have trouble breathing or new-onset confusion at home. This information is not intended to replace advice given to you by your health care provider. Make sure you discuss any questions you have with your health care provider. Document Revised: 12/25/2019 Document Reviewed: 07/23/2019 Elsevier Patient Education  2023 [employer].

## 2023-06-27 LAB — SURGICAL PATHOLOGY

## 2023-07-01 DIAGNOSIS — R748 Abnormal levels of other serum enzymes: Secondary | ICD-10-CM | POA: Diagnosis not present

## 2023-07-09 ENCOUNTER — Other Ambulatory Visit: Payer: Self-pay

## 2023-07-09 ENCOUNTER — Other Ambulatory Visit (HOSPITAL_COMMUNITY): Payer: Self-pay

## 2023-07-09 ENCOUNTER — Emergency Department (HOSPITAL_BASED_OUTPATIENT_CLINIC_OR_DEPARTMENT_OTHER): Payer: Commercial Managed Care - PPO

## 2023-07-09 ENCOUNTER — Emergency Department (HOSPITAL_BASED_OUTPATIENT_CLINIC_OR_DEPARTMENT_OTHER)
Admission: EM | Admit: 2023-07-09 | Discharge: 2023-07-09 | Disposition: A | Discharge status: Home / Self Care | Payer: Commercial Managed Care - PPO | Attending: Emergency Medicine | Admitting: Emergency Medicine

## 2023-07-09 ENCOUNTER — Encounter (HOSPITAL_BASED_OUTPATIENT_CLINIC_OR_DEPARTMENT_OTHER): Payer: Self-pay

## 2023-07-09 DIAGNOSIS — D72829 Elevated white blood cell count, unspecified: Secondary | ICD-10-CM | POA: Diagnosis not present

## 2023-07-09 DIAGNOSIS — Z7984 Long term (current) use of oral hypoglycemic drugs: Secondary | ICD-10-CM | POA: Diagnosis not present

## 2023-07-09 DIAGNOSIS — K7460 Unspecified cirrhosis of liver: Secondary | ICD-10-CM | POA: Diagnosis not present

## 2023-07-09 DIAGNOSIS — R1013 Epigastric pain: Secondary | ICD-10-CM | POA: Diagnosis not present

## 2023-07-09 DIAGNOSIS — R7401 Elevation of levels of liver transaminase levels: Secondary | ICD-10-CM | POA: Diagnosis not present

## 2023-07-09 DIAGNOSIS — Z794 Long term (current) use of insulin: Secondary | ICD-10-CM | POA: Insufficient documentation

## 2023-07-09 DIAGNOSIS — R110 Nausea: Secondary | ICD-10-CM | POA: Insufficient documentation

## 2023-07-09 DIAGNOSIS — K838 Other specified diseases of biliary tract: Secondary | ICD-10-CM | POA: Diagnosis not present

## 2023-07-09 DIAGNOSIS — D1803 Hemangioma of intra-abdominal structures: Secondary | ICD-10-CM | POA: Diagnosis not present

## 2023-07-09 LAB — COMPREHENSIVE METABOLIC PANEL
ALT: 31 U/L (ref 0–44)
AST: 33 U/L (ref 15–41)
Albumin: 3.8 g/dL (ref 3.5–5.0)
Alkaline Phosphatase: 240 U/L — ABNORMAL HIGH (ref 38–126)
Anion gap: 6 (ref 5–15)
BUN: 18 mg/dL (ref 6–20)
CO2: 27 mmol/L (ref 22–32)
Calcium: 9.6 mg/dL (ref 8.9–10.3)
Chloride: 102 mmol/L (ref 98–111)
Creatinine, Ser: 0.84 mg/dL (ref 0.44–1.00)
GFR, Estimated: >60 mL/min (ref >60)
Glucose, Bld: 107 mg/dL — ABNORMAL HIGH (ref 70–99)
Potassium: 3.5 mmol/L (ref 3.5–5.1)
Sodium: 135 mmol/L (ref 135–145)
Total Bilirubin: 0.9 mg/dL (ref 0.3–1.2)
Total Protein: 9.5 g/dL — ABNORMAL HIGH (ref 6.5–8.1)

## 2023-07-09 LAB — URINALYSIS, ROUTINE W REFLEX MICROSCOPIC
Bacteria, UA: NONE SEEN
Bilirubin Urine: NEGATIVE
Glucose, UA: NEGATIVE mg/dL
Ketones, ur: NEGATIVE mg/dL
Leukocytes,Ua: NEGATIVE
Nitrite: NEGATIVE
Specific Gravity, Urine: 1.032 — ABNORMAL HIGH (ref 1.005–1.030)
pH: 6 (ref 5.0–8.0)

## 2023-07-09 LAB — LIPASE, BLOOD: Lipase: 22 U/L (ref 11–51)

## 2023-07-09 LAB — CBC
HCT: 35.1 % — ABNORMAL LOW (ref 36.0–46.0)
Hemoglobin: 11.4 g/dL — ABNORMAL LOW (ref 12.0–15.0)
MCH: 22.1 pg — ABNORMAL LOW (ref 26.0–34.0)
MCHC: 32.5 g/dL (ref 30.0–36.0)
MCV: 68 fL — ABNORMAL LOW (ref 80.0–100.0)
Platelets: 326 10*3/uL (ref 150–400)
RBC: 5.16 MIL/uL — ABNORMAL HIGH (ref 3.87–5.11)
RDW: 15.6 % — ABNORMAL HIGH (ref 11.5–15.5)
WBC: 11.1 10*3/uL — ABNORMAL HIGH (ref 4.0–10.5)
nRBC: 0 % (ref 0.0–0.2)

## 2023-07-09 LAB — PREGNANCY, URINE: Preg Test, Ur: NEGATIVE

## 2023-07-09 MED ORDER — INFLUENZA VIRUS VACC SPLIT PF (FLUZONE) 0.5 ML IM SUSY
0.5000 mL | PREFILLED_SYRINGE | Freq: Once | INTRAMUSCULAR | 0 refills | Status: AC
  Filled 2023-07-09: qty 0.5, 1d supply, fill #0

## 2023-07-09 MED ORDER — IOHEXOL 300 MG/ML  SOLN
100.0000 mL | Freq: Once | INTRAMUSCULAR | Status: AC | PRN
Start: 2023-07-09 — End: 2023-07-09
  Administered 2023-07-09: 100 mL via INTRAVENOUS

## 2023-07-09 MED ORDER — MORPHINE SULFATE (PF) 4 MG/ML IV SOLN
4.0000 mg | Freq: Once | INTRAVENOUS | Status: AC
  Administered 2023-07-09: 4 mg via INTRAVENOUS
  Filled 2023-07-09: qty 1

## 2023-07-09 MED ORDER — ONDANSETRON HCL 4 MG/2ML IJ SOLN
4.0000 mg | Freq: Once | INTRAMUSCULAR | Status: AC
  Administered 2023-07-09: 4 mg via INTRAVENOUS
  Filled 2023-07-09: qty 2

## 2023-07-09 NOTE — ED Triage Notes (Signed)
Complaining of pain in the epigastric area that goes through to the back and up into her shoulders. Started around 9 pm and does not change with movement or rest.

## 2023-07-09 NOTE — ED Provider Notes (Signed)
[facility] AT [facility]  Provider Note  CSN: [id number] Arrival date & time: 07/09/23 0015  History Chief Complaint  Patient presents with   Abdominal Pain    Karina [name] is a 42 y.o. female with history of biliary colic, Choledocholithiasis s/p ERCP, and hepatic steatosis with continued intermittent abdominal pain had a liver biopsy about 10 days ago but has continued to have episodes of epigastric pain, radiating into her back and shoulders, associated with nausea but no vomiting. No diarrhea or urinary symptoms.    Home Medications Prior to Admission medications   Medication Sig Start Date End Date Taking? Authorizing Provider  ALPRAZolam Prudy Feeler) 0.5 MG tablet Take 1 tablet (0.5 mg total) by mouth daily as needed for anxiety 03/19/23     B Complex-C (B-COMPLEX WITH VITAMIN C) tablet Take 1 tablet by mouth daily. Patient not taking: Reported on 02/13/2023 08/23/22   Rolly Salter, MD  cabergoline (DOSTINEX) 0.5 MG tablet Take 1/2 tablet (0.25 mg total) by mouth 2 (two) times a week. 02/25/23     Calcipotriene-Betameth Diprop (ENSTILAR) 0.005-0.064 % FOAM Apply 1 Application topically daily as needed (eczema). 04/11/22   [provider]  Cholecalciferol 1.25 MG (50000 UT) capsule Take 1 capsule (50,000 Units total) by mouth once a week. 09/21/22     furosemide (LASIX) 20 MG tablet Take 1 tablet (20 mg total) by mouth every other day. Patient not taking: Reported on 02/13/2023 03/16/22     imiquimod (ALDARA) 5 % cream Apply topically 3 (three) times a week at bedtime until warts completely disappear up to a maximum of 12 weeks. Patient taking differently: Apply 1 Application topically at bedtime as needed (warts). Up to 3 times a week at bedtime 03/16/22     Insulin Pen Needle (TECHLITE PEN NEEDLES) 31G X 5 MM MISC Use 1 pen needle daily to inject saxenda 10/19/22   Podraza, Rudy Jew, PA-C  Liraglutide -Weight Management (SAXENDA) 18 MG/3ML  SOPN Inject 0.6 mg into the skin daily for 30 days, THEN 1.2 mg daily for 30 days, THEN 1.8 mg daily for 30 days. Patient taking differently: Inject 1.8 mg into the skin daily. 09/27/22     metFORMIN (GLUCOPHAGE-XR) 500 MG 24 hr tablet Take 2 tablets (1,000 mg total) by mouth daily. 02/11/23     omeprazole (PRILOSEC) 40 MG capsule Take 1 capsule (40 mg total) by mouth 2 (two) times daily. 03/19/23     pantoprazole (PROTONIX) 40 MG tablet Take 1 tablet (40 mg total) by mouth daily. Patient not taking: Reported on 02/13/2023 08/23/22   Rolly Salter, MD  pantoprazole (PROTONIX) 40 MG tablet Take 1 tablet (40 mg total) by mouth daily. 02/22/23     predniSONE (DELTASONE) 10 MG tablet Take 4 tablets (40 mg total) by mouth daily. 04/17/23     Semaglutide-Weight Management (WEGOVY) 0.25 MG/0.5ML SOAJ Inject 0.25 mg into the skin once a week. Patient not taking: Reported on 02/13/2023 09/21/22     Semaglutide-Weight Management (WEGOVY) 0.5 MG/0.5ML SOAJ Inject 0.5 mg into the skin once a week. Patient not taking: Reported on 02/13/2023 12/19/22     tiZANidine (ZANAFLEX) 2 MG tablet Take 1-2 tablets (2-4 mg total) by mouth 3 (three) times daily as needed for muscle spasms 03/19/23     tranexamic acid (LYSTEDA) 650 MG TABS tablet Take 2 tablets (1,300 mg total) by mouth 3 (three) times daily for 5 days. 02/25/23     ursodiol (ACTIGALL)  250 MG tablet Take 1 tablet (250 mg total) by mouth 2 (two) times daily. 03/19/23     VENTOLIN HFA 108 (90 Base) MCG/ACT inhaler Inhale 2 puffs into the lungs every 4 (four) hours as needed for wheezing 03/19/23     Vitamin D, Ergocalciferol, (DRISDOL) 1.25 MG (50000 UNIT) CAPS capsule Take 1 capsule (50,000 Units total) by mouth every 7 (seven) days. 03/19/23        Allergies    Patient has no known allergies.   Review of Systems   Review of Systems Please see HPI for pertinent positives and negatives  Physical Exam BP 106/61   Pulse 81   Temp 97.8 F (36.6 C) (Oral)   Resp 16   Ht  5\' 4"  (1.626 m)   Wt 124.7 kg   LMP 06/01/2023 Comment: No sexula activity in 6 month  SpO2 100%   BMI 47.20 kg/m   Physical Exam Vitals and nursing note reviewed.  Constitutional:      Appearance: Normal appearance.  HENT:     Head: Normocephalic and atraumatic.     Nose: Nose normal.     Mouth/Throat:     Mouth: Mucous membranes are moist.  Eyes:     Extraocular Movements: Extraocular movements intact.     Conjunctiva/sclera: Conjunctivae normal.  Cardiovascular:     Rate and Rhythm: Normal rate.  Pulmonary:     Effort: Pulmonary effort is normal.     Breath sounds: Normal breath sounds.  Abdominal:     General: Abdomen is flat.     Palpations: Abdomen is soft.     Tenderness: There is abdominal tenderness in the epigastric area. There is no guarding. Negative signs include Murphy's sign and McBurney's sign.  Musculoskeletal:        General: No swelling. Normal range of motion.     Cervical back: Neck supple.  Skin:    General: Skin is warm and dry.  Neurological:     General: No focal deficit present.     Mental Status: She is alert.  Psychiatric:        Mood and Affect: Mood normal.     ED Results / Procedures / Treatments   EKG None  Procedures Procedures  Medications Ordered in the ED Medications  morphine (PF) 4 MG/ML injection 4 mg (4 mg Intravenous Given 07/09/23 0154)  ondansetron (ZOFRAN) injection 4 mg (4 mg Intravenous Given 07/09/23 0153)  iohexol (OMNIPAQUE) 300 MG/ML solution 100 mL (100 mLs Intravenous Contrast Given 07/09/23 0225)    Initial Impression and Plan  Patient with history of Choledocholithiasis and fatty liver had recent liver biopsy. Her pain increased today, associated with nausea. Exam is benign. Labs done in triage show CBC with mild leukocytosis, improved from previous. CMP with mildly elevated ALP, improved from previous. Other LFTs have normalized. Will check CT for signs of recurrent CBD stone or complication from biopsy.  Pain/nausea meds for comfort.   ED Course   Clinical Course as of 07/09/23 0520  Tue Jul 09, 2023  0215 UA and HCG are neg.  [CS]  [phone] I personally viewed the images from radiology studies and agree with radiologist interpretation: CT is neg for acute process or complication from biopsy. She is sleeping soundly in no distress on re-evaluation. Recommend she continue with outpatient GI management. RTED for any other concerns.  [CS]    Clinical Course User Index [CS] Pollyann Savoy, MD     MDM Rules/Calculators/A&P Medical Decision Making Problems  Addressed: Epigastric pain: chronic illness or injury with exacerbation, progression, or side effects of treatment  Amount and/or Complexity of Data Reviewed Labs: ordered. Decision-making details documented in ED Course. Radiology: ordered and independent interpretation performed. Decision-making details documented in ED Course.  Risk Prescription drug management. Parenteral controlled substances.     Final Clinical Impression(s) / ED Diagnoses Final diagnoses:  Epigastric pain    Rx / DC Orders ED Discharge Orders     None        Pollyann Savoy, MD 07/09/23 [phone]

## 2023-07-10 DIAGNOSIS — R195 Other fecal abnormalities: Secondary | ICD-10-CM | POA: Diagnosis not present

## 2023-07-15 DIAGNOSIS — K8309 Other cholangitis: Secondary | ICD-10-CM | POA: Diagnosis not present

## 2023-07-15 DIAGNOSIS — K754 Autoimmune hepatitis: Secondary | ICD-10-CM | POA: Diagnosis not present

## 2023-07-16 ENCOUNTER — Other Ambulatory Visit: Payer: Self-pay

## 2023-07-16 ENCOUNTER — Other Ambulatory Visit (HOSPITAL_COMMUNITY): Payer: Self-pay

## 2023-07-18 ENCOUNTER — Encounter: Payer: Self-pay | Admitting: Cardiovascular Disease

## 2023-07-18 ENCOUNTER — Ambulatory Visit: Payer: Commercial Managed Care - PPO | Attending: Cardiovascular Disease | Admitting: Cardiovascular Disease

## 2023-07-18 VITALS — BP 100/64 | HR 60 | Ht 65.0 in | Wt 272.0 lb

## 2023-07-18 DIAGNOSIS — D582 Other hemoglobinopathies: Secondary | ICD-10-CM | POA: Diagnosis not present

## 2023-07-18 DIAGNOSIS — D509 Iron deficiency anemia, unspecified: Secondary | ICD-10-CM

## 2023-07-18 DIAGNOSIS — R079 Chest pain, unspecified: Secondary | ICD-10-CM

## 2023-07-18 NOTE — Progress Notes (Addendum)
Cardiology Office Note    Date:  07/27/2023   ID:  Karina [name], Millard 1980/09/29, MRN [id number]  PCP:  Bailey Mech, PA-C  Cardiologist:  Nicki Guadalajara, MD   10 month F/U cardiology evaluation initially  referred by Clint Guy, PA-C for evaluation of chest pain.   History of Present Illness:  Karina [name] is a 42 y.o. female who is followed by [name], PA-C at [facility] in [place].  Patient has experienced episodic sharp chest pain in July 06, 2022 woke up with chest pain with left arm radiation.  She went to the emergency room and in the triage evaluation note the pain was sharp epigastric rating to her back with posterior neck and left arm pain.  Symptoms began at 22:00 and was unassociated with nausea or vomiting.  She has remote history of lap band surgery, cholecystectomy right cell appendectomy.  Apparently, she left before being seen by the physician.  She underwent CT angiography of her chest abdomen and pelvis which did not reveal any evidence for aortic dissection or aneurysm.  There were no acute findings.  There were morphologic features of the liver suggestive of possible early cirrhosis.  A chest x-ray did not show active cardiopulmonary disease.  Troponins were negative.  White blood count was increased at 13.8.  MCV was low at 69.3 with a hemoglobin of 10.8 and hematocrit of 33.  There was mild transaminase elevation with AST 54 ALT 44 and alkaline phosphatase 231.  Potassium was 3.6.  Ultimately due to the long wait, she left AMA.  She admits significant weight gain of approximately 50 pounds over the last 3 years since her Lap-Band surgery.  When seen by call Podrazza it was recommended that she have a nuclear stress test.  She also has a remote history of obstructive sleep apnea which was originally diagnosed in [place].  She was on CPAP remotely but has not been on therapy for at least 5 years.  She  recently evaluated by her primary provider and is referred for cardiology consultation.  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  She typically works from 6 PM to 6 AM on Monday Tuesday and Wednesday and sleeps from 9 until 2 until she has to get her children.  She admits to fatigability.  During that evaluation, I recommended she undergo a 2D echo Doppler study.  Although her primary physician had recommended she undergo a nuclear stress test with her large body habitus and high likelihood for breast attenuation artifact I recommended she undergo coronary CTA for more definitive evaluation.  I also recommended follow-up laboratory in with her microcytic anemia she may ultimately require iron studies or evaluation for hemoglobinopathy.  Also recommended she undergo a split-night study for evaluation of sleep apnea with her super morbid obesity.  She was hospitalized from December 10 through August 23, 2022 and was found to have choledocholithiasis, transaminitis and underwent ERCP on December 13.  She was found to have common bile duct stones and a stent was placed and stones were removed.  CT angio of her chest was negative for PE, aneurysm or dissection.  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 A 2D echo Doppler study  on August 19, 2022  showed an EF of 60 to 65%.  She had normal strain.  There was no significant valvular abnormalities.  Coronary CTA on August 21, 2022 showed a calcium score of 0.  She had normal coronary origin with left dominance.  There was a suggestion of a possible small PFO.  Chest CT over read did not show any acute or significant extracardiac findings.    I saw her in follow-up on October 02, 2022 at which time she felt well.  She has not yet had her split-night sleep study.  Her abdominal pain has  improved.  She is to be followed by Dr. Elnoria Howard in GI who will remove her stents in approximately 2 months.  She is on Emgality for weight loss and also has been taking Saxenda.    Since I last saw her, she has continued to feel well.  She admits to occasional numbness in her leg.  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  She often goes to bed at 8:30 in the morning and sleeps until 230.  She works 3 days a week on this schedule and when not working often sleeps through the night.  She denies chest pain or shortness of breath.  She is on metformin for polycystic ovary syndrome.  She is no longer on Saxenda or Lysteda.  She takes furosemide on a as needed basis.  She never pursued the sleep evaluation.  She was on [facility] but is no longer taking this.  She is unaware of any palpitations, presyncope or syncope.  She presents for a 30-month follow-up evaluation.  Past Medical History:  Diagnosis Date   Anemia    takes iron daily   Anxiety    no meds   Asthma    albuterol as needed   Chronic bronchitis    bronchitis x 1 event in her twenties   Complication of anesthesia    decreased respirations with lap band sx   Dysrhythmia     " arrhythmia during endoscopy procedure "    Edema extremities    hx of edema in lower ext.used to take lasix - none since twenties   Gallbladder disease    GERD (gastroesophageal reflux disease)    nexium    Hemoglobin C trait (HCC)    History of chicken pox    HSV (herpes simplex virus) anogenital infection    Neuropathy    feet   Pre-diabetes    Pregnant    svd 06/19/17   Sleep apnea    Does not use cpap nightly   SVD (spontaneous vaginal delivery)    x 2   Wears glasses     Past Surgical History:  Procedure Laterality Date   BILIARY STENT PLACEMENT N/A 08/29/2022   Procedure: BILIARY STENT PLACEMENT;  Surgeon: Jeani Hawking, MD;  Location: [facility];  Service: Gastroenterology;  Laterality: N/A;    BIOPSY  07/14/2020   Procedure: BIOPSY;  Surgeon: Charna Elizabeth, MD;  [facility]: [facility];  Service: Endoscopy;;   BIOPSY  02/15/2023   Procedure: BIOPSY;  Surgeon: Jeani Hawking, MD;  [facility]: [facility];  Service: Gastroenterology;;   Fidela Salisbury RELEASE Bilateral 2008, 2010   CHOLECYSTECTOMY  01/11/2012   Procedure: LAPAROSCOPIC CHOLECYSTECTOMY WITH INTRAOPERATIVE CHOLANGIOGRAM;  Surgeon: Cherylynn Ridges, MD;  Location: [facility];  Service: General;  Laterality: N/A;   COLONOSCOPY     COLONOSCOPY WITH PROPOFOL N/A 07/14/2020   Procedure: COLONOSCOPY WITH PROPOFOL;  Surgeon: Charna Elizabeth, MD;  Location: [facility];  Service: Endoscopy;  Laterality: N/A;   DILATION AND EVACUATION N/A 03/24/2020   Procedure: DILATATION AND EVACUATION;  Surgeon: Hoover Browns, MD;  Location: [facility];  Service: Gynecology;  Laterality: N/A;   ENDOSCOPIC RETROGRADE CHOLANGIOPANCREATOGRAPHY (ERCP) WITH PROPOFOL N/A 08/29/2022   Procedure: ENDOSCOPIC RETROGRADE CHOLANGIOPANCREATOGRAPHY (ERCP) WITH PROPOFOL;  Surgeon: Jeani Hawking, MD;  [facility];  Service: Gastroenterology;  Laterality: N/A;   ENDOSCOPIC RETROGRADE CHOLANGIOPANCREATOGRAPHY (ERCP) WITH PROPOFOL N/A 02/15/2023   Procedure: ENDOSCOPIC RETROGRADE CHOLANGIOPANCREATOGRAPHY (ERCP) WITH PROPOFOL;  Surgeon: Jeani Hawking, MD;  [facility];  Service: Gastroenterology;  Laterality: N/A;   ERCP N/A 08/22/2022   Procedure: ENDOSCOPIC RETROGRADE CHOLANGIOPANCREATOGRAPHY (ERCP);  Surgeon: Jeani Hawking, MD;  Location: [facility];  Service: Gastroenterology;  Laterality: N/A;   LAPAROSCOPIC BILATERAL SALPINGECTOMY Right 08/14/2017   Procedure: LAPAROSCOPIC RIGHT  SALPINGECTOMY;  Surgeon: Silverio Lay, MD;  Location: [facility];  Service: Gynecology;  Laterality: Right;   LAPAROSCOPIC GASTRIC BANDING     REMOVAL OF STONES  08/22/2022   Procedure: REMOVAL OF STONES;  Surgeon: Jeani Hawking, MD;  Location: [facility];  Service: Gastroenterology;;    REMOVAL OF STONES  08/29/2022   Procedure: REMOVAL OF STONES;  Surgeon: Jeani Hawking, MD;  Location: [facility];  Service: Gastroenterology;;   REMOVAL OF STONES  02/15/2023   Procedure: REMOVAL OF STONES;  Surgeon: Jeani Hawking, MD;  Location: [facility];  Service: Gastroenterology;;   Dennison Mascot  08/22/2022   Procedure: Dennison Mascot;  Surgeon: Jeani Hawking, MD;  Location: [facility];  Service: Gastroenterology;;   Burman Freestone CHOLANGIOSCOPY N/A 08/29/2022   Procedure: [id number] CHOLANGIOSCOPY;  Surgeon: Jeani Hawking, MD;  [facility];  Service: Gastroenterology;  Laterality: N/A;   SPYGLASS CHOLANGIOSCOPY N/A 02/15/2023   Procedure: [id number] CHOLANGIOSCOPY;  Surgeon: Jeani Hawking, MD;  [facility];  Service: Gastroenterology;  Laterality: N/A;   SPYGLASS LITHOTRIPSY N/A 08/29/2022   Procedure: [id number] LITHOTRIPSY;  Surgeon: Jeani Hawking, MD;  [facility];  Service: Gastroenterology;  Laterality: N/A;   STENT REMOVAL  02/15/2023   Procedure: STENT REMOVAL;  Surgeon: Jeani Hawking, MD;  Location: [facility];  Service: Gastroenterology;;   STONE EXTRACTION WITH BASKET  08/29/2022   Procedure: STONE EXTRACTION WITH BASKET;  Surgeon: Jeani Hawking, MD;  [facility];  Service: Gastroenterology;;   UPPER GI ENDOSCOPY     WISDOM TOOTH EXTRACTION      Current Medications: Outpatient Medications Prior to Visit  Medication Sig Dispense Refill   ALPRAZolam (XANAX) 0.5 MG tablet Take 1 tablet (0.5 mg total) by mouth daily as needed for anxiety 30 tablet 0   B Complex-C (B-COMPLEX WITH VITAMIN C) tablet Take 1 tablet by mouth daily. 120 tablet 0   cabergoline (DOSTINEX) 0.5 MG tablet Take 1/2 tablet (0.25 mg total) by mouth 2 (two) times a week. 12 tablet 4   Calcipotriene-Betameth Diprop (ENSTILAR) 0.005-0.064 % FOAM Apply 1 Application topically daily as needed (eczema).     Cholecalciferol 1.25 MG (50000 UT) capsule Take 1 capsule (50,000 Units  total) by mouth once a week. 12 capsule 1   furosemide (LASIX) 20 MG tablet Take 1 tablet (20 mg total) by mouth every other day. 45 tablet 3   imiquimod (ALDARA) 5 % cream Apply topically 3 (three) times a week at bedtime until warts completely disappear up to a maximum of 12 weeks. (Patient taking differently: Apply 1 Application topically at bedtime as needed (warts). Up to 3 times a week at bedtime)  24 each 3   Insulin Pen Needle (TECHLITE PEN NEEDLES) 31G X 5 MM MISC Use 1 pen needle daily to inject saxenda 100 each 0   metFORMIN (GLUCOPHAGE-XR) 500 MG 24 hr tablet Take 2 tablets (1,000 mg total) by mouth daily. 180 tablet 3   omeprazole (PRILOSEC) 40 MG capsule Take 1 capsule (40 mg total) by mouth 2 (two) times daily. 180 capsule 1   pantoprazole (PROTONIX) 40 MG tablet Take 1 tablet (40 mg total) by mouth daily. 14 tablet 0   pantoprazole (PROTONIX) 40 MG tablet Take 1 tablet (40 mg total) by mouth daily. 30 tablet 5   predniSONE (DELTASONE) 10 MG tablet Take 4 tablets (40 mg total) by mouth daily. 120 tablet 3   tiZANidine (ZANAFLEX) 2 MG tablet Take 1-2 tablets (2-4 mg total) by mouth 3 (three) times daily as needed for muscle spasms 60 tablet 2   tranexamic acid (LYSTEDA) 650 MG TABS tablet Take 2 tablets (1,300 mg total) by mouth 3 (three) times daily for 5 days. 30 tablet 11   ursodiol (ACTIGALL) 250 MG tablet Take 1 tablet (250 mg total) by mouth 2 (two) times daily. 60 tablet 2   VENTOLIN HFA 108 (90 Base) MCG/ACT inhaler Inhale 2 puffs into the lungs every 4 (four) hours as needed for wheezing 18 g 11   Vitamin D, Ergocalciferol, (DRISDOL) 1.25 MG (50000 UNIT) CAPS capsule Take 1 capsule (50,000 Units total) by mouth every 7 (seven) days. 12 capsule 1   Liraglutide -Weight Management (SAXENDA) 18 MG/3ML SOPN Inject 0.6 mg into the skin daily for 30 days, THEN 1.2 mg daily for 30 days, THEN 1.8 mg daily for 30 days. (Patient taking differently: Inject 1.8 mg into the skin daily.) 15 mL 1    Semaglutide-Weight Management (WEGOVY) 0.25 MG/0.5ML SOAJ Inject 0.25 mg into the skin once a week. (Patient not taking: Reported on 02/13/2023) 2 mL 0   Semaglutide-Weight Management (WEGOVY) 0.5 MG/0.5ML SOAJ Inject 0.5 mg into the skin once a week. (Patient not taking: Reported on 02/13/2023) 3 mL 3   No facility-administered medications prior to visit.     Allergies:   Patient has no known allergies.   Social History   Socioeconomic History   Marital status: Married    Spouse name: Not on file   Number of children: 1   Years of education: college   Highest education level: Not on file  Occupational History   Not on file  Tobacco Use   Smoking status: Former    Current packs/day: 0.00    Average packs/day: 0.3 packs/day for 1 year (0.3 ttl pk-yrs)    Types: Cigars, Cigarettes    Start date: 09/10/1994    Quit date: 09/11/1995    Years since quitting: 27.8   Smokeless tobacco: Never  Vaping Use   Vaping status: Never Used  Substance and Sexual Activity   Alcohol use: Not Currently    Comment: socially-none since pregnancy - SVD 06/19/17   Drug use: No   Sexual activity: Not Currently    Birth control/protection: None    Comment: 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 Deirdre Priest in [place]   Drinks 1-2 cups of coffee a day    Social Determinants of Health   Financial Resource Strain: Low Risk  (03/16/2022)   Received from [facility], [facility] prior to 11/10/2022.,  [facility] prior to 11/10/2022.   Overall Financial Resource Strain (CARDIA)    Difficulty of Paying Living Expenses: Not hard at all  Food Insecurity: Low Risk  (10/30/2022)   [facility]   Hunger Vital Sign    Worried About Running Out of Food in the Last Year: Never true    Within the past 12 months, the food you bought  just didn't last and you didn't have money to get more: Not on file  Transportation Needs: No Transportation Needs (10/30/2022)   [facility]   Transportation    In the past 12 months, has lack of reliable transportation kept you from medical appointments, meetings, work or from getting things needed for daily living? : No  Physical Activity: Unknown (03/16/2022)   [facility], [facility] prior to 11/10/2022., [facility] prior to 11/10/2022., [facility] visits prior to 11/10/2022.   Exercise Vital Sign    Days of Exercise per Week: 0 days    Minutes of Exercise per Session: Not on file  Stress: Not on file  Social Connections: Unknown (03/16/2022)   [facility], [facility] prior to 11/10/2022., [facility] visits prior to 11/10/2022., [facility] visits prior to 11/10/2022.   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  She has lived in [place].  She is married for 9 years and has 2 children.  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  Family History:  The patient's family history includes Anesthesia problems in her mother; Asthma in her maternal grandmother and sister; Cervical cancer in her maternal aunt; Diabetes in her sister; Heart attack in her mother; Heart disease in an other  family member; Hyperlipidemia in an other family member; Hypertension in her brother, father, mother, and another family member; Kidney disease in an other family member; Liver cancer in her paternal grandfather and paternal uncle; Ovarian cancer in her maternal aunt; Prostate cancer in her maternal uncle and paternal grandfather; Stroke in her maternal grandfather, mother, and another family member; Thyroid disease in an other family member.   Both parents are alive, mother age 4 and had previous heart attack and stroke and has hypertension.  Father is 46 and has hypertension and hyperlipidemia.  She has 3 brothers 1 with asthma and high blood pressure and other with hypertension and hyperlipidemia.  She has 4 sisters all who have thyroid abnormalities and 1 with diabetes.  Her children are 85 and 69 years old.  ROS General: Negative; No fevers, chills, or night sweats;  HEENT: Negative; No changes in vision or hearing, sinus congestion, difficulty swallowing Pulmonary: Asthma Cardiovascular: See HPI GI: History of lap band bariatric surgery; history of biliary stent and remote cholecystectomy; No nausea,  vomiting, diarrhea, or abdominal pain GU: Negative; No dysuria, hematuria, or difficulty voiding Musculoskeletal: Negative; no myalgias, joint pain, or weakness Hematologic/Oncology: Negative; no easy bruising, bleeding Endocrine: Negative; no heat/cold intolerance; no diabetes Neuro: Negative; no changes in balance, headaches Skin: Eczema on enstilar Psychiatric: Negative; No behavioral problems, depression Sleep: Remote diagnosis of OSA and temporary use CPAP.  Other comprehensive 14 point system review is negative.   PHYSICAL EXAM:   VS:  BP 100/64   Pulse 60   Ht 5\' 5"  (1.651 m)   Wt 272 lb (123.4 kg)   LMP 06/01/2023 Comment: No sexula activity in 6 month  SpO2 99%   BMI 45.26 kg/m     Repeat blood pressure by me was 126/70  Wt Readings from Last 3 Encounters:  07/18/23 272 lb  (123.4 kg)  07/09/23 275 lb (124.7 kg)  02/15/23 299 lb 13.2 oz (136 kg)  She admits to a 50 pound weight gain over the past 3 years  Weight has improved from 311 down to 272 today  General: Alert, oriented, no distress.  Skin: normal turgor, no rashes, warm and dry HEENT: Normocephalic, atraumatic. Pupils equal round and reactive to light; sclera anicteric; extraocular muscles intact;  Nose without nasal septal hypertrophy Mouth/Parynx benign; Mallinpatti scale Neck: No JVD, no carotid bruits; normal carotid upstroke Lungs: clear to ausculatation and percussion; no wheezing or rales Chest wall: without tenderness to palpitation Heart: PMI not displaced, RRR, s1 s2 normal, 1/6 systolic murmur, no diastolic murmur, no rubs, gallops, thrills, or heaves Abdomen: soft, nontender; no hepatosplenomehaly, BS+; abdominal aorta nontender and not dilated by palpation. Back: no CVA tenderness Pulses 2+ Musculoskeletal: full range of motion, normal strength, no joint deformities Extremities: no clubbing cyanosis or edema, Homan's sign negative  Neurologic: grossly nonfocal; Cranial nerves grossly wnl Psychologic: Normal mood and affect    Studies/Labs Reviewed:   EKG Interpretation Date/Time:  Thursday July 18 2023 17:09:32 EST Ventricular Rate:  60 PR Interval:  152 QRS Duration:  92 QT Interval:  376 QTC Calculation: 376 R Axis:   43  Text Interpretation: Normal sinus rhythm with sinus arrhythmia Normal ECG When compared with ECG of 19-Aug-2022 14:36, Vent. rate has decreased BY  30 BPM Confirmed by Nicki Guadalajara ([id number]) on 07/18/2023 5:52:47 PM    October 02, 2022 ECG (independently read by me): NSR at 81, no ectopy, normal intervals  August 15, 2022 ECG (independently read by me) NSR at 77, sinus arrhythmia  July 05, 2022 ECG (independently read by me):  NSR at 73   Recent Labs:    Latest Ref Rng & Units 07/09/2023   12:48 AM 06/26/2023   11:51 AM 08/23/2022    4:37  AM  BMP  Glucose 70 - 99 mg/dL 604  82  540   BUN 6 - 20 mg/dL 18  15  13    Creatinine 0.44 - 1.00 mg/dL 9.81  1.91  4.78   Sodium 135 - 145 mmol/L 135  135  134   Potassium 3.5 - 5.1 mmol/L 3.5  3.8  4.4   Chloride 98 - 111 mmol/L 102  103  109   CO2 22 - 32 mmol/L 27  23  21    Calcium 8.9 - 10.3 mg/dL 9.6  8.9  8.5         Latest Ref Rng & Units 07/09/2023   12:48 AM 06/26/2023   11:51 AM 08/23/2022    4:37 AM  Hepatic Function  Total Protein 6.5 -  8.1 g/dL 9.5  9.4  8.3   Albumin 3.5 - 5.0 g/dL 3.8  3.5  2.6   AST 15 - 41 U/L 33  39  90   ALT 0 - 44 U/L 31  34  127   Alk Phosphatase 38 - 126 U/L 240  276  292   Total Bilirubin 0.3 - 1.2 mg/dL 0.9  1.6  1.2        Latest Ref Rng & Units 07/09/2023   12:48 AM 06/26/2023   11:51 AM 08/22/2022    4:35 AM  CBC  WBC 4.0 - 10.5 K/uL 11.1  12.3  10.4   Hemoglobin 12.0 - 15.0 g/dL 96.0  45.4  9.8   Hematocrit 36.0 - 46.0 % 35.1  35.7  30.2   Platelets 150 - 400 K/uL 326  288  312    Lab Results  Component Value Date   MCV 68.0 (L) 07/09/2023   MCV 69.6 (L) 06/26/2023   MCV 68.8 (L) 08/22/2022   Lab Results  Component Value Date   TSH 2.460 04/24/2017   Lab Results  Component Value Date   HGBA1C 5.3 03/09/2022     BNP    Component Value Date/Time   BNP 27.9 08/19/2022 1429    ProBNP No results found for: "PROBNP"   Lipid Panel     Component Value Date/Time   CHOL 141 08/20/2022 0444   TRIG 41 08/20/2022 0444   HDL 36 (L) 08/20/2022 0444   CHOLHDL 3.9 08/20/2022 0444   VLDL 8 08/20/2022 0444   LDLCALC 97 08/20/2022 0444     RADIOLOGY: CT ABDOMEN PELVIS W CONTRAST  Result Date: 07/09/2023 CLINICAL DATA:  Epigastric pain radiating into the back and shoulders. History of elevated LFTs, steatosis and liver biopsy on 06/26/2023. EXAM: CT ABDOMEN AND PELVIS WITH CONTRAST TECHNIQUE: Multidetector CT imaging of the abdomen and pelvis was performed using the standard protocol following bolus  administration of intravenous contrast. RADIATION DOSE REDUCTION: This exam was performed according to the departmental dose-optimization program which includes automated exposure control, adjustment of the mA and/or kV according to patient size and/or use of iterative reconstruction technique. CONTRAST:  OMNIPAQUE IOHEXOL 300 MG/ML  SOLN COMPARISON:  Prior MRI of the abdomen 04/26/2023 FINDINGS: Lower chest: No acute abnormality. Hepatobiliary: Small 9 mm hypoechoic lesion in segment 7 of the hepatic dome again noted. This was previously characterized as a benign cavernous hemangioma on MR imaging. There is blunting of the free edge of the liver as well as a faintly nodular contour consistent with mild cirrhosis. Hypoattenuation of the hepatic parenchyma in the posterior aspect of the right hemi liver consistent with geographic fatty infiltration. No new lesions are visualized. Small amount of linear hypoattenuation in the anterior aspect of the left hemi liver likely reflects the recent prior biopsy tract. No evidence of pseudoaneurysm or perihepatic bleeding. The gallbladder is surgically absent. Stable mild dilation of the extrahepatic common bile duct. Pancreas: Unremarkable. No pancreatic ductal dilatation or surrounding inflammatory changes. Spleen: Normal in size without focal abnormality. Adrenals/Urinary Tract: Normal adrenal glands. No hydronephrosis, nephrolithiasis or enhancing renal mass. 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 The ureters and bladder are unremarkable. Stomach/Bowel: Surgical changes of prior lap band procedure. Normal appendix in the right lower quadrant. No focal bowel wall thickening or evidence of obstruction. Vascular/Lymphatic: No significant vascular findings are present. No enlarged abdominal or pelvic lymph nodes. Reproductive: Uterus and bilateral adnexa are unremarkable. Other: No abdominal wall hernia or  abnormality. No abdominopelvic ascites. Musculoskeletal: No  acute fracture or malalignment. Multilevel degenerative disc disease affecting L3-L4, L4-L5 and L5-S1. IMPRESSION: 1. No acute abnormality within the abdomen or pelvis. Specifically, no evidence of hemorrhage or other delayed complication related to prior liver biopsy earlier this month. 2. Hepatic steatosis with mild cirrhotic changes, similar compared to prior imaging. 3. Surgical changes of prior lap band placement and cholecystectomy. 4. Stable mild dilation of the extrahepatic common bile duct. 5. Mid and lower lumbar degenerative disc disease. Electronically Signed   By: Malachy Moan M.D.   On: 07/09/2023 05:11     Additional studies/ records that were reviewed today include:   I reviewed the records from [facility] ER.  Records of [facility] were reviewed.  Her recent [facility] hospitalization from December 10 through December 14 was reviewed.    ASSESSMENT:    1. Chest pain, unspecified type   2. Morbid obesity (HCC)   3. 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  Her ancestry is from the [place] but she had lived prior to coming to [place] in [place].  She has a history of mild asthma as well as eczema.  She had undergone prior lap band surgery, cholecystectomy, and right salpingectmy.  She admits that at times she has had blood pressure lability.  She has experienced  sharp chest pain which awakened her from sleep associated with back, posterior neck and left arm radiation.  She was evaluated in the emergency room and was assessed in the [facility] encounter.  Subsequent CT angiography of her chest abdomen and pelvis was within normal limits chest x-ray.  Cardiac troponins were normal.  She was noted to have slight white blood count elevation of 13.8.  She had microcytic anemia with MCV 69 hemoglobin 10.8 hematocrit 33.0.   Lipase was 50.  There was mild transaminase elevation with AST 54 ALT 44 and alkaline phosphatase at 233.  Potassium was 3.6.  She ultimately left AGAINST MEDICAL ADVICE due to the very long delay.  She has seen her primary provider Clint Guy, PA-C and was referred to me for initial evaluation on August 15, 2022.  She subsequently was hospitalized from December 10 through December 14 and was found to have common bile duct stones leading to transaminitis.  She underwent ERCP and ultimate removal of stones with stent placement by Dr. Elnoria Howard.  Her cardiac evaluation was done in the hospital and she had negative chest CT for PE and a normal echo Doppler study with EF 60 to 65%.  Coronary CTA showed a calcium score of 0.  Incidentally she was noted to have a very small PFO.  When I initially saw her, a discussed with her possible sleep evaluation.  She never pursued this.  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  On days that she does not work the evening shift she does try to sleep at night.  Her daughters are now 38 and 35 years old.  Her blood pressure today is stable and on repeat by me was 118/66.  I commended her on her weight loss from when I initially saw her at 311 2 current to  72.  She is no longer on Wegovy or's since end up.  She is on metformin for polycystic ovarian syndrome.  She takes furosemide on an as needed basis.  I discussed a Mediterranean heart healthy diet.  She sees Dr. Elnoria Howard for GI follow-up.  She will return to her primary care.  We will be available to see her on an as-needed basis.  Medication Adjustments/Labs and Tests Ordered: Current medicines are reviewed at length with the patient today.  Concerns regarding medicines are outlined above.  Medication changes, Labs and Tests ordered today are listed in the Patient Instructions below. Patient Instructions   Medication Instructions:  Continue same medications   Lab Work: None ordered   Testing/Procedures: None ordered   Follow-Up: At [facility] [place], you and your health needs are our priority.  As part of our continuing mission to provide you with exceptional heart care, we have created designated Provider Care Teams.  These Care Teams include your primary Cardiologist (physician) and Advanced Practice Providers (APPs -  Physician Assistants and Nurse Practitioners) who all work together to provide you with the care you need, when you need it.  We recommend signing up for the patient portal called "MyChart".  Sign up information is provided on this After Visit Summary.  MyChart is used to connect with patients for Virtual Visits (Telemedicine).  Patients are able to view lab/test results, encounter notes, upcoming appointments, etc.  Non-urgent messages can be sent to your provider as well.   To learn more about what you can do with MyChart, go to [email or web address].    Your next appointment:  As Needed    Provider:  Dr.Rhyli Depaula     Signed, Nicki Guadalajara, MD  07/27/2023 10:24 AM    [facility] [address], [address], [place], [place]  [address] Phone: [phone]

## 2023-07-18 NOTE — Patient Instructions (Signed)
Medication Instructions:  Continue same medications   Lab Work: None ordered   Testing/Procedures: None ordered   Follow-Up: At [facility], you and your health needs are our priority.  As part of our continuing mission to provide you with exceptional heart care, we have created designated Provider Care Teams.  These Care Teams include your primary Cardiologist (physician) and Advanced Practice Providers (APPs -  Physician Assistants and Nurse Practitioners) who all work together to provide you with the care you need, when you need it.  We recommend signing up for the patient portal called "MyChart".  Sign up information is provided on this After Visit Summary.  MyChart is used to connect with patients for Virtual Visits (Telemedicine).  Patients are able to view lab/test results, encounter notes, upcoming appointments, etc.  Non-urgent messages can be sent to your provider as well.   To learn more about what you can do with MyChart, go to [email or web address].    Your next appointment:  As Needed    Provider:  [facility]

## 2023-07-24 DIAGNOSIS — R1013 Epigastric pain: Secondary | ICD-10-CM | POA: Diagnosis not present

## 2023-07-24 DIAGNOSIS — K754 Autoimmune hepatitis: Secondary | ICD-10-CM | POA: Diagnosis not present

## 2023-07-27 ENCOUNTER — Encounter: Payer: Self-pay | Admitting: Cardiovascular Disease

## 2023-09-13 ENCOUNTER — Other Ambulatory Visit (HOSPITAL_COMMUNITY): Payer: Self-pay

## 2023-09-16 ENCOUNTER — Other Ambulatory Visit: Payer: Self-pay

## 2023-09-16 ENCOUNTER — Other Ambulatory Visit (HOSPITAL_COMMUNITY): Payer: Self-pay

## 2023-09-19 ENCOUNTER — Other Ambulatory Visit (HOSPITAL_COMMUNITY): Payer: Self-pay

## 2023-09-19 MED ORDER — ALPRAZOLAM 0.5 MG PO TABS
0.5000 mg | ORAL_TABLET | Freq: Every day | ORAL | 0 refills | Status: DC | PRN
  Filled 2023-09-19: qty 30, 30d supply, fill #0

## 2023-09-20 ENCOUNTER — Other Ambulatory Visit (HOSPITAL_COMMUNITY): Payer: Self-pay

## 2023-09-20 ENCOUNTER — Telehealth: Payer: Commercial Managed Care - PPO | Admitting: Nurse Practitioner

## 2023-09-20 DIAGNOSIS — J0140 Acute pansinusitis, unspecified: Secondary | ICD-10-CM | POA: Diagnosis not present

## 2023-09-20 MED ORDER — AMOXICILLIN-POT CLAVULANATE 875-125 MG PO TABS
1.0000 | ORAL_TABLET | Freq: Two times a day (BID) | ORAL | 0 refills | Status: AC
  Filled 2023-09-20: qty 14, 7d supply, fill #0

## 2023-09-20 NOTE — Progress Notes (Signed)
 E-Visit for Sinus Problems  We are sorry that you are not feeling well.  Here is how we plan to help!  Based on what you have shared with me it looks like you have sinusitis.  Sinusitis is inflammation and infection in the sinus cavities of the head.  Based on your presentation I believe you most likely have Acute Bacterial Sinusitis.  This is an infection caused by bacteria and is treated with antibiotics. I have prescribed Augmentin 875mg /125mg  one tablet twice daily with food, for 7 days. You may use an oral decongestant such as Mucinex D or if you have glaucoma or high blood pressure use plain Mucinex. Saline nasal spray help and can safely be used as often as needed for congestion.  If you develop worsening sinus pain, fever or notice severe headache and vision changes, or if symptoms are not better after completion of antibiotic, please schedule an appointment with a health care provider.    Sinus infections are not as easily transmitted as other respiratory infection, however we still recommend that you avoid close contact with loved ones, especially the very young and elderly.  Remember to wash your hands thoroughly throughout the day as this is the number one way to prevent the spread of infection!  Home Care: Only take medications as instructed by your medical team. Complete the entire course of an antibiotic. Do not take these medications with alcohol. A steam or ultrasonic humidifier can help congestion.  You can place a towel over your head and breathe in the steam from hot water coming from a faucet. Avoid close contacts especially the very young and the elderly. Cover your mouth when you cough or sneeze. Always remember to wash your hands.  Get Help Right Away If: You develop worsening fever or sinus pain. You develop a severe head ache or visual changes. Your symptoms persist after you have completed your treatment plan.  Make sure you Understand these instructions. Will watch  your condition. Will get help right away if you are not doing well or get worse.  Thank you for choosing an e-visit.  Your e-visit answers were reviewed by a board certified advanced clinical practitioner to complete your personal care plan. Depending upon the condition, your plan could have included both over the counter or prescription medications.  Please review your pharmacy choice. Make sure the pharmacy is open so you can pick up prescription now. If there is a problem, you may contact your provider through [employer] and have the prescription routed to another pharmacy.  Your safety is important to [place]. If you have drug allergies check your prescription carefully.   For the next 24 hours you can use MyChart to ask questions about today's visit, request a non-urgent call back, or ask for a work or school excuse. You will get an email in the next two days asking about your experience. I hope that your e-visit has been valuable and will speed your recovery.   I spent approximately 5 minutes reviewing the patient's history, current symptoms and coordinating their care today.

## 2023-10-22 DIAGNOSIS — K760 Fatty (change of) liver, not elsewhere classified: Secondary | ICD-10-CM | POA: Diagnosis not present

## 2023-10-22 DIAGNOSIS — R933 Abnormal findings on diagnostic imaging of other parts of digestive tract: Secondary | ICD-10-CM | POA: Diagnosis not present

## 2023-10-22 DIAGNOSIS — K7589 Other specified inflammatory liver diseases: Secondary | ICD-10-CM | POA: Diagnosis not present

## 2023-10-22 DIAGNOSIS — K754 Autoimmune hepatitis: Secondary | ICD-10-CM | POA: Diagnosis not present

## 2023-11-18 DIAGNOSIS — K838 Other specified diseases of biliary tract: Secondary | ICD-10-CM | POA: Diagnosis not present

## 2023-11-18 DIAGNOSIS — R7989 Other specified abnormal findings of blood chemistry: Secondary | ICD-10-CM | POA: Diagnosis not present

## 2023-11-19 DIAGNOSIS — E88819 Insulin resistance, unspecified: Secondary | ICD-10-CM | POA: Diagnosis not present

## 2023-11-19 DIAGNOSIS — E049 Nontoxic goiter, unspecified: Secondary | ICD-10-CM | POA: Diagnosis not present

## 2023-11-19 DIAGNOSIS — E221 Hyperprolactinemia: Secondary | ICD-10-CM | POA: Diagnosis not present

## 2023-11-20 ENCOUNTER — Encounter: Payer: Self-pay | Admitting: Hematology and Oncology

## 2023-11-20 ENCOUNTER — Other Ambulatory Visit: Payer: Self-pay

## 2023-11-20 ENCOUNTER — Other Ambulatory Visit (HOSPITAL_COMMUNITY): Payer: Self-pay

## 2023-11-21 DIAGNOSIS — E559 Vitamin D deficiency, unspecified: Secondary | ICD-10-CM | POA: Diagnosis not present

## 2023-11-21 DIAGNOSIS — E8881 Metabolic syndrome: Secondary | ICD-10-CM | POA: Diagnosis not present

## 2023-11-21 DIAGNOSIS — E221 Hyperprolactinemia: Secondary | ICD-10-CM | POA: Diagnosis not present

## 2023-11-27 ENCOUNTER — Other Ambulatory Visit (HOSPITAL_COMMUNITY): Payer: Self-pay

## 2023-11-29 ENCOUNTER — Other Ambulatory Visit (HOSPITAL_COMMUNITY): Payer: Self-pay

## 2023-12-04 ENCOUNTER — Other Ambulatory Visit (HOSPITAL_COMMUNITY): Payer: Self-pay

## 2023-12-10 ENCOUNTER — Other Ambulatory Visit (HOSPITAL_COMMUNITY): Payer: Self-pay

## 2023-12-12 ENCOUNTER — Other Ambulatory Visit (HOSPITAL_COMMUNITY): Payer: Self-pay

## 2023-12-13 ENCOUNTER — Other Ambulatory Visit: Payer: Self-pay | Admitting: Obstetrics and Gynecology

## 2023-12-13 ENCOUNTER — Other Ambulatory Visit (HOSPITAL_COMMUNITY): Payer: Self-pay

## 2023-12-13 ENCOUNTER — Encounter (HOSPITAL_COMMUNITY): Payer: Self-pay

## 2023-12-13 DIAGNOSIS — K838 Other specified diseases of biliary tract: Secondary | ICD-10-CM | POA: Diagnosis not present

## 2023-12-13 DIAGNOSIS — D1803 Hemangioma of intra-abdominal structures: Secondary | ICD-10-CM | POA: Diagnosis not present

## 2023-12-13 DIAGNOSIS — Z9049 Acquired absence of other specified parts of digestive tract: Secondary | ICD-10-CM | POA: Diagnosis not present

## 2023-12-13 DIAGNOSIS — Z1231 Encounter for screening mammogram for malignant neoplasm of breast: Secondary | ICD-10-CM

## 2023-12-19 DIAGNOSIS — R7989 Other specified abnormal findings of blood chemistry: Secondary | ICD-10-CM | POA: Diagnosis not present

## 2023-12-19 DIAGNOSIS — K760 Fatty (change of) liver, not elsewhere classified: Secondary | ICD-10-CM | POA: Diagnosis not present

## 2023-12-19 DIAGNOSIS — K839 Disease of biliary tract, unspecified: Secondary | ICD-10-CM | POA: Diagnosis not present

## 2023-12-19 DIAGNOSIS — R935 Abnormal findings on diagnostic imaging of other abdominal regions, including retroperitoneum: Secondary | ICD-10-CM | POA: Diagnosis not present

## 2023-12-20 ENCOUNTER — Encounter: Payer: Self-pay | Admitting: Podiatry

## 2023-12-20 ENCOUNTER — Ambulatory Visit (INDEPENDENT_AMBULATORY_CARE_PROVIDER_SITE_OTHER)

## 2023-12-20 ENCOUNTER — Ambulatory Visit: Admitting: Podiatry

## 2023-12-20 DIAGNOSIS — M7752 Other enthesopathy of left foot: Secondary | ICD-10-CM

## 2023-12-20 DIAGNOSIS — S90852D Superficial foreign body, left foot, subsequent encounter: Secondary | ICD-10-CM | POA: Diagnosis not present

## 2023-12-22 NOTE — Progress Notes (Signed)
 Subjective:  Patient ID: Karina [name], female    DOB: 22-Mar-1981,  MRN: [id number]  Chief Complaint  Patient presents with   Foot Pain    RM#11 Left foot pain follow up needing results and treatment.    Discussed the use of AI scribe software for clinical note transcription with the patient, who gave verbal consent to proceed.  History of Present Illness The patient, with a history of foot discomfort, presents with ongoing issues despite previous consultations and investigations. The discomfort is constant, with the patient describing it as a sensation of pain and discomfort. The patient reports a decrease in mobility, specifically the ability to pick things up with her toes, which she was previously able to do. The discomfort is localized to the top of the foot and the side, with the patient also reporting an uncomfortable sensation in the bottom of the foot when pressed. The patient occasionally experiences numbness and tingling in the toes, specifically between the second and third toes. 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      Objective:    Physical Exam General: AAO x3, NAD  Dermatological: Skin lesion.  The possible foreign body that was present previously.  There is no puncture wound identified today there is no edema, erythema or signs of infection.  There is no open lesions.  Vascular: Dorsalis Pedis artery and Posterior Tibial artery pedal pulses are 2/4 bilateral with immedate capillary fill time. There is no pain with calf compression, swelling, warmth, erythema.   Neruologic: Grossly intact via light touch bilateral.   Musculoskeletal: There is edema present of the second interspace of the left foot.  There is tenderness palpation of the area of the previous skin lesion noted.  She does get some discomfort at the heel to the arch of the  foot as well but not able to appreciate area of pinpoint tenderness.  Gait: Unassisted, Nonantalgic.     No images are attached to the encounter.    Results Radiology 3 views left foot were obtained.  There is no evidence of acute fracture. The some increased joint space narrowing is noted of the fourth metatarsal cuboid joint.  There is no evidence of foreign body.  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   Assessment:   1. Capsulitis of metatarsophalangeal (MTP) joint of left foot   2. Foreign body in left foot, subsequent encounter      Plan:  Patient was evaluated and treated and all questions answered.  Assessment and Plan Assessment & Plan Chronic foot pain and discomfort We did extensive conversation today regarding the treatment options.  We discussed both conservative as well as surgical options.  She is concerned about possible residual foreign body given her other medical issues.  Although the MRI did not note this we discussed doing surgery to excise the lesion, debriding clean the area.  We discussed the procedure as well as postoperative course. - Order new foot x-rays for comparison.  See report above. - Provide shoe inserts for support and pressure relief.  Dispensed power steps - Discuss surgical removal of scar tissue or foreign body, addressing risks of scar formation and tenderness. - Advise wearing supportive sneakers.  Potential foreign object in foot Previous foreign body may have caused scar tissue. Imaging does not reveal current foreign object. Surgical exploration is an option.  Return if symptoms worsen or fail to improve.   Charity Conch DPM

## 2024-01-22 ENCOUNTER — Other Ambulatory Visit (HOSPITAL_COMMUNITY): Payer: Self-pay

## 2024-01-22 DIAGNOSIS — R17 Unspecified jaundice: Secondary | ICD-10-CM | POA: Diagnosis not present

## 2024-01-22 DIAGNOSIS — Z4659 Encounter for fitting and adjustment of other gastrointestinal appliance and device: Secondary | ICD-10-CM | POA: Diagnosis not present

## 2024-01-22 DIAGNOSIS — K831 Obstruction of bile duct: Secondary | ICD-10-CM | POA: Diagnosis not present

## 2024-01-22 DIAGNOSIS — R8569 Abnormal cytological findings in specimens from other digestive organs and abdominal cavity: Secondary | ICD-10-CM | POA: Diagnosis not present

## 2024-01-22 DIAGNOSIS — R748 Abnormal levels of other serum enzymes: Secondary | ICD-10-CM | POA: Diagnosis not present

## 2024-01-22 MED ORDER — CIPROFLOXACIN HCL 500 MG PO TABS
500.0000 mg | ORAL_TABLET | Freq: Two times a day (BID) | ORAL | 0 refills | Status: AC
  Filled 2024-01-22 (×3): qty 10, 5d supply, fill #0

## 2024-01-27 DIAGNOSIS — R748 Abnormal levels of other serum enzymes: Secondary | ICD-10-CM | POA: Diagnosis not present

## 2024-01-27 DIAGNOSIS — Z9889 Other specified postprocedural states: Secondary | ICD-10-CM | POA: Diagnosis not present

## 2024-01-27 DIAGNOSIS — Z9884 Bariatric surgery status: Secondary | ICD-10-CM | POA: Diagnosis not present

## 2024-03-10 DIAGNOSIS — Z9889 Other specified postprocedural states: Secondary | ICD-10-CM | POA: Diagnosis not present

## 2024-03-10 DIAGNOSIS — E871 Hypo-osmolality and hyponatremia: Secondary | ICD-10-CM | POA: Diagnosis not present

## 2024-03-10 DIAGNOSIS — G8929 Other chronic pain: Secondary | ICD-10-CM | POA: Diagnosis not present

## 2024-03-10 DIAGNOSIS — Z0000 Encounter for general adult medical examination without abnormal findings: Secondary | ICD-10-CM | POA: Diagnosis not present

## 2024-03-10 DIAGNOSIS — R748 Abnormal levels of other serum enzymes: Secondary | ICD-10-CM | POA: Diagnosis not present

## 2024-03-10 DIAGNOSIS — M25551 Pain in right hip: Secondary | ICD-10-CM | POA: Diagnosis not present

## 2024-03-20 ENCOUNTER — Other Ambulatory Visit: Payer: Self-pay

## 2024-03-20 ENCOUNTER — Other Ambulatory Visit (HOSPITAL_COMMUNITY): Payer: Self-pay

## 2024-03-20 ENCOUNTER — Encounter: Payer: Self-pay | Admitting: Hematology and Oncology

## 2024-03-20 DIAGNOSIS — F418 Other specified anxiety disorders: Secondary | ICD-10-CM | POA: Diagnosis not present

## 2024-03-20 DIAGNOSIS — Z0000 Encounter for general adult medical examination without abnormal findings: Secondary | ICD-10-CM | POA: Diagnosis not present

## 2024-03-20 MED ORDER — URSODIOL 250 MG PO TABS
250.0000 mg | ORAL_TABLET | Freq: Two times a day (BID) | ORAL | 11 refills | Status: AC
  Filled 2024-03-20: qty 60, 30d supply, fill #0
  Filled 2024-08-31: qty 60, 30d supply, fill #1

## 2024-03-20 MED ORDER — VITAMIN D (ERGOCALCIFEROL) 1.25 MG (50000 UNIT) PO CAPS
50000.0000 [IU] | ORAL_CAPSULE | ORAL | 1 refills | Status: AC
  Filled 2024-03-20: qty 12, 84d supply, fill #0
  Filled 2024-08-31: qty 12, 84d supply, fill #1

## 2024-03-20 MED ORDER — VENTOLIN HFA 108 (90 BASE) MCG/ACT IN AERS
2.0000 | INHALATION_SPRAY | RESPIRATORY_TRACT | 11 refills | Status: DC | PRN
  Filled 2024-03-20: qty 18, 25d supply, fill #0

## 2024-03-20 MED ORDER — TIZANIDINE HCL 2 MG PO TABS
2.0000 mg | ORAL_TABLET | Freq: Three times a day (TID) | ORAL | 5 refills | Status: AC | PRN
  Filled 2024-03-20: qty 60, 20d supply, fill #0
  Filled 2024-08-31: qty 60, 20d supply, fill #1

## 2024-03-20 MED ORDER — OMEPRAZOLE 40 MG PO CPDR
40.0000 mg | DELAYED_RELEASE_CAPSULE | Freq: Two times a day (BID) | ORAL | 3 refills | Status: AC
  Filled 2024-03-20: qty 180, 90d supply, fill #0

## 2024-03-24 DIAGNOSIS — R7989 Other specified abnormal findings of blood chemistry: Secondary | ICD-10-CM | POA: Diagnosis not present

## 2024-03-27 ENCOUNTER — Other Ambulatory Visit (HOSPITAL_COMMUNITY): Payer: Self-pay

## 2024-03-27 MED ORDER — ALBUTEROL SULFATE HFA 108 (90 BASE) MCG/ACT IN AERS
2.0000 | INHALATION_SPRAY | Freq: Four times a day (QID) | RESPIRATORY_TRACT | 0 refills | Status: AC | PRN
  Filled 2024-03-27 (×2): qty 6.7, 30d supply, fill #0

## 2024-04-07 DIAGNOSIS — E282 Polycystic ovarian syndrome: Secondary | ICD-10-CM | POA: Diagnosis not present

## 2024-04-07 DIAGNOSIS — R748 Abnormal levels of other serum enzymes: Secondary | ICD-10-CM | POA: Diagnosis not present

## 2024-04-07 DIAGNOSIS — K760 Fatty (change of) liver, not elsewhere classified: Secondary | ICD-10-CM | POA: Diagnosis not present

## 2024-05-05 DIAGNOSIS — R7989 Other specified abnormal findings of blood chemistry: Secondary | ICD-10-CM | POA: Diagnosis not present

## 2024-05-08 ENCOUNTER — Other Ambulatory Visit (HOSPITAL_COMMUNITY): Payer: Self-pay

## 2024-05-08 DIAGNOSIS — K838 Other specified diseases of biliary tract: Secondary | ICD-10-CM | POA: Diagnosis not present

## 2024-05-08 DIAGNOSIS — Z4659 Encounter for fitting and adjustment of other gastrointestinal appliance and device: Secondary | ICD-10-CM | POA: Diagnosis not present

## 2024-05-08 DIAGNOSIS — K831 Obstruction of bile duct: Secondary | ICD-10-CM | POA: Diagnosis not present

## 2024-05-08 MED ORDER — CIPROFLOXACIN HCL 500 MG PO TABS
500.0000 mg | ORAL_TABLET | Freq: Two times a day (BID) | ORAL | 0 refills | Status: AC
  Filled 2024-05-08: qty 10, 5d supply, fill #0

## 2024-05-13 DIAGNOSIS — R1012 Left upper quadrant pain: Secondary | ICD-10-CM | POA: Diagnosis not present

## 2024-05-13 DIAGNOSIS — R7989 Other specified abnormal findings of blood chemistry: Secondary | ICD-10-CM | POA: Diagnosis not present

## 2024-05-21 DIAGNOSIS — K831 Obstruction of bile duct: Secondary | ICD-10-CM | POA: Diagnosis not present

## 2024-05-21 DIAGNOSIS — R1011 Right upper quadrant pain: Secondary | ICD-10-CM | POA: Diagnosis not present

## 2024-05-25 DIAGNOSIS — R1012 Left upper quadrant pain: Secondary | ICD-10-CM | POA: Diagnosis not present

## 2024-05-25 DIAGNOSIS — K831 Obstruction of bile duct: Secondary | ICD-10-CM | POA: Diagnosis not present

## 2024-05-25 DIAGNOSIS — R1084 Generalized abdominal pain: Secondary | ICD-10-CM | POA: Diagnosis not present

## 2024-05-25 DIAGNOSIS — R748 Abnormal levels of other serum enzymes: Secondary | ICD-10-CM | POA: Diagnosis not present

## 2024-05-25 DIAGNOSIS — R7989 Other specified abnormal findings of blood chemistry: Secondary | ICD-10-CM | POA: Diagnosis not present

## 2024-05-26 DIAGNOSIS — K831 Obstruction of bile duct: Secondary | ICD-10-CM | POA: Diagnosis not present

## 2024-05-26 DIAGNOSIS — R1011 Right upper quadrant pain: Secondary | ICD-10-CM | POA: Diagnosis not present

## 2024-06-04 DIAGNOSIS — R0602 Shortness of breath: Secondary | ICD-10-CM | POA: Diagnosis not present

## 2024-06-04 DIAGNOSIS — M7989 Other specified soft tissue disorders: Secondary | ICD-10-CM | POA: Diagnosis not present

## 2024-06-05 DIAGNOSIS — R0602 Shortness of breath: Secondary | ICD-10-CM | POA: Diagnosis not present

## 2024-06-05 DIAGNOSIS — M7989 Other specified soft tissue disorders: Secondary | ICD-10-CM | POA: Diagnosis not present

## 2024-06-16 ENCOUNTER — Other Ambulatory Visit (HOSPITAL_COMMUNITY): Payer: Self-pay

## 2024-06-16 ENCOUNTER — Encounter (HOSPITAL_COMMUNITY): Payer: Self-pay

## 2024-06-25 DIAGNOSIS — K838 Other specified diseases of biliary tract: Secondary | ICD-10-CM | POA: Diagnosis not present

## 2024-06-25 DIAGNOSIS — K7689 Other specified diseases of liver: Secondary | ICD-10-CM | POA: Diagnosis not present

## 2024-06-25 DIAGNOSIS — R1084 Generalized abdominal pain: Secondary | ICD-10-CM | POA: Diagnosis not present

## 2024-07-02 DIAGNOSIS — K754 Autoimmune hepatitis: Secondary | ICD-10-CM | POA: Diagnosis not present

## 2024-07-03 ENCOUNTER — Other Ambulatory Visit (HOSPITAL_COMMUNITY): Payer: Self-pay

## 2024-07-03 MED ORDER — AMOXICILLIN-POT CLAVULANATE 875-125 MG PO TABS
ORAL_TABLET | ORAL | 0 refills | Status: DC
  Filled 2024-07-03 – 2024-07-06 (×2): qty 10, 5d supply, fill #0

## 2024-07-06 ENCOUNTER — Other Ambulatory Visit (HOSPITAL_COMMUNITY): Payer: Self-pay

## 2024-07-14 DIAGNOSIS — E282 Polycystic ovarian syndrome: Secondary | ICD-10-CM | POA: Diagnosis not present

## 2024-07-14 DIAGNOSIS — K8301 Primary sclerosing cholangitis: Secondary | ICD-10-CM | POA: Diagnosis not present

## 2024-08-31 ENCOUNTER — Other Ambulatory Visit (HOSPITAL_COMMUNITY): Payer: Self-pay

## 2024-09-01 ENCOUNTER — Other Ambulatory Visit: Payer: Self-pay

## 2024-09-02 ENCOUNTER — Other Ambulatory Visit (HOSPITAL_COMMUNITY): Payer: Self-pay

## 2024-09-02 MED ORDER — ALPRAZOLAM 0.5 MG PO TABS
0.5000 mg | ORAL_TABLET | Freq: Every day | ORAL | 0 refills | Status: AC | PRN
  Filled 2024-09-02: qty 30, 30d supply, fill #0

## 2024-09-07 ENCOUNTER — Other Ambulatory Visit (HOSPITAL_COMMUNITY): Payer: Self-pay

## 2024-09-07 ENCOUNTER — Telehealth: Admitting: Physician Assistant

## 2024-09-07 DIAGNOSIS — J019 Acute sinusitis, unspecified: Secondary | ICD-10-CM | POA: Diagnosis not present

## 2024-09-07 DIAGNOSIS — B9689 Other specified bacterial agents as the cause of diseases classified elsewhere: Secondary | ICD-10-CM

## 2024-09-07 MED ORDER — AMOXICILLIN-POT CLAVULANATE 875-125 MG PO TABS
1.0000 | ORAL_TABLET | Freq: Two times a day (BID) | ORAL | 0 refills | Status: AC
  Filled 2024-09-07: qty 14, 7d supply, fill #0

## 2024-09-07 MED ORDER — FLUTICASONE PROPIONATE 50 MCG/ACT NA SUSP
2.0000 | Freq: Every day | NASAL | 0 refills | Status: AC
  Filled 2024-09-07: qty 16, 30d supply, fill #0

## 2024-09-07 NOTE — Progress Notes (Signed)

## 2024-10-02 ENCOUNTER — Ambulatory Visit: Admitting: Podiatry

## 2024-10-02 ENCOUNTER — Ambulatory Visit

## 2024-10-02 DIAGNOSIS — M79671 Pain in right foot: Secondary | ICD-10-CM

## 2024-10-02 DIAGNOSIS — S93621A Sprain of tarsometatarsal ligament of right foot, initial encounter: Secondary | ICD-10-CM | POA: Diagnosis not present

## 2024-10-02 NOTE — Progress Notes (Signed)
 Subjective: Chief Complaint  Patient presents with   Claudication    Patient presents today for R foot pain that started about 3 weeks ago when she tripped, has been experiencing hallux toe cramp since then, patient reports she hs been unable to walk long distance since incident.   44 year old female presents the office today with new concerns of pain.  She states that about 3 weeks ago she was at home and she tripped over totally and then she had the top of her foot.  No other injuries at the time and no recent treatment.  She has a history of a sprain in ankle 3 years ago that she states was bad and she was told she had a mass on the top of her foot at that time.  Objective: AAO x3, NAD DP/PT pulses palpable bilaterally, CRT less than 3 seconds There is tenderness palpation on the area at the Lisfranc joint dorsally as well as plantarly along the midfoot.  I am not able to appreciate any area of pinpoint tenderness but there is some localized edema there is no erythema or warmth.  I am not able to palpate any soft tissue mass or any calcification. No pain with calf compression, swelling, warmth, erythema  Assessment: Right foot sprain  Plan: -All treatment options discussed with the patient including all alternatives, risks, complications.  -X-rays obtained reviewed of the right foot.  Multiple views of the right foot were obtained.  I am not able to appreciate any evidence of acute fracture and no evidence of dislocation or subluxation. -Given her symptoms recommend mobilization in a cam boot. -I am not able to palpate any kind of mass today but if her symptoms persist would recommend getting an repeat MRI. -Continue ice, elevate. -Patient encouraged to call the office with any questions, concerns, change in symptoms.   Return for right foot pain in 3-4 weeks.  Karina [name] DPM

## 2024-10-30 ENCOUNTER — Ambulatory Visit: Admitting: Podiatry
